# Patient Record
Sex: Female | Born: 1960 | Race: Black or African American | Hispanic: No | Smoking: Never smoker
Health system: Southern US, Community
[De-identification: ages and names within clinical notes are randomized; demographics above are authoritative.]

## PROBLEM LIST (undated history)

## (undated) DIAGNOSIS — K219 Gastro-esophageal reflux disease without esophagitis: Secondary | ICD-10-CM

## (undated) DIAGNOSIS — F329 Major depressive disorder, single episode, unspecified: Secondary | ICD-10-CM

## (undated) DIAGNOSIS — M109 Gout, unspecified: Secondary | ICD-10-CM

## (undated) DIAGNOSIS — C649 Malignant neoplasm of unspecified kidney, except renal pelvis: Secondary | ICD-10-CM

## (undated) DIAGNOSIS — N289 Disorder of kidney and ureter, unspecified: Secondary | ICD-10-CM

## (undated) DIAGNOSIS — E119 Type 2 diabetes mellitus without complications: Secondary | ICD-10-CM

## (undated) DIAGNOSIS — F32A Depression, unspecified: Secondary | ICD-10-CM

## (undated) DIAGNOSIS — M1990 Unspecified osteoarthritis, unspecified site: Secondary | ICD-10-CM

## (undated) DIAGNOSIS — F419 Anxiety disorder, unspecified: Secondary | ICD-10-CM

## (undated) DIAGNOSIS — R011 Cardiac murmur, unspecified: Secondary | ICD-10-CM

## (undated) DIAGNOSIS — I10 Essential (primary) hypertension: Secondary | ICD-10-CM

## (undated) HISTORY — PX: GASTRIC BYPASS: SHX52

## (undated) HISTORY — DX: Disorder of kidney and ureter, unspecified: N28.9

## (undated) HISTORY — DX: Gout, unspecified: M10.9

## (undated) HISTORY — PX: TENDON REPAIR: SHX5111

## (undated) HISTORY — DX: Malignant neoplasm of unspecified kidney, except renal pelvis: C64.9

---

## 2000-05-10 ENCOUNTER — Encounter: Admission: RE | Admit: 2000-05-10 | Discharge: 2000-05-10 | Payer: Self-pay | Admitting: Pulmonary Disease

## 2000-05-13 ENCOUNTER — Encounter: Admission: RE | Admit: 2000-05-13 | Discharge: 2000-05-13 | Payer: Self-pay | Admitting: Pulmonary Disease

## 2000-07-25 ENCOUNTER — Encounter: Payer: Self-pay | Admitting: Otolaryngology

## 2000-07-25 ENCOUNTER — Ambulatory Visit (HOSPITAL_COMMUNITY): Admission: RE | Admit: 2000-07-25 | Discharge: 2000-07-25 | Payer: Self-pay | Admitting: Otolaryngology

## 2001-01-21 ENCOUNTER — Emergency Department (HOSPITAL_COMMUNITY): Admission: EM | Admit: 2001-01-21 | Discharge: 2001-01-21 | Payer: Self-pay | Admitting: *Deleted

## 2001-01-21 ENCOUNTER — Encounter: Payer: Self-pay | Admitting: *Deleted

## 2001-09-10 ENCOUNTER — Ambulatory Visit (HOSPITAL_COMMUNITY): Admission: RE | Admit: 2001-09-10 | Discharge: 2001-09-10 | Payer: Self-pay | Admitting: Pulmonary Disease

## 2001-12-22 ENCOUNTER — Encounter: Admission: RE | Admit: 2001-12-22 | Discharge: 2002-03-22 | Payer: Self-pay | Admitting: Pulmonary Disease

## 2002-02-08 ENCOUNTER — Emergency Department (HOSPITAL_COMMUNITY): Admission: EM | Admit: 2002-02-08 | Discharge: 2002-02-08 | Payer: Self-pay | Admitting: Internal Medicine

## 2002-02-08 ENCOUNTER — Encounter: Payer: Self-pay | Admitting: Internal Medicine

## 2002-05-09 ENCOUNTER — Encounter: Admission: RE | Admit: 2002-05-09 | Discharge: 2002-05-09 | Payer: Self-pay | Admitting: Pulmonary Disease

## 2002-06-20 ENCOUNTER — Emergency Department (HOSPITAL_COMMUNITY): Admission: EM | Admit: 2002-06-20 | Discharge: 2002-06-21 | Payer: Self-pay | Admitting: Emergency Medicine

## 2002-11-05 ENCOUNTER — Ambulatory Visit (HOSPITAL_COMMUNITY): Admission: RE | Admit: 2002-11-05 | Discharge: 2002-11-05 | Payer: Self-pay | Admitting: Pulmonary Disease

## 2003-11-30 ENCOUNTER — Emergency Department (HOSPITAL_COMMUNITY): Admission: EM | Admit: 2003-11-30 | Discharge: 2003-11-30 | Payer: Self-pay | Admitting: Emergency Medicine

## 2004-03-18 ENCOUNTER — Emergency Department (HOSPITAL_COMMUNITY): Admission: EM | Admit: 2004-03-18 | Discharge: 2004-03-18 | Payer: Self-pay | Admitting: Family Medicine

## 2004-03-22 ENCOUNTER — Emergency Department (HOSPITAL_COMMUNITY): Admission: EM | Admit: 2004-03-22 | Discharge: 2004-03-22 | Payer: Self-pay | Admitting: Family Medicine

## 2004-04-13 ENCOUNTER — Emergency Department (HOSPITAL_COMMUNITY): Admission: EM | Admit: 2004-04-13 | Discharge: 2004-04-13 | Payer: Self-pay | Admitting: Family Medicine

## 2004-04-18 ENCOUNTER — Ambulatory Visit (HOSPITAL_COMMUNITY): Admission: RE | Admit: 2004-04-18 | Discharge: 2004-04-18 | Payer: Self-pay | Admitting: Internal Medicine

## 2004-04-19 ENCOUNTER — Ambulatory Visit (HOSPITAL_COMMUNITY): Admission: RE | Admit: 2004-04-19 | Discharge: 2004-04-19 | Payer: Self-pay | Admitting: Internal Medicine

## 2004-05-02 ENCOUNTER — Other Ambulatory Visit: Admission: RE | Admit: 2004-05-02 | Discharge: 2004-05-02 | Payer: Self-pay | Admitting: Obstetrics and Gynecology

## 2004-05-08 ENCOUNTER — Ambulatory Visit (HOSPITAL_COMMUNITY): Admission: RE | Admit: 2004-05-08 | Discharge: 2004-05-08 | Payer: Self-pay | Admitting: Internal Medicine

## 2004-05-10 ENCOUNTER — Ambulatory Visit: Payer: Self-pay | Admitting: Orthopedic Surgery

## 2004-05-11 ENCOUNTER — Ambulatory Visit (HOSPITAL_COMMUNITY): Admission: RE | Admit: 2004-05-11 | Discharge: 2004-05-11 | Payer: Self-pay | Admitting: Obstetrics and Gynecology

## 2004-06-12 ENCOUNTER — Ambulatory Visit: Payer: Self-pay | Admitting: Orthopedic Surgery

## 2004-06-28 ENCOUNTER — Ambulatory Visit: Payer: Self-pay | Admitting: Orthopedic Surgery

## 2004-07-24 ENCOUNTER — Encounter: Admission: RE | Admit: 2004-07-24 | Discharge: 2004-08-17 | Payer: Self-pay | Admitting: Orthopedic Surgery

## 2004-11-03 ENCOUNTER — Ambulatory Visit (HOSPITAL_COMMUNITY): Payer: Self-pay | Admitting: Oncology

## 2004-11-03 ENCOUNTER — Encounter: Admission: RE | Admit: 2004-11-03 | Discharge: 2004-11-03 | Payer: Self-pay | Admitting: Oncology

## 2004-11-03 ENCOUNTER — Encounter (HOSPITAL_COMMUNITY): Admission: RE | Admit: 2004-11-03 | Discharge: 2004-12-03 | Payer: Self-pay | Admitting: Oncology

## 2005-02-14 ENCOUNTER — Emergency Department (HOSPITAL_COMMUNITY): Admission: EM | Admit: 2005-02-14 | Discharge: 2005-02-14 | Payer: Self-pay | Admitting: Family Medicine

## 2005-05-11 ENCOUNTER — Encounter (HOSPITAL_COMMUNITY): Admission: RE | Admit: 2005-05-11 | Discharge: 2005-06-10 | Payer: Self-pay | Admitting: Oncology

## 2005-05-11 ENCOUNTER — Ambulatory Visit (HOSPITAL_COMMUNITY): Payer: Self-pay | Admitting: Oncology

## 2005-05-11 ENCOUNTER — Encounter: Admission: RE | Admit: 2005-05-11 | Discharge: 2005-05-11 | Payer: Self-pay | Admitting: Oncology

## 2005-05-21 ENCOUNTER — Other Ambulatory Visit: Admission: RE | Admit: 2005-05-21 | Discharge: 2005-05-21 | Payer: Self-pay | Admitting: Obstetrics and Gynecology

## 2005-05-22 ENCOUNTER — Ambulatory Visit (HOSPITAL_COMMUNITY): Admission: RE | Admit: 2005-05-22 | Discharge: 2005-05-22 | Payer: Self-pay | Admitting: Family Medicine

## 2005-06-15 ENCOUNTER — Encounter (INDEPENDENT_AMBULATORY_CARE_PROVIDER_SITE_OTHER): Payer: Self-pay | Admitting: *Deleted

## 2005-06-15 ENCOUNTER — Ambulatory Visit (HOSPITAL_COMMUNITY): Admission: RE | Admit: 2005-06-15 | Discharge: 2005-06-15 | Payer: Self-pay | Admitting: Obstetrics and Gynecology

## 2006-06-05 ENCOUNTER — Emergency Department (HOSPITAL_COMMUNITY): Admission: EM | Admit: 2006-06-05 | Discharge: 2006-06-05 | Payer: Self-pay | Admitting: Family Medicine

## 2006-09-19 ENCOUNTER — Emergency Department (HOSPITAL_COMMUNITY): Admission: EM | Admit: 2006-09-19 | Discharge: 2006-09-19 | Payer: Self-pay | Admitting: Family Medicine

## 2006-12-02 ENCOUNTER — Emergency Department (HOSPITAL_COMMUNITY): Admission: EM | Admit: 2006-12-02 | Discharge: 2006-12-02 | Payer: Self-pay | Admitting: Emergency Medicine

## 2007-02-05 ENCOUNTER — Ambulatory Visit: Payer: Self-pay | Admitting: Cardiology

## 2007-02-18 ENCOUNTER — Encounter: Payer: Self-pay | Admitting: Cardiology

## 2007-02-18 ENCOUNTER — Ambulatory Visit: Payer: Self-pay

## 2007-02-20 ENCOUNTER — Ambulatory Visit: Payer: Self-pay

## 2007-02-24 ENCOUNTER — Ambulatory Visit: Payer: Self-pay | Admitting: Cardiology

## 2007-06-21 ENCOUNTER — Emergency Department (HOSPITAL_COMMUNITY): Admission: EM | Admit: 2007-06-21 | Discharge: 2007-06-21 | Payer: Self-pay | Admitting: Family Medicine

## 2008-05-11 ENCOUNTER — Ambulatory Visit (HOSPITAL_COMMUNITY): Admission: RE | Admit: 2008-05-11 | Discharge: 2008-05-11 | Payer: Self-pay | Admitting: Family Medicine

## 2009-01-24 ENCOUNTER — Ambulatory Visit (HOSPITAL_COMMUNITY): Admission: RE | Admit: 2009-01-24 | Discharge: 2009-01-24 | Payer: Self-pay | Admitting: Family Medicine

## 2009-08-09 ENCOUNTER — Ambulatory Visit (HOSPITAL_COMMUNITY): Admission: RE | Admit: 2009-08-09 | Discharge: 2009-08-09 | Payer: Self-pay | Admitting: Family Medicine

## 2009-08-12 ENCOUNTER — Emergency Department (HOSPITAL_COMMUNITY): Admission: EM | Admit: 2009-08-12 | Discharge: 2009-08-12 | Payer: Self-pay | Admitting: Emergency Medicine

## 2009-11-15 ENCOUNTER — Ambulatory Visit (HOSPITAL_COMMUNITY): Admission: RE | Admit: 2009-11-15 | Discharge: 2009-11-15 | Payer: Self-pay | Admitting: Family Medicine

## 2009-11-17 ENCOUNTER — Ambulatory Visit (HOSPITAL_COMMUNITY): Admission: RE | Admit: 2009-11-17 | Discharge: 2009-11-17 | Payer: Self-pay | Admitting: Family Medicine

## 2010-01-22 ENCOUNTER — Emergency Department (HOSPITAL_COMMUNITY): Admission: EM | Admit: 2010-01-22 | Discharge: 2010-01-22 | Payer: Self-pay | Admitting: Family Medicine

## 2010-04-22 ENCOUNTER — Encounter: Payer: Self-pay | Admitting: Internal Medicine

## 2010-04-23 ENCOUNTER — Encounter: Payer: Self-pay | Admitting: Family Medicine

## 2010-08-15 NOTE — Assessment & Plan Note (Signed)
[facility]                            CARDIOLOGY OFFICE NOTE   NAME:April Ward, April Ward                      MRN:          [id number]  DATE:02/24/2007                            DOB:          11/16/1960    PRIMARY CARE PHYSICIAN:  Annia Friendly. Loleta Chance, M.D.   REASON FOR VISIT:  Follow-up cardiac testing.   HISTORY OF PRESENT ILLNESS:  I saw this patient back in early November.  She was referred at that time with an abnormal electrocardiogram and had  other cardiac risk factors including obesity, hypertension, and type 2  diabetes mellitus.  I referred her for baseline ischemic evaluation  including an Adenosine Myoview which demonstrated no active ischemia  with soft tissue attenuation and normal ejection fraction of 61%.  She  also had an echocardiogram in the presence of a cardiac murmur  demonstrating normal left ventricular systolic function with some  dynamic left ventricular outflow tract obstruction with Valsalva likely  contributing to her murmur as well as mild calcification of the aortic  valve, although without any stenosis.  I reviewed these studies with her  today.  She is not reporting any active symptoms of chest pain or  breathlessness and risk factor modification makes the most sense at this  time.   ALLERGIES:  SCOLAXIN.   CURRENT MEDICATIONS:  1. Glyburide Metformin 5/500 mg one tablet p.o. b.i.d.  2. Prilosec 20 mg p.o. daily.  3. Tekturna 300/12.5 mg p.o. daily.  4. Benicar HCT 40/25 mg p.o. daily.  5. Aspirin 81 mg p.o. daily.   REVIEW OF SYSTEMS:  As described in history of present illness.  Otherwise negative.   PHYSICAL EXAMINATION:   PHYSICAL EXAMINATION:  VITAL SIGNS:  Blood pressure 184/96, heart rate  75, weight is 317 pounds.  GENERAL:  This is a morbidly obese woman in no acute distress.  NECK:  No elevated jugular venous pressure.  LUNGS:  Clear.  HEART:  Regular rate and rhythm with 2/6 systolic murmur at right  base.  Preserved S2.  No S3 gallop.  EXTREMITIES:  No pitting edema.   IMPRESSION:  Recent reassuring cardiac testing including no active  ischemia by Myoview and normal left ventricular systolic function.  Her  cardiac murmur is likely explained by some dynamic left ventricular  outflow tract obstruction and also a mild degree of calcification of the  aortic valve, although, without any significant aortic stenosis.  This  can be followed clinically.  Her abnormal electrocardiogram is likely  nonspecific and she does not have frank evidence of anterior scar.  At  this point I would recommend aggressive risk factor modification  including better blood pressure control.  She will follow up with Dr.  Loleta Chance and we can certainly see her back as needed.     Jonelle Sidle, MD  Electronically Signed    SGM/MedQ  DD: 02/24/2007  DT: 02/24/2007  Job #: [id number]   cc:   Annia Friendly. Loleta Chance, MD

## 2010-08-15 NOTE — Assessment & Plan Note (Signed)
[facility]                            CARDIOLOGY OFFICE NOTE   NAME:April Ward, April Ward                      MRN:          [id number]  DATE:02/05/2007                            DOB:          1960/05/06    REQUESTING PHYSICIAN:  Dr. Mirna Mires.   REASON FOR CONSULTATION:  Abnormal electrocardiogram.   HISTORY OF PRESENT ILLNESS:  [name] is a pleasant 50 year old woman  with history of obesity, hypertension, and type-2 diabetes mellitus. She  states that she has been undergoing medication adjustments for her blood  pressure per Dr. Loleta Chance. Back on September 1st, she reportedly went to an  urgent care facility with elevated blood pressure, and at that time, she  had an electrocardiogram obtained that was abnormal. I have a tracing  dated September 1, which shows sinus rhythm with poor anterior R-wave  progression, raising the possibility of a prior anterior wall  infarction. The patient tells me now that she was not having any  symptoms of chest pain at that time, although she was referred to the  emergency department for further evaluation. Available lab work  indicates a normal troponin-I level and a normal CK-NB level from that  visit. She was subsequently seen by Dr. Loleta Chance, and referred to [place] today  to discuss the situation further.   [name] denies having any exertional chest pain or breathlessness.  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  Her electrocardiogram shows sinus rhythm with a prolonged PR interval of  210 milliseconds and poor R-wave progression anteriorly as noted  previously. She otherwise has non-specific T-wave changes. [name]  has not undergone any prior stress testing. She does report some family  history of cardiovascular disease stating that her mother underwent  bypass surgery in her 57s, and that her father died of a heart attack at  age 79.   ALLERGIES:  SKELAXIN.   CURRENT  MEDICATIONS:  1. Glyburide.  2. Metformin 5/500 mg 1 tablet b.i.d.  3. Omeprazole 20 mg daily.  4. Tekturna 300/12.5 mg daily.  5. Benicar hydrochlorothiazide 40/25 mg daily.  6. Aspirin 81 mg daily.   PAST MEDICAL HISTORY:  As outlined above. The patient reports having a  fairly long standing history of hypertension and type-2 diabetes  mellitus. She has never been on any medications for cholesterol. She has  had two prior Caesarean sections in 1984 and in 1993.   FAMILY HISTORY:  As reviewed above.   REVIEW OF SYSTEMS:  As described in the history of present illness. The  patient reports problems with seasonal allergies. She has had a previous  diagnosis of anemia. She does report reflux symptoms. Also, arthritic  pain. No palpitations or syncope. No orthopnea or PND.   SOCIAL HISTORY:  The patient is divorced and has two children. She is an  LPN. She denies any alcohol or tobacco use history. She drinks 1 or 2  caffeinated beverages a day. She does not exercise regularly.   PHYSICAL EXAMINATION:  VITAL SIGNS:  Weight 314 pounds, blood pressure  180/106, recheck  at 170/94. Heart rate 68.  GENERAL:  This is a morbidly obese woman in no acute distress without  any active symptoms of chest pain or breathlessness.  HEENT:  Conjunctivae and lids normal. Oropharynx is clear.  NECK:  Supple. No elevated jugular venous pressure or loud bruits. No  thyromegaly is noted.  LUNGS:  Clear without labored breathing at rest.  CARDIAC:  Regular rate and rhythm. There is a 2/6 systolic murmur noted  at the base. Second heart sound is preserved. No S3 gallop or  pericardial rub.  ABDOMEN:  Obese. I am unable to adequately palpate liver margin. Bowel  sounds are present. No tenderness noted.  EXTREMITIES:  Exhibit no pitting edema. Distal pulses are 1+.  SKIN:  Warm and dry.  MUSCULOSKELETAL:  No kyphosis is noted.  NEUROPSYCHIATRIC:  The patient is alert and oriented x3. Affect is   normal.   IMPRESSION/RECOMMENDATIONS:  1. Abnormal electrocardiogram with poor R-wave progression raising the      possibility of previous anterior wall infarction in a 50 year old      morbidly obese woman with hypertension, type-2 diabetes mellitus,      and some family history of cardiovascular disease. This is in the      absence, however, of any active chest pain or breathlessness. Her      abnormal EKG may be due to body habitus. We discussed these issues      today and I have recommended further risk stratification including      an echocardiogram, particularly in light of her cardiac murmur to      assess valvular function, as well as cardiac structure. We will      proceed with a two day adenosine Myoview as well to assess her      ischemia and/or scar. I will have her follow up in the office over      the next few weeks to discuss the results.  2. Further plan is to follow up.     Jonelle Sidle, MD  Electronically Signed    SGM/MedQ  DD: 02/05/2007  DT: 02/06/2007  Job #: [id number]   cc:   Annia Friendly. Loleta Chance, MD

## 2010-08-18 NOTE — Op Note (Signed)
NAME:  April Ward, April Ward               ACCOUNT NO.:  [id number]   MEDICAL RECORD NO.:  [id number]          PATIENT TYPE:  AMB   LOCATION:  SDC                           FACILITY:  [facility]   PHYSICIAN:  Naima A. Dillard, M.D. DATE OF BIRTH:  October 13, 1960   DATE OF PROCEDURE:  06/15/2005  DATE OF DISCHARGE:                                 OPERATIVE REPORT   PREOPERATIVE DIAGNOSIS:  Menorrhagia and anemia.   POSTOPERATIVE DIAGNOSIS:  Menorrhagia and anemia.   OPERATION/PROCEDURE:  1.  Dilatation and curettage and hysteroscopy.  2.  NovaSure ablation.   SURGEON:  Naima A. Normand Sloop, M.D.   ANESTHESIA:  General laryngeal masked airway.   SPECIMENS:  Endometrial curettings.   ESTIMATED BLOOD LOSS:  Minimal.   COMPLICATIONS:  None.   DISPOSITION:  The patient went to PACU in stable condition.   DESCRIPTION OF PROCEDURE:  The patient was taken to the operating room where  she was given general anesthesia with laryngeal masked airway, placed in the  dorsal lithotomy position and prepped and draped in the normal sterile  fashion.  A bivalve speculum was placed into the vagina.  The anterior lip  of the cervix grasped with a single-tooth tenaculum and 20 mL 1% lidocaine  was used for local anesthesia for cervical block.  The cervical length was  found to be 4.5 cm.  The sounding length was 10 cm given the cavity length  of 5.5 cm.  Cervix was further dilated with Shawnie Pons dilators which was 21.  The hysteroscope was placed into the uterine cavity.  There were no polyps  or submucosal fibroids noted on the cervix.  Lower uterine segments were  anywhere  in the uterus. Both ostial were visualized.  The patient just had  abundant fluffy endometria.  The hysteroscope was removed.  A sharp  curettage was then done and an abundant  amount of endometrial curettings  were obtained and sent to pathology.  The NovaSure was then placed into the  uterus and seated.  The cavity was width was set at 5.5.  It was  seated.  The width was 3.5 cm.  The seal was then checked and noted to be secure.  The NovaSure ablation lasted for one minute 45 seconds.  The NovaSure was  removed.  The hysteroscope was replaced into the uterine cavity and the  ablation  appeared successful.  All instruments were removed from the vagina and  cervix.  There were some bleeding from the patient's right side tenaculum  site which was made hemostatic with pressure and silver nitrate.  Sponge,  lap and needle counts were correct x2.  The patient went to the recovery  room in stable condition.      Naima A. Normand Sloop, M.D.  Electronically Signed     NAD/MEDQ  D:  06/15/2005  T:  06/16/2005  Job:  [id number]

## 2010-08-18 NOTE — H&P (Signed)
NAME:  April Ward, April Ward               ACCOUNT NO.:  [id number]   MEDICAL RECORD NO.:  [id number]          PATIENT TYPE:  AMB   LOCATION:  SDC                           FACILITY:  [facility]   PHYSICIAN:  Naima A. Dillard, M.D. DATE OF BIRTH:  1960-07-12   DATE OF ADMISSION:  DATE OF DISCHARGE:                                HISTORY & PHYSICAL   DIAGNOSIS:  Menorrhagia.   HISTORY OF PRESENT ILLNESS:  The patient is a 50 year old African American  female who presents complaining of heavy periods.  Menarche started at age  38. Her periods come every month and they last for four to five days.  She  soaks a pad every hour on the first couple of days.  She did not have any  chest pain or dizziness. The patient had a hemoglobin of 9.5.  hemoglobin  electrophoresis was normal and the hemoccult was also normal.  The patient  denies being on any contraception.  She denies any history of fibroids or  hormone therapy.  She is not on any new medications.  Denies any vaginal  discharge, menopausal symptoms, abdominal pain, or any increased stress.   PAST MEDICAL HISTORY:  1.  Microcytic anemia.  2.  Diabetes mellitus.  3.  Obesity.  4.  Gastroesophageal reflux disease.  5.  Chronic hypertension.   MEDICATIONS:  1.  Glucovance.  2.  Calan.  3.  Labetalol.   PAST SURGICAL HISTORY:  1.  Cesarean section x1.  2.  Tubal ligation.   GYN HISTORY:  Menarche occurred at age 62, occurring every month, lasting  for four to five days.  The patient denies any history of abnormal Pap  smear.  Denies any history of sexually transmitted diseases.   SOCIAL HISTORY:  Negative for tobacco, alcohol or drug use.  She does  exercise occasionally.  The patient has complained of this menorrhagia since  January 2006.   ALLERGIES:  SKELAXIN, FLEXERIL.   REVIEW OF SYSTEMS:  GENITOURINARY:  As above.  MUSCULOSKELETAL:  Unremarkable.  GI: Significant for gastroesophageal reflux.  CARDIOVASCULAR:  Significant for  hypertension.  PSYCHIATRIC:  Unremarkable.   PHYSICAL EXAMINATION:  VITAL SIGNS:  Blood pressure 150/82.  She weighs 302  pounds.  HEENT:  Pupils are equal.  Hearing is normal.  Throat is clear.  NECK:  Thyroid is not enlarged.  HEART:  Regular rate and rhythm.  CHEST:  Clear to auscultation bilaterally.  BREASTS:  No masses, discharge, skin changes, or nipple retraction  bilaterally.  ABDOMEN:  Obese, soft, nontender.  EXTREMITIES:  No cyanosis, clubbing or edema.  NEUROLOGIC:  Within normal limits.  Vaginal exam:  Within normal limits.  Cervix is nontender without any  lesions.  Uterus is difficult to tell secondary body habitus.  Adnexa have  no masses and nontender.  Rectovaginal exam within normal limits.   LABORATORY DATA:  Hemoglobin 9.5 in the office.   ASSESSMENT:  Menorrhagia.  All treatments were reviewed with the patient in  detail.  For menorrhagia, the patient has decided to go with [facility] and  hysteroscopy and NovaSure ablation.  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  The  patient understands the risks, and assessment is menorrhagia.   PLAN:  D&C, hysteroscopy and NovaSure ablation.  The patient understands the  risks to be not limited to bleeding, infection, damage to internal organs  such as bowel, bladder and major blood vessels.      Naima A. Normand Sloop, M.D.  Electronically Signed     NAD/MEDQ  D:  06/14/2005  T:  06/15/2005  Job:  [id number]

## 2011-01-12 LAB — POCT CARDIAC MARKERS
CKMB, poc: 2.8
Myoglobin, poc: 156
Operator id: 270111
Troponin i, poc: <0.05

## 2011-02-08 ENCOUNTER — Other Ambulatory Visit (HOSPITAL_COMMUNITY): Payer: Self-pay | Admitting: Family Medicine

## 2011-02-08 DIAGNOSIS — Z1231 Encounter for screening mammogram for malignant neoplasm of breast: Secondary | ICD-10-CM

## 2011-03-12 ENCOUNTER — Ambulatory Visit (HOSPITAL_COMMUNITY): Payer: BC Managed Care – PPO | Attending: Family Medicine

## 2011-12-17 ENCOUNTER — Other Ambulatory Visit (HOSPITAL_COMMUNITY): Payer: Self-pay | Admitting: Family Medicine

## 2011-12-17 DIAGNOSIS — Z1231 Encounter for screening mammogram for malignant neoplasm of breast: Secondary | ICD-10-CM

## 2011-12-17 DIAGNOSIS — Z139 Encounter for screening, unspecified: Secondary | ICD-10-CM

## 2011-12-18 ENCOUNTER — Ambulatory Visit (HOSPITAL_COMMUNITY)
Admission: RE | Admit: 2011-12-18 | Discharge: 2011-12-18 | Disposition: A | Discharge status: Home / Self Care | Payer: BC Managed Care – PPO | Source: Ambulatory Visit | Attending: Family Medicine | Admitting: Family Medicine

## 2011-12-18 DIAGNOSIS — Z139 Encounter for screening, unspecified: Secondary | ICD-10-CM

## 2011-12-18 DIAGNOSIS — Z1231 Encounter for screening mammogram for malignant neoplasm of breast: Secondary | ICD-10-CM | POA: Insufficient documentation

## 2012-01-01 ENCOUNTER — Ambulatory Visit (HOSPITAL_COMMUNITY): Payer: Self-pay

## 2012-05-06 ENCOUNTER — Ambulatory Visit (HOSPITAL_COMMUNITY)
Admission: RE | Admit: 2012-05-06 | Discharge: 2012-05-06 | Disposition: A | Discharge status: Home / Self Care | Payer: BC Managed Care – PPO | Source: Ambulatory Visit | Attending: Family Medicine | Admitting: Family Medicine

## 2012-05-06 ENCOUNTER — Other Ambulatory Visit (HOSPITAL_COMMUNITY): Payer: Self-pay | Admitting: Family Medicine

## 2012-05-06 DIAGNOSIS — M25561 Pain in right knee: Secondary | ICD-10-CM

## 2012-05-06 DIAGNOSIS — W19XXXA Unspecified fall, initial encounter: Secondary | ICD-10-CM

## 2012-05-06 DIAGNOSIS — S8990XA Unspecified injury of unspecified lower leg, initial encounter: Secondary | ICD-10-CM | POA: Insufficient documentation

## 2012-05-06 DIAGNOSIS — M898X9 Other specified disorders of bone, unspecified site: Secondary | ICD-10-CM | POA: Insufficient documentation

## 2012-05-06 DIAGNOSIS — S99929A Unspecified injury of unspecified foot, initial encounter: Secondary | ICD-10-CM | POA: Insufficient documentation

## 2012-05-06 DIAGNOSIS — X58XXXA Exposure to other specified factors, initial encounter: Secondary | ICD-10-CM | POA: Insufficient documentation

## 2012-05-06 DIAGNOSIS — M25569 Pain in unspecified knee: Secondary | ICD-10-CM | POA: Insufficient documentation

## 2012-07-13 ENCOUNTER — Emergency Department (HOSPITAL_COMMUNITY)
Admission: EM | Admit: 2012-07-13 | Discharge: 2012-07-13 | Disposition: A | Discharge status: Home / Self Care | Payer: BC Managed Care – PPO | Attending: Emergency Medicine | Admitting: Emergency Medicine

## 2012-07-13 ENCOUNTER — Encounter (HOSPITAL_COMMUNITY): Payer: Self-pay | Admitting: Emergency Medicine

## 2012-07-13 DIAGNOSIS — Z79899 Other long term (current) drug therapy: Secondary | ICD-10-CM | POA: Insufficient documentation

## 2012-07-13 DIAGNOSIS — E119 Type 2 diabetes mellitus without complications: Secondary | ICD-10-CM | POA: Insufficient documentation

## 2012-07-13 DIAGNOSIS — I10 Essential (primary) hypertension: Secondary | ICD-10-CM | POA: Insufficient documentation

## 2012-07-13 DIAGNOSIS — M25569 Pain in unspecified knee: Secondary | ICD-10-CM | POA: Insufficient documentation

## 2012-07-13 DIAGNOSIS — Z87828 Personal history of other (healed) physical injury and trauma: Secondary | ICD-10-CM | POA: Insufficient documentation

## 2012-07-13 DIAGNOSIS — M25561 Pain in right knee: Secondary | ICD-10-CM

## 2012-07-13 HISTORY — DX: Type 2 diabetes mellitus without complications: E11.9

## 2012-07-13 HISTORY — DX: Essential (primary) hypertension: I10

## 2012-07-13 MED ORDER — OXYCODONE-ACETAMINOPHEN 5-325 MG PO TABS
2.0000 | ORAL_TABLET | Freq: Once | ORAL | Status: AC
  Administered 2012-07-13: 2 via ORAL
  Filled 2012-07-13: qty 2

## 2012-07-13 MED ORDER — OXYCODONE-ACETAMINOPHEN 5-325 MG PO TABS: 2.0000 | ORAL_TABLET | ORAL | Status: DC | PRN

## 2012-07-13 NOTE — ED Provider Notes (Signed)
History     CSN: [id number]  Arrival date & time 07/13/12  4098   First MD Initiated Contact with Patient 07/13/12 [phone]      Chief Complaint  Patient presents with  . Knee Pain    (Consider location/radiation/quality/duration/timing/severity/associated sxs/prior treatment) HPI Comments: Patient injured right knee two months ago doing Zumba.  Had xrays which showed osteophyte formation.  Didn't get better, had mri last week and hasn't received the results.  Pain worsening.  Patient is a 52 y.o. female presenting with knee pain. The history is provided by the patient.  Knee Pain Location:  Knee Time since incident:  2 months Pain details:    Quality:  Aching   Radiates to:  Does not radiate   Severity:  Severe   Onset quality:  Sudden   Duration:  2 months   Timing:  Constant   Progression:  Worsening Chronicity:  New   Past Medical History  Diagnosis Date  . Hypertension   . Diabetes mellitus without complication     Past Surgical History  Procedure Laterality Date  . Cesarean section  1993    History reviewed. No pertinent family history.  History  Substance Use Topics  . Smoking status: Never Smoker   . Smokeless tobacco: Not on file  . Alcohol Use: No    OB History   Grav Para Term Preterm Abortions TAB SAB Ect Mult Living                  Review of Systems  All other systems reviewed and are negative.    Allergies  Skelaxin  Home Medications   Current Outpatient Rx  Name  Route  Sig  Dispense  Refill  . aliskiren (TEKTURNA) 300 MG tablet   Oral   Take 300 mg by mouth daily.         . metFORMIN (GLUCOPHAGE) 500 MG tablet   Oral   Take 500 mg by mouth once.           BP 164/87  Pulse 58  Temp(Src) 97.8 F (36.6 C) (Oral)  Resp 18  SpO2 100%  Physical Exam  Nursing note and vitals reviewed. Constitutional: She is oriented to person, place, and time. She appears well-developed and well-nourished. No distress.  HENT:  Head:  Normocephalic and atraumatic.  Mouth/Throat: Oropharynx is clear and moist.  Neck: Normal range of motion. Neck supple.  Musculoskeletal:  The right knee appears grossly normal.  There is no effusion or deformity.  There is pain with range of motion.  The knee is stable ap and laterally.  Neurological: She is alert and oriented to person, place, and time.  Skin: Skin is warm and dry. She is not diaphoretic.    ED Course  Procedures (including critical care time)  Labs Reviewed - No data to display No results found.   No diagnosis found.    MDM  Likely djd flareup.  Will prescribe percocet.  She has already had her mri and can call Monday for results.          Sudie Grumbling, MD 07/13/12 [phone]

## 2012-07-13 NOTE — ED Notes (Signed)
Pt complains of right knee pain, states she injured it while exercising back in feb of this year, has had xrays here and had an MRI Friday but still does not know what is causing the pain, states she can barely put weight on it. No obvious swelling or deformity noted.

## 2012-07-22 ENCOUNTER — Telehealth: Payer: Self-pay | Admitting: Orthopedic Surgery

## 2012-07-22 NOTE — Telephone Encounter (Signed)
Disregard message - testing MyChart appt reminder.

## 2012-07-23 ENCOUNTER — Encounter: Payer: Self-pay | Admitting: Orthopedic Surgery

## 2012-07-23 ENCOUNTER — Ambulatory Visit (INDEPENDENT_AMBULATORY_CARE_PROVIDER_SITE_OTHER): Payer: BC Managed Care – PPO | Admitting: Orthopedic Surgery

## 2012-07-23 VITALS — BP 206/102 | Ht 71.5 in | Wt 286.0 lb

## 2012-07-23 DIAGNOSIS — M1710 Unilateral primary osteoarthritis, unspecified knee: Secondary | ICD-10-CM

## 2012-07-23 DIAGNOSIS — M179 Osteoarthritis of knee, unspecified: Secondary | ICD-10-CM | POA: Insufficient documentation

## 2012-07-23 DIAGNOSIS — M23329 Other meniscus derangements, posterior horn of medial meniscus, unspecified knee: Secondary | ICD-10-CM

## 2012-07-23 DIAGNOSIS — M23321 Other meniscus derangements, posterior horn of medial meniscus, right knee: Secondary | ICD-10-CM

## 2012-07-23 DIAGNOSIS — IMO0002 Reserved for concepts with insufficient information to code with codable children: Secondary | ICD-10-CM

## 2012-07-23 MED ORDER — HYDROCODONE-ACETAMINOPHEN 7.5-325 MG PO TABS: 1.0000 | ORAL_TABLET | ORAL | Status: DC | PRN

## 2012-07-23 NOTE — Patient Instructions (Addendum)
Meniscus Injury of the Knee, Arthroscopy You may have an internal derangement of the knee. This means something is wrong inside the knee. Your caregiver can make a more accurate diagnosis (learning what is wrong) by performing an arthroscopic procedure. Your knee has two layers of cartilage. Articular cartilage covers the bone ends. It lets your knee bend and move smoothly. Two menisci (thick pads of cartilage that form a rim inside the joint) help absorb shock. They stabilize your knee. Ligaments bind the bones together. They support your knee joint. Muscles move the joint, help support your knee, and take stress off the joint itself.  ABOUT THE PROCEDURE Arthroscopy is a surgical technique. It allows your orthopedic surgeon to diagnose and treat your knee injury with accuracy. The surgeon looks into your knee through a small scope. The scope is like a small (pencil-sized) telescope. Arthroscopy is less invasive than open knee surgery. You can expect a more rapid recovery. Following your caregiver's instructions will help you recover rapidly and completely. Use crutches, rest, elevate, ice, and do knee exercises as instructed. The length of recovery depends on various factors. These factors include type of injury, age, physical condition, medical conditions, and your determination. How long you will be away from your normal activities will depend on what kind of knee problem you have. It will also depend on how much tissue is damaged. Rebuilding your muscles after arthroscopy helps ensure a full recovery. RECOVERY Recovery after a meniscus injury depends on how much meniscus is damaged. It also depends on whether or not you have damaged other knee tissue. With small tears, your recovery may take a couple weeks. Larger tears will take longer. Meniscus injuries can usually be treated during arthroscopy. If your injury is on the inner edge of the meniscus, your surgeon may trim the meniscus back to a smooth rim.  In other cases, your surgeon will try to repair a damaged meniscus with sutures (stitches). This may lengthen your rehabilitation. It may provide better long-term health by helping your knee retain its shock absorption abilities. Use crutches, limit weight bearing, rest, elevate, apply ice, and exercise your knee as instructed. If a brace is applied, use as directed. The length of recovery depends on various factors including type of injury, age, physical condition, other medical conditions, and your determination. Your caregiver will help with instructions for rehabilitation of your knee. HOME CARE INSTRUCTIONS  Use crutches and knee exercises as instructed.  Applying an ice pack to your operative site may help with discomfort. It may also keep the swelling down.  Only take over-the-counter or prescription medicines for pain, discomfort, inflammation (soreness)or fever as directed by your caregiver. You may use these only if your caregiver has not given medications that would interfere.  You may resume normal diet and activities as directed. SEEK MEDICAL ATTENTION IF:  There is increased bleeding (more than a small spot) from the wound.  You notice redness, swelling, or increasing pain in the wound.  Pus is coming from wound.  An unexplained oral temperature above 102 F (38.9 C) develops, or as your caregiver suggests.  You notice a foul smell coming from the wound or dressing. SEEK IMMEDIATE MEDICAL CARE IF:  You develop a rash.  You have difficulty breathing.  You have any allergic problems. Document Released: 03/16/2000 Document Revised: 06/11/2011 Document Reviewed: 06/02/2007 [facility] Patient Information 2013 [place], [place].  OOW  plan 1 week  sark med menisectomy pending BP normalizing   Arthroscopic Procedure, Knee An arthroscopic  procedure can find what is wrong with your knee. PROCEDURE Arthroscopy is a surgical technique that allows your orthopedic surgeon to  diagnose and treat your knee injury with accuracy. They will look into your knee through a small instrument. This is almost like a small (pencil sized) telescope. Because arthroscopy affects your knee less than open knee surgery, you can anticipate a more rapid recovery. Taking an active role by following your caregiver's instructions will help with rapid and complete recovery. Use crutches, rest, elevation, ice, and knee exercises as instructed. The length of recovery depends on various factors including type of injury, age, physical condition, medical conditions, and your rehabilitation. Your knee is the joint between the large bones (femur and tibia) in your leg. Cartilage covers these bone ends which are smooth and slippery and allow your knee to bend and move smoothly. Two menisci, thick, semi-lunar shaped pads of cartilage which form a rim inside the joint, help absorb shock and stabilize your knee. Ligaments bind the bones together and support your knee joint. Muscles move the joint, help support your knee, and take stress off the joint itself. Because of this all programs and physical therapy to rehabilitate an injured or repaired knee require rebuilding and strengthening your muscles. AFTER THE PROCEDURE  After the procedure, you will be moved to a recovery area until most of the effects of the medication have worn off. Your caregiver will discuss the test results with you.   Only take over-the-counter or prescription medicines for pain, discomfort, or fever as directed by your caregiver.    You have been scheduled for arthroscocpic knee surgery.  All surgeries carry some risk.  Remember you always have the option of continued nonsurgical treatment. However in this situation the risks vs. the benefits favor surgery as the best treatment option. The risks of the surgery includes the following but is not limited to bleeding, infection, pulmonary embolus, death from anesthesia, nerve injury vascular  injury or need for further surgery, continued pain.  Specific to this procedure the following risks and complications are rare but possible Stiffness, pain, weakness, giving out  I expect  recovery will be in 3-4 weeks some patients take 6 weeks.  You  will need physical therapy after the procedure  Stop any blood thinning medication: such as warfarin, coumadin, naprosyn, ibuprofen, advil, diclofenac, aspirin

## 2012-07-23 NOTE — Progress Notes (Signed)
Patient ID: April Ward, female   DOB: [birth year], 52 y.o.   MRN: [id number] Chief Complaint  Patient presents with  . Knee Pain    Severe right knee pain d/ t injury    BP 206/102  Ht 5' 11.5" (1.816 m)  Wt 286 lb (129.729 kg)  BMI 39.34 kg/m2  Patient history referral by Dr. Mirna Mires  52 year old female started having pain in her right knee in February 2014. She participates in some classes but doesn't recall a specific injury. She had some aches and pains in the knee prior to some classes and sometimes her knee would feel bad afterwards  She did take some pain medication from the emergency room after a visit related to her knee. Her x-ray show degenerative changes he eventually went to have MRI. She complains of sharp throbbing 10 out of 10 constant pain worse with twisting turning driving walking slightly improved with ice she notes swelling and giving way with crepitance  Review of systems negative except for the following morning of the eyes, heart murmur, snoring, unsteady gait, anxiety, seasonal allergy. 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  Ambulation altered by the pain in the right knee  Right knee  Inspection reveals that there is tenderness over the medial compartment crepitus in the patellofemoral joint with pain with patellar compression. Joint effusion mild. Range of motion remains normal. Ligaments are stable motor exam is normal skin is intact  Right knee McMurray sign positive Left knee Inspection reveals that there is no evidence of tenderness or swelling  no mass no effusion, range of motion remains full without contracture. No crepitation is felt. The collateral ligaments and cruciate ligaments are stable. Muscle strength and muscle tone are normal  Left knee McMurray sign negative Skin is normal in both knees without caf au lait spots scars or rashes  Deep tendon reflexes are 2+ without pathologic reflexes  Sensation remains normal bilaterally  Upper extremity exam  The right and left upper extremity:   Inspection and palpation revealed no abnormalities in the upper extremities.   Range of motion is full without contracture.  Motor exam is normal with grade 5 strength.  The joints are fully reduced without subluxation.  There is no atrophy or tremor and muscle tone is normal.  All joints are stable.   We took her blood pressure twice was elevated both time she says she did take her medication at night as normal  We called Dr. Adaline Sill office to get this evaluated prior to surgery  We have discussed and she is agreed to arthroscopic surgery of the right knee for partial medial meniscectomy  We discussed her being out of work for 4 weeks she says she can do work from home and can go to work and use a wheelchair for needed but she will agree to one week out of work  OA (osteoarthritis) of knee  Medial meniscus, posterior horn derangement, right  Surgical arthroscopy right knee partial medial meniscectomy after blood pressure has been stabilized

## 2012-08-05 ENCOUNTER — Telehealth: Payer: Self-pay | Admitting: Orthopedic Surgery

## 2012-08-05 NOTE — Telephone Encounter (Signed)
The swimming, water aerobics

## 2012-08-05 NOTE — Telephone Encounter (Signed)
April Ward is still seeing Dr. Loleta Chance for elevated blood pressure.  At her last visit you told her she needs knee surgery, but blood pressure was too high. She was doing Zumba but can't do that now and she is asking what type of exercises can she safely do that will not further injure her  Knee. She is wanting to do water aerobics if you think that will be ok.  Please advise.  Her # [phone]

## 2012-08-05 NOTE — Telephone Encounter (Signed)
Left a message to call back.

## 2012-08-05 NOTE — Telephone Encounter (Signed)
Advised of doctor's reply °

## 2012-08-06 ENCOUNTER — Other Ambulatory Visit: Payer: Self-pay | Admitting: Orthopedic Surgery

## 2012-08-13 ENCOUNTER — Telehealth: Payer: Self-pay | Admitting: Orthopedic Surgery

## 2012-08-13 NOTE — Telephone Encounter (Signed)
April Ward called this morning, said she has seen Dr. Loleta Chance and he has cleared her for surgery, and she is ready to schedule. I haven't seen anything from his office about this.  Have you? April Ward's # [phone]

## 2012-08-14 NOTE — Telephone Encounter (Signed)
Do you have Dr. Adaline Sill note clearing patient for surgery? Also, patient called and said her Insurance is changing June 1st, so she will need her surgery before then.

## 2012-08-14 NOTE — Telephone Encounter (Signed)
1. Call dr hills office need a note clearing for surgery  2 no guarantee if will be before June 1  3. precert for menisectomy (its already may 15) 4. Earliest we can do it is 23 rd if precert received

## 2012-08-15 ENCOUNTER — Other Ambulatory Visit: Payer: Self-pay | Admitting: *Deleted

## 2012-08-18 ENCOUNTER — Telehealth: Payer: Self-pay | Admitting: Orthopedic Surgery

## 2012-08-18 NOTE — Telephone Encounter (Signed)
Per nurse, surgery and pre-op appointment scheduled.  Refer to call to insurer 08/18/12 (no pre-authorization required). Patient aware of status.

## 2012-08-18 NOTE — Telephone Encounter (Signed)
Contacted insurer, [employer], ph# [phone], regarding out-patient surgery scheduled at [facility] 08/29/12, CPT 29881, 29880, ICD9 codes 717.2, 715.96. Per automated voice response syste, no pre-authorization required, confirmation reference # [id number]. Also verified coverage per automated system - effective 04/02/12 and active.  Also reached insurance representative, Bufford Lope, who confirmed no pre-authorization needed.

## 2012-08-19 ENCOUNTER — Other Ambulatory Visit: Payer: Self-pay

## 2012-08-19 ENCOUNTER — Ambulatory Visit (HOSPITAL_COMMUNITY)
Admission: RE | Admit: 2012-08-19 | Discharge: 2012-08-19 | Disposition: A | Discharge status: Home / Self Care | Payer: BC Managed Care – PPO | Source: Ambulatory Visit | Attending: Family Medicine | Admitting: Family Medicine

## 2012-08-19 ENCOUNTER — Encounter (HOSPITAL_COMMUNITY): Payer: Self-pay

## 2012-08-19 ENCOUNTER — Encounter (HOSPITAL_COMMUNITY): Payer: BC Managed Care – PPO

## 2012-08-19 ENCOUNTER — Encounter (HOSPITAL_COMMUNITY)
Admission: RE | Admit: 2012-08-19 | Discharge: 2012-08-19 | Disposition: A | Discharge status: Home / Self Care | Payer: BC Managed Care – PPO | Source: Ambulatory Visit | Attending: Orthopedic Surgery | Admitting: Orthopedic Surgery

## 2012-08-19 ENCOUNTER — Encounter (HOSPITAL_COMMUNITY): Payer: Self-pay | Admitting: Pharmacy Technician

## 2012-08-19 DIAGNOSIS — IMO0001 Reserved for inherently not codable concepts without codable children: Secondary | ICD-10-CM | POA: Insufficient documentation

## 2012-08-19 DIAGNOSIS — M25569 Pain in unspecified knee: Secondary | ICD-10-CM | POA: Insufficient documentation

## 2012-08-19 DIAGNOSIS — R269 Unspecified abnormalities of gait and mobility: Secondary | ICD-10-CM | POA: Insufficient documentation

## 2012-08-19 HISTORY — DX: Unspecified osteoarthritis, unspecified site: M19.90

## 2012-08-19 HISTORY — DX: Gastro-esophageal reflux disease without esophagitis: K21.9

## 2012-08-19 HISTORY — DX: Depression, unspecified: F32.A

## 2012-08-19 HISTORY — DX: Major depressive disorder, single episode, unspecified: F32.9

## 2012-08-19 HISTORY — DX: Cardiac murmur, unspecified: R01.1

## 2012-08-19 HISTORY — DX: Anxiety disorder, unspecified: F41.9

## 2012-08-19 LAB — BASIC METABOLIC PANEL
BUN: 29 mg/dL — ABNORMAL HIGH (ref 6–23)
CO2: 27 mEq/L (ref 19–32)
Calcium: 9.2 mg/dL (ref 8.4–10.5)
Chloride: 100 mEq/L (ref 96–112)
Creatinine, Ser: 2.34 mg/dL — ABNORMAL HIGH (ref 0.50–1.10)
GFR calc Af Amer: 27 mL/min — ABNORMAL LOW (ref >90)
GFR calc non Af Amer: 23 mL/min — ABNORMAL LOW (ref >90)
Glucose, Bld: 195 mg/dL — ABNORMAL HIGH (ref 70–99)
Potassium: 3.5 mEq/L (ref 3.5–5.1)
Sodium: 138 mEq/L (ref 135–145)

## 2012-08-19 LAB — SURGICAL PCR SCREEN
MRSA, PCR: NEGATIVE
Staphylococcus aureus: NEGATIVE

## 2012-08-19 LAB — HEMOGLOBIN AND HEMATOCRIT, BLOOD
HCT: 39 % (ref 36.0–46.0)
Hemoglobin: 12.5 g/dL (ref 12.0–15.0)

## 2012-08-19 NOTE — Progress Notes (Signed)
Physical Therapy Treatment Patient Details  Name: April Ward MRN: [id number] Date of Birth: September 16, 1960  Today's Date: 08/19/2012 Time: 1355-1420 PT Time Calculation (min): 25 min Visit#: 1 of 1  Gait 20 minutes  Subjective: Symptoms/Limitations Symptoms: Pt. states she is having a Rt knee scope done this friday. Pain Assessment Currently in Pain?: No/denies   Exercise/Treatments Mobility/Balance  Transfers Transfers: Sit to Stand;Stand to Sit Sit to Stand: 6: Modified independent (Device/Increase time) Stand to Sit: 6: Modified independent (Device/Increase time) Ambulation/Gait Ambulation/Gait: Yes Ambulation/Gait Assistance: 6: Modified independent (Device/Increase time) Ambulation Distance (Feet): 50 Feet Assistive device: Crutches Gait Pattern: Step-through pattern Gait velocity: slow, cautious Stairs: No (Pt instructed with stairs but did not perform)         Physical Therapy Assessment and Plan PT Assessment and Plan Clinical Impression Statement: crutches adjusted for height.  Pt .able to demonstrate appropriate gait technique and negotiation of crutches as instructed.   Pt declined trying steps as she does not have any, however was instructed in proper gait technique/sequence for 2 steps with 1 HR and 1 crutch.  Pt. able to verbalize understanding. PT Plan: Discharge; One treatment only for crutch training.     Problem List Patient Active Problem List   Diagnosis Date Noted  . OA (osteoarthritis) of knee 07/23/2012  . Medial meniscus, posterior horn derangement 07/23/2012    PT - End of Session Activity Tolerance: Patient tolerated treatment well General Behavior During Therapy: [facility] for tasks assessed/performed Cognition: WFL for tasks performed   Lurena Nida, PTA/CLT 08/19/2012, 2:38 PM

## 2012-08-19 NOTE — Progress Notes (Addendum)
08/19/12 1236  OBSTRUCTIVE SLEEP APNEA  Have you ever been diagnosed with sleep apnea through a sleep study? No  Do you snore loudly (loud enough to be heard through closed doors)?  1  Do you often feel tired, fatigued, or sleepy during the daytime? 0  Has anyone observed you stop breathing during your sleep? 0  Do you have, or are you being treated for high blood pressure? 1  BMI more than 35 kg/m2? 1  Age over 52 years old? 1  Neck circumference greater than 40 cm/18 inches? 0  Gender: 0  Obstructive Sleep Apnea Score 4  Copy given to patient to give to her primary care doctor.

## 2012-08-19 NOTE — Telephone Encounter (Signed)
As of 08/18/12, as noted, patient's surgery date is now scheduled for 08/22/12, and no pre-authorization is required by patient's insurer, [employer] per previous note and call to insurer.

## 2012-08-19 NOTE — Progress Notes (Signed)
Crutches given to patient. Patient to come back to PT for training at 1300.

## 2012-08-19 NOTE — Patient Instructions (Addendum)
ZANYLAH HARDIE  08/19/2012   Your procedure is scheduled on:  08/22/2012  Report to [facility] at 1045 AM.  Call this number if you have problems the morning of surgery: [phone]   Remember:   Do not eat food or drink liquids after midnight.   Take these medicines the morning of surgery with A SIP OF WATER: Norco,Azor, Prilosec,Hydralazine,Xanax,Chlorthalidone,Viibyrd   Do not wear jewelry, make-up or nail polish.  Do not wear lotions, powders, or perfumes. You may wear deodorant.  Do not shave 48 hours prior to surgery. Men may shave face and neck.  Do not bring valuables to the hospital.  Contacts, dentures or bridgework may not be worn into surgery.  Leave suitcase in the car. After surgery it may be brought to your room.  For patients admitted to the hospital, checkout time is 11:00 AM the day of  discharge.   Patients discharged the day of surgery will not be allowed to drive  home.  Name and phone number of your driver: Son  Special Instructions: Shower using CHG 2 nights before surgery and the night before surgery.  If you shower the day of surgery use CHG.  Use special wash - you have one bottle of CHG for all showers.  You should use approximately 1/3 of the bottle for each shower.   Please read over the following fact sheets that you were given: Pain Booklet, Coughing and Deep Breathing, MRSA Information, Surgical Site Infection Prevention, Anesthesia Post-op Instructions and Care and Recovery After Surgery  Arthroscopic Procedure, Knee An arthroscopic procedure can find what is wrong with your knee. PROCEDURE Arthroscopy is a surgical technique that allows your orthopedic surgeon to diagnose and treat your knee injury with accuracy. They will look into your knee through a small instrument. This is almost like a small (pencil sized) telescope. Because arthroscopy affects your knee less than open knee surgery, you can anticipate a more rapid recovery. Taking an active role by  following your caregiver's instructions will help with rapid and complete recovery. Use crutches, rest, elevation, ice, and knee exercises as instructed. The length of recovery depends on various factors including type of injury, age, physical condition, medical conditions, and your rehabilitation. Your knee is the joint between the large bones (femur and tibia) in your leg. Cartilage covers these bone ends which are smooth and slippery and allow your knee to bend and move smoothly. Two menisci, thick, semi-lunar shaped pads of cartilage which form a rim inside the joint, help absorb shock and stabilize your knee. Ligaments bind the bones together and support your knee joint. Muscles move the joint, help support your knee, and take stress off the joint itself. Because of this all programs and physical therapy to rehabilitate an injured or repaired knee require rebuilding and strengthening your muscles. AFTER THE PROCEDURE  After the procedure, you will be moved to a recovery area until most of the effects of the medication have worn off. Your caregiver will discuss the test results with you.  Only take over-the-counter or prescription medicines for pain, discomfort, or fever as directed by your caregiver. SEEK MEDICAL CARE IF:   You have increased bleeding from your wounds.  You see redness, swelling, or have increasing pain in your wounds.  You have pus coming from your wound.  You have an oral temperature above 102 F (38.9 C).  You notice a bad smell coming from the wound or dressing.  You have severe pain with any motion of  your knee. SEEK IMMEDIATE MEDICAL CARE IF:   You develop a rash.  You have difficulty breathing.  You have any allergic problems. Document Released: 03/16/2000 Document Revised: 06/11/2011 Document Reviewed: 10/08/2007 [facility] Patient Information 2013 [place], [place]. Meniscus Injury of the Knee, Arthroscopy You may have an internal derangement of the knee.  This means something is wrong inside the knee. Your caregiver can make a more accurate diagnosis (learning what is wrong) by performing an arthroscopic procedure. Your knee has two layers of cartilage. Articular cartilage covers the bone ends. It lets your knee bend and move smoothly. Two menisci (thick pads of cartilage that form a rim inside the joint) help absorb shock. They stabilize your knee. Ligaments bind the bones together. They support your knee joint. Muscles move the joint, help support your knee, and take stress off the joint itself.  ABOUT THE PROCEDURE Arthroscopy is a surgical technique. It allows your orthopedic surgeon to diagnose and treat your knee injury with accuracy. The surgeon looks into your knee through a small scope. The scope is like a small (pencil-sized) telescope. Arthroscopy is less invasive than open knee surgery. You can expect a more rapid recovery. Following your caregiver's instructions will help you recover rapidly and completely. Use crutches, rest, elevate, ice, and do knee exercises as instructed. The length of recovery depends on various factors. These factors include type of injury, age, physical condition, medical conditions, and your determination. How long you will be away from your normal activities will depend on what kind of knee problem you have. It will also depend on how much tissue is damaged. Rebuilding your muscles after arthroscopy helps ensure a full recovery. RECOVERY Recovery after a meniscus injury depends on how much meniscus is damaged. It also depends on whether or not you have damaged other knee tissue. With small tears, your recovery may take a couple weeks. Larger tears will take longer. Meniscus injuries can usually be treated during arthroscopy. If your injury is on the inner edge of the meniscus, your surgeon may trim the meniscus back to a smooth rim. In other cases, your surgeon will try to repair a damaged meniscus with sutures (stitches).  This may lengthen your rehabilitation. It may provide better long-term health by helping your knee retain its shock absorption abilities. Use crutches, limit weight bearing, rest, elevate, apply ice, and exercise your knee as instructed. If a brace is applied, use as directed. The length of recovery depends on various factors including type of injury, age, physical condition, other medical conditions, and your determination. Your caregiver will help with instructions for rehabilitation of your knee. HOME CARE INSTRUCTIONS  Use crutches and knee exercises as instructed.  Applying an ice pack to your operative site may help with discomfort. It may also keep the swelling down.  Only take over-the-counter or prescription medicines for pain, discomfort, inflammation (soreness)or fever as directed by your caregiver. You may use these only if your caregiver has not given medications that would interfere.  You may resume normal diet and activities as directed. SEEK MEDICAL ATTENTION IF:  There is increased bleeding (more than a small spot) from the wound.  You notice redness, swelling, or increasing pain in the wound.  Pus is coming from wound.  An unexplained oral temperature above 102 F (38.9 C) develops, or as your caregiver suggests.  You notice a foul smell coming from the wound or dressing. SEEK IMMEDIATE MEDICAL CARE IF:  You develop a rash.  You have difficulty breathing.  You have any allergic problems. Document Released: 03/16/2000 Document Revised: 06/11/2011 Document Reviewed: 06/02/2007 [facility] Patient Information 2013 [place], [place]. PATIENT INSTRUCTIONS POST-ANESTHESIA  IMMEDIATELY FOLLOWING SURGERY:  Do not drive or operate machinery for the first twenty four hours after surgery.  Do not make any important decisions for twenty four hours after surgery or while taking narcotic pain medications or sedatives.  If you develop intractable nausea and vomiting or a severe  headache please notify your doctor immediately.  FOLLOW-UP:  Please make an appointment with your surgeon as instructed. You do not need to follow up with anesthesia unless specifically instructed to do so.  WOUND CARE INSTRUCTIONS (if applicable):  Keep a dry clean dressing on the anesthesia/puncture wound site if there is drainage.  Once the wound has quit draining you may leave it open to air.  Generally you should leave the bandage intact for twenty four hours unless there is drainage.  If the epidural site drains for more than 36-48 hours please call the anesthesia department.  QUESTIONS?:  Please feel free to call your physician or the hospital operator if you have any questions, and they will be happy to assist you.

## 2012-08-19 NOTE — Telephone Encounter (Signed)
Surgery scheduled for 08/22/12

## 2012-08-20 ENCOUNTER — Other Ambulatory Visit (HOSPITAL_COMMUNITY): Payer: Self-pay | Admitting: Family Medicine

## 2012-08-20 DIAGNOSIS — Z139 Encounter for screening, unspecified: Secondary | ICD-10-CM

## 2012-08-21 NOTE — H&P (Signed)
  Patient ID: April Ward, female DOB: 1960-11-14, 52 y.o. MRN: [id number]  Chief Complaint   Patient presents with   .  Knee Pain     Severe right knee pain d/ t injury   BP 206/102  Ht 5' 11.5" (1.816 m)  Wt 286 lb (129.729 kg)  BMI 39.34 kg/m2  Patient history referral by Dr. Mirna Mires  52 year old female started having pain in her right knee in February 2014. She participates in some classes but doesn't recall a specific injury. She had some aches and pains in the knee prior to some classes and sometimes her knee would feel bad afterwards  She did take some pain medication from the emergency room after a visit related to her knee. Her x-ray show degenerative changes he eventually went to have MRI. She complains of sharp throbbing 10 out of 10 constant pain worse with twisting turning driving walking slightly improved with ice she notes swelling and giving way with crepitance  Review of systems negative except for the following morning of the eyes, heart murmur, snoring, unsteady gait, anxiety, seasonal allergy. 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  Ambulation altered by the pain in the right knee  Right knee  Inspection reveals that there is tenderness over the medial compartment crepitus in the patellofemoral joint with pain with patellar compression. Joint effusion mild. Range of motion remains normal. Ligaments are stable motor exam is normal skin is intact  Right knee McMurray sign positive  Left knee  Inspection reveals that there is no evidence of tenderness or swelling no mass no  effusion, range of motion remains full without contracture. No crepitation is felt. The collateral ligaments and cruciate ligaments are stable. Muscle strength and muscle tone are normal  Left knee McMurray sign negative  Skin is normal in both knees without caf au lait spots scars or rashes  Deep tendon reflexes are 2+ without pathologic reflexes  Sensation remains normal bilaterally  Upper extremity exam  The right and left upper extremity:  Inspection and palpation revealed no abnormalities in the upper extremities.  Range of motion is full without contracture.  Motor exam is normal with grade 5 strength.  The joints are fully reduced without subluxation.  There is no atrophy or tremor and muscle tone is normal. All joints are stable. We took her blood pressure twice was elevated both time she says she did take her medication at night as normal  We called Dr. Adaline Sill office to get this evaluated prior to surgery  We have discussed and she is agreed to arthroscopic surgery of the right knee for partial medial meniscectomy  We discussed her being out of work for 4 weeks she says she can do work from home and can go to work and use a wheelchair for needed but she will agree to one week out of work  OA (osteoarthritis) of knee  Medial meniscus, posterior horn derangement, right  Surgical arthroscopy right knee partial medial meniscectomy

## 2012-08-22 ENCOUNTER — Ambulatory Visit (HOSPITAL_COMMUNITY)
Admission: RE | Admit: 2012-08-22 | Discharge: 2012-08-22 | Disposition: A | Discharge status: Home / Self Care | Payer: BC Managed Care – PPO | Source: Ambulatory Visit | Attending: Orthopedic Surgery | Admitting: Orthopedic Surgery

## 2012-08-22 ENCOUNTER — Encounter (HOSPITAL_COMMUNITY): Payer: Self-pay | Admitting: Anesthesiology

## 2012-08-22 ENCOUNTER — Encounter (HOSPITAL_COMMUNITY)
Admission: RE | Disposition: A | Discharge status: Home / Self Care | Payer: Self-pay | Source: Ambulatory Visit | Attending: Orthopedic Surgery

## 2012-08-22 ENCOUNTER — Other Ambulatory Visit (HOSPITAL_COMMUNITY): Payer: BC Managed Care – PPO

## 2012-08-22 ENCOUNTER — Encounter (HOSPITAL_COMMUNITY): Payer: Self-pay | Admitting: *Deleted

## 2012-08-22 ENCOUNTER — Ambulatory Visit (HOSPITAL_COMMUNITY): Payer: BC Managed Care – PPO | Admitting: Anesthesiology

## 2012-08-22 DIAGNOSIS — M179 Osteoarthritis of knee, unspecified: Secondary | ICD-10-CM

## 2012-08-22 DIAGNOSIS — IMO0002 Reserved for concepts with insufficient information to code with codable children: Secondary | ICD-10-CM | POA: Insufficient documentation

## 2012-08-22 DIAGNOSIS — R0989 Other specified symptoms and signs involving the circulatory and respiratory systems: Secondary | ICD-10-CM | POA: Insufficient documentation

## 2012-08-22 DIAGNOSIS — M1710 Unilateral primary osteoarthritis, unspecified knee: Secondary | ICD-10-CM | POA: Insufficient documentation

## 2012-08-22 DIAGNOSIS — I10 Essential (primary) hypertension: Secondary | ICD-10-CM | POA: Insufficient documentation

## 2012-08-22 DIAGNOSIS — F411 Generalized anxiety disorder: Secondary | ICD-10-CM | POA: Insufficient documentation

## 2012-08-22 DIAGNOSIS — R0609 Other forms of dyspnea: Secondary | ICD-10-CM | POA: Insufficient documentation

## 2012-08-22 DIAGNOSIS — X58XXXA Exposure to other specified factors, initial encounter: Secondary | ICD-10-CM | POA: Insufficient documentation

## 2012-08-22 DIAGNOSIS — M23321 Other meniscus derangements, posterior horn of medial meniscus, right knee: Secondary | ICD-10-CM

## 2012-08-22 DIAGNOSIS — E119 Type 2 diabetes mellitus without complications: Secondary | ICD-10-CM | POA: Insufficient documentation

## 2012-08-22 DIAGNOSIS — M23329 Other meniscus derangements, posterior horn of medial meniscus, unspecified knee: Secondary | ICD-10-CM

## 2012-08-22 DIAGNOSIS — R011 Cardiac murmur, unspecified: Secondary | ICD-10-CM | POA: Insufficient documentation

## 2012-08-22 HISTORY — PX: KNEE ARTHROSCOPY WITH MEDIAL MENISECTOMY: SHX5651

## 2012-08-22 HISTORY — PX: CHONDROPLASTY: SHX5177

## 2012-08-22 LAB — GLUCOSE, CAPILLARY
Glucose-Capillary: 139 mg/dL — ABNORMAL HIGH (ref 70–99)
Glucose-Capillary: 120 mg/dL — ABNORMAL HIGH (ref 70–99)

## 2012-08-22 SURGERY — ARTHROSCOPY, KNEE, WITH MEDIAL MENISCECTOMY
Anesthesia: General | Site: Knee | Laterality: Right | Wound class: Clean

## 2012-08-22 MED ORDER — DEXTROSE 5 % IV SOLN
3.0000 g | INTRAVENOUS | Status: DC | PRN
  Administered 2012-08-22: 3 g via INTRAVENOUS

## 2012-08-22 MED ORDER — BUPIVACAINE-EPINEPHRINE PF 0.5-1:200000 % IJ SOLN
INTRAMUSCULAR | Status: AC
  Filled 2012-08-22: qty 20

## 2012-08-22 MED ORDER — ONDANSETRON HCL 4 MG/2ML IJ SOLN
INTRAMUSCULAR | Status: AC
  Filled 2012-08-22: qty 2

## 2012-08-22 MED ORDER — ONDANSETRON HCL 4 MG/2ML IJ SOLN: 4.0000 mg | Freq: Once | INTRAMUSCULAR | Status: DC

## 2012-08-22 MED ORDER — BUPIVACAINE-EPINEPHRINE PF 0.5-1:200000 % IJ SOLN
INTRAMUSCULAR | Status: DC | PRN
  Administered 2012-08-22: 12 mL
  Administered 2012-08-22: 48 mL

## 2012-08-22 MED ORDER — SUCCINYLCHOLINE CHLORIDE 20 MG/ML IJ SOLN
INTRAMUSCULAR | Status: DC | PRN
  Administered 2012-08-22: 140 mg via INTRAVENOUS

## 2012-08-22 MED ORDER — HYDROCODONE-ACETAMINOPHEN 7.5-325 MG PO TABS
ORAL_TABLET | ORAL | Status: AC
  Filled 2012-08-22: qty 1

## 2012-08-22 MED ORDER — DEXTROSE 5 % IV SOLN
3.0000 g | INTRAVENOUS | Status: DC
  Filled 2012-08-22: qty 3000

## 2012-08-22 MED ORDER — ONDANSETRON HCL 4 MG/2ML IJ SOLN
4.0000 mg | Freq: Once | INTRAMUSCULAR | Status: AC
  Administered 2012-08-22: 4 mg via INTRAVENOUS

## 2012-08-22 MED ORDER — MIDAZOLAM HCL 2 MG/2ML IJ SOLN
1.0000 mg | INTRAMUSCULAR | Status: DC | PRN
  Administered 2012-08-22: 2 mg via INTRAVENOUS

## 2012-08-22 MED ORDER — EPINEPHRINE HCL 1 MG/ML IJ SOLN
INTRAMUSCULAR | Status: AC
  Filled 2012-08-22: qty 3

## 2012-08-22 MED ORDER — FENTANYL CITRATE 0.05 MG/ML IJ SOLN
INTRAMUSCULAR | Status: AC
  Filled 2012-08-22: qty 5

## 2012-08-22 MED ORDER — SUCCINYLCHOLINE CHLORIDE 20 MG/ML IJ SOLN
INTRAMUSCULAR | Status: AC
  Filled 2012-08-22: qty 1

## 2012-08-22 MED ORDER — PROMETHAZINE HCL 12.5 MG PO TABS: 12.5000 mg | ORAL_TABLET | Freq: Four times a day (QID) | ORAL | Status: DC | PRN

## 2012-08-22 MED ORDER — FENTANYL CITRATE 0.05 MG/ML IJ SOLN: 25.0000 ug | INTRAMUSCULAR | Status: DC | PRN

## 2012-08-22 MED ORDER — GLYCOPYRROLATE 0.2 MG/ML IJ SOLN
INTRAMUSCULAR | Status: DC | PRN
  Administered 2012-08-22: 0.4 mg via INTRAVENOUS

## 2012-08-22 MED ORDER — ROCURONIUM BROMIDE 100 MG/10ML IV SOLN
INTRAVENOUS | Status: DC | PRN
Start: 2012-08-22 — End: 2012-08-22
  Administered 2012-08-22: 8 mg via INTRAVENOUS
  Administered 2012-08-22: 20 mg via INTRAVENOUS

## 2012-08-22 MED ORDER — LIDOCAINE HCL (CARDIAC) 20 MG/ML IV SOLN
INTRAVENOUS | Status: DC | PRN
  Administered 2012-08-22: 30 mg via INTRAVENOUS

## 2012-08-22 MED ORDER — HYDROCODONE-ACETAMINOPHEN 7.5-325 MG PO TABS
1.0000 | ORAL_TABLET | Freq: Once | ORAL | Status: AC
  Administered 2012-08-22: 1 via ORAL

## 2012-08-22 MED ORDER — MIDAZOLAM HCL 2 MG/2ML IJ SOLN
INTRAMUSCULAR | Status: AC
  Filled 2012-08-22: qty 2

## 2012-08-22 MED ORDER — CEFAZOLIN SODIUM 1-5 GM-% IV SOLN
INTRAVENOUS | Status: AC
  Filled 2012-08-22: qty 50

## 2012-08-22 MED ORDER — ONDANSETRON HCL 4 MG/2ML IJ SOLN
4.0000 mg | Freq: Once | INTRAMUSCULAR | Status: AC | PRN
  Administered 2012-08-22: 4 mg via INTRAVENOUS

## 2012-08-22 MED ORDER — CEFAZOLIN SODIUM-DEXTROSE 2-3 GM-% IV SOLR
INTRAVENOUS | Status: AC
  Filled 2012-08-22: qty 50

## 2012-08-22 MED ORDER — KETOROLAC TROMETHAMINE 30 MG/ML IJ SOLN
INTRAMUSCULAR | Status: AC
  Filled 2012-08-22: qty 1

## 2012-08-22 MED ORDER — CEFAZOLIN SODIUM 1-5 GM-% IV SOLN: 1.0000 g | Freq: Once | INTRAVENOUS | Status: DC

## 2012-08-22 MED ORDER — PROPOFOL 10 MG/ML IV BOLUS
INTRAVENOUS | Status: DC | PRN
  Administered 2012-08-22: 200 mg via INTRAVENOUS

## 2012-08-22 MED ORDER — HYDROCODONE-ACETAMINOPHEN 10-325 MG PO TABS: 1.0000 | ORAL_TABLET | ORAL | Status: DC | PRN

## 2012-08-22 MED ORDER — NEOSTIGMINE METHYLSULFATE 1 MG/ML IJ SOLN
INTRAMUSCULAR | Status: DC | PRN
  Administered 2012-08-22: 2 mg via INTRAVENOUS

## 2012-08-22 MED ORDER — LACTATED RINGERS IV SOLN
INTRAVENOUS | Status: DC
  Administered 2012-08-22: 1000 mL via INTRAVENOUS

## 2012-08-22 MED ORDER — CEFAZOLIN SODIUM-DEXTROSE 2-3 GM-% IV SOLR: 2.0000 g | Freq: Once | INTRAVENOUS | Status: DC

## 2012-08-22 MED ORDER — CHLORHEXIDINE GLUCONATE 4 % EX LIQD: 60.0000 mL | Freq: Once | CUTANEOUS | Status: DC

## 2012-08-22 MED ORDER — KETOROLAC TROMETHAMINE 30 MG/ML IJ SOLN
30.0000 mg | Freq: Once | INTRAMUSCULAR | Status: AC
  Administered 2012-08-22: 30 mg via INTRAVENOUS

## 2012-08-22 MED ORDER — FENTANYL CITRATE 0.05 MG/ML IJ SOLN
INTRAMUSCULAR | Status: DC | PRN
  Administered 2012-08-22 (×2): 50 ug via INTRAVENOUS

## 2012-08-22 MED ORDER — ROCURONIUM BROMIDE 50 MG/5ML IV SOLN
INTRAVENOUS | Status: AC
  Filled 2012-08-22: qty 1

## 2012-08-22 MED ORDER — 0.9 % SODIUM CHLORIDE (POUR BTL) OPTIME
TOPICAL | Status: DC | PRN
  Administered 2012-08-22: 1000 mL

## 2012-08-22 MED ORDER — SODIUM CHLORIDE 0.9 % IR SOLN
Status: DC | PRN
  Administered 2012-08-22 (×3)

## 2012-08-22 MED ORDER — PROPOFOL 10 MG/ML IV EMUL
INTRAVENOUS | Status: AC
  Filled 2012-08-22: qty 20

## 2012-08-22 SURGICAL SUPPLY — 52 items
ARTHROWAND PARAGON T2 (SURGICAL WAND)
BAG HAMPER (MISCELLANEOUS) ×2 IMPLANT
BANDAGE ELASTIC 6 VELCRO NS (GAUZE/BANDAGES/DRESSINGS) ×2 IMPLANT
BLADE AGGRESSIVE PLUS 4.0 (BLADE) ×2 IMPLANT
BLADE SURG SZ11 CARB STEEL (BLADE) ×2 IMPLANT
CHLORAPREP W/TINT 26ML (MISCELLANEOUS) ×2 IMPLANT
CLOTH BEACON ORANGE TIMEOUT ST (SAFETY) ×2 IMPLANT
COOLER CRYO IC GRAV AND TUBE (ORTHOPEDIC SUPPLIES) ×2 IMPLANT
CUFF CRYO KNEE18X23 MED (MISCELLANEOUS) ×2 IMPLANT
CUFF TOURNIQUET SINGLE 34IN LL (TOURNIQUET CUFF) IMPLANT
CUFF TOURNIQUET SINGLE 44IN (TOURNIQUET CUFF) ×2 IMPLANT
CUTTER ANGLED DBL BITE 4.5 (BURR) IMPLANT
DECANTER SPIKE VIAL GLASS SM (MISCELLANEOUS) ×4 IMPLANT
GAUZE SPONGE 4X4 16PLY XRAY LF (GAUZE/BANDAGES/DRESSINGS) ×2 IMPLANT
GAUZE XEROFORM 5X9 LF (GAUZE/BANDAGES/DRESSINGS) ×2 IMPLANT
GLOVE BIOGEL PI IND STRL 7.0 (GLOVE) ×4 IMPLANT
GLOVE BIOGEL PI INDICATOR 7.0 (GLOVE) ×4
GLOVE ECLIPSE 7.0 STRL STRAW (GLOVE) ×4 IMPLANT
GLOVE EXAM NITRILE MD LF STRL (GLOVE) ×2 IMPLANT
GLOVE OPTIFIT SS 6.5 STRL BRWN (GLOVE) ×2 IMPLANT
GLOVE SKINSENSE NS SZ8.0 LF (GLOVE) ×1
GLOVE SKINSENSE STRL SZ8.0 LF (GLOVE) ×1 IMPLANT
GLOVE SS BIOGEL STRL SZ 6.5 (GLOVE) ×1 IMPLANT
GLOVE SS N UNI LF 8.5 STRL (GLOVE) ×2 IMPLANT
GLOVE SUPERSENSE BIOGEL SZ 6.5 (GLOVE) ×1
GOWN STRL REIN XL XLG (GOWN DISPOSABLE) IMPLANT
HLDR LEG FOAM (MISCELLANEOUS) ×1 IMPLANT
IV NS IRRIG 3000ML ARTHROMATIC (IV SOLUTION) ×6 IMPLANT
KIT BLADEGUARD II DBL (SET/KITS/TRAYS/PACK) ×2 IMPLANT
KIT ROOM TURNOVER AP CYSTO (KITS) ×2 IMPLANT
LEG HOLDER FOAM (MISCELLANEOUS) ×1
MANIFOLD NEPTUNE II (INSTRUMENTS) ×2 IMPLANT
MARKER SKIN DUAL TIP RULER LAB (MISCELLANEOUS) ×2 IMPLANT
NEEDLE HYPO 18GX1.5 BLUNT FILL (NEEDLE) ×2 IMPLANT
NEEDLE HYPO 21X1.5 SAFETY (NEEDLE) ×2 IMPLANT
NEEDLE SPNL 18GX3.5 QUINCKE PK (NEEDLE) ×2 IMPLANT
NS IRRIG 1000ML POUR BTL (IV SOLUTION) ×2 IMPLANT
PACK ARTHRO LIMB DRAPE STRL (MISCELLANEOUS) ×2 IMPLANT
PAD ABD 5X9 TENDERSORB (GAUZE/BANDAGES/DRESSINGS) ×2 IMPLANT
PAD ARMBOARD 7.5X6 YLW CONV (MISCELLANEOUS) ×2 IMPLANT
PADDING CAST COTTON 6X4 STRL (CAST SUPPLIES) ×2 IMPLANT
SET ARTHROSCOPY INST (INSTRUMENTS) ×2 IMPLANT
SET ARTHROSCOPY PUMP TUBE (IRRIGATION / IRRIGATOR) ×2 IMPLANT
SET BASIN LINEN APH (SET/KITS/TRAYS/PACK) ×2 IMPLANT
SPONGE GAUZE 4X4 12PLY (GAUZE/BANDAGES/DRESSINGS) ×2 IMPLANT
SUT ETHILON 3 0 FSL (SUTURE) ×2 IMPLANT
SYR 30ML LL (SYRINGE) ×2 IMPLANT
SYRINGE 10CC LL (SYRINGE) ×2 IMPLANT
WAND 50 DEG COVAC W/CORD (SURGICAL WAND) ×2 IMPLANT
WAND 90 DEG TURBOVAC W/CORD (SURGICAL WAND) IMPLANT
WAND ARTHRO PARAGON T2 (SURGICAL WAND) IMPLANT
YANKAUER SUCT BULB TIP 10FT TU (MISCELLANEOUS) ×6 IMPLANT

## 2012-08-22 NOTE — Anesthesia Postprocedure Evaluation (Signed)
  Anesthesia Post-op Note  Patient: April Ward  Procedure(s) Performed: Procedure(s): KNEE ARTHROSCOPY WITH PARTIAL MEDIAL MENISECTOMY (Right) CHONDROPLASTY (Right)  Patient Location: PACU  Anesthesia Type:General  Level of Consciousness: awake, alert  and oriented  Airway and Oxygen Therapy: Patient Spontanous Breathing and Patient connected to face mask oxygen  Post-op Pain: none  Post-op Assessment: Post-op Vital signs reviewed, Patient's Cardiovascular Status Stable, Respiratory Function Stable, Patent Airway and No signs of Nausea or vomiting  Post-op Vital Signs: Reviewed and stable  Complications: No apparent anesthesia complications

## 2012-08-22 NOTE — Anesthesia Procedure Notes (Signed)
Procedure Name: Intubation Performed by: Moshe Salisbury Pre-anesthesia Checklist: Patient identified, Patient being monitored, Timeout performed, Emergency Drugs available and Suction available Patient Re-evaluated:Patient Re-evaluated prior to inductionOxygen Delivery Method: Circle System Utilized Preoxygenation: Pre-oxygenation with 100% oxygen Intubation Type: IV induction Ventilation: Mask ventilation without difficulty Laryngoscope Size: Mac and 3 Grade View: Grade I Tube type: Oral Tube size: 7.0 mm Number of attempts: 1 Airway Equipment and Method: stylet Placement Confirmation: ETT inserted through vocal cords under direct vision,  positive ETCO2 and breath sounds checked- equal and bilateral Secured at: 21 cm Tube secured with: Tape Dental Injury: Teeth and Oropharynx as per pre-operative assessment

## 2012-08-22 NOTE — Op Note (Signed)
08/22/2012  12:22 PM  PATIENT:  April Ward  52 y.o. female  PRE-OPERATIVE DIAGNOSIS:  Medial meniscal tear right knee  POST-OPERATIVE DIAGNOSIS:  Medial meniscal tear right knee, arthritis right knee  PROCEDURE:  Procedure(s): KNEE ARTHROSCOPY WITH PARTIAL MEDIAL MENISECTOMY (Right) CHONDROPLASTY (Right)  Operative findings torn posterior horn medial meniscus grade 2 chondral changes medial femoral condyle and trochlea anterior cruciate ligament PCL normal lateral meniscus normal lateral compartment normal medial lateral facet patella normal  Dictation: The patient was identified in the preop holding area the right knee was confirmed as a surgical site marked with surgeon's initials chart update was completed  The patient was taken to the operating room and given a weight-based dose of antibiotics. She had general anesthesia without complication. In the supine position the right leg was placed in an arthroscopic leg holder and the left leg was on a well padded holding device.  The right leg was then prepped and draped sterilely. No tourniquet was used.  The patients anatomy was somewhat aberrant and therefore a medial portal was established and the scope was placed in the lateral compartment to start the arthroscopy. A circumferential diagnostic arthroscopy was completed and with the assistance of a spinal needle a lateral portal was established. After defining the meniscal tear an arthroscopic shaver was used to remove synovial tissue which was in our site of view. A duckbill forceps was used to trim and remove the meniscal tear and the meniscal fragments were removed with a motorized shaver. The remaining joint surfaces were palpated with the probe and found to be intact except for the degenerative changes described.  The knee was washed with the arthroscopic washing mode of the pump. 50 cc of Marcaine with epinephrine was injected in the knee and the portals were closed with 3-0 nylon  suture. A sterile bandage was applied and a Cryo/Cuff was placed and activated.  SURGEON:  Surgeon(s) and Role:    * Stanley E Harrison, MD - Primary  PHYSICIAN ASSISTANT:   ASSISTANTS: catherine page    ANESTHESIA:   general  EBL:  Total I/O In: 300 [I.V.:300] Out: 0   BLOOD ADMINISTERED:none  DRAINS: none   LOCAL MEDICATIONS USED:  MARCAINE with epi , Amount: 60cc  SPECIMEN:  No Specimen  DISPOSITION OF SPECIMEN:  N/A  COUNTS:  YES  TOURNIQUET:    DICTATION: .Dragon Dictation  PLAN OF CARE: Discharge to home after PACU  PATIENT DISPOSITION:  PACU - hemodynamically stable.   Delay start of Pharmacological VTE agent (>24hrs) due to surgical blood loss or risk of bleeding: not applicable  

## 2012-08-22 NOTE — Transfer of Care (Signed)
Immediate Anesthesia Transfer of Care Note  Patient: April Ward  Procedure(s) Performed: Procedure(s): KNEE ARTHROSCOPY WITH PARTIAL MEDIAL MENISECTOMY (Right) CHONDROPLASTY (Right)  Patient Location: PACU  Anesthesia Type:General  Level of Consciousness: awake, alert  and oriented  Airway & Oxygen Therapy: Patient Spontanous Breathing and Patient connected to face mask oxygen  Post-op Assessment: Report given to PACU RN  Post vital signs: Reviewed and stable  Complications: No apparent anesthesia complications

## 2012-08-22 NOTE — Anesthesia Preprocedure Evaluation (Addendum)
Anesthesia Evaluation  Patient identified by MRN, date of birth, ID band Patient awake    Reviewed: Allergy & Precautions, H&P , NPO status , Patient's Chart, lab work & pertinent test results  Airway Mallampati: II TM Distance: >3 FB Neck ROM: Full    Dental  (+) Teeth Intact   Pulmonary  breath sounds clear to auscultation        Cardiovascular Rhythm:Regular Rate:Normal     Neuro/Psych PSYCHIATRIC DISORDERS Anxiety Depression    GI/Hepatic GERD-  Medicated and Controlled,  Endo/Other  diabetes, Well Controlled, Type 2, Oral Hypoglycemic Agents  Renal/GU      Musculoskeletal   Abdominal   Peds  Hematology   Anesthesia Other Findings   Reproductive/Obstetrics                          Anesthesia Physical Anesthesia Plan  ASA: III  Anesthesia Plan: General   Post-op Pain Management:    Induction: Intravenous, Rapid sequence and Cricoid pressure planned  Airway Management Planned: Oral ETT  Additional Equipment:   Intra-op Plan:   Post-operative Plan: Extubation in OR  Informed Consent: I have reviewed the patients History and Physical, chart, labs and discussed the procedure including the risks, benefits and alternatives for the proposed anesthesia with the patient or authorized representative who has indicated his/her understanding and acceptance.     Plan Discussed with:   Anesthesia Plan Comments:         Anesthesia Quick Evaluation

## 2012-08-22 NOTE — Brief Op Note (Addendum)
08/22/2012  12:22 PM  PATIENT:  April Ward  52 y.o. female  PRE-OPERATIVE DIAGNOSIS:  Medial meniscal tear right knee  POST-OPERATIVE DIAGNOSIS:  Medial meniscal tear right knee, arthritis right knee  PROCEDURE:  Procedure(s): KNEE ARTHROSCOPY WITH PARTIAL MEDIAL MENISECTOMY (Right) CHONDROPLASTY (Right)  Operative findings torn posterior horn medial meniscus grade 2 chondral changes medial femoral condyle and trochlea anterior cruciate ligament PCL normal lateral meniscus normal lateral compartment normal medial lateral facet patella normal  Dictation: The patient was identified in the preop holding area the right knee was confirmed as a surgical site marked with surgeon's initials chart update was completed  The patient was taken to the operating room and given a weight-based dose of antibiotics. She had general anesthesia without complication. In the supine position the right leg was placed in an arthroscopic leg holder and the left leg was on a well padded holding device.  The right leg was then prepped and draped sterilely. No tourniquet was used.  The patients anatomy was somewhat aberrant and therefore a medial portal was established and the scope was placed in the lateral compartment to start the arthroscopy. A circumferential diagnostic arthroscopy was completed and with the assistance of a spinal needle a lateral portal was established. After defining the meniscal tear an arthroscopic shaver was used to remove synovial tissue which was in our site of view. A duckbill forceps was used to trim and remove the meniscal tear and the meniscal fragments were removed with a motorized shaver. The remaining joint surfaces were palpated with the probe and found to be intact except for the degenerative changes described.  The knee was washed with the arthroscopic washing mode of the pump. 50 cc of Marcaine with epinephrine was injected in the knee and the portals were closed with 3-0 nylon  suture. A sterile bandage was applied and a Cryo/Cuff was placed and activated.  SURGEON:  Surgeon(s) and Role:    * Vickki Hearing, MD - Primary  PHYSICIAN ASSISTANT:   ASSISTANTS: catherine page    ANESTHESIA:   general  EBL:  Total I/O In: 300 [I.V.:300] Out: 0   BLOOD ADMINISTERED:none  DRAINS: none   LOCAL MEDICATIONS USED:  MARCAINE with epi , Amount: 60cc  SPECIMEN:  No Specimen  DISPOSITION OF SPECIMEN:  N/A  COUNTS:  YES  TOURNIQUET:    DICTATION: .Dragon Dictation  PLAN OF CARE: Discharge to home after PACU  PATIENT DISPOSITION:  PACU - hemodynamically stable.   Delay start of Pharmacological VTE agent (>24hrs) due to surgical blood loss or risk of bleeding: not applicable

## 2012-08-22 NOTE — Interval H&P Note (Signed)
History and Physical Interval Note:  08/22/2012 11:06 AM  April Ward  has presented today for surgery, with the diagnosis of Medial meniscal tear right knee  The various methods of treatment have been discussed with the patient and family. After consideration of risks, benefits and other options for treatment, the patient has consented to  Procedure(s) with comments: KNEE ARTHROSCOPY WITH PARTIAL MEDIAL MENISECTOMY (Right) - Partial Menisectomy as a surgical intervention .  The patient's history has been reviewed, patient examined, no change in status, stable for surgery.  I have reviewed the patient's chart and labs.  Questions were answered to the patient's satisfaction.     Fuller Canada

## 2012-08-26 ENCOUNTER — Encounter (HOSPITAL_COMMUNITY): Payer: Self-pay | Admitting: Orthopedic Surgery

## 2012-08-26 ENCOUNTER — Ambulatory Visit (INDEPENDENT_AMBULATORY_CARE_PROVIDER_SITE_OTHER): Payer: BC Managed Care – PPO | Admitting: Orthopedic Surgery

## 2012-08-26 DIAGNOSIS — M1710 Unilateral primary osteoarthritis, unspecified knee: Secondary | ICD-10-CM

## 2012-08-26 DIAGNOSIS — M23329 Other meniscus derangements, posterior horn of medial meniscus, unspecified knee: Secondary | ICD-10-CM

## 2012-08-26 DIAGNOSIS — M23321 Other meniscus derangements, posterior horn of medial meniscus, right knee: Secondary | ICD-10-CM

## 2012-08-26 DIAGNOSIS — IMO0002 Reserved for concepts with insufficient information to code with codable children: Secondary | ICD-10-CM

## 2012-08-26 DIAGNOSIS — M179 Osteoarthritis of knee, unspecified: Secondary | ICD-10-CM

## 2012-08-26 NOTE — Progress Notes (Signed)
Patient ID: NIYAH MAMARIL, female   DOB: Apr 13, 1960, 52 y.o.   MRN: [id number] No chief complaint on file.  postop visit #1 surgery date 08/22/2012  No complaints  Operative note  PRE-OPERATIVE DIAGNOSIS: Medial meniscal tear right knee  POST-OPERATIVE DIAGNOSIS: Medial meniscal tear right knee, arthritis right knee  PROCEDURE: Procedure(s):  KNEE ARTHROSCOPY WITH PARTIAL MEDIAL MENISECTOMY (Right)  CHONDROPLASTY (Right)  Operative findings torn posterior horn medial meniscus grade 2 chondral changes medial femoral condyle and trochlea anterior cruciate ligament PCL normal lateral meniscus normal lateral compartment normal medial lateral facet patella normal   Portals looked clean sutures are removed  Start physical therapy return in 3 weeks

## 2012-08-27 ENCOUNTER — Encounter: Payer: Self-pay | Admitting: Family Medicine

## 2012-09-03 ENCOUNTER — Ambulatory Visit (HOSPITAL_COMMUNITY)
Admission: RE | Admit: 2012-09-03 | Discharge: 2012-09-03 | Disposition: A | Discharge status: Home / Self Care | Payer: Self-pay | Source: Ambulatory Visit | Attending: Orthopedic Surgery | Admitting: Orthopedic Surgery

## 2012-09-03 DIAGNOSIS — I10 Essential (primary) hypertension: Secondary | ICD-10-CM | POA: Insufficient documentation

## 2012-09-03 DIAGNOSIS — R262 Difficulty in walking, not elsewhere classified: Secondary | ICD-10-CM | POA: Insufficient documentation

## 2012-09-03 DIAGNOSIS — IMO0001 Reserved for inherently not codable concepts without codable children: Secondary | ICD-10-CM | POA: Insufficient documentation

## 2012-09-03 DIAGNOSIS — M23329 Other meniscus derangements, posterior horn of medial meniscus, unspecified knee: Secondary | ICD-10-CM | POA: Diagnosis present

## 2012-09-03 DIAGNOSIS — M1710 Unilateral primary osteoarthritis, unspecified knee: Secondary | ICD-10-CM | POA: Diagnosis present

## 2012-09-03 DIAGNOSIS — E119 Type 2 diabetes mellitus without complications: Secondary | ICD-10-CM | POA: Insufficient documentation

## 2012-09-03 DIAGNOSIS — M179 Osteoarthritis of knee, unspecified: Secondary | ICD-10-CM | POA: Diagnosis present

## 2012-09-03 DIAGNOSIS — M25669 Stiffness of unspecified knee, not elsewhere classified: Secondary | ICD-10-CM | POA: Insufficient documentation

## 2012-09-03 DIAGNOSIS — M25661 Stiffness of right knee, not elsewhere classified: Secondary | ICD-10-CM | POA: Insufficient documentation

## 2012-09-03 NOTE — Evaluation (Addendum)
Physical Therapy Evaluation  Patient Details  Name: April Ward MRN: [id number] Date of Birth: 1960/11/19  Today's Date: 09/03/2012 Time: 1355-1420 PT Time Calculation (min): 25 min Charges: Evaluation: 1  TE: 1410-1420              Visit#: 1 of 9  Re-eval: 09/24/12 Assessment Diagnosis: Knee scope Surgical Date: 08/22/12 Next MD Visit: Dr. Romeo Apple - June 17th   Past Medical History:  Past Medical History  Diagnosis Date  . Hypertension   . Diabetes mellitus without complication   . Heart murmur   . Depression   . Anxiety   . GERD (gastroesophageal reflux disease)   . Arthritis    Past Surgical History:  Past Surgical History  Procedure Laterality Date  . Cesarean section  [id number]  . Knee arthroscopy with medial menisectomy Right 08/22/2012    Procedure: KNEE ARTHROSCOPY WITH PARTIAL MEDIAL MENISECTOMY;  Surgeon: Vickki Hearing, MD;  Location: [facility];  Service: Orthopedics;  Laterality: Right;  . Chondroplasty Right 08/22/2012    Procedure: CHONDROPLASTY;  Surgeon: Vickki Hearing, MD;  Location: [facility];  Service: Orthopedics;  Laterality: Right;    Subjective Symptoms/Limitations Pertinent History: Pt is referred to PT s/p knee scope for torn meniscus that happened during a zumba class back in February.  Her c/co is difficulty ambulating independently, and difficulty with doing most household activities or work activitieis independently.   How long can you stand comfortably?: unable to complete independently How long can you walk comfortably?: unable to complete independently Patient Stated Goals: Back to independent with work and the gym.  Pain Assessment Currently in Pain?: Yes Pain Score:   2 Pain Location: Knee Pain Orientation: Right Pain Type: Acute pain Pain Relieving Factors: ice Effect of Pain on Daily Activities: difficulty returning to work, walking independently  Balance Screening Balance Screen Has the patient fallen in the past 6 months:  No Has the patient had a decrease in activity level because of a fear of falling? : Yes Is the patient reluctant to leave their home because of a fear of falling? : No  Prior Functioning  Home Living Lives With: Spouse Prior Function Vocation: Full time employment Vocation Requirements: Engineer, maintenance for [employer] Comments: Enjoys Zumba ,traveling,   Sensation/Coordination/Flexibility/Functional Tests Functional Tests Functional Tests: Lower Extremity Functional Scale: 5/80  Assessment RLE AROM (degrees) Right Knee Extension: 0 Right Knee Flexion: 95 RLE Strength Right Hip Flexion: 4/5 Right Hip Extension: 4/5 Right Hip ABduction: 3+/5 Right Knee Flexion: 3+/5 Right Knee Extension: 4/5 Palpation Palpation: significant nodule to medial scar (likely stitch), decreased patellar mobility.   Mobility/Balance  Ambulation/Gait Ambulation/Gait: Yes Assistive device: Crutches Gait Pattern: Step-through pattern;Decreased stance time - right;Decreased hip/knee flexion - right   Exercise/Treatments Standing Heel Raises: 10 reps;Limitations Heel Raises Limitations: Toe Raises: 10 reps Functional Squat: 10 reps Supine Bridges: 5 reps Straight Leg Raises: Left;5 reps Prone  Hip Extension: Right;5 reps  Physical Therapy Assessment and Plan PT Assessment and Plan Clinical Impression Statement: Pt is a 52 year old female referred to PT s/p knee scope on 5/23 with following impairments listed below.  At this time she continue to ambulate with bil axillary crutches and encouraged to continue with 1 crutch outdoors and independent gait indoors considering pain is low.  Pt will benefit from skilled therapeutic intervention in order to improve on the following deficits: Abnormal gait;Decreased strength;Difficulty walking;Decreased range of motion;Impaired perceived functional ability;Decreased mobility Rehab Potential: Good PT Frequency: Min 3X/week PT  Duration:  (3 weeks) PT  Treatment Plan: Therapuetic exercise, therapeutic activities, balance training, stair training, neuromuscular re-education, manual techniques, modalities PT Plan: Continue with LE strengthing: squats, heel and toe raises, SLR 4 ways Goals Home Exercise Program Pt will Perform Home Exercise Program: Independently PT Goal: Perform Home Exercise Program - Progress: Goal set today PT Short Term Goals Time to Complete Short Term Goals: 3 weeks PT Short Term Goal 1: Pt will report pain less than 1/10 when ambulating indepenently. PT Short Term Goal 2: Pt will improve her dynamic balance and will ambulate on inddor and outdoor surfaces independently for safe return to work.  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 PT Short Term Goal 4: Pt will improve her knee AROM to WNL in order to have greater ease with sit to stand activities.  PT Short Term Goal 5: Pt will be educated on proper exercises she can continue at her gym for safe retun to her gym.   Problem List Patient Active Problem List   Diagnosis Date Noted  . Stiffness of right knee 09/03/2012  . Difficulty in walking(719.7) 09/03/2012  . OA (osteoarthritis) of knee 07/23/2012  . Medial meniscus, posterior horn derangement 07/23/2012    PT - End of Session Activity Tolerance: Patient tolerated treatment well General Behavior During Therapy: WFL for tasks assessed/performed Cognition: WFL for tasks performed PT Plan of Care PT Home Exercise Plan: see scanned report PT Patient Instructions: importance of HEP, gait training, answered questions about diagnosis.  Consulted and Agree with Plan of Care: Patient  GP    Annett Fabian, MPT, ATC 09/03/2012, 3:42 PM  Physician Documentation Your signature is required to indicate approval of the treatment plan as stated above.  Please sign and either send electronically or make a copy of this report for your files and  return this physician signed original.   Please mark one 1.__approve of plan  2. ___approve of plan with the following conditions.   ______________________________                                                          _____________________ Physician Signature                                                                                                             Date

## 2012-09-04 ENCOUNTER — Other Ambulatory Visit: Payer: Self-pay | Admitting: *Deleted

## 2012-09-04 DIAGNOSIS — Z9889 Other specified postprocedural states: Secondary | ICD-10-CM

## 2012-09-04 MED ORDER — HYDROCODONE-ACETAMINOPHEN 7.5-325 MG PO TABS: 1.0000 | ORAL_TABLET | Freq: Four times a day (QID) | ORAL | Status: DC | PRN

## 2012-09-08 ENCOUNTER — Ambulatory Visit (HOSPITAL_COMMUNITY)
Admission: RE | Admit: 2012-09-08 | Discharge: 2012-09-08 | Disposition: A | Discharge status: Home / Self Care | Payer: Self-pay | Source: Ambulatory Visit | Attending: Family Medicine | Admitting: Family Medicine

## 2012-09-08 DIAGNOSIS — M25661 Stiffness of right knee, not elsewhere classified: Secondary | ICD-10-CM

## 2012-09-08 DIAGNOSIS — R262 Difficulty in walking, not elsewhere classified: Secondary | ICD-10-CM

## 2012-09-08 NOTE — Progress Notes (Signed)
Physical Therapy Treatment Patient Details  Name: April Ward MRN: [id number] Date of Birth: [birth year]  Today's Date: 09/08/2012 Time: 1015-1105 PT Time Calculation (min): 50 min Charge: there ex x 40 1015=1055; ;  IP x 10 1055-1105 Visit#: 2 of 9   Seated Long Arc Quad: 10 reps;Limitations Long Arc Quad Limitations: 3# Supine Heel Slides: 10 reps Terminal Knee Extension: 10 reps Bridges: 10 reps Straight Leg Raises: Right;5 reps   Prone  Hip Extension: Right;10 reps   Modalities Modalities: Cryotherapy Cryotherapy Number Minutes Cryotherapy: 10 Minutes Cryotherapy Location: Knee Type of Cryotherapy: Ice pack  Physical Therapy Assessment and Plan PT Assessment and Plan Clinical Impression Statement: Pt becoming dizzy during standing activities. Pt BP is 130/180 pt given ginger ale. Added new exercises with verbal cuing to keep weight equally on rt and lt LE.   Rehab Potential: Good PT Frequency: Min 3X/week PT Plan: begin gt training without AD next treatment    Goals  progressing  Problem List Patient Active Problem List   Diagnosis Date Noted  . Stiffness of right knee 09/03/2012  . Difficulty in walking(719.7) 09/03/2012  . OA (osteoarthritis) of knee 07/23/2012  . Medial meniscus, posterior horn derangement 07/23/2012    PT - End of Session Activity Tolerance: Patient tolerated treatment well General Behavior During Therapy: [facility] for tasks assessed/performed Cognition: WFL for tasks performed  GP    RUSSELL,CINDY 09/08/2012, 10:58 AM

## 2012-09-10 ENCOUNTER — Ambulatory Visit (HOSPITAL_COMMUNITY)
Admission: RE | Admit: 2012-09-10 | Discharge: 2012-09-10 | Disposition: A | Discharge status: Home / Self Care | Payer: Self-pay | Source: Ambulatory Visit | Attending: Orthopedic Surgery | Admitting: Orthopedic Surgery

## 2012-09-10 NOTE — Progress Notes (Signed)
Physical Therapy Treatment Patient Details  Name: CORDA SHUTT MRN: [id number] Date of Birth: 1960/04/21  Today's Date: 09/10/2012 Time: 9629-5284 PT Time Calculation (min): 46 min Visit#: 3 of 9  Charges:  therex 32' 1152-1224, ice 10' 1225-1235    Subjective: Symptoms/Limitations Symptoms: Pt. states she is doing well today and currently without pain. Pain Assessment Currently in Pain?: No/denies   Exercise/Treatments Aerobic Stationary Bike: NuSTep 10', level 4 hills#3, seat 12 no UE Standing Heel Raises: 15 reps Heel Raises Limitations: Toe Raises: 15 reps Lateral Step Up: 10 reps;Right;Step Height: 4";Hand Hold: 1 Forward Step Up: 10 reps;Right;Step Height: 4";Hand Hold: 1 Step Down: 10 reps;Right;Step Height: 4";Hand Hold: 1 Seated Long Arc Quad: 10 reps;Limitations Long Arc Quad Limitations: 4# Supine Bridges: 15 reps Straight Leg Raises: Right;10 reps;Limitations Straight Leg Raises Limitations: 4# Sidelying Hip ABduction: Right;10 reps;Limitations Hip ABduction Limitations: 4# Prone  Hip Extension: Right;10 reps;Limitations Hip Extension Limitations: 4#   Modalities Modalities: Cryotherapy Cryotherapy Number Minutes Cryotherapy: 10 Minutes Cryotherapy Location: Knee  Physical Therapy Assessment and Plan PT Assessment and Plan Clinical Impression Statement: No dizziness reported today during therapy today.  Able to ambulate without AD today without difficulty or gait deviation.  Able to add 4# weight to SLR exercises today in all planes of motion.  Pt. requires vcs to perform exercises correctly without substituion. Rehab Potential: Good PT Frequency: Min 3X/week PT Plan: Continue to progress toward goals.  Add balance activities, SLS.     Problem List Patient Active Problem List   Diagnosis Date Noted  . Stiffness of right knee 09/03/2012  . Difficulty in walking(719.7) 09/03/2012  . OA (osteoarthritis) of knee 07/23/2012  . Medial meniscus,  posterior horn derangement 07/23/2012    PT - End of Session Activity Tolerance: Patient tolerated treatment well General Behavior During Therapy: [facility] for tasks assessed/performed Cognition: WFL for tasks performed   Lurena Nida, PTA/CLT 09/10/2012, 12:31 PM

## 2012-09-15 ENCOUNTER — Ambulatory Visit (HOSPITAL_COMMUNITY)
Admission: RE | Admit: 2012-09-15 | Discharge: 2012-09-15 | Disposition: A | Discharge status: Home / Self Care | Payer: Self-pay | Source: Ambulatory Visit | Attending: Orthopedic Surgery | Admitting: Orthopedic Surgery

## 2012-09-15 NOTE — Progress Notes (Signed)
Physical Therapy Treatment Patient Details  Name: April Ward MRN: [id number] Date of Birth: 05-Sep-1960  Today's Date: 09/15/2012 Time: 1105-1150 PT Time Calculation (min): 45 min  Visit#: 4 of 9  Charges: Therex 28' MMT x 1 Ice x 1   Subjective: Symptoms/Limitations Symptoms: Pt is pain free and reports HEP compliance. Pain Assessment Currently in Pain?: No/denies   Objective: RLE AROM (degrees) Right Knee Extension: 0 (was 0) Right Knee Flexion: 122 (was 95) RLE Strength Right Hip Flexion:  (4+/5 was 4/5) Right Hip Extension: 5/5 Right Hip ABduction:  (4+/5 was 3+/5) Right Knee Flexion:  (4+/5 was 3+/5) Right Knee Extension:  (4+/5 was 4/5)  Exercise/Treatments Standing Heel Raises: 15 reps Heel Raises Limitations: Toe Raises: 15 reps Lateral Step Up: 10 reps;Right;Step Height: 4";Hand Hold: 1 Forward Step Up: 10 reps;Right;Hand Hold: 1;Step Height: 6" Step Down: 10 reps;Right;Step Height: 4";Hand Hold: 1 Rocker Board: 2 minutes   Modalities Modalities: Cryotherapy Cryotherapy Number Minutes Cryotherapy: 10 Minutes Cryotherapy Location: Knee Type of Cryotherapy: Ice pack  Physical Therapy Assessment and Plan PT Assessment and Plan Clinical Impression Statement: Pt had two episodes of dizziness this session. B/P was measured both times (125/78, 120/75). Dizziness subsided with rest. Pt plans to speak with MD about this. Progress note completed prior to MD appt. Ice applied at end of session to limit pain and inflammation. Rehab Potential: Good PT Frequency: Min 3X/week PT Plan: Continue to progress toward goals.  Add balance activities, SLS.    Goals Home Exercise Program Pt will Perform Home Exercise Program: Independently PT Short Term Goals Time to Complete Short Term Goals: 3 weeks PT Short Term Goal 1: Pt will report pain less than 1/10 when ambulating indepenently. (Goes up to 2-3/10 every now and then) PT Short Term Goal 1 - Progress: Progressing  toward goal PT Short Term Goal 2: Pt will improve her dynamic balance and will ambulate on inddor and outdoor surfaces independently for safe return to work.  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 PT Short Term Goal 3 - Progress: Progressing toward goal PT Short Term Goal 4: Pt will improve her knee AROM to WNL in order to have greater ease with sit to stand activities.  PT Short Term Goal 4 - Progress: Met PT Short Term Goal 5: Pt will be educated on proper exercises she can continue at her gym for safe retun to her gym.  PT Short Term Goal 5 - Progress: Progressing toward goal  Problem List Patient Active Problem List   Diagnosis Date Noted  . Stiffness of right knee 09/03/2012  . Difficulty in walking(719.7) 09/03/2012  . OA (osteoarthritis) of knee 07/23/2012  . Medial meniscus, posterior horn derangement 07/23/2012    PT - End of Session Activity Tolerance: Patient tolerated treatment well General Behavior During Therapy: [facility] for tasks assessed/performed Cognition: [facility] for tasks performed  Seth Bake, PTA  09/15/2012, 12:33 PM

## 2012-09-16 ENCOUNTER — Encounter: Payer: Self-pay | Admitting: Orthopedic Surgery

## 2012-09-16 ENCOUNTER — Ambulatory Visit (INDEPENDENT_AMBULATORY_CARE_PROVIDER_SITE_OTHER): Payer: Self-pay | Admitting: Orthopedic Surgery

## 2012-09-16 VITALS — BP 160/90 | Ht 71.5 in | Wt 286.0 lb

## 2012-09-16 DIAGNOSIS — M23329 Other meniscus derangements, posterior horn of medial meniscus, unspecified knee: Secondary | ICD-10-CM

## 2012-09-16 DIAGNOSIS — M23321 Other meniscus derangements, posterior horn of medial meniscus, right knee: Secondary | ICD-10-CM

## 2012-09-16 NOTE — Progress Notes (Signed)
Patient ID: April Ward, female   DOB: [birth year], 52 y.o.   MRN: [id number] Chief Complaint  Patient presents with  . Follow-up    Post op 2 SARK DOS 08/22/12    She is post right knee arthroscopy partial medial meniscectomy with mild arthritis on the medial femoral condyle grade 2 chondral changes  She's currently in therapy doing well she's regained her full range of motion has no swelling just some soreness. She will finish her physical therapy and return to work on June 30 she can start some after July 4  Followup as needed

## 2012-09-16 NOTE — Patient Instructions (Signed)
RTW June 30   [facility] THERAPY

## 2012-09-17 ENCOUNTER — Ambulatory Visit (HOSPITAL_COMMUNITY): Payer: Self-pay | Admitting: Physical Therapy

## 2012-09-19 ENCOUNTER — Ambulatory Visit (HOSPITAL_COMMUNITY): Payer: Self-pay | Admitting: Physical Therapy

## 2012-09-22 ENCOUNTER — Ambulatory Visit (HOSPITAL_COMMUNITY): Payer: Self-pay | Admitting: *Deleted

## 2012-09-24 ENCOUNTER — Inpatient Hospital Stay (HOSPITAL_COMMUNITY): Admission: RE | Admit: 2012-09-24 | Payer: Self-pay | Source: Ambulatory Visit | Admitting: *Deleted

## 2012-09-26 ENCOUNTER — Ambulatory Visit (HOSPITAL_COMMUNITY): Payer: Self-pay | Admitting: Physical Therapy

## 2012-10-27 ENCOUNTER — Other Ambulatory Visit (HOSPITAL_COMMUNITY): Payer: Self-pay | Admitting: Family Medicine

## 2012-10-27 DIAGNOSIS — N289 Disorder of kidney and ureter, unspecified: Secondary | ICD-10-CM

## 2012-10-30 ENCOUNTER — Ambulatory Visit (HOSPITAL_COMMUNITY): Payer: Self-pay

## 2012-10-31 ENCOUNTER — Ambulatory Visit (HOSPITAL_COMMUNITY)
Admission: RE | Admit: 2012-10-31 | Discharge: 2012-10-31 | Disposition: A | Discharge status: Home / Self Care | Payer: BC Managed Care – PPO | Source: Ambulatory Visit | Attending: Family Medicine | Admitting: Family Medicine

## 2012-10-31 DIAGNOSIS — N289 Disorder of kidney and ureter, unspecified: Secondary | ICD-10-CM | POA: Insufficient documentation

## 2012-10-31 DIAGNOSIS — E119 Type 2 diabetes mellitus without complications: Secondary | ICD-10-CM | POA: Insufficient documentation

## 2012-10-31 DIAGNOSIS — Q619 Cystic kidney disease, unspecified: Secondary | ICD-10-CM | POA: Insufficient documentation

## 2012-10-31 DIAGNOSIS — I10 Essential (primary) hypertension: Secondary | ICD-10-CM | POA: Insufficient documentation

## 2012-10-31 HISTORY — DX: Disorder of kidney and ureter, unspecified: N28.9

## 2012-12-22 ENCOUNTER — Ambulatory Visit (HOSPITAL_COMMUNITY): Payer: BC Managed Care – PPO

## 2013-02-05 ENCOUNTER — Other Ambulatory Visit: Payer: Self-pay

## 2013-02-13 DIAGNOSIS — R809 Proteinuria, unspecified: Secondary | ICD-10-CM | POA: Insufficient documentation

## 2013-03-02 HISTORY — PX: PARTIAL NEPHRECTOMY: SHX414

## 2013-04-13 ENCOUNTER — Ambulatory Visit (INDEPENDENT_AMBULATORY_CARE_PROVIDER_SITE_OTHER): Payer: BC Managed Care – PPO | Admitting: Psychiatry

## 2013-04-13 ENCOUNTER — Encounter (HOSPITAL_COMMUNITY): Payer: Self-pay | Admitting: Psychiatry

## 2013-04-13 DIAGNOSIS — F419 Anxiety disorder, unspecified: Secondary | ICD-10-CM

## 2013-04-13 DIAGNOSIS — F32A Depression, unspecified: Secondary | ICD-10-CM

## 2013-04-13 DIAGNOSIS — F411 Generalized anxiety disorder: Secondary | ICD-10-CM

## 2013-04-13 DIAGNOSIS — F329 Major depressive disorder, single episode, unspecified: Secondary | ICD-10-CM

## 2013-04-13 DIAGNOSIS — F3289 Other specified depressive episodes: Secondary | ICD-10-CM

## 2013-04-13 NOTE — Patient Instructions (Signed)
Discussed orally 

## 2013-04-13 NOTE — Progress Notes (Addendum)
Patient:   April Ward   DOB:   July 26, 1960  MR Number:  [id number]  Location:  [address], [place], [place] [id number]  Date of Service:   Monday 04/13/2013  Start Time:   9:25 AM End Time:   10:20 AM  Provider/Observer:  Florencia ReasonsPeggy Kelly Eisler, MSW, LCSW   Billing Code/Service:  [phone]  Chief Complaint:     Chief Complaint  Patient presents with  . Depression  . Anxiety    Reason for Service:  Patient is seeking services due to to experiencing symptoms of depression and anxiety. 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 She says initially, the surgery was scheduled to remove a cyst from her kidney. She states she did not find out until 04/09/2013 how much of the kidney was removed and that it was cancerous. She says oncologist has told her all of the cancer was removed and that she does not need to follow up with him until July, 2015. However, patient is mistrustful of some of the medical providers due to instances involving miscommunication and lack of coordination of care. Patient fears something else could be wrong with her other kidney and possibly needing dialysis. She is scheduled to see her nephrologist on 04/29/2013. She reports additional stress related to to her 2 sisters. Patient and her sisters inherited  the family home after their mother died in January 2013. Her sisters continue to reside in the family home but fail to pay expenses for the home on time which is affecting patient's credit as her name is on some of the utility bills. She also expresses hurt and disappointment that sisters have not been there for her since she has been sick and says they only call if they need help. She reports additional stress related to being the only one in  her home with income. Her husband was injured 2 years ago and has been in the process of applying for disability income. 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 She contracts for services and currently is assigned to a nursing facility in [place], [place]. She resides alone in an apartment in [place] during the week and does not feel she is ready to return to work.   Current Status:  Patient reports depressed mood, anxiety, racing thoughts, ruminating thoughts, sleep difficulty (2-3 hours of sleep per night) excessive worrying, crying spells, loss of interest in activities, and loss of appetite  Reliability of Information: Information gathered from patient - reliable  Behavioral Observation: April Ward  presents as a 53 y.o.-year-old Right-handed African American Female who appeared her stated age. Her dress was appropriate and she was Casual in attire.  Her  manners were appropriate to the situation.  There were not any physical disabilities noted.  She displayed an appropriate level of cooperation and motivation.    Interactions:    Active   Attention:   normal  Memory:   normal  Visuo-spatial:   normal  Speech (Volume):  normal  Speech:   normal pitch and normal volume  Thought Process:  Coherent and Relevant  Though Content:  Rumination  Orientation:   person, place, time/date, situation, day of week, month of year and year  Judgment:   Good  Planning:   Good  Affect:    Anxious, Depressed and Tearful  Mood:    Anxious and Depressed  Insight:  Good  Intelligence:   normal  Marital Status/Living: The patient was born and reared in [place], [place]. She is the oldest of 3 siblings. She describes her childhood as good. The patient and her husband have been married for 20 years. She has a 52 year old daughter who resides in [place]. She also has a 55 year old son who resides with patient and her husband in [place], [place].  Her 52 year-old nephew is residing with her as his mother will not provide his care. Patient has 4 grandchildren. She reports no interest in activities now and  just wanting to stay home but normally enjoys being with her grandchildren and traveling. She also reports she used to work with a youth group at [employer].  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  Substance Use:  No concerns of substance abuse are reported.    Education:   Patient reports obtaining degrees in nursing and occupational therapy from [employer] History:   Past Medical History  Diagnosis Date  . Hypertension   . Diabetes mellitus without complication   . Heart murmur   . Depression   . Anxiety   . GERD (gastroesophageal reflux disease)   . Arthritis   . Renal disease 10/2012    Sexual History:   History  Sexual Activity  . Sexual Activity: Not on file    Abuse/Trauma History: Denies  Psychiatric History:  Patient reports no psychiatric hospitalizations. She reports participating in outpatient psychotherapy in a practice  in [place] for 2 sessions in April or May of 2014 due to experiencing stress related to issues with her sisters. She is taking Xanax as prescribed by her primary care physician. She was prescribed an antidepressant but did not use it as her insurance would not cover the medication.  Family Med/Psych History:  Family History  Problem Relation Age of Onset  . Heart attack Mother   . Heart attack Father    patient suspects her maternal uncles may have had depression and problems with alcohol  Risk of Suicide/Violence: Patient denies any suicidal attempts. She reports having passive suicidal ideations last Thursday saying she wished she was dead with no plan and no intent. She denies current suicidal ideations. She denies past and current homicidal ideations. She reports no history of  self-injurious behaviors, aggression, or violence.  Impression/DX:  The patient presents with symptoms of depression and anxiety. She states she initially became depressed in may of 2014 when she and her sisters began having issues regarding expenses for the family home. Her symptoms began to worsen in December 2015 when she had surgery removing 60% of her kidney as it is cancerous. Patient reports lifelong perfectionistic tendencies and says anxiety has worsened stating that she tends to think too much. Patient's current symptoms include depressed mood, anxiety, racing thoughts, ruminating thoughts, sleep difficulty (2-3 hours of sleep per night) excessive worrying, crying spells, loss of interest in activities, and loss of appetite. Diagnosis: Depressive disorder, rule out MDD, anxiety disorder, rule out GAD  Disposition/Plan:  The patient attends the assessment appointment today. Confidentiality and limits are discussed. The patient agrees to return for an appointment in one to 2 weeks for continuing assessment and treatment planning. The patient also agrees to see psychiatrist Dr. Harrington Challenger for medication evaluation.  The patient agrees to call this practice, call 911, or have someone take her to the ER should symptoms worsen.  Diagnosis:    Axis I:  Depressive disorder  Anxiety disorder      Axis II: Deferred       Axis III:  See medical history      Axis IV:  problems with primary support group,  economic concerns          Axis V:  41-50 serious symptoms

## 2013-04-14 ENCOUNTER — Ambulatory Visit (HOSPITAL_COMMUNITY): Payer: Self-pay | Admitting: Psychiatry

## 2013-04-17 ENCOUNTER — Ambulatory Visit (INDEPENDENT_AMBULATORY_CARE_PROVIDER_SITE_OTHER): Payer: BC Managed Care – PPO | Admitting: Psychiatry

## 2013-04-17 ENCOUNTER — Encounter (HOSPITAL_COMMUNITY): Payer: Self-pay | Admitting: Psychiatry

## 2013-04-17 VITALS — BP 140/84 | Ht 71.0 in | Wt 296.0 lb

## 2013-04-17 DIAGNOSIS — F332 Major depressive disorder, recurrent severe without psychotic features: Secondary | ICD-10-CM

## 2013-04-17 DIAGNOSIS — F411 Generalized anxiety disorder: Secondary | ICD-10-CM

## 2013-04-17 DIAGNOSIS — F329 Major depressive disorder, single episode, unspecified: Secondary | ICD-10-CM | POA: Insufficient documentation

## 2013-04-17 MED ORDER — ALPRAZOLAM 0.5 MG PO TABS: ORAL_TABLET | ORAL | Status: DC

## 2013-04-17 MED ORDER — FLUOXETINE HCL 20 MG PO CAPS: 20.0000 mg | ORAL_CAPSULE | Freq: Every day | ORAL | Status: DC

## 2013-04-17 NOTE — Progress Notes (Signed)
Psychiatric Assessment Adult  Patient Identification:  April Ward Date of Evaluation:  04/17/2013 Chief Complaint: I've been very depressed and anxious since I had renal surgery." History of Chief Complaint:   Chief Complaint  Patient presents with  . Anxiety  . Depression  . Establish Care    Anxiety Symptoms include decreased concentration, nervous/anxious behavior and suicidal ideas.     this patient is a 53 year old married black female who lives with her husband, and 67 year old son and 40 year-old nephew  In [place]. 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  The patient was referred by Maurice Small therapist in our office. The patient states that she had a cyst removed in her left kidney in December. After she had the surgery initially she was told everything was okay. She later found that 60% of her kidney was removed and that she had renal cancer. She's very upset because of the conflicting information. She also developed severe anemia and probably will have to on iron infusion. She now doesn't trust the doctors because she doesn't know what to believe. She is scheduled to see an oncologist to make sure she doesn't need any more treatment for cancer. Her nephrologist so has released her and states she needs to come back in 6 months.  The patient travels 3 hours to her job and stays there in an apartment. She's been doing this since June. Now however she doesn't feel able to function. She's been very depressed since the surgery and the news about the cancer. She's been crying all the time. Her thoughts are racing and she is unable to sleep. She's very tired and doesn't have any appetite. She's had passive suicidal ideation but no specific plan. She doesn't want to be around people because she gets so upset.  The patient had a depressive episode in May. At that time her sisters were upsetting her. Her mother died in  07-31-2011 and left her property to the patient and her 2 sisters. 2 sisters are not good about paying bills and the light bill is in her name. Her primary doctor had put her on  Vibryd which helped but her insurance did not cover it so she is no longer taking it. She's never been on any other antidepressants or had previous psychiatric treatment Review of Systems  Constitutional: Positive for appetite change and fatigue.  Psychiatric/Behavioral: Positive for suicidal ideas, sleep disturbance, dysphoric mood and decreased concentration. The patient is nervous/anxious.    Physical Exam not done Depressive Symptoms: depressed mood, anhedonia, insomnia, psychomotor retardation, fatigue, feelings of worthlessness/guilt, hopelessness, suicidal thoughts without plan, anxiety,  (Hypo) Manic Symptoms:   Elevated Mood:  No Irritable Mood:  No Grandiosity:  No Distractibility:  No Labiality of Mood:  No Delusions:  No Hallucinations:  No Impulsivity:  No Sexually Inappropriate Behavior:  No Financial Extravagance:  No Flight of Ideas:  No  Anxiety Symptoms: Excessive Worry:  Yes Panic Symptoms:  Yes Agoraphobia:  Yes Obsessive Compulsive: No  Symptoms: None, Specific Phobias:  No Social Anxiety:  Yes  Psychotic Symptoms:  Hallucinations: No None Delusions:  No Paranoia:  No   Ideas of Reference:  No  PTSD Symptoms: Ever had a traumatic exposure:  No Had a traumatic exposure in the last month:  No Re-experiencing: No None Hypervigilance:  No Hyperarousal: No None Avoidance: No None  Traumatic Brain Injury: No   Past Psychiatric History: Diagnosis: Maj. depression  Hospitalizations: None   Outpatient Care: None   Substance Abuse Care: None   Self-Mutilation: None   Suicidal Attempts: None   Violent Behaviors: None    Past Medical History:   Past Medical History  Diagnosis Date  . Hypertension   . Diabetes mellitus without complication   . Heart murmur   .  Depression   . Anxiety   . GERD (gastroesophageal reflux disease)   . Arthritis   . Renal disease 10/2012  . Renal cancer    History of Loss of Consciousness:  No Seizure History:  No Cardiac History:  No Allergies:   Allergies  Allergen Reactions  . Skelaxin [Metaxalone] Hives   Current Medications:  Current Outpatient Prescriptions  Medication Sig Dispense Refill  . ALLOPURINOL PO Take by mouth.      . ALPRAZolam (XANAX) 0.5 MG tablet Take one twice a day and two at bedtime  120 tablet  2  . carvedilol (COREG) 6.25 MG tablet Take 6.25 mg by mouth 2 (two) times daily with a meal.      . hydrALAZINE (APRESOLINE) 10 MG tablet Take 20 mg by mouth 3 (three) times daily.      . Iron-FA-B Cmp-C-Biot-Probiotic (FUSION PLUS) CAPS Take by mouth.      . linagliptin (TRADJENTA) 5 MG TABS tablet Take 5 mg by mouth daily.      Marland Kitchen lisinopril (PRINIVIL,ZESTRIL) 40 MG tablet Take 40 mg by mouth daily.      Marland Kitchen omeprazole (PRILOSEC) 20 MG capsule Take 20 mg by mouth daily.      Marland Kitchen amLODipine-olmesartan (AZOR) 10-40 MG per tablet Take 1 tablet by mouth daily.      Marland Kitchen FLUoxetine (PROZAC) 20 MG capsule Take 1 capsule (20 mg total) by mouth daily.  30 capsule  2  . furosemide (LASIX) 20 MG tablet Take 20 mg by mouth daily.       Marland Kitchen HYDROcodone-acetaminophen (NORCO) 7.5-325 MG per tablet Take 1 tablet by mouth every 6 (six) hours as needed for pain.  42 tablet  2   No current facility-administered medications for this visit.    Previous Psychotropic Medications:  Medication Dose   Xanax   0.5 mg 3 times a day   Vibryd  40 mg every morning                   Substance Abuse History in the last 12 months: Substance Age of 1st Use Last Use Amount Specific Type  Nicotine      Alcohol      Cannabis      Opiates      Cocaine      Methamphetamines      LSD      Ecstasy      Benzodiazepines      Caffeine      Inhalants      Others:                          Medical Consequences of Substance  Abuse:n/a  Legal Consequences of Substance Abuse: n/a  Family Consequences of Substance Abuse: n/a  Blackouts:  No DT's:  No Withdrawal Symptoms:  No None  Social History: Current Place of Residence: [place] of Birth: [place] Family Members: Husband 2 children, one nephew 4 grandchildren Marital Status:  Married Children:   Sons: 1  Daughters: 1 Relationships:  Education:  Dentist Problems/Performance:  Religious Beliefs/Practices: Christian History of  Abuse: none Occupational Experiences; has degrees in occupational therapy and nursing Military History:  None. Legal History: None Hobbies/Interests: Traveling, visiting with grandchildren  Family History:   Family History  Problem Relation Age of Onset  . Heart attack Mother   . Heart attack Father   . Depression Paternal Aunt   . Alcohol abuse Maternal Uncle     Mental Status Examination/Evaluation: Objective:  Appearance: Casual and Fairly Groomed  Patent attorneyye Contact::  Fair  Speech:  Clear and Coherent  Volume:  Normal  Mood:  Very depressed anxious and tearful   Affect:  Depressed  Thought Process:  Intact  Orientation:  Full (Time, Place, and Person)  Thought Content:  WDL  Suicidal Thoughts:  Yes.  without intent/plan  Homicidal Thoughts:  No  Judgement:  Intact  Insight:  Good  Psychomotor Activity:  Normal  Akathisia:  No  Handed:  Right  AIMS (if indicated):    Assets:  Communication Skills Desire for Improvement    Laboratory/X-Ray Psychological Evaluation(s)       Assessment:  Axis I: Generalized Anxiety Disorder and Major Depression, Recurrent severe  AXIS I Generalized Anxiety Disorder and Major Depression, Recurrent severe  AXIS II Deferred  AXIS III Past Medical History  Diagnosis Date  . Hypertension   . Diabetes mellitus without complication   . Heart murmur   . Depression   . Anxiety   . GERD (gastroesophageal reflux disease)   . Arthritis    . Renal disease 10/2012  . Renal cancer      AXIS IV other psychosocial or environmental problems  AXIS V 51-60 moderate symptoms   Treatment Plan/Recommendations:  Plan of Care: Medication management   Laboratory:   Psychotherapy: The patient is seeing Florencia Reasonseggy Bynum here   Medications: Because of cost she will start Prozac 20 mg every morning for depression, she'll increase Xanax to 0.5 mg twice a day for anxiety and 1 mg at bedtime to help with sleep   Routine PRN Medications:  No  Consultations:   Safety Concerns:  She contracts for safety   Other:  She is not mentally stable enough to return to work. Her ability to work will be reevaluated in 4 weeks when she returns     Diannia RuderOSS, Promise Bushong, MD 1/16/201510:00 AM

## 2013-04-20 ENCOUNTER — Telehealth (HOSPITAL_COMMUNITY): Payer: Self-pay | Admitting: Psychiatry

## 2013-04-20 NOTE — Telephone Encounter (Signed)
Forms done last week

## 2013-04-22 ENCOUNTER — Telehealth (HOSPITAL_COMMUNITY): Payer: Self-pay

## 2013-04-22 NOTE — Telephone Encounter (Signed)
ok 

## 2013-04-28 ENCOUNTER — Ambulatory Visit (INDEPENDENT_AMBULATORY_CARE_PROVIDER_SITE_OTHER): Payer: BC Managed Care – PPO | Admitting: Psychiatry

## 2013-04-28 DIAGNOSIS — F411 Generalized anxiety disorder: Secondary | ICD-10-CM

## 2013-04-28 DIAGNOSIS — F329 Major depressive disorder, single episode, unspecified: Secondary | ICD-10-CM

## 2013-04-28 NOTE — Progress Notes (Signed)
Patient:  April Ward   DOB: 07-08-1960  MR Number: [id number]  Location: [place]:  [address] [place],  [place], [address]  Start: Tuesday 04/28/2013 9:05 AM End: Tuesday 04/28/2013 10:00 AM  Provider/Observer:     Maurice Small, MSW, LCSW   Chief Complaint:      Chief Complaint  Patient presents with  . Anxiety  . Depression    Reason For Service:     Patient is seeking services due to to experiencing symptoms of depression and anxiety. 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 She says initially, the surgery was scheduled to remove a cyst from her kidney. She states she did not find out until 04/09/2013 how much of the kidney was removed and that it was cancerous. She says oncologist has told her all of the cancer was removed and that she does not need to follow up with him until July, 2015. However, patient is mistrustful of some of the medical providers due to instances involving miscommunication and lack of coordination of care. Patient fears something else could be wrong with her other kidney and possibly needing dialysis. She is scheduled to see her nephrologist on 04/29/2013. She reports additional stress related to to her 2 sisters. Patient and her sisters inherited the family home after their mother died in 04-08-2011. Her sisters continue to reside in the family home but fail to pay expenses for the home on time which is affecting patient's credit as her name is on some of the utility bills. She also expresses hurt and disappointment that sisters have not been there for her since she has been sick and says they only call if they need help. She reports additional stress related to being the only one in her home with income. Her husband was injured 2 years ago and has been in the process of applying for disability income. 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 She contracts for services and currently is assigned to a nursing facility in [place], [place]. She resides alone in an apartment in [place] during the week and does not feel she is ready to return to work. Patient is seen today for follow up appointment.   Interventions Strategy:  Supportive therapy  Participation Level:   Active  Participation Quality:  Appropriate      Behavioral Observation:  Casual, Alert, and Tearful.   Current Psychosocial Factors: Concerns regarding health and oncologist cancelling appointment and not rescheduling, conflict with sister  Content of Session:   Establishing rapport, reviewing symptoms, processing feelings, discussing boundary issues, identifying ways to improve self-care, exploring relaxation techniques, practicing relaxation breathing  Current Status:   Patient reports continued anxiety, excessive worry, ruminating thoughts, and sleep difficulty  Patient Progress:   Patient reports continued stress regarding health issues. She expresses frustration her oncologist recently cancelled her appointment and indicated a followup appointment was not needed. She continues to have trust issues and expresses frustration that she has questions that she wanted to address during her appointment. However, patient has an appointment with her nephrologist tomorrow and hopes to have some of her questions addressed at that time. Patient continues to fear that she may have other medical problems that may have been overlooked. She reports additional stress related to her middle sister allowing her boyfriend who is addicted to crack to stay at the family  home last night. Patient reports her middle sister tried to allow this boyfriend to live at the family home last year. However patient told him he couldn't stay. She also expresses frustration with sister as she  is not taking care of her 43 year old son who resides with patient. Patient also worries about  his emotional health as she is concerned that he will be damaged emotionally by sister's choices. Patient reports feeling overwhelmed with issues related to maintain in the family home, her deceased grandparents' farm, and keeping the family together. Therapist works with patient to process her feelings and to begin to explore boundary issues. Therapist also works with patient to identify ways to improve self-care. Patient agrees to use plan your day handouts and bring to next session.  Target Goals:   Establishing therapeutic alliance, decreasing anxiety  Last Reviewed:     Goals Addressed Today:    Establishing therapeutic alliance, decreasing anxiety  Impression/Diagnosis:   Impression/DX: The patient presents with symptoms of depression and anxiety. She states she initially became depressed in may of 2014 when she and her sisters began having issues regarding expenses for the family home. Her symptoms began to worsen in December 53 when she had surgery removing 60% of her kidney as it is cancerous. Patient reports lifelong perfectionistic tendencies and says anxiety has worsened stating that she tends to think too much. Patient's current symptoms include depressed mood, anxiety, racing thoughts, ruminating thoughts, sleep difficulty (2-3 hours of sleep per night) excessive worrying, crying spells, loss of interest in activities, and loss of appetite. Diagnosis: Depressive disorder, rule out MDD, anxiety disorder, rule out GAD      Diagnosis:  Axis I: Major depressive disorder  GAD (generalized anxiety disorder)          Axis II: Deferred

## 2013-04-28 NOTE — Patient Instructions (Signed)
Discussed orally 

## 2013-04-29 DIAGNOSIS — C649 Malignant neoplasm of unspecified kidney, except renal pelvis: Secondary | ICD-10-CM | POA: Insufficient documentation

## 2013-04-29 DIAGNOSIS — N051 Unspecified nephritic syndrome with focal and segmental glomerular lesions: Secondary | ICD-10-CM | POA: Insufficient documentation

## 2013-05-14 ENCOUNTER — Ambulatory Visit (HOSPITAL_COMMUNITY): Payer: Self-pay | Admitting: Psychiatry

## 2013-05-15 ENCOUNTER — Encounter (HOSPITAL_COMMUNITY): Payer: Self-pay | Admitting: Psychiatry

## 2013-05-19 ENCOUNTER — Ambulatory Visit (HOSPITAL_COMMUNITY): Payer: Self-pay | Admitting: Psychiatry

## 2013-05-21 ENCOUNTER — Encounter (HOSPITAL_COMMUNITY): Payer: Self-pay | Admitting: Psychiatry

## 2013-05-21 ENCOUNTER — Ambulatory Visit (INDEPENDENT_AMBULATORY_CARE_PROVIDER_SITE_OTHER): Payer: BC Managed Care – PPO | Admitting: Psychiatry

## 2013-05-21 VITALS — BP 140/80 | Ht 71.0 in | Wt 290.0 lb

## 2013-05-21 DIAGNOSIS — F329 Major depressive disorder, single episode, unspecified: Secondary | ICD-10-CM

## 2013-05-21 MED ORDER — FLUOXETINE HCL 20 MG PO CAPS: ORAL_CAPSULE | ORAL | Status: DC

## 2013-05-21 NOTE — Progress Notes (Signed)
Patient ID: April Ward, female   DOB: 1961-03-02, 53 y.o.   MRN: [id number]  Psychiatric Assessment Adult  Patient Identification:  April Ward Date of Evaluation:  05/21/2013 Chief Complaint: I've been very depressed and anxious since I had renal surgery." History of Chief Complaint:   Chief Complaint  Patient presents with  . Anxiety  . Depression  . Follow-up    Anxiety Symptoms include decreased concentration, nervous/anxious behavior and suicidal ideas.     this patient is a 53 year old married black female who lives with her husband, and 53 year old son and 53 year-old nephew  In [place]. 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  The patient was referred by Florencia ReasonsPeggy Bynum therapist in our office. The patient states that she had a cyst removed in her left kidney in December. After she had the surgery initially she was told everything was okay. She later found that 60% of her kidney was removed and that she had renal cancer. She's very upset because of the conflicting information. She also developed severe anemia and probably will have to on iron infusion. She now doesn't trust the doctors because she doesn't know what to believe. She is scheduled to see an oncologist to make sure she doesn't need any more treatment for cancer. Her nephrologist so has released her and states she needs to come back in 6 months.  The patient travels 3 hours to her job and stays there in an apartment. She's been doing this since June. Now however she doesn't feel able to function. She's been very depressed since the surgery and the news about the cancer. She's been crying all the time. Her thoughts are racing and she is unable to sleep. She's very tired and doesn't have any appetite. She's had passive suicidal ideation but no specific plan. She doesn't want to be around people because she gets so upset.  The patient had a depressive episode  in May. At that time her sisters were upsetting her. Her mother died in 2013 and left her property to the patient and her 2 sisters. 2 sisters are not good about paying bills and the light bill is in her name. Her primary doctor had put her on  Vibryd which helped but her insurance did not cover it so she is no longer taking it. She's never been on any other antidepressants or had previous psychiatric treatment  The patient returns after 4 weeks. She still depressed and not sleeping well. She is taking the Prozac at night it seems to be keeping her awake. She still worried about her renal situation because her biopsy showed renal cancer. It was removed but she has had an MRI in May and she worries that her other kidney may not be very functional either. She has low energy it and stays in bed most of the day. She is quite worried and anxious. She denies being suicidal Review of Systems  Constitutional: Positive for appetite change and fatigue.  Psychiatric/Behavioral: Positive for suicidal ideas, sleep disturbance, dysphoric mood and decreased concentration. The patient is nervous/anxious.    Physical Exam not done Depressive Symptoms: depressed mood, anhedonia, insomnia, psychomotor retardation, fatigue, feelings of worthlessness/guilt, hopelessness, suicidal thoughts without plan, anxiety,  (Hypo) Manic Symptoms:   Elevated Mood:  No Irritable Mood:  No Grandiosity:  No Distractibility:  No Labiality of Mood:  No Delusions:  No Hallucinations:  No Impulsivity:  No Sexually Inappropriate Behavior:  No Financial Extravagance:  No Flight of Ideas:  No  Anxiety Symptoms: Excessive Worry:  Yes Panic Symptoms:  Yes Agoraphobia:  Yes Obsessive Compulsive: No  Symptoms: None, Specific Phobias:  No Social Anxiety:  Yes  Psychotic Symptoms:  Hallucinations: No None Delusions:  No Paranoia:  No   Ideas of Reference:  No  PTSD Symptoms: Ever had a traumatic exposure:  No Had a  traumatic exposure in the last month:  No Re-experiencing: No None Hypervigilance:  No Hyperarousal: No None Avoidance: No None  Traumatic Brain Injury: No   Past Psychiatric History: Diagnosis: Maj. depression   Hospitalizations: None   Outpatient Care: None   Substance Abuse Care: None   Self-Mutilation: None   Suicidal Attempts: None   Violent Behaviors: None    Past Medical History:   Past Medical History  Diagnosis Date  . Hypertension   . Diabetes mellitus without complication   . Heart murmur   . Depression   . Anxiety   . GERD (gastroesophageal reflux disease)   . Arthritis   . Renal disease 10/2012  . Renal cancer    History of Loss of Consciousness:  No Seizure History:  No Cardiac History:  No Allergies:   Allergies  Allergen Reactions  . Skelaxin [Metaxalone] Hives   Current Medications:  Current Outpatient Prescriptions  Medication Sig Dispense Refill  . ALLOPURINOL PO Take by mouth.      . ALPRAZolam (XANAX) 0.5 MG tablet Take one twice a day and two at bedtime  120 tablet  2  . amLODipine-olmesartan (AZOR) 10-40 MG per tablet Take 1 tablet by mouth daily.      . carvedilol (COREG) 6.25 MG tablet Take 6.25 mg by mouth 2 (two) times daily with a meal.      . FLUoxetine (PROZAC) 20 MG capsule Take two in the am  60 capsule  2  . furosemide (LASIX) 20 MG tablet Take 20 mg by mouth daily.       . hydrALAZINE (APRESOLINE) 10 MG tablet Take 20 mg by mouth 3 (three) times daily.      Marland Kitchen HYDROcodone-acetaminophen (NORCO) 7.5-325 MG per tablet Take 1 tablet by mouth every 6 (six) hours as needed for pain.  42 tablet  2  . Iron-FA-B Cmp-C-Biot-Probiotic (FUSION PLUS) CAPS Take by mouth.      . linagliptin (TRADJENTA) 5 MG TABS tablet Take 5 mg by mouth daily.      Marland Kitchen lisinopril (PRINIVIL,ZESTRIL) 40 MG tablet Take 40 mg by mouth daily.      Marland Kitchen omeprazole (PRILOSEC) 20 MG capsule Take 20 mg by mouth daily.       No current facility-administered medications for  this visit.    Previous Psychotropic Medications:  Medication Dose   Xanax   0.5 mg 3 times a day   Vibryd  40 mg every morning                   Substance Abuse History in the last 12 months: Substance Age of 1st Use Last Use Amount Specific Type  Nicotine      Alcohol      Cannabis      Opiates      Cocaine      Methamphetamines      LSD      Ecstasy      Benzodiazepines      Caffeine      Inhalants      Others:  Medical Consequences of Substance Abuse:n/a  Legal Consequences of Substance Abuse: n/a  Family Consequences of Substance Abuse: n/a  Blackouts:  No DT's:  No Withdrawal Symptoms:  No None  Social History: Current Place of Residence: [place] of Birth: [place] Family Members: Husband 2 children, one nephew 4 grandchildren Marital Status:  Married Children:   Sons: 1  Daughters: 1 Relationships:  Education:  Dentist Problems/Performance:  Religious Beliefs/Practices: Christian History of Abuse: none Occupational Experiences; 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 Legal History: None Hobbies/Interests: Traveling, visiting with grandchildren  Family History:   Family History  Problem Relation Age of Onset  . Heart attack Mother   . Heart attack Father   . Depression Paternal Aunt   . Alcohol abuse Maternal Uncle     Mental Status Examination/Evaluation: Objective:  Appearance: Casual and Fairly Groomed  Engineer, water::  Fair  Speech:  Clear and Coherent  Volume:  Normal  Mood:  depressed anxious and tearful   Affect:  Depressed  Thought Process:  Intact  Orientation:  Full (Time, Place, and Person)  Thought Content:  WDL  Suicidal Thoughts:  Yes.  without intent/plan  Homicidal Thoughts:  No  Judgement:  Intact  Insight:  Good  Psychomotor Activity:  Normal  Akathisia:  No  Handed:  Right  AIMS (if indicated):    Assets:   Communication Skills Desire for Improvement    Laboratory/X-Ray Psychological Evaluation(s)       Assessment:  Axis I: Generalized Anxiety Disorder and Major Depression, Recurrent severe  AXIS I Generalized Anxiety Disorder and Major Depression, Recurrent severe  AXIS II Deferred  AXIS III Past Medical History  Diagnosis Date  . Hypertension   . Diabetes mellitus without complication   . Heart murmur   . Depression   . Anxiety   . GERD (gastroesophageal reflux disease)   . Arthritis   . Renal disease 10/2012  . Renal cancer      AXIS IV other psychosocial or environmental problems  AXIS V 51-60 moderate symptoms   Treatment Plan/Recommendations:  Plan of Care: Medication management   Laboratory:   Psychotherapy: The patient is seeing Maurice Small here   Medications:  she will increase Prozac to 40 mg and make sure she takes it in the morning for depression, she'll continue Xanax to 0.5 mg twice a day for anxiety and 1 mg at bedtime to help with sleep   Routine PRN Medications:  No  Consultations:   Safety Concerns:  She contracts for safety   Other:  She is not mentally stable enough to return to work. Her ability to work will be reevaluated in 4 weeks when she returns     [name], MD 2/19/201510:51 AM

## 2013-06-18 ENCOUNTER — Ambulatory Visit (INDEPENDENT_AMBULATORY_CARE_PROVIDER_SITE_OTHER): Payer: BC Managed Care – PPO | Admitting: Psychiatry

## 2013-06-18 ENCOUNTER — Encounter (HOSPITAL_COMMUNITY): Payer: Self-pay | Admitting: Psychiatry

## 2013-06-18 VITALS — BP 140/80 | Ht 71.0 in | Wt 283.0 lb

## 2013-06-18 DIAGNOSIS — F332 Major depressive disorder, recurrent severe without psychotic features: Secondary | ICD-10-CM

## 2013-06-18 DIAGNOSIS — F411 Generalized anxiety disorder: Secondary | ICD-10-CM

## 2013-06-18 DIAGNOSIS — F329 Major depressive disorder, single episode, unspecified: Secondary | ICD-10-CM

## 2013-06-18 MED ORDER — BUPROPION HCL ER (XL) 150 MG PO TB24: 150.0000 mg | ORAL_TABLET | ORAL | Status: DC

## 2013-06-18 MED ORDER — PRAZOSIN HCL 2 MG PO CAPS: 2.0000 mg | ORAL_CAPSULE | Freq: Every day | ORAL | Status: DC

## 2013-06-18 MED ORDER — FLUOXETINE HCL 20 MG PO CAPS: 20.0000 mg | ORAL_CAPSULE | Freq: Every day | ORAL | Status: DC

## 2013-06-18 MED ORDER — ALPRAZOLAM 0.5 MG PO TABS: ORAL_TABLET | ORAL | Status: DC

## 2013-06-18 NOTE — Progress Notes (Signed)
Patient ID: April Ward, female   DOB: 1960/11/28, 53 y.o.   MRN: [id number] Patient ID: April Ward, female   DOB: 03/26/1961, 53 y.o.   MRN: [id number]  Psychiatric Assessment Adult  Patient Identification:  April Ward Date of Evaluation:  06/18/2013 Chief Complaint: I've been very depressed and anxious since I had renal surgery." History of Chief Complaint:   Chief Complaint  Patient presents with  . Anxiety  . Depression  . Follow-up    Anxiety Symptoms include decreased concentration, nervous/anxious behavior and suicidal ideas.     this patient is a 53 year old married black female who lives with her husband, and 58 year old son and 46 year-old nephew  In [place]. 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  The patient was referred by Maurice Small therapist in our office. The patient states that she had a cyst removed in her left kidney in December. After she had the surgery initially she was told everything was okay. She later found that 60% of her kidney was removed and that she had renal cancer. She's very upset because of the conflicting information. She also developed severe anemia and probably will have to on iron infusion. She now doesn't trust the doctors because she doesn't know what to believe. She is scheduled to see an oncologist to make sure she doesn't need any more treatment for cancer. Her nephrologist so has released her and states she needs to come back in 6 months.  The patient travels 3 hours to her job and stays there in an apartment. She's been doing this since June. Now however she doesn't feel able to function. She's been very depressed since the surgery and the news about the cancer. She's been crying all the time. Her thoughts are racing and she is unable to sleep. She's very tired and doesn't have any appetite. She's had passive suicidal ideation but no specific plan. She doesn't want to be  around people because she gets so upset.  The patient had a depressive episode in May. At that time her sisters were upsetting her. Her mother died in 08/01/11 and left her property to the patient and her 2 sisters. 2 sisters are not good about paying bills and the light bill is in her name. Her primary doctor had put her on  Vibryd which helped but her insurance did not cover it so she is no longer taking it. She's never been on any other antidepressants or had previous psychiatric treatment  The patient returns after 4 weeks. She still depressed sad. She's very worried about her renal function. Right now her nephrologist is watching it and she's not getting any specific treatment. She she worries that she may need dialysis in the future. She has absolutely no energy and spends most of her time in bed. The increased Prozac hasn't helped that much. The Xanax does help to some degree with anxiety but she still having a lot of nightmares. She's not suicidal Review of Systems  Constitutional: Positive for appetite change and fatigue.  Psychiatric/Behavioral: Positive for suicidal ideas, sleep disturbance, dysphoric mood and decreased concentration. The patient is nervous/anxious.    Physical Exam not done Depressive Symptoms: depressed mood, anhedonia, insomnia, psychomotor retardation, fatigue, feelings of worthlessness/guilt, hopelessness, suicidal thoughts without plan, anxiety,  (Hypo) Manic Symptoms:   Elevated Mood:  No Irritable Mood:  No Grandiosity:  No Distractibility:  No Labiality of Mood:  No Delusions:  No Hallucinations:  No Impulsivity:  No Sexually Inappropriate Behavior:  No Financial Extravagance:  No Flight of Ideas:  No  Anxiety Symptoms: Excessive Worry:  Yes Panic Symptoms:  Yes Agoraphobia:  Yes Obsessive Compulsive: No  Symptoms: None, Specific Phobias:  No Social Anxiety:  Yes  Psychotic Symptoms:  Hallucinations: No None Delusions:  No Paranoia:  No    Ideas of Reference:  No  PTSD Symptoms: Ever had a traumatic exposure:  No Had a traumatic exposure in the last month:  No Re-experiencing: No None Hypervigilance:  No Hyperarousal: No None Avoidance: No None  Traumatic Brain Injury: No   Past Psychiatric History: Diagnosis: Maj. depression   Hospitalizations: None   Outpatient Care: None   Substance Abuse Care: None   Self-Mutilation: None   Suicidal Attempts: None   Violent Behaviors: None    Past Medical History:   Past Medical History  Diagnosis Date  . Hypertension   . Diabetes mellitus without complication   . Heart murmur   . Depression   . Anxiety   . GERD (gastroesophageal reflux disease)   . Arthritis   . Renal disease 10/2012  . Renal cancer    History of Loss of Consciousness:  No Seizure History:  No Cardiac History:  No Allergies:   Allergies  Allergen Reactions  . Skelaxin [Metaxalone] Hives   Current Medications:  Current Outpatient Prescriptions  Medication Sig Dispense Refill  . ALLOPURINOL PO Take by mouth.      . ALPRAZolam (XANAX) 0.5 MG tablet Take one twice a day and two at bedtime  120 tablet  2  . amLODipine-olmesartan (AZOR) 10-40 MG per tablet Take 1 tablet by mouth daily.      Marland Kitchen buPROPion (WELLBUTRIN XL) 150 MG 24 hr tablet Take 1 tablet (150 mg total) by mouth every morning.  30 tablet  2  . carvedilol (COREG) 6.25 MG tablet Take 6.25 mg by mouth 2 (two) times daily with a meal.      . FLUoxetine (PROZAC) 20 MG capsule Take 1 capsule (20 mg total) by mouth daily.  30 capsule  2  . furosemide (LASIX) 20 MG tablet Take 20 mg by mouth daily.       . hydrALAZINE (APRESOLINE) 10 MG tablet Take 20 mg by mouth 3 (three) times daily.      Marland Kitchen HYDROcodone-acetaminophen (NORCO) 7.5-325 MG per tablet Take 1 tablet by mouth every 6 (six) hours as needed for pain.  42 tablet  2  . Iron-FA-B Cmp-C-Biot-Probiotic (FUSION PLUS) CAPS Take by mouth.      . linagliptin (TRADJENTA) 5 MG TABS tablet Take  5 mg by mouth daily.      Marland Kitchen lisinopril (PRINIVIL,ZESTRIL) 40 MG tablet Take 40 mg by mouth daily.      Marland Kitchen omeprazole (PRILOSEC) 20 MG capsule Take 20 mg by mouth daily.      . prazosin (MINIPRESS) 2 MG capsule Take 1 capsule (2 mg total) by mouth at bedtime.  30 capsule  2   No current facility-administered medications for this visit.    Previous Psychotropic Medications:  Medication Dose   Xanax   0.5 mg 3 times a day   Vibryd  40 mg every morning                   Substance Abuse History in the last 12 months: Substance Age of 1st Use Last Use Amount Specific Type  Nicotine      Alcohol  Cannabis      Opiates      Cocaine      Methamphetamines      LSD      Ecstasy      Benzodiazepines      Caffeine      Inhalants      Others:                          Medical Consequences of Substance Abuse:n/a  Legal Consequences of Substance Abuse: n/a  Family Consequences of Substance Abuse: n/a  Blackouts:  No DT's:  No Withdrawal Symptoms:  No None  Social History: Current Place of Residence: [place] of Birth: [place] Family Members: Husband 2 children, one nephew 4 grandchildren Marital Status:  Married Children:   Sons: 1  Daughters: 1 Relationships:  Education:  Dentist Problems/Performance:  Religious Beliefs/Practices: Christian History of Abuse: none Occupational Experiences; 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 Legal History: None Hobbies/Interests: Traveling, visiting with grandchildren  Family History:   Family History  Problem Relation Age of Onset  . Heart attack Mother   . Heart attack Father   . Depression Paternal Aunt   . Alcohol abuse Maternal Uncle     Mental Status Examination/Evaluation: Objective:  Appearance: Casual and Fairly Groomed  Engineer, water::  Fair  Speech:  Clear and Coherent  Volume:  Normal  Mood:  depressed anxious  Affect:  Depressed   Thought Process:  Intact  Orientation:  Full (Time, Place, and Person)  Thought Content:  WDL  Suicidal Thoughts:  Yes.  without intent/plan  Homicidal Thoughts:  No  Judgement:  Intact  Insight:  Good  Psychomotor Activity:  Normal  Akathisia:  No  Handed:  Right  AIMS (if indicated):    Assets:  Communication Skills Desire for Improvement    Laboratory/X-Ray Psychological Evaluation(s)       Assessment:  Axis I: Generalized Anxiety Disorder and Major Depression, Recurrent severe  AXIS I Generalized Anxiety Disorder and Major Depression, Recurrent severe  AXIS II Deferred  AXIS III Past Medical History  Diagnosis Date  . Hypertension   . Diabetes mellitus without complication   . Heart murmur   . Depression   . Anxiety   . GERD (gastroesophageal reflux disease)   . Arthritis   . Renal disease 10/2012  . Renal cancer      AXIS IV other psychosocial or environmental problems  AXIS V 51-60 moderate symptoms   Treatment Plan/Recommendations:  Plan of Care: Medication management   Laboratory:   Psychotherapy: The patient is seeing Maurice Small here   Medications:  Since the increase in Prozac hasn't helped we'll go back down to 20 mg every morning. We'll add Wellbutrin XL 150 mg every morning for energy and depression. We'll also add prazosin 2 mg each bedtime to help with nightmares she'll continue Xanax to 0.5 mg twice a day for anxiety and 1 mg at bedtime to help with sleep   Routine PRN Medications:  No  Consultations:   Safety Concerns:  She contracts for safety   Other:  She is not mentally stable enough to return to work. Her ability to work will be reevaluated in 4 weeks when she returns     [name], MD 3/19/201510:33 AM

## 2013-07-07 ENCOUNTER — Telehealth: Payer: Self-pay | Admitting: Gastroenterology

## 2013-07-07 ENCOUNTER — Ambulatory Visit (INDEPENDENT_AMBULATORY_CARE_PROVIDER_SITE_OTHER): Payer: BC Managed Care – PPO | Admitting: Psychiatry

## 2013-07-07 DIAGNOSIS — F329 Major depressive disorder, single episode, unspecified: Secondary | ICD-10-CM

## 2013-07-07 DIAGNOSIS — F411 Generalized anxiety disorder: Secondary | ICD-10-CM

## 2013-07-07 NOTE — Progress Notes (Signed)
   THERAPIST PROGRESS NOTE  Session Time: Tuesday 07/07/2013 11:20 AM - 12:15 PM  Participation Level: Active  Behavioral Response: Fairly GroomedAlertAnxious and Depressed  Type of Therapy: Individual Therapy  Treatment Goals addressed: Improve self-care, improve ability to manage stress and anxiety  Interventions: CBT and Supportive  Summary: April Ward is a 53 y.o. female who presents with symptoms of depression and anxiety. She states she initially became depressed in May of 2014 when she and her sisters began having issues regarding expenses for the family home. 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 Patient reports lifelong perfectionistic tendencies and says anxiety has worsened stating that she tends to think too much. Patient's symptoms included depressed mood, anxiety, racing thoughts, ruminating thoughts, sleep difficulty (2-3 hours of sleep per night) excessive worrying, crying spells, loss of interest in activities, and loss of appetite.    Patient reports little to no change in symptoms since her last session 3 months ago. She continues to experience depressed mood or reports no interest in activities. She reports staying in bed most of the day 5/7 days. She is experiencing hypersomnia and loss of appetite. She also reports excessive worry about her health and her job. She is being pressured to return to her job in 2 weeks per patient's report. However, she reports working in a stressful an oppressive work environment and fears she will have difficulty controlling her blood pressure. Per patient's report, her creatinine level already is elevated and she fears elevated blood pressure would have an adverse effect on her kidneys. She fears she will have renal failure and need dialysis.    Suicidal/Homicidal: Patient reports having fleeting suicidal ideations of overdosing on pills about a month ago. She reports  her 63 year old-son now manages her medication. She denies having any suicidal ideations since that time. Patient agrees to call this practice, call 911, or have someone take her to the ER should symptoms worsen.  Therapist Response: Therapist works with patient to process feelings, examine her thought patterns and effects on mood and behavior, and identify ways to improve self-care and increase involvement in activity.  Plan: Return again in 2 weeks. Patient agrees to complete plan your day handouts angry to next session.  Diagnosis: Axis I: Maj. depressive disorder, generalized anxiety disorder    Axis II: Deferred    Gergory Biello, LCSW 07/07/2013

## 2013-07-07 NOTE — Telephone Encounter (Signed)
Patient called to set up tcs. She has a hx of kidney cancer. Please call her at [phone]

## 2013-07-07 NOTE — Patient Instructions (Signed)
Discussed orally 

## 2013-07-07 NOTE — Telephone Encounter (Signed)
I called pt and she said she had kidney cancer in her left kidney and they removed 60% of that kidney.  She is scheduled an OV with Neil Crouch, PA on 08/04/2013 at 8:30 AM due to her meds.   She request Dr. Oneida Alar to do her colonoscopy, since she did her husband's.

## 2013-07-08 NOTE — Telephone Encounter (Signed)
I assigned SF as her GI doctor

## 2013-07-08 NOTE — Telephone Encounter (Signed)
FYI to [name], pt request Dr. Oneida Alar.

## 2013-07-09 ENCOUNTER — Ambulatory Visit (HOSPITAL_COMMUNITY)
Admission: RE | Admit: 2013-07-09 | Discharge: 2013-07-09 | Disposition: A | Discharge status: Home / Self Care | Payer: BC Managed Care – PPO | Source: Ambulatory Visit | Attending: Orthopedic Surgery | Admitting: Orthopedic Surgery

## 2013-07-09 ENCOUNTER — Ambulatory Visit (HOSPITAL_COMMUNITY)
Admission: RE | Admit: 2013-07-09 | Discharge: 2013-07-09 | Disposition: A | Discharge status: Home / Self Care | Payer: BC Managed Care – PPO | Source: Ambulatory Visit | Attending: Family Medicine | Admitting: Family Medicine

## 2013-07-09 ENCOUNTER — Other Ambulatory Visit: Payer: Self-pay | Admitting: Orthopedic Surgery

## 2013-07-09 DIAGNOSIS — M79602 Pain in left arm: Secondary | ICD-10-CM

## 2013-07-09 DIAGNOSIS — M25512 Pain in left shoulder: Secondary | ICD-10-CM

## 2013-07-09 DIAGNOSIS — M5030 Other cervical disc degeneration, unspecified cervical region: Secondary | ICD-10-CM | POA: Insufficient documentation

## 2013-07-09 DIAGNOSIS — M898X9 Other specified disorders of bone, unspecified site: Secondary | ICD-10-CM | POA: Insufficient documentation

## 2013-07-09 DIAGNOSIS — Z1231 Encounter for screening mammogram for malignant neoplasm of breast: Secondary | ICD-10-CM | POA: Insufficient documentation

## 2013-07-09 DIAGNOSIS — M47812 Spondylosis without myelopathy or radiculopathy, cervical region: Secondary | ICD-10-CM | POA: Insufficient documentation

## 2013-07-09 DIAGNOSIS — Z139 Encounter for screening, unspecified: Secondary | ICD-10-CM

## 2013-07-09 DIAGNOSIS — M25519 Pain in unspecified shoulder: Secondary | ICD-10-CM | POA: Insufficient documentation

## 2013-07-15 ENCOUNTER — Encounter: Payer: Self-pay | Admitting: *Deleted

## 2013-07-16 ENCOUNTER — Encounter (HOSPITAL_COMMUNITY): Payer: Self-pay | Admitting: Psychiatry

## 2013-07-16 ENCOUNTER — Ambulatory Visit (INDEPENDENT_AMBULATORY_CARE_PROVIDER_SITE_OTHER): Payer: BC Managed Care – PPO | Admitting: Psychiatry

## 2013-07-16 VITALS — Ht 71.0 in | Wt 283.0 lb

## 2013-07-16 DIAGNOSIS — F3160 Bipolar disorder, current episode mixed, unspecified: Secondary | ICD-10-CM

## 2013-07-16 DIAGNOSIS — F332 Major depressive disorder, recurrent severe without psychotic features: Secondary | ICD-10-CM

## 2013-07-16 DIAGNOSIS — F411 Generalized anxiety disorder: Secondary | ICD-10-CM

## 2013-07-16 MED ORDER — CARBAMAZEPINE 200 MG PO TABS: ORAL_TABLET | ORAL | Status: DC

## 2013-07-16 MED ORDER — QUETIAPINE FUMARATE 50 MG PO TABS: 50.0000 mg | ORAL_TABLET | Freq: Every day | ORAL | Status: DC

## 2013-07-16 NOTE — Progress Notes (Signed)
Patient ID: April Ward, female   DOB: 11/08/1960, 53 y.o.   MRN: [id number] Patient ID: April Ward, female   DOB: 02-09-1961, 53 y.o.   MRN: [id number] Patient ID: April Ward, female   DOB: 1960/06/02, 53 y.o.   MRN: [id number]  Psychiatric Assessment Adult  Patient Identification:  April Ward Date of Evaluation:  07/16/2013 Chief Complaint: I've been  agitated lately History of Chief Complaint:   Chief Complaint  Patient presents with  . Anxiety  . Depression  . Manic Behavior  . Follow-up    Anxiety Symptoms include decreased concentration, nervous/anxious behavior and suicidal ideas.     this patient is a 53 year old married black female who lives with her husband, and 38 year old son and 21 year-old nephew  In [place]. 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  The patient was referred by Maurice Small therapist in our office. The patient states that she had a cyst removed in her left kidney in December. After she had the surgery initially she was told everything was okay. She later found that 60% of her kidney was removed and that she had renal cancer. She's very upset because of the conflicting information. She also developed severe anemia and probably will have to on iron infusion. She now doesn't trust the doctors because she doesn't know what to believe. She is scheduled to see an oncologist to make sure she doesn't need any more treatment for cancer. Her nephrologist so has released her and states she needs to come back in 6 months.  The patient travels 3 hours to her job and stays there in an apartment. She's been doing this since June. Now however she doesn't feel able to function. She's been very depressed since the surgery and the news about the cancer. She's been crying all the time. Her thoughts are racing and she is unable to sleep. She's very tired and doesn't have any appetite. She's had passive suicidal  ideation but no specific plan. She doesn't want to be around people because she gets so upset.  The patient had a depressive episode in May. At that time her sisters were upsetting her. Her mother died in 2011-08-02 and left her property to the patient and her 2 sisters. 2 sisters are not good about paying bills and the light bill is in her name. Her primary doctor had put her on  Vibryd which helped but her insurance did not cover it so she is no longer taking it. She's never been on any other antidepressants or had previous psychiatric treatment  The patient returns after 4 weeks. Last time we added Wellbutrin and cut back her Prozac. Now she is acting more erratically. She went out on a spending spree and bought a lot of things she didn't need. For a while she was afraid to leave her house but now she's been getting out and socializing. Her thoughts are racing but today at night when she can't sleep she gets up and walks around. She's angry and agitated particularly if anyone from her job calls. She's been flying into rages. Her son thinks she is bipolar and it certainly sounds as a she's developed some manic symptoms. She states she has suicidal thoughts but claims she wouldn't act on them. Her son is now in charge of all of her medications. She states she can keep herself safe and we did discuss possible hospitalization if she can't.  For now however we can add a mood stabilizer and possibly a low-dose antipsychotic to help her sleep at night and cut down on the mania Review of Systems  Constitutional: Positive for appetite change and fatigue.  Psychiatric/Behavioral: Positive for suicidal ideas, sleep disturbance, dysphoric mood and decreased concentration. The patient is nervous/anxious.    Physical Exam not done Depressive Symptoms: depressed mood, anhedonia, insomnia, psychomotor retardation, fatigue, feelings of worthlessness/guilt, hopelessness, suicidal thoughts without  plan, anxiety,  (Hypo) Manic Symptoms:   Elevated Mood:  No Irritable Mood:  No Grandiosity:  No Distractibility:  No Labiality of Mood:  No Delusions:  No Hallucinations:  No Impulsivity:  No Sexually Inappropriate Behavior:  No Financial Extravagance:  No Flight of Ideas:  No  Anxiety Symptoms: Excessive Worry:  Yes Panic Symptoms:  Yes Agoraphobia:  Yes Obsessive Compulsive: No  Symptoms: None, Specific Phobias:  No Social Anxiety:  Yes  Psychotic Symptoms:  Hallucinations: No None Delusions:  No Paranoia:  No   Ideas of Reference:  No  PTSD Symptoms: Ever had a traumatic exposure:  No Had a traumatic exposure in the last month:  No Re-experiencing: No None Hypervigilance:  No Hyperarousal: No None Avoidance: No None  Traumatic Brain Injury: No   Past Psychiatric History: Diagnosis: Maj. depression   Hospitalizations: None   Outpatient Care: None   Substance Abuse Care: None   Self-Mutilation: None   Suicidal Attempts: None   Violent Behaviors: None    Past Medical History:   Past Medical History  Diagnosis Date  . Hypertension   . Diabetes mellitus without complication   . Heart murmur   . Depression   . Anxiety   . GERD (gastroesophageal reflux disease)   . Arthritis   . Renal disease 10/2012  . Renal cancer    History of Loss of Consciousness:  No Seizure History:  No Cardiac History:  No Allergies:   Allergies  Allergen Reactions  . Skelaxin [Metaxalone] Hives   Current Medications:  Current Outpatient Prescriptions  Medication Sig Dispense Refill  . ALLOPURINOL PO Take by mouth.      . ALPRAZolam (XANAX) 0.5 MG tablet Take one twice a day and two at bedtime  120 tablet  2  . amLODipine-olmesartan (AZOR) 10-40 MG per tablet Take 1 tablet by mouth daily.      Marland Kitchen buPROPion (WELLBUTRIN XL) 150 MG 24 hr tablet Take 1 tablet (150 mg total) by mouth every morning.  30 tablet  2  . carbamazepine (TEGRETOL) 200 MG tablet Take one tablet at  bedtime for one week, then take two at bedtime  60 tablet  2  . carvedilol (COREG) 6.25 MG tablet Take 6.25 mg by mouth 2 (two) times daily with a meal.      . furosemide (LASIX) 20 MG tablet Take 20 mg by mouth daily.       . hydrALAZINE (APRESOLINE) 10 MG tablet Take 20 mg by mouth 3 (three) times daily.      Marland Kitchen HYDROcodone-acetaminophen (NORCO) 7.5-325 MG per tablet Take 1 tablet by mouth every 6 (six) hours as needed for pain.  42 tablet  2  . Iron-FA-B Cmp-C-Biot-Probiotic (FUSION PLUS) CAPS Take by mouth.      . linagliptin (TRADJENTA) 5 MG TABS tablet Take 5 mg by mouth daily.      Marland Kitchen lisinopril (PRINIVIL,ZESTRIL) 40 MG tablet Take 40 mg by mouth daily.      Marland Kitchen omeprazole (PRILOSEC) 20 MG capsule Take 20 mg by mouth  daily.      . prazosin (MINIPRESS) 2 MG capsule Take 1 capsule (2 mg total) by mouth at bedtime.  30 capsule  2  . QUEtiapine (SEROQUEL) 50 MG tablet Take 1 tablet (50 mg total) by mouth at bedtime.  30 tablet  2   No current facility-administered medications for this visit.    Previous Psychotropic Medications:  Medication Dose   Xanax   0.5 mg 3 times a day   Vibryd  40 mg every morning                   Substance Abuse History in the last 12 months: Substance Age of 1st Use Last Use Amount Specific Type  Nicotine      Alcohol      Cannabis      Opiates      Cocaine      Methamphetamines      LSD      Ecstasy      Benzodiazepines      Caffeine      Inhalants      Others:                          Medical Consequences of Substance Abuse:n/a  Legal Consequences of Substance Abuse: n/a  Family Consequences of Substance Abuse: n/a  Blackouts:  No DT's:  No Withdrawal Symptoms:  No None  Social History: Current Place of Residence: [place] of Birth: [place] Family Members: Husband 2 children, one nephew 4 grandchildren Marital Status:  Married Children:   Sons: 1  Daughters: 1 Relationships:  Education:   Dentist Problems/Performance:  Religious Beliefs/Practices: Christian History of Abuse: none Occupational Experiences; 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 Legal History: None Hobbies/Interests: Traveling, visiting with grandchildren  Family History:   Family History  Problem Relation Age of Onset  . Heart attack Mother   . Heart attack Father   . Depression Paternal Aunt   . Alcohol abuse Maternal Uncle     Mental Status Examination/Evaluation: Objective:  Appearance: Casual and Fairly Groomed  Engineer, water::  Fair  Speech:  Clear and Coherent  Volume:  Normal  Mood:  depressed anxious  Affect:  Depressed, irritable and anxious   Thought Process:  Intact  Orientation:  Full (Time, Place, and Person)  Thought Content: Rumination   Suicidal Thoughts:  Yes.  without intent/plan  Homicidal Thoughts:  No  Judgement:  Intact  Insight:  Good  Psychomotor Activity:  Normal  Akathisia:  No  Handed:  Right  AIMS (if indicated):    Assets:  Communication Skills Desire for Improvement    Laboratory/X-Ray Psychological Evaluation(s)       Assessment:  Axis I: Generalized Anxiety Disorder and Major Depression, Recurrent severe  AXIS I Generalized Anxiety Disorder and Major Depression, Recurrent severe  AXIS II Deferred  AXIS III Past Medical History  Diagnosis Date  . Hypertension   . Diabetes mellitus without complication   . Heart murmur   . Depression   . Anxiety   . GERD (gastroesophageal reflux disease)   . Arthritis   . Renal disease 10/2012  . Renal cancer      AXIS IV other psychosocial or environmental problems  AXIS V 51-60 moderate symptoms   Treatment Plan/Recommendations:  Plan of Care: Medication management   Laboratory:   Psychotherapy: The patient is seeing Maurice Small here   Medications:  Since the patient is now hypomanic we will discontinue the Prozac. She can stand the Wellbutrin as it is less  likely to precipitate mania. She will continue Xanax. She will start Tegretol 200 mg per day for one week and then increase to 400 mg per day. She will start Seroquel 50 mg each bedtime. I've warned her to watch her blood sugar.   Routine PRN Medications:  No  Consultations:   Safety Concerns:  She contracts for safety   Other:  She is not mentally stable enough to return to work. Her ability to work will be reevaluated in 2 weeks when she returns .if she becomes more unstable she can call me and we'll arrange hospitalization     April Spiller, MD 4/16/201510:27 AM

## 2013-07-17 ENCOUNTER — Telehealth (HOSPITAL_COMMUNITY): Payer: Self-pay | Admitting: Psychiatry

## 2013-07-17 NOTE — Telephone Encounter (Signed)
Asked to call Monday when [name] is here

## 2013-07-23 ENCOUNTER — Encounter: Payer: Self-pay | Admitting: Orthopedic Surgery

## 2013-07-23 ENCOUNTER — Ambulatory Visit (INDEPENDENT_AMBULATORY_CARE_PROVIDER_SITE_OTHER): Payer: BC Managed Care – PPO | Admitting: Orthopedic Surgery

## 2013-07-23 VITALS — BP 176/93 | Ht 71.5 in | Wt 282.0 lb

## 2013-07-23 DIAGNOSIS — M719 Bursopathy, unspecified: Secondary | ICD-10-CM

## 2013-07-23 DIAGNOSIS — M75100 Unspecified rotator cuff tear or rupture of unspecified shoulder, not specified as traumatic: Secondary | ICD-10-CM

## 2013-07-23 DIAGNOSIS — M67919 Unspecified disorder of synovium and tendon, unspecified shoulder: Secondary | ICD-10-CM

## 2013-07-23 MED ORDER — NABUMETONE 500 MG PO TABS
500.0000 mg | ORAL_TABLET | Freq: Two times a day (BID) | ORAL | Status: DC
Start: 2013-07-23 — End: 2013-09-23

## 2013-07-23 MED ORDER — HYDROCODONE-ACETAMINOPHEN 5-325 MG PO TABS: 1.0000 | ORAL_TABLET | Freq: Four times a day (QID) | ORAL | Status: DC | PRN

## 2013-07-23 NOTE — Progress Notes (Addendum)
  Dr. Berdine Addison referring physician [facility] Subjective:    Patient ID: April Ward, female    DOB: [birth year], 53 y.o.   MRN: [id number] Chief Complaint  Patient presents with  . Shoulder Pain    Left shoulder pain, due to injury 07/02/13    Shoulder Pain    today we have a 53 year old female who was doing lateral raises with 20 pounds of weights and felt pain in her left shoulder. She experiences giving out symptoms of left shoulder throbbing anterior shoulder pain which is worse at night and after activity. Pain level is 9. Previous x-rays include C-spine and shoulder which show spondylosis in the cervical spine and acromial spur on the shoulder x-ray. Previous treatment none no medications. She did try ice. She notices painful for elevation and painful activity related symptoms    Review of Systems  Constitutional: Negative.   HENT: Positive for sinus pressure.   Eyes: Positive for visual disturbance.  Gastrointestinal:       Heartburn  Musculoskeletal: Positive for arthralgias, joint swelling, myalgias, neck pain and neck stiffness.  All other systems reviewed and are negative.      Objective:   Physical Exam BP 176/93  Ht 5' 11.5" (1.816 m)  Wt 282 lb (127.914 kg)  BMI 38.79 kg/m2 General appearance is normal, the patient is alert and oriented x3 with normal mood and affect.  Right shoulder inspection no tenderness or swelling range of motion normal stability and strength normal skin normal   She has tenderness over the anterior shoulder joint line with decreased abduction but normal external rotation and normal internal rotation we do note is crepitance on range of motion and limited active forward elevation to 100. Passive range of motion 150. Shoulder stability is normal. Rotator cuff strength shows supraspinatus weakness otherwise normal scans intact pulses are good sensation is normal lymph nodes are negative cervical spine nontender  Lower extremity  exam  Ambulation is normal.  The right and left lower extremity:  Inspection and palpation revealed no tenderness or abnormality in alignment in the lower extremities. Range of motion is full.  Strength is grade 5.  All joints are stable.      Assessment & Plan:   Encounter Diagnosis  Name Primary?  . Rotator cuff syndrome Yes    Procedure inject subacromial space left shoulder Diagnosis rotator cuff syndrome left shoulder Medication Depo-Medrol 40 mg, 1 cc and lidocaine 1% 3 cc Verbal consent Timeout completed  The injection site was cleaned with alcohol and sprayed with ethyl chloride. From a posterior approach a 20-gauge needle was injected in the subacromial space. The medication went in easily. There were no complications. The wound was covered with a sterile bandage. Appropriate precautions were given.  Start physical therapy Return in 6 weeks Meds ordered this encounter  Medications  . nabumetone (RELAFEN) 500 MG tablet    Sig: Take 1 tablet (500 mg total) by mouth 2 (two) times daily.    Dispense:  60 tablet    Refill:  0  . HYDROcodone-acetaminophen (NORCO/VICODIN) 5-325 MG per tablet    Sig: Take 1 tablet by mouth every 6 (six) hours as needed for moderate pain.    Dispense:  56 tablet    Refill:  0

## 2013-07-23 NOTE — Progress Notes (Deleted)
  Subjective:    April Ward is a 53 y.o. female who presents with {right/left/bi:30031} shoulder pain. The symptoms began {onset:14048}. Aggravating factors: {shoulder pain inciting event:14072}. Pain is located {shoulder location:14418}. Discomfort is described as {pain quality:60202}. Symptoms are exacerbated by {exacerbation:14417}. Evaluation to date: {eval to date:14090}. Therapy to date includes: {shoulder tx to LPFX:90240}.  {Common ambulatory SmartLinks:19316}  Review of Systems {ros; complete:30496}   Objective:    Ht 5' 11.5" (1.816 m)  Wt 282 lb (127.914 kg)  BMI 38.79 kg/m2 Right shoulder: {shoulder exam:14421::"normal active ROM, no tenderness, no impingement sign"}  Left shoulder: {shoulder exam:14421::"normal active ROM, no tenderness, no impingement sign"}     Assessment:    {right/left/bi:19544} {shoulder dx:14051}    Plan:    {plan; shoulder pain:14422}

## 2013-07-23 NOTE — Patient Instructions (Addendum)
Call to arrange therapy   Joint Injection  Care After  Refer to this sheet in the next few days. These instructions provide you with information on caring for yourself after you have had a joint injection. Your caregiver also may give you more specific instructions. Your treatment has been planned according to current medical practices, but problems sometimes occur. Call your caregiver if you have any problems or questions after your procedure.  After any type of joint injection, it is not uncommon to experience:  Soreness, swelling, or bruising around the injection site.  Mild numbness, tingling, or weakness around the injection site caused by the numbing medicine used before or with the injection. It also is possible to experience the following effects associated with the specific agent after injection:  Iodine-based contrast agents:  Allergic reaction (itching, hives, widespread redness, and swelling beyond the injection site).  Corticosteroids (These effects are rare.):  Allergic reaction.  Increased blood sugar levels (If you have diabetes and you notice that your blood sugar levels have increased, notify your caregiver).  Increased blood pressure levels.  Mood swings.  Hyaluronic acid in the use of viscosupplementation.  Temporary heat or redness.  Temporary rash and itching.  Increased fluid accumulation in the injected joint. These effects all should resolve within a day after your procedure.  HOME CARE INSTRUCTIONS  Limit yourself to light activity the day of your procedure. Avoid lifting heavy objects, bending, stooping, or twisting.  Take prescription or over-the-counter pain medication as directed by your caregiver.  You may apply ice to your injection site to reduce pain and swelling the day of your procedure. Ice may be applied 3-4 times:  Put ice in a plastic bag.  Place a towel between your skin and the bag.  Leave the ice on for no longer than 15-20 minutes each  time. SEEK IMMEDIATE MEDICAL CARE IF:  Pain and swelling get worse rather than better or extend beyond the injection site.  Numbness does not go away.  Blood or fluid continues to leak from the injection site.  You have chest pain.  You have swelling of your face or tongue.  You have trouble breathing or you become dizzy.  You develop a fever, chills, or severe tenderness at the injection site that last longer than 1 day. MAKE SURE YOU:  Understand these instructions.  Watch your condition.  Get help right away if you are not doing well or if you get worse. Document Released: 11/30/2010 Document Revised: 06/11/2011 Document Reviewed: 11/30/2010  ExitCare Patient Information 2014 [employer]   

## 2013-07-30 ENCOUNTER — Encounter (HOSPITAL_COMMUNITY): Payer: Self-pay | Admitting: Psychiatry

## 2013-07-30 ENCOUNTER — Ambulatory Visit (INDEPENDENT_AMBULATORY_CARE_PROVIDER_SITE_OTHER): Payer: BC Managed Care – PPO | Admitting: Psychiatry

## 2013-07-30 VITALS — BP 140/98 | Ht 71.0 in | Wt 285.0 lb

## 2013-07-30 DIAGNOSIS — F3160 Bipolar disorder, current episode mixed, unspecified: Secondary | ICD-10-CM

## 2013-07-30 DIAGNOSIS — F411 Generalized anxiety disorder: Secondary | ICD-10-CM

## 2013-07-30 DIAGNOSIS — F332 Major depressive disorder, recurrent severe without psychotic features: Secondary | ICD-10-CM

## 2013-07-30 NOTE — Progress Notes (Signed)
Patient ID: April Ward, female   DOB: July 16, 1960, 53 y.o.   MRN: [id number] Patient ID: April Ward, female   DOB: July 16, 1960, 53 y.o.   MRN: [id number] Patient ID: April Ward, female   DOB: July 16, 1960, 53 y.o.   MRN: [id number] Patient ID: April Ward, female   DOB: July 16, 1960, 53 y.o.   MRN: [id number]  Psychiatric Assessment Adult  Patient Identification:  April Boringonia B Decoursey Date of Evaluation:  07/30/2013 Chief Complaint: I've been  agitated lately History of Chief Complaint:   Chief Complaint  Patient presents with  . Anxiety  . Depression  . Manic Behavior  . Follow-up    Anxiety Symptoms include decreased concentration, nervous/anxious behavior and suicidal ideas.     this patient is a 53 year old married black female who lives with her husband, and 10346 year old son and 53 year-old nephew  In [place]. 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  The patient was referred by Florencia ReasonsPeggy Bynum therapist in our office. The patient states that she had a cyst removed in her left kidney in December. After she had the surgery initially she was told everything was okay. She later found that 60% of her kidney was removed and that she had renal cancer. She's very upset because of the conflicting information. She also developed severe anemia and probably will have to on iron infusion. She now doesn't trust the doctors because she doesn't know what to believe. She is scheduled to see an oncologist to make sure she doesn't need any more treatment for cancer. Her nephrologist so has released her and states she needs to come back in 6 months.  The patient travels 3 hours to her job and stays there in an apartment. She's been doing this since June. Now however she doesn't feel able to function. She's been very depressed since the surgery and the news about the cancer. She's been crying all the time. Her thoughts are racing and she is unable to  sleep. She's very tired and doesn't have any appetite. She's had passive suicidal ideation but no specific plan. She doesn't want to be around people because she gets so upset.  The patient had a depressive episode in May. At that time her sisters were upsetting her. Her mother died in 2013 and left her property to the patient and her 2 sisters. 2 sisters are not good about paying bills and the light bill is in her name. Her primary doctor had put her on  Vibryd which helped but her insurance did not cover it so she is no longer taking it. She's never been on any other antidepressants or had previous psychiatric treatment  The patient returns after 2 weeks. I had her come back quickly because last time she seems somewhat manic. She went to spend money on credit cards and was out of the community talking to a lot of people which was very much unlike her. She is now on Seroquel and Tegretol and she seems to be calming down. The Tegretol is causing more strange dreams and she is screened out in her sleep. I told her perhaps we could stop this fairly soon as it is also increased her appetite which is not good. Her mood however is more stable and she's not acting erratically anymore. She still very hesitant about returning to work because of stress was so difficult for her but she thinks it would "kill me" she  denies any suicidal ideation Review of Systems  Constitutional: Positive for appetite change and fatigue.  Psychiatric/Behavioral: Positive for suicidal ideas, sleep disturbance, dysphoric mood and decreased concentration. The patient is nervous/anxious.    Physical Exam not done Depressive Symptoms: depressed mood, anhedonia, insomnia, psychomotor retardation, fatigue, feelings of worthlessness/guilt, hopelessness, suicidal thoughts without plan, anxiety,  (Hypo) Manic Symptoms:   Elevated Mood:  No Irritable Mood:  No Grandiosity:  No Distractibility:  No Labiality of Mood:   No Delusions:  No Hallucinations:  No Impulsivity:  No Sexually Inappropriate Behavior:  No Financial Extravagance:  No Flight of Ideas:  No  Anxiety Symptoms: Excessive Worry:  Yes Panic Symptoms:  Yes Agoraphobia:  Yes Obsessive Compulsive: No  Symptoms: None, Specific Phobias:  No Social Anxiety:  Yes  Psychotic Symptoms:  Hallucinations: No None Delusions:  No Paranoia:  No   Ideas of Reference:  No  PTSD Symptoms: Ever had a traumatic exposure:  No Had a traumatic exposure in the last month:  No Re-experiencing: No None Hypervigilance:  No Hyperarousal: No None Avoidance: No None  Traumatic Brain Injury: No   Past Psychiatric History: Diagnosis: Maj. depression   Hospitalizations: None   Outpatient Care: None   Substance Abuse Care: None   Self-Mutilation: None   Suicidal Attempts: None   Violent Behaviors: None    Past Medical History:   Past Medical History  Diagnosis Date  . Hypertension   . Diabetes mellitus without complication   . Heart murmur   . Depression   . Anxiety   . GERD (gastroesophageal reflux disease)   . Arthritis   . Renal disease 10/2012  . Renal cancer    History of Loss of Consciousness:  No Seizure History:  No Cardiac History:  No Allergies:   Allergies  Allergen Reactions  . Skelaxin [Metaxalone] Hives   Current Medications:  Current Outpatient Prescriptions  Medication Sig Dispense Refill  . ALLOPURINOL PO Take by mouth.      . ALPRAZolam (XANAX) 0.5 MG tablet Take one twice a day and two at bedtime  120 tablet  2  . amLODipine-olmesartan (AZOR) 10-40 MG per tablet Take 1 tablet by mouth daily.      Marland Kitchen buPROPion (WELLBUTRIN XL) 150 MG 24 hr tablet Take 1 tablet (150 mg total) by mouth every morning.  30 tablet  2  . carbamazepine (TEGRETOL) 200 MG tablet Take one tablet at bedtime for one week, then take two at bedtime  60 tablet  2  . carvedilol (COREG) 6.25 MG tablet Take 6.25 mg by mouth 2 (two) times daily with a  meal.      . furosemide (LASIX) 20 MG tablet Take 20 mg by mouth daily.       . hydrALAZINE (APRESOLINE) 10 MG tablet Take 20 mg by mouth 3 (three) times daily.      Marland Kitchen HYDROcodone-acetaminophen (NORCO/VICODIN) 5-325 MG per tablet Take 1 tablet by mouth every 6 (six) hours as needed for moderate pain.  56 tablet  0  . Iron-FA-B Cmp-C-Biot-Probiotic (FUSION PLUS) CAPS Take by mouth.      . linagliptin (TRADJENTA) 5 MG TABS tablet Take 5 mg by mouth daily.      Marland Kitchen lisinopril (PRINIVIL,ZESTRIL) 40 MG tablet Take 40 mg by mouth daily.      . nabumetone (RELAFEN) 500 MG tablet Take 1 tablet (500 mg total) by mouth 2 (two) times daily.  60 tablet  0  . omeprazole (PRILOSEC) 20 MG capsule Take 20  mg by mouth daily.      . prazosin (MINIPRESS) 2 MG capsule Take 1 capsule (2 mg total) by mouth at bedtime.  30 capsule  2  . QUEtiapine (SEROQUEL) 50 MG tablet Take 1 tablet (50 mg total) by mouth at bedtime.  30 tablet  2   No current facility-administered medications for this visit.    Previous Psychotropic Medications:  Medication Dose   Xanax   0.5 mg 3 times a day   Vibryd  40 mg every morning                   Substance Abuse History in the last 12 months: Substance Age of 1st Use Last Use Amount Specific Type  Nicotine      Alcohol      Cannabis      Opiates      Cocaine      Methamphetamines      LSD      Ecstasy      Benzodiazepines      Caffeine      Inhalants      Others:                          Medical Consequences of Substance Abuse:n/a  Legal Consequences of Substance Abuse: n/a  Family Consequences of Substance Abuse: n/a  Blackouts:  No DT's:  No Withdrawal Symptoms:  No None  Social History: Current Place of Residence: [place] of Birth: [place] Family Members: Husband 2 children, one nephew 4 grandchildren Marital Status:  Married Children:   Sons: 1  Daughters: 1 Relationships:  Education:  Dentist  Problems/Performance:  Religious Beliefs/Practices: Christian History of Abuse: none Occupational Experiences; 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 Legal History: None Hobbies/Interests: Traveling, visiting with grandchildren  Family History:   Family History  Problem Relation Age of Onset  . Heart attack Mother   . Heart attack Father   . Depression Paternal Aunt   . Alcohol abuse Maternal Uncle     Mental Status Examination/Evaluation: Objective:  Appearance: Casual and Fairly Groomed  Engineer, water::  Fair  Speech:  Clear and Coherent  Volume:  Normal  Mood:  Fairly good today   Affect:  Brighter   Thought Process:  Intact  Orientation:  Full (Time, Place, and Person)  Thought Content: Rumination   Suicidal Thoughts:  no  Homicidal Thoughts:  No  Judgement:  Intact  Insight:  Good  Psychomotor Activity:  Normal  Akathisia:  No  Handed:  Right  AIMS (if indicated):    Assets:  Communication Skills Desire for Improvement    Laboratory/X-Ray Psychological Evaluation(s)       Assessment:  Axis I: Generalized Anxiety Disorder and Major Depression, Recurrent severe  AXIS I Generalized Anxiety Disorder and Major Depression, Recurrent severe  AXIS II Deferred  AXIS III Past Medical History  Diagnosis Date  . Hypertension   . Diabetes mellitus without complication   . Heart murmur   . Depression   . Anxiety   . GERD (gastroesophageal reflux disease)   . Arthritis   . Renal disease 10/2012  . Renal cancer      AXIS IV other psychosocial or environmental problems  AXIS V 51-60 moderate symptoms   Treatment Plan/Recommendations:  Plan of Care: Medication management   Laboratory:   Psychotherapy: The patient is seeing Maurice Small here   Medications: The  patient will continue her current medications including Wellbutrin, Xanax Tegretol and Seroquel.   Routine PRN Medications:  No  Consultations:   Safety Concerns:  She  contracts for safety   Other:  She is not mentally stable enough to return to work. Her ability to work will be reevaluated in 4weeks when she returns      [name], MD 4/30/201510:52 AM

## 2013-08-04 ENCOUNTER — Ambulatory Visit: Payer: Self-pay | Admitting: Gastroenterology

## 2013-08-06 ENCOUNTER — Telehealth (HOSPITAL_COMMUNITY): Payer: Self-pay | Admitting: *Deleted

## 2013-08-06 ENCOUNTER — Encounter (HOSPITAL_COMMUNITY): Payer: Self-pay | Admitting: Psychiatry

## 2013-08-06 NOTE — Telephone Encounter (Signed)
Discussed return to work and she feels like she is ready to try

## 2013-08-06 NOTE — Telephone Encounter (Signed)
Cannot answer as this is not a medical question

## 2013-08-07 ENCOUNTER — Encounter (HOSPITAL_COMMUNITY): Payer: Self-pay | Admitting: Psychiatry

## 2013-08-07 ENCOUNTER — Ambulatory Visit (INDEPENDENT_AMBULATORY_CARE_PROVIDER_SITE_OTHER): Payer: BC Managed Care – PPO | Admitting: Psychiatry

## 2013-08-07 VITALS — Ht 71.0 in | Wt 290.0 lb

## 2013-08-07 DIAGNOSIS — F332 Major depressive disorder, recurrent severe without psychotic features: Secondary | ICD-10-CM

## 2013-08-07 DIAGNOSIS — F3160 Bipolar disorder, current episode mixed, unspecified: Secondary | ICD-10-CM

## 2013-08-07 DIAGNOSIS — F411 Generalized anxiety disorder: Secondary | ICD-10-CM

## 2013-08-07 NOTE — Progress Notes (Signed)
Patient ID: April Ward, female   DOB: [birth year], 53 y.o.   MRN: [id number] Patient ID: April Ward, female   DOB: [birth year], 53 y.o.   MRN: [id number] Patient ID: April Ward, female   DOB: 01/15/1961, 53 y.o.   MRN: [id number] Patient ID: April Ward, female   DOB: 23-Mar-1961, 53 y.o.   MRN: [id number] Patient ID: April Ward, female   DOB: [birth year], 53 y.o.   MRN: [id number]  Psychiatric Assessment Adult  Patient Identification:  April Ward Date of Evaluation:  08/07/2013 Chief Complaint: I've been  agitated lately History of Chief Complaint:   Chief Complaint  Patient presents with  . Anxiety  . Depression  . Follow-up    Anxiety Symptoms include decreased concentration, nervous/anxious behavior and suicidal ideas.     this patient is a 53 year old married black female who lives with her husband, and 66 year old son and 51 year-old nephew  In [place]. 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  The patient was referred by Maurice Small therapist in our office. The patient states that she had a cyst removed in her left kidney in December. After she had the surgery initially she was told everything was okay. She later found that 60% of her kidney was removed and that she had renal cancer. She's very upset because of the conflicting information. She also developed severe anemia and probably will have to on iron infusion. She now doesn't trust the doctors because she doesn't know what to believe. She is scheduled to see an oncologist to make sure she doesn't need any more treatment for cancer. Her nephrologist so has released her and states she needs to come back in 6 months.  The patient travels 3 hours to her job and stays there in an apartment. She's been doing this since June. Now however she doesn't feel able to function. She's been very depressed since the surgery and the news about the cancer. She's been crying all  the time. Her thoughts are racing and she is unable to sleep. She's very tired and doesn't have any appetite. She's had passive suicidal ideation but no specific plan. She doesn't want to be around people because she gets so upset.  The patient had a depressive episode in May. At that time her sisters were upsetting her. Her mother died in July 25, 2011 and left her property to the patient and her 2 sisters. 2 sisters are not good about paying bills and the light bill is in her name. Her primary doctor had put her on  Vibryd which helped but her insurance did not cover it so she is no longer taking it. She's never been on any other antidepressants or had previous psychiatric treatment  The patient returns after one week. Yesterday she called stating she wanted a letter from me claiming she could go back to work. I wrote the letter but told her to cut consider this carefully because she has been so stressed lately. She came in today to discuss this and decided that the idea of going back to this particular job is way too stressful. She stated that she would probably get there and gets so angry she might hurt someone else. She is doing better at home but is still looking at a reevaluation of her renal cancer this week and it simply is just not a good time. I told her we would reevaluate in 30 days.  She's not suicidal or manic anymore but is anxious and worried about having to return to work Review of Systems  Constitutional: Positive for appetite change and fatigue.  Psychiatric/Behavioral: Positive for suicidal ideas, sleep disturbance, dysphoric mood and decreased concentration. The patient is nervous/anxious.    Physical Exam not done Depressive Symptoms: depressed mood, anhedonia, insomnia, psychomotor retardation, fatigue, feelings of worthlessness/guilt, hopelessness, suicidal thoughts without plan, anxiety,  (Hypo) Manic Symptoms:   Elevated Mood:  No Irritable Mood:  No Grandiosity:   No Distractibility:  No Labiality of Mood:  No Delusions:  No Hallucinations:  No Impulsivity:  No Sexually Inappropriate Behavior:  No Financial Extravagance:  No Flight of Ideas:  No  Anxiety Symptoms: Excessive Worry:  Yes Panic Symptoms:  Yes Agoraphobia:  Yes Obsessive Compulsive: No  Symptoms: None, Specific Phobias:  No Social Anxiety:  Yes  Psychotic Symptoms:  Hallucinations: No None Delusions:  No Paranoia:  No   Ideas of Reference:  No  PTSD Symptoms: Ever had a traumatic exposure:  No Had a traumatic exposure in the last month:  No Re-experiencing: No None Hypervigilance:  No Hyperarousal: No None Avoidance: No None  Traumatic Brain Injury: No   Past Psychiatric History: Diagnosis: Maj. depression   Hospitalizations: None   Outpatient Care: None   Substance Abuse Care: None   Self-Mutilation: None   Suicidal Attempts: None   Violent Behaviors: None    Past Medical History:   Past Medical History  Diagnosis Date  . Hypertension   . Diabetes mellitus without complication   . Heart murmur   . Depression   . Anxiety   . GERD (gastroesophageal reflux disease)   . Arthritis   . Renal disease 10/2012  . Renal cancer    History of Loss of Consciousness:  No Seizure History:  No Cardiac History:  No Allergies:   Allergies  Allergen Reactions  . Skelaxin [Metaxalone] Hives   Current Medications:  Current Outpatient Prescriptions  Medication Sig Dispense Refill  . ALLOPURINOL PO Take by mouth.      . ALPRAZolam (XANAX) 0.5 MG tablet Take one twice a day and two at bedtime  120 tablet  2  . amLODipine-olmesartan (AZOR) 10-40 MG per tablet Take 1 tablet by mouth daily.      Marland Kitchen buPROPion (WELLBUTRIN XL) 150 MG 24 hr tablet Take 1 tablet (150 mg total) by mouth every morning.  30 tablet  2  . carbamazepine (TEGRETOL) 200 MG tablet Take one tablet at bedtime for one week, then take two at bedtime  60 tablet  2  . carvedilol (COREG) 6.25 MG tablet  Take 6.25 mg by mouth 2 (two) times daily with a meal.      . furosemide (LASIX) 20 MG tablet Take 20 mg by mouth daily.       . hydrALAZINE (APRESOLINE) 10 MG tablet Take 20 mg by mouth 3 (three) times daily.      Marland Kitchen HYDROcodone-acetaminophen (NORCO/VICODIN) 5-325 MG per tablet Take 1 tablet by mouth every 6 (six) hours as needed for moderate pain.  56 tablet  0  . Iron-FA-B Cmp-C-Biot-Probiotic (FUSION PLUS) CAPS Take by mouth.      . linagliptin (TRADJENTA) 5 MG TABS tablet Take 5 mg by mouth daily.      Marland Kitchen lisinopril (PRINIVIL,ZESTRIL) 40 MG tablet Take 40 mg by mouth daily.      . nabumetone (RELAFEN) 500 MG tablet Take 1 tablet (500 mg total) by mouth 2 (two) times daily.  60 tablet  0  . omeprazole (PRILOSEC) 20 MG capsule Take 20 mg by mouth daily.      . prazosin (MINIPRESS) 2 MG capsule Take 1 capsule (2 mg total) by mouth at bedtime.  30 capsule  2  . QUEtiapine (SEROQUEL) 50 MG tablet Take 1 tablet (50 mg total) by mouth at bedtime.  30 tablet  2   No current facility-administered medications for this visit.    Previous Psychotropic Medications:  Medication Dose   Xanax   0.5 mg 3 times a day   Vibryd  40 mg every morning                   Substance Abuse History in the last 12 months: Substance Age of 1st Use Last Use Amount Specific Type  Nicotine      Alcohol      Cannabis      Opiates      Cocaine      Methamphetamines      LSD      Ecstasy      Benzodiazepines      Caffeine      Inhalants      Others:                          Medical Consequences of Substance Abuse:n/a  Legal Consequences of Substance Abuse: n/a  Family Consequences of Substance Abuse: n/a  Blackouts:  No DT's:  No Withdrawal Symptoms:  No None  Social History: Current Place of Residence: [place] of Birth: [place] Family Members: Husband 2 children, one nephew 4 grandchildren Marital Status:  Married Children:   Sons: 1  Daughters:  1 Relationships:  Education:  Dentist Problems/Performance:  Religious Beliefs/Practices: Christian History of Abuse: none Occupational Experiences; 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 Legal History: None Hobbies/Interests: Traveling, visiting with grandchildren  Family History:   Family History  Problem Relation Age of Onset  . Heart attack Mother   . Heart attack Father   . Depression Paternal Aunt   . Alcohol abuse Maternal Uncle     Mental Status Examination/Evaluation: Objective:  Appearance: Casual and Fairly Groomed  Engineer, water::  Fair  Speech:  Clear and Coherent  Volume:  Normal  Mood:  Anxious   Affect:  Congruent   Thought Process:  Intact  Orientation:  Full (Time, Place, and Person)  Thought Content: Rumination   Suicidal Thoughts:  no  Homicidal Thoughts:  No  Judgement:  Intact  Insight:  Good  Psychomotor Activity:  Normal  Akathisia:  No  Handed:  Right  AIMS (if indicated):    Assets:  Communication Skills Desire for Improvement    Laboratory/X-Ray Psychological Evaluation(s)       Assessment:  Axis I: Generalized Anxiety Disorder and Major Depression, Recurrent severe  AXIS I Generalized Anxiety Disorder and Major Depression, Recurrent severe  AXIS II Deferred  AXIS III Past Medical History  Diagnosis Date  . Hypertension   . Diabetes mellitus without complication   . Heart murmur   . Depression   . Anxiety   . GERD (gastroesophageal reflux disease)   . Arthritis   . Renal disease 10/2012  . Renal cancer      AXIS IV other psychosocial or environmental problems  AXIS V 51-60 moderate symptoms   Treatment Plan/Recommendations:  Plan of Care: Medication management   Laboratory:   Psychotherapy:  The patient is seeing Maurice Small here   Medications: The patient will continue her current medications including Wellbutrin, Xanax Tegretol and Seroquel.   Routine PRN Medications:  No   Consultations:   Safety Concerns:  She contracts for safety   Other:  She is not mentally stable enough to return to her particular job and all of its stressors Her ability to work will be reevaluated in 4weeks when she returns      [name], MD 5/8/20151:19 PM

## 2013-08-13 ENCOUNTER — Ambulatory Visit (HOSPITAL_COMMUNITY): Payer: Self-pay | Admitting: Psychiatry

## 2013-08-19 ENCOUNTER — Telehealth: Payer: Self-pay | Admitting: Gastroenterology

## 2013-08-19 ENCOUNTER — Ambulatory Visit: Payer: Self-pay | Admitting: Gastroenterology

## 2013-08-19 ENCOUNTER — Encounter: Payer: Self-pay | Admitting: Gastroenterology

## 2013-08-19 NOTE — Telephone Encounter (Signed)
Mailed letter °

## 2013-08-19 NOTE — Telephone Encounter (Signed)
Pt was a no show

## 2013-08-28 ENCOUNTER — Ambulatory Visit (INDEPENDENT_AMBULATORY_CARE_PROVIDER_SITE_OTHER): Payer: BC Managed Care – PPO | Admitting: Psychiatry

## 2013-08-28 ENCOUNTER — Encounter (HOSPITAL_COMMUNITY): Payer: Self-pay | Admitting: Psychiatry

## 2013-08-28 VITALS — BP 140/80 | Ht 71.0 in | Wt 295.0 lb

## 2013-08-28 DIAGNOSIS — F332 Major depressive disorder, recurrent severe without psychotic features: Secondary | ICD-10-CM

## 2013-08-28 DIAGNOSIS — F411 Generalized anxiety disorder: Secondary | ICD-10-CM

## 2013-08-28 DIAGNOSIS — F3160 Bipolar disorder, current episode mixed, unspecified: Secondary | ICD-10-CM

## 2013-08-28 MED ORDER — ALPRAZOLAM 0.5 MG PO TABS: ORAL_TABLET | ORAL | Status: DC

## 2013-08-28 MED ORDER — PRAZOSIN HCL 2 MG PO CAPS: 2.0000 mg | ORAL_CAPSULE | Freq: Every day | ORAL | Status: DC

## 2013-08-28 MED ORDER — BUPROPION HCL ER (XL) 150 MG PO TB24
150.0000 mg | ORAL_TABLET | ORAL | Status: DC
Start: 2013-08-28 — End: 2013-09-23

## 2013-08-28 MED ORDER — CARBAMAZEPINE 200 MG PO TABS: ORAL_TABLET | ORAL | Status: DC

## 2013-08-28 MED ORDER — QUETIAPINE FUMARATE 50 MG PO TABS: 50.0000 mg | ORAL_TABLET | Freq: Every day | ORAL | Status: DC

## 2013-08-28 NOTE — Progress Notes (Signed)
Patient ID: April Ward, female   DOB: 1960/05/13, 53 y.o.   MRN: [id number] Patient ID: April Ward, female   DOB: 05-Dec-1960, 53 y.o.   MRN: [id number] Patient ID: April Ward, female   DOB: May 07, 1960, 53 y.o.   MRN: [id number] Patient ID: April Ward, female   DOB: [birth year], 53 y.o.   MRN: [id number] Patient ID: April Ward, female   DOB: [birth year], 53 y.o.   MRN: [id number] Patient ID: April Ward, female   DOB: Aug 26, 1960, 53 y.o.   MRN: [id number]  Psychiatric Assessment Adult  Patient Identification:  April Ward Date of Evaluation:  08/28/2013 Chief Complaint: "I went back to work this week History of Chief Complaint:   Chief Complaint  Patient presents with  . Anxiety  . Depression  . Follow-up    Anxiety Symptoms include decreased concentration, nervous/anxious behavior and suicidal ideas.     this patient is a 53 year old married black female who lives with her husband, and 97 year old son and 58 year-old nephew  In [place]. 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  The patient was referred by Maurice Small therapist in our office. The patient states that she had a cyst removed in her left kidney in December. After she had the surgery initially she was told everything was okay. She later found that 60% of her kidney was removed and that she had renal cancer. She's very upset because of the conflicting information. She also developed severe anemia and probably will have to on iron infusion. She now doesn't trust the doctors because she doesn't know what to believe. She is scheduled to see an oncologist to make sure she doesn't need any more treatment for cancer. Her nephrologist so has released her and states she needs to come back in 6 months.  The patient travels 3 hours to her job and stays there in an apartment. She's been doing this since June. Now however she doesn't feel able to function. She's been very  depressed since the surgery and the news about the cancer. She's been crying all the time. Her thoughts are racing and she is unable to sleep. She's very tired and doesn't have any appetite. She's had passive suicidal ideation but no specific plan. She doesn't want to be around people because she gets so upset.  The patient had a depressive episode in May. At that time her sisters were upsetting her. Her mother died in 07/15/2011 and left her property to the patient and her 2 sisters. 2 sisters are not good about paying bills and the light bill is in her name. Her primary doctor had put her on  Vibryd which helped but her insurance did not cover it so she is no longer taking it. She's never been on any other antidepressants or had previous psychiatric treatment  The patient returns after 3 weeks. She went back to work this week because she felt like if she didn't she would lose all her benefits. Her son went with her to [place] to stay with her so she wouldn't feel so alone. This seemed to help. She's staying "very low key" at work and just doing what she needs to do and not taking anything extra. So far her mood is been staying stable and she's sleeping well at night. She seen her primary doctor today because she had an episode of hand tingling and stumbling over the weekend. It  sounds concerning for possible TIA. She's not had any more episodes but her right hand is still tingling. She has been cleared by her renal physician to go back to work. She's had no further manic symptoms or racing thoughts Review of Systems  Constitutional: Positive for appetite change and fatigue.  Psychiatric/Behavioral: Positive for suicidal ideas, sleep disturbance, dysphoric mood and decreased concentration. The patient is nervous/anxious.    Physical Exam not done Depressive Symptoms: depressed mood, anhedonia, insomnia, psychomotor retardation, fatigue, feelings of worthlessness/guilt, hopelessness, suicidal thoughts  without plan, anxiety,  (Hypo) Manic Symptoms:   Elevated Mood:  No Irritable Mood:  No Grandiosity:  No Distractibility:  No Labiality of Mood:  No Delusions:  No Hallucinations:  No Impulsivity:  No Sexually Inappropriate Behavior:  No Financial Extravagance:  No Flight of Ideas:  No  Anxiety Symptoms: Excessive Worry:  Yes Panic Symptoms:  Yes Agoraphobia:  Yes Obsessive Compulsive: No  Symptoms: None, Specific Phobias:  No Social Anxiety:  Yes  Psychotic Symptoms:  Hallucinations: No None Delusions:  No Paranoia:  No   Ideas of Reference:  No  PTSD Symptoms: Ever had a traumatic exposure:  No Had a traumatic exposure in the last month:  No Re-experiencing: No None Hypervigilance:  No Hyperarousal: No None Avoidance: No None  Traumatic Brain Injury: No   Past Psychiatric History: Diagnosis: Maj. depression   Hospitalizations: None   Outpatient Care: None   Substance Abuse Care: None   Self-Mutilation: None   Suicidal Attempts: None   Violent Behaviors: None    Past Medical History:   Past Medical History  Diagnosis Date  . Hypertension   . Diabetes mellitus without complication   . Heart murmur   . Depression   . Anxiety   . GERD (gastroesophageal reflux disease)   . Arthritis   . Renal disease 10/2012  . Renal cancer    History of Loss of Consciousness:  No Seizure History:  No Cardiac History:  No Allergies:   Allergies  Allergen Reactions  . Skelaxin [Metaxalone] Hives   Current Medications:  Current Outpatient Prescriptions  Medication Sig Dispense Refill  . ALLOPURINOL PO Take by mouth.      . ALPRAZolam (XANAX) 0.5 MG tablet Take one twice a day and two at bedtime  120 tablet  2  . amLODipine-olmesartan (AZOR) 10-40 MG per tablet Take 1 tablet by mouth daily.      Marland Kitchen buPROPion (WELLBUTRIN XL) 150 MG 24 hr tablet Take 1 tablet (150 mg total) by mouth every morning.  30 tablet  2  . carbamazepine (TEGRETOL) 200 MG tablet Take one  tablet at bedtime for one week, then take two at bedtime  60 tablet  2  . carvedilol (COREG) 6.25 MG tablet Take 6.25 mg by mouth 2 (two) times daily with a meal.      . furosemide (LASIX) 20 MG tablet Take 20 mg by mouth daily.       . hydrALAZINE (APRESOLINE) 10 MG tablet Take 20 mg by mouth 3 (three) times daily.      Marland Kitchen HYDROcodone-acetaminophen (NORCO/VICODIN) 5-325 MG per tablet Take 1 tablet by mouth every 6 (six) hours as needed for moderate pain.  56 tablet  0  . Iron-FA-B Cmp-C-Biot-Probiotic (FUSION PLUS) CAPS Take by mouth.      . linagliptin (TRADJENTA) 5 MG TABS tablet Take 5 mg by mouth daily.      Marland Kitchen lisinopril (PRINIVIL,ZESTRIL) 40 MG tablet Take 40 mg by mouth daily.      Marland Kitchen  nabumetone (RELAFEN) 500 MG tablet Take 1 tablet (500 mg total) by mouth 2 (two) times daily.  60 tablet  0  . omeprazole (PRILOSEC) 20 MG capsule Take 20 mg by mouth daily.      . prazosin (MINIPRESS) 2 MG capsule Take 1 capsule (2 mg total) by mouth at bedtime.  30 capsule  2  . QUEtiapine (SEROQUEL) 50 MG tablet Take 1 tablet (50 mg total) by mouth at bedtime.  30 tablet  2   No current facility-administered medications for this visit.    Previous Psychotropic Medications:  Medication Dose   Xanax   0.5 mg 3 times a day   Vibryd  40 mg every morning                   Substance Abuse History in the last 12 months: Substance Age of 1st Use Last Use Amount Specific Type  Nicotine      Alcohol      Cannabis      Opiates      Cocaine      Methamphetamines      LSD      Ecstasy      Benzodiazepines      Caffeine      Inhalants      Others:                          Medical Consequences of Substance Abuse:n/a  Legal Consequences of Substance Abuse: n/a  Family Consequences of Substance Abuse: n/a  Blackouts:  No DT's:  No Withdrawal Symptoms:  No None  Social History: Current Place of Residence: [place] of Birth: [place] Family Members: Husband 2  children, one nephew 4 grandchildren Marital Status:  Married Children:   Sons: 1  Daughters: 1 Relationships:  Education:  Dentist Problems/Performance:  Religious Beliefs/Practices: Christian History of Abuse: none Occupational Experiences; 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 Legal History: None Hobbies/Interests: Traveling, visiting with grandchildren  Family History:   Family History  Problem Relation Age of Onset  . Heart attack Mother   . Heart attack Father   . Depression Paternal Aunt   . Alcohol abuse Maternal Uncle     Mental Status Examination/Evaluation: Objective:  Appearance: Casual and Fairly Groomed  Engineer, water::  Fair  Speech:  Clear and Coherent  Volume:  Normal  Mood:  Calm, fairly good today   Affect:  Congruent   Thought Process:  Intact  Orientation:  Full (Time, Place, and Person)  Thought Content: Rumination   Suicidal Thoughts:  no  Homicidal Thoughts:  No  Judgement:  Intact  Insight:  Good  Psychomotor Activity:  Normal  Akathisia:  No  Handed:  Right  AIMS (if indicated):    Assets:  Communication Skills Desire for Improvement    Laboratory/X-Ray Psychological Evaluation(s)       Assessment:  Axis I: Generalized Anxiety Disorder and Major Depression, Recurrent severe  AXIS I Generalized Anxiety Disorder and Major Depression, Recurrent severe  AXIS II Deferred  AXIS III Past Medical History  Diagnosis Date  . Hypertension   . Diabetes mellitus without complication   . Heart murmur   . Depression   . Anxiety   . GERD (gastroesophageal reflux disease)   . Arthritis   . Renal disease 10/2012  . Renal cancer      AXIS IV other psychosocial or environmental problems  AXIS V 51-60 moderate symptoms   Treatment Plan/Recommendations:  Plan of Care: Medication management   Laboratory:   Psychotherapy: The patient is seeing Maurice Small here   Medications: The patient will  continue her current medications including Wellbutrin, Xanax Tegretol and Seroquel.   Routine PRN Medications:  No  Consultations:   Safety Concerns:  She contracts for safety   Other:  She will return in [place], Neoma Laming, MD 5/29/201510:56 AM

## 2013-09-04 ENCOUNTER — Ambulatory Visit (HOSPITAL_COMMUNITY): Payer: Self-pay | Admitting: Psychiatry

## 2013-09-07 ENCOUNTER — Encounter: Payer: Self-pay | Admitting: Orthopedic Surgery

## 2013-09-08 ENCOUNTER — Ambulatory Visit: Payer: BC Managed Care – PPO | Admitting: Orthopedic Surgery

## 2013-09-17 ENCOUNTER — Ambulatory Visit: Payer: Self-pay | Admitting: Cardiology

## 2013-09-23 ENCOUNTER — Encounter: Payer: Self-pay | Admitting: *Deleted

## 2013-09-23 ENCOUNTER — Encounter: Payer: Self-pay | Admitting: Cardiology

## 2013-09-23 ENCOUNTER — Encounter (HOSPITAL_COMMUNITY): Payer: Self-pay | Admitting: Psychiatry

## 2013-09-23 ENCOUNTER — Ambulatory Visit (INDEPENDENT_AMBULATORY_CARE_PROVIDER_SITE_OTHER): Payer: BC Managed Care – PPO | Admitting: Psychiatry

## 2013-09-23 ENCOUNTER — Ambulatory Visit (INDEPENDENT_AMBULATORY_CARE_PROVIDER_SITE_OTHER): Payer: BC Managed Care – PPO | Admitting: Cardiology

## 2013-09-23 VITALS — Ht 71.0 in | Wt 301.0 lb

## 2013-09-23 VITALS — BP 170/90 | HR 71 | Ht 71.0 in | Wt 301.0 lb

## 2013-09-23 DIAGNOSIS — E119 Type 2 diabetes mellitus without complications: Secondary | ICD-10-CM | POA: Insufficient documentation

## 2013-09-23 DIAGNOSIS — R079 Chest pain, unspecified: Secondary | ICD-10-CM

## 2013-09-23 DIAGNOSIS — F419 Anxiety disorder, unspecified: Secondary | ICD-10-CM

## 2013-09-23 DIAGNOSIS — I10 Essential (primary) hypertension: Secondary | ICD-10-CM

## 2013-09-23 DIAGNOSIS — F3160 Bipolar disorder, current episode mixed, unspecified: Secondary | ICD-10-CM

## 2013-09-23 DIAGNOSIS — F411 Generalized anxiety disorder: Secondary | ICD-10-CM

## 2013-09-23 DIAGNOSIS — I519 Heart disease, unspecified: Secondary | ICD-10-CM

## 2013-09-23 DIAGNOSIS — N183 Chronic kidney disease, stage 3 unspecified: Secondary | ICD-10-CM | POA: Insufficient documentation

## 2013-09-23 DIAGNOSIS — F332 Major depressive disorder, recurrent severe without psychotic features: Secondary | ICD-10-CM

## 2013-09-23 DIAGNOSIS — I5189 Other ill-defined heart diseases: Secondary | ICD-10-CM

## 2013-09-23 DIAGNOSIS — M109 Gout, unspecified: Secondary | ICD-10-CM | POA: Insufficient documentation

## 2013-09-23 MED ORDER — CARBAMAZEPINE 200 MG PO TABS: ORAL_TABLET | ORAL | Status: DC

## 2013-09-23 MED ORDER — ALPRAZOLAM 0.5 MG PO TABS: 0.5000 mg | ORAL_TABLET | Freq: Two times a day (BID) | ORAL | Status: DC

## 2013-09-23 MED ORDER — QUETIAPINE FUMARATE 50 MG PO TABS: 50.0000 mg | ORAL_TABLET | Freq: Every day | ORAL | Status: DC

## 2013-09-23 MED ORDER — CARVEDILOL 12.5 MG PO TABS: 12.5000 mg | ORAL_TABLET | Freq: Two times a day (BID) | ORAL | Status: DC

## 2013-09-23 MED ORDER — BUPROPION HCL ER (XL) 150 MG PO TB24: 150.0000 mg | ORAL_TABLET | ORAL | Status: DC

## 2013-09-23 MED ORDER — HYDRALAZINE HCL 100 MG PO TABS: 100.0000 mg | ORAL_TABLET | Freq: Three times a day (TID) | ORAL | Status: DC

## 2013-09-23 MED ORDER — FUROSEMIDE 40 MG PO TABS: 40.0000 mg | ORAL_TABLET | Freq: Two times a day (BID) | ORAL | Status: DC

## 2013-09-23 NOTE — Progress Notes (Signed)
Patient ID: April Ward, female   DOB: [birth year], 53 y.o.   MRN: [id number]  Psychiatric Assessment Adult  Patient Identification:  April Ward Date of Evaluation:  09/23/2013 Chief Complaint: I've been very stressed at work History of Chief Complaint:   Chief Complaint  Patient presents with  . Anxiety  . Depression  . Manic Behavior  . Memory Loss    Anxiety Symptoms include decreased concentration, nervous/anxious behavior and suicidal ideas.     this patient is a 53 year old married black female who lives with her husband, and 46 year old son and 65 year-old nephew  In [place]. 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  The patient was referred by Maurice Small therapist in our office. The patient states that she had a cyst removed in her left kidney in December. After she had the surgery initially she was told everything was okay. She later found that 60% of her kidney was removed and that she had renal cancer. She's very upset because of the conflicting information. She also developed severe anemia and probably will have to on iron infusion. She now doesn't trust the doctors because she doesn't know what to believe. She is scheduled to see an oncologist to make sure she doesn't need any more treatment for cancer. Her nephrologist so has released her and states she needs to come back in 6 months.  The patient travels 3 hours to her job and stays there in an apartment. She's been doing this since June. Now however she doesn't feel able to function. She's been very depressed since the surgery and the news about the cancer. She's been crying all the time. Her thoughts are racing and she is unable to sleep. She's very tired and doesn't have any appetite. She's had passive suicidal ideation but no specific plan. She doesn't want to be around people because she gets so upset.  The patient had a depressive episode in May. At  that time her sisters were upsetting her. Her mother died in 07/28/11 and left her property to the patient and her 2 sisters. 2 sisters are not good about paying bills and the light bill is in her name. Her primary doctor had put her on  Vibryd which helped but her insurance did not cover it so she is no longer taking it. She's never been on any other antidepressants or had previous psychiatric treatment  The patient returns after one month. She is back at work. In early June she had a bad episode and her blood pressure went up to 220/90 in the emergency room in [place]. She's been seeing a cardiologist and has to have an ultrasound. She feels very stressed at work and at times has to leave because she feels so bad and anxious. She's trying to decide if his stress at work is worth all the health problems she has developed. She doesn't really want to change her medications right now but I'm concerned that since she's been on Seroquel she has gained 15 pounds and is now up to 301 pounds. We discussed the possibility of her going out on disability and at that point we could try doing away with the Seroquel. Right now however is helping her sleep and calming her Review of Systems  Constitutional: Positive for appetite change and fatigue.  Psychiatric/Behavioral: Positive for suicidal ideas, sleep disturbance, dysphoric mood and decreased concentration. The patient is nervous/anxious.    Physical Exam  not done Depressive Symptoms: depressed mood, anhedonia, insomnia, psychomotor retardation, fatigue, feelings of worthlessness/guilt, hopelessness, suicidal thoughts without plan, anxiety,  (Hypo) Manic Symptoms:   Elevated Mood:  No Irritable Mood:  No Grandiosity:  No Distractibility:  No Labiality of Mood:  No Delusions:  No Hallucinations:  No Impulsivity:  No Sexually Inappropriate Behavior:  No Financial Extravagance:  No Flight of Ideas:  No  Anxiety Symptoms: Excessive Worry:  Yes Panic  Symptoms:  Yes Agoraphobia:  Yes Obsessive Compulsive: No  Symptoms: None, Specific Phobias:  No Social Anxiety:  Yes  Psychotic Symptoms:  Hallucinations: No None Delusions:  No Paranoia:  No   Ideas of Reference:  No  PTSD Symptoms: Ever had a traumatic exposure:  No Had a traumatic exposure in the last month:  No Re-experiencing: No None Hypervigilance:  No Hyperarousal: No None Avoidance: No None  Traumatic Brain Injury: No   Past Psychiatric History: Diagnosis: Maj. depression   Hospitalizations: None   Outpatient Care: None   Substance Abuse Care: None   Self-Mutilation: None   Suicidal Attempts: None   Violent Behaviors: None    Past Medical History:   Past Medical History  Diagnosis Date  . Hypertension   . Diabetes mellitus without complication   . Heart murmur   . Depression   . Anxiety   . GERD (gastroesophageal reflux disease)   . Arthritis   . Renal disease 10/2012  . Renal cancer    History of Loss of Consciousness:  No Seizure History:  No Cardiac History:  No Allergies:   Allergies  Allergen Reactions  . Skelaxin [Metaxalone] Hives   Current Medications:  Current Outpatient Prescriptions  Medication Sig Dispense Refill  . ALPRAZolam (XANAX) 0.5 MG tablet Take 1 tablet (0.5 mg total) by mouth 2 (two) times daily. Take one twice a day and two at bedtime  90 tablet  2  . buPROPion (WELLBUTRIN XL) 150 MG 24 hr tablet Take 1 tablet (150 mg total) by mouth every morning.  30 tablet  2  . carbamazepine (TEGRETOL) 200 MG tablet Take one tablet at bedtime for one week, then take two at bedtime  60 tablet  2  . carvedilol (COREG) 12.5 MG tablet Take 1 tablet (12.5 mg total) by mouth 2 (two) times daily with a meal.  60 tablet  6  . furosemide (LASIX) 40 MG tablet Take 1 tablet (40 mg total) by mouth 2 (two) times daily.      . hydrALAZINE (APRESOLINE) 100 MG tablet Take 1 tablet (100 mg total) by mouth 3 (three) times daily.      Marland Kitchen  HYDROcodone-acetaminophen (NORCO/VICODIN) 5-325 MG per tablet Take 1 tablet by mouth every 6 (six) hours as needed for moderate pain.  56 tablet  0  . linagliptin (TRADJENTA) 5 MG TABS tablet Take 5 mg by mouth daily.      Marland Kitchen losartan (COZAAR) 50 MG tablet Take 50 mg by mouth daily.      Marland Kitchen omeprazole (PRILOSEC) 20 MG capsule Take 20 mg by mouth daily.      . prazosin (MINIPRESS) 2 MG capsule Take 1 capsule (2 mg total) by mouth at bedtime.  30 capsule  2  . QUEtiapine (SEROQUEL) 50 MG tablet Take 1 tablet (50 mg total) by mouth at bedtime.  30 tablet  2   No current facility-administered medications for this visit.    Previous Psychotropic Medications:  Medication Dose   Xanax   0.5 mg 3 times a  day   Vibryd  40 mg every morning                   Substance Abuse History in the last 12 months: Substance Age of 1st Use Last Use Amount Specific Type  Nicotine      Alcohol      Cannabis      Opiates      Cocaine      Methamphetamines      LSD      Ecstasy      Benzodiazepines      Caffeine      Inhalants      Others:                          Medical Consequences of Substance Abuse:n/a  Legal Consequences of Substance Abuse: n/a  Family Consequences of Substance Abuse: n/a  Blackouts:  No DT's:  No Withdrawal Symptoms:  No None  Social History: Current Place of Residence: [place] of Birth: [place] Family Members: Husband 2 children, one nephew 4 grandchildren Marital Status:  Married Children:   Sons: 1  Daughters: 1 Relationships:  Education:  Dentist Problems/Performance:  Religious Beliefs/Practices: Christian History of Abuse: none Occupational Experiences; 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 Legal History: None Hobbies/Interests: Traveling, visiting with grandchildren  Family History:   Family History  Problem Relation Age of Onset  . Heart attack Mother   . Heart  attack Father   . Depression Paternal Aunt   . Alcohol abuse Maternal Uncle     Mental Status Examination/Evaluation: Objective:  Appearance: Casual and Fairly Groomed  Engineer, water::  Fair  Speech:  Clear and Coherent  Volume:  Normal  Mood:  Slightly anxious   Affect:  Depressed  Thought Process:  Intact  Orientation:  Full (Time, Place, and Person)  Thought Content:  WDL  Suicidal Thoughts:  Yes.  without intent/plan  Homicidal Thoughts:  No  Judgement:  Intact  Insight:  Good  Psychomotor Activity:  Normal  Akathisia:  No  Handed:  Right  AIMS (if indicated):    Assets:  Communication Skills Desire for Improvement    Laboratory/X-Ray Psychological Evaluation(s)       Assessment:  Axis I: Generalized Anxiety Disorder and Major Depression, Recurrent severe  AXIS I Generalized Anxiety Disorder and Major Depression, Recurrent severe  AXIS II Deferred  AXIS III Past Medical History  Diagnosis Date  . Hypertension   . Diabetes mellitus without complication   . Heart murmur   . Depression   . Anxiety   . GERD (gastroesophageal reflux disease)   . Arthritis   . Renal disease 10/2012  . Renal cancer      AXIS IV other psychosocial or environmental problems  AXIS V 51-60 moderate symptoms   Treatment Plan/Recommendations:  Plan of Care: Medication management   Laboratory:   Psychotherapy: The patient is seeing Maurice Small here   Medications: She'll continue her current medications   Routine PRN Medications:  No  Consultations:   Safety Concerns:  She contracts for safety   Other:    return in 6 weeks     [name], MD 6/24/20154:56 PM

## 2013-09-23 NOTE — Patient Instructions (Signed)
   Remain off the Norvasc  Increase Coreg to 12.5mg  twice a day (new sent to pharm) - may take 2 tabs of your 6.25mg  tablet twice a day till finish current supply Continue all other medications.   Your physician has requested that you have en exercise stress myoview. For further information please visit [email or web address]. Please follow instruction sheet, as given. Office will contact with results via phone or letter.   DASH diet info sheet provided today Your physician has requested that you regularly monitor and record your blood pressure readings at home. Please take approximately 1-2 hours after medication & bring back to next office visit for MD review. Follow up in  1 month

## 2013-09-23 NOTE — Progress Notes (Signed)
Clinical Summary Ms. Edds is a 53 y.o.female seen today as a new patient for chest pain.  1. Chest pain - started approx 1 month ago, 3 total episodes - pressing like feeling in midchest, 8/10. Can have numbness in left arm. +SOB. +diaphoretic. +dizzy. Nothing worst. Tried NG with help in symptoms. Pains lasts few minutes.  - admission [facility] 09/01/13 with chest pain and dizziness, bp 220/90. No evidence of ACS - echo LVEF 55-60%, grade II diastolic dysfunction.  - extensive workup by neuro including carotid US and MRI for her dizziness without clear pathology +DOE that has progressed over the last few months  CAD risk factors: Father MI lates 58s, mother MI in late 50s, HTN     Past Medical History  Diagnosis Date  . Hypertension   . Diabetes mellitus without complication   . Heart murmur   . Depression   . Anxiety   . GERD (gastroesophageal reflux disease)   . Arthritis   . Renal disease 10/2012  . Renal cancer      Allergies  Allergen Reactions  . Skelaxin [Metaxalone] Hives     Current Outpatient Prescriptions  Medication Sig Dispense Refill  . ALLOPURINOL PO Take by mouth.      . ALPRAZolam (XANAX) 0.5 MG tablet Take one twice a day and two at bedtime  120 tablet  2  . amLODipine-olmesartan (AZOR) 10-40 MG per tablet Take 1 tablet by mouth daily.      Marland Kitchen buPROPion (WELLBUTRIN XL) 150 MG 24 hr tablet Take 1 tablet (150 mg total) by mouth every morning.  30 tablet  2  . carbamazepine (TEGRETOL) 200 MG tablet Take one tablet at bedtime for one week, then take two at bedtime  60 tablet  2  . carvedilol (COREG) 6.25 MG tablet Take 6.25 mg by mouth 2 (two) times daily with a meal.      . furosemide (LASIX) 20 MG tablet Take 20 mg by mouth daily.       . hydrALAZINE (APRESOLINE) 10 MG tablet Take 20 mg by mouth 3 (three) times daily.      Marland Kitchen HYDROcodone-acetaminophen (NORCO/VICODIN) 5-325 MG per tablet Take 1 tablet by mouth every 6 (six) hours as needed for  moderate pain.  56 tablet  0  . Iron-FA-B Cmp-C-Biot-Probiotic (FUSION PLUS) CAPS Take by mouth.      . linagliptin (TRADJENTA) 5 MG TABS tablet Take 5 mg by mouth daily.      Marland Kitchen lisinopril (PRINIVIL,ZESTRIL) 40 MG tablet Take 40 mg by mouth daily.      . nabumetone (RELAFEN) 500 MG tablet Take 1 tablet (500 mg total) by mouth 2 (two) times daily.  60 tablet  0  . omeprazole (PRILOSEC) 20 MG capsule Take 20 mg by mouth daily.      . prazosin (MINIPRESS) 2 MG capsule Take 1 capsule (2 mg total) by mouth at bedtime.  30 capsule  2  . QUEtiapine (SEROQUEL) 50 MG tablet Take 1 tablet (50 mg total) by mouth at bedtime.  30 tablet  2   No current facility-administered medications for this visit.     Past Surgical History  Procedure Laterality Date  . Cesarean section  [id number]  . Knee arthroscopy with medial menisectomy Right 08/22/2012    Procedure: KNEE ARTHROSCOPY WITH PARTIAL MEDIAL MENISECTOMY;  Surgeon: Carole Civil, MD;  Location: [facility];  Service: Orthopedics;  Laterality: Right;  . Chondroplasty Right 08/22/2012    Procedure: CHONDROPLASTY;  Surgeon: Carole Civil, MD;  Location: [facility];  Service: Orthopedics;  Laterality: Right;  . Partial nephrectomy       Allergies  Allergen Reactions  . Skelaxin [Metaxalone] Hives      Family History  Problem Relation Age of Onset  . Heart attack Mother   . Heart attack Father   . Depression Paternal Aunt   . Alcohol abuse Maternal Uncle      Social History Ms. Killian reports that she has never smoked. She has never used smokeless tobacco. Ms. Bouska reports that she does not drink alcohol.   Review of Systems CONSTITUTIONAL: No weight loss, fever, chills, weakness or fatigue.  HEENT: Eyes: No visual loss, blurred vision, double vision or yellow sclerae.No hearing loss, sneezing, congestion, runny nose or sore throat.  SKIN: No rash or itching.  CARDIOVASCULAR: per HPI RESPIRATORY: No shortness of breath, cough or sputum.   GASTROINTESTINAL: No anorexia, nausea, vomiting or diarrhea. No abdominal pain or blood.  GENITOURINARY: No burning on urination, no polyuria NEUROLOGICAL: dizziness  MUSCULOSKELETAL: No muscle, back pain, joint pain or stiffness.  LYMPHATICS: No enlarged nodes. No history of splenectomy.  PSYCHIATRIC: No history of depression or anxiety.  ENDOCRINOLOGIC: No reports of sweating, cold or heat intolerance. No polyuria or polydipsia.  Marland Kitchen   Physical Examination p 71 bp 170/90 Wt 301 lbs BMI 42 Gen: resting comfortably, no acute distress HEENT: no scleral icterus, pupils equal round and reactive, no palptable cervical adenopathy,  CV: RRR, no m/r/g, no JVD Resp: Clear to auscultation bilaterally GI: abdomen is soft, non-tender, non-distended, normal bowel sounds, no hepatosplenomegaly MSK: extremities are warm, no edema.  Skin: warm, no rash Neuro:  no focal deficits Psych: appropriate affect   Diagnostic Studies 08/2013 Carotid US No significant stenosis  08/28/13 EKG NSR, Q waves in inferior leads and anteroseptal leads with poor R-wave progress  Assessment and Plan  1. Chest pain - unclear etiology, multiple CAD risk factors with significantly abnormal EKG at baseline. - will obtain exercise MPI for further evaluation, 2 day protocol due to her body habitus - high threshold for invasive testing due to her CKD and risk of renal failure  2. HTN - recent admission with severely elevated bp in [place] with systolics >563. BP elevated in clinic - evidence of end organ damage with diastolic dysfunction and kidney disease - recently started on norvasc, reports increased LE edema since starting. Will stop norvasc, increase coreg to 12.5mg  bid for better bp control and also potential additional antianginal effects if true cardiac chest pain - pending bp response we may consider for testing for primary HTN - given info on DASH diet - she is to keep a bp log for next visit  3.  Diastolic dysfunction - echo from [place], [place] shows grade II diastolic dysfunction - patient appears euvolemic, continue current diuretic dose, work to optimize bp control.   F/u 1 month   Arnoldo Lenis, M.D., F.A.C.C.

## 2013-09-25 ENCOUNTER — Ambulatory Visit (HOSPITAL_COMMUNITY): Payer: Self-pay | Admitting: Psychiatry

## 2013-10-05 ENCOUNTER — Encounter (HOSPITAL_COMMUNITY): Admission: RE | Admit: 2013-10-05 | Payer: BC Managed Care – PPO | Source: Ambulatory Visit

## 2013-10-05 ENCOUNTER — Encounter (HOSPITAL_COMMUNITY): Payer: BC Managed Care – PPO

## 2013-10-05 ENCOUNTER — Encounter (HOSPITAL_COMMUNITY)
Admission: RE | Admit: 2013-10-05 | Discharge: 2013-10-05 | Disposition: A | Discharge status: Home / Self Care | Payer: BC Managed Care – PPO | Source: Ambulatory Visit | Attending: Cardiology | Admitting: Cardiology

## 2013-10-05 ENCOUNTER — Ambulatory Visit (HOSPITAL_COMMUNITY): Admission: RE | Admit: 2013-10-05 | Payer: BC Managed Care – PPO | Source: Ambulatory Visit

## 2013-10-05 DIAGNOSIS — R079 Chest pain, unspecified: Secondary | ICD-10-CM

## 2013-10-06 ENCOUNTER — Ambulatory Visit (HOSPITAL_COMMUNITY)
Admission: RE | Admit: 2013-10-06 | Discharge: 2013-10-06 | Disposition: A | Discharge status: Home / Self Care | Payer: BC Managed Care – PPO | Source: Ambulatory Visit | Attending: Cardiology | Admitting: Cardiology

## 2013-10-06 ENCOUNTER — Encounter (HOSPITAL_COMMUNITY): Payer: BC Managed Care – PPO

## 2013-10-06 ENCOUNTER — Encounter (HOSPITAL_COMMUNITY)
Admission: RE | Admit: 2013-10-06 | Discharge: 2013-10-06 | Disposition: A | Discharge status: Home / Self Care | Payer: BC Managed Care – PPO | Source: Ambulatory Visit | Attending: Cardiology | Admitting: Cardiology

## 2013-10-06 ENCOUNTER — Encounter (HOSPITAL_COMMUNITY): Payer: Self-pay

## 2013-10-06 ENCOUNTER — Encounter: Payer: Self-pay | Admitting: Orthopedic Surgery

## 2013-10-06 ENCOUNTER — Ambulatory Visit: Payer: BC Managed Care – PPO | Admitting: Orthopedic Surgery

## 2013-10-06 DIAGNOSIS — R079 Chest pain, unspecified: Secondary | ICD-10-CM | POA: Insufficient documentation

## 2013-10-06 DIAGNOSIS — I5030 Unspecified diastolic (congestive) heart failure: Secondary | ICD-10-CM | POA: Insufficient documentation

## 2013-10-06 DIAGNOSIS — I4949 Other premature depolarization: Secondary | ICD-10-CM | POA: Insufficient documentation

## 2013-10-06 DIAGNOSIS — E119 Type 2 diabetes mellitus without complications: Secondary | ICD-10-CM | POA: Insufficient documentation

## 2013-10-06 DIAGNOSIS — I10 Essential (primary) hypertension: Secondary | ICD-10-CM | POA: Insufficient documentation

## 2013-10-06 DIAGNOSIS — R42 Dizziness and giddiness: Secondary | ICD-10-CM | POA: Insufficient documentation

## 2013-10-06 HISTORY — DX: Disorder of kidney and ureter, unspecified: N28.9

## 2013-10-06 MED ORDER — SODIUM CHLORIDE 0.9 % IJ SOLN
INTRAMUSCULAR | Status: AC
  Administered 2013-10-06: 10 mL via INTRAVENOUS
  Filled 2013-10-06: qty 10

## 2013-10-06 MED ORDER — TECHNETIUM TC 99M SESTAMIBI - CARDIOLITE
10.0000 | Freq: Once | INTRAVENOUS | Status: AC | PRN
  Administered 2013-10-06: 10.5 via INTRAVENOUS

## 2013-10-06 MED ORDER — TECHNETIUM TC 99M SESTAMIBI GENERIC - CARDIOLITE
30.0000 | Freq: Once | INTRAVENOUS | Status: AC | PRN
  Administered 2013-10-06: 32 via INTRAVENOUS

## 2013-10-06 MED ORDER — REGADENOSON 0.4 MG/5ML IV SOLN
INTRAVENOUS | Status: AC
  Administered 2013-10-06: 0.4 mg via INTRAVENOUS
  Filled 2013-10-06: qty 5

## 2013-10-06 MED ORDER — REGADENOSON 0.4 MG/5ML IV SOLN
0.4000 mg | Freq: Once | INTRAVENOUS | Status: AC | PRN
  Administered 2013-10-06: 0.4 mg via INTRAVENOUS

## 2013-10-06 MED ORDER — SODIUM CHLORIDE 0.9 % IJ SOLN
10.0000 mL | INTRAMUSCULAR | Status: DC | PRN
  Administered 2013-10-06: 10 mL via INTRAVENOUS

## 2013-10-06 NOTE — Progress Notes (Signed)
Stress Lab Nurses Notes - April Ward  April Ward 10/06/2013 Reason for doing test: Chest Pain and dizziness Type of test: Test Changed unable to reach THR, Lexiscan Cardiolite given Nurse performing test: Gerrit Halls, RN Nuclear Medicine Tech: Melburn Hake Echo Tech: Not Applicable MD performing test: Koneswaran/K.Purcell Nails NP Family MD: [facility] explained and consent signed: Yes.   IV started: 22g jelco, Saline lock flushed, No redness or edema and Saline lock started in radiology Symptoms:  SOB while on treadmill & chest pain with Lexiscan Treatment/Intervention: None Reason test stopped: protocol completed After recovery IV was: Discontinued via X-ray tech and No redness or edema Patient to return to Nuc. Med at : 9:45 Patient discharged: Home Patient's Condition upon discharge was: stable Comments: During test BP 230/81 & HR 133.  Recovery BP 157/66 & HR 88. Symptoms resolved in recovery.   [name] T

## 2013-10-08 ENCOUNTER — Other Ambulatory Visit: Payer: Self-pay | Admitting: Cardiology

## 2013-10-08 ENCOUNTER — Telehealth: Payer: Self-pay | Admitting: Cardiology

## 2013-10-08 ENCOUNTER — Encounter: Payer: Self-pay | Admitting: Cardiology

## 2013-10-08 ENCOUNTER — Ambulatory Visit (INDEPENDENT_AMBULATORY_CARE_PROVIDER_SITE_OTHER): Payer: BC Managed Care – PPO | Admitting: Cardiology

## 2013-10-08 VITALS — BP 173/85 | HR 71 | Ht 71.0 in | Wt 302.0 lb

## 2013-10-08 DIAGNOSIS — I5032 Chronic diastolic (congestive) heart failure: Secondary | ICD-10-CM

## 2013-10-08 DIAGNOSIS — R0789 Other chest pain: Secondary | ICD-10-CM

## 2013-10-08 DIAGNOSIS — I10 Essential (primary) hypertension: Secondary | ICD-10-CM

## 2013-10-08 DIAGNOSIS — N183 Chronic kidney disease, stage 3 unspecified: Secondary | ICD-10-CM

## 2013-10-08 DIAGNOSIS — R400 Somnolence: Secondary | ICD-10-CM

## 2013-10-08 DIAGNOSIS — R404 Transient alteration of awareness: Secondary | ICD-10-CM

## 2013-10-08 DIAGNOSIS — R0989 Other specified symptoms and signs involving the circulatory and respiratory systems: Secondary | ICD-10-CM

## 2013-10-08 MED ORDER — CARVEDILOL 25 MG PO TABS: 25.0000 mg | ORAL_TABLET | Freq: Two times a day (BID) | ORAL | Status: DC

## 2013-10-08 NOTE — Patient Instructions (Signed)
Your physician has requested that you have a carotid duplex. This test is an ultrasound of the carotid arteries in your neck. It looks at blood flow through these arteries that supply the brain with blood. Allow one hour for this exam. There are no restrictions or special instructions. Referral to Dr. Redmond Pulling for sleep study  Increase Coreg to 25mg  twice a day  - new sent to pharm Continue all other medications.   Lab for renin + aldosterone ratio Office will contact with results via phone or letter.   Your physician has requested that you regularly monitor and record your blood pressure readings at home. Please check approximately 2 hours after medication x 2 week and return to office for MD review Follow up in  1 month

## 2013-10-08 NOTE — Progress Notes (Signed)
Clinical Summary Ms. Pinder is a 53 y.o.female seen today for follow up of the following medical problems.   1. Chest pain  - started approx 1 month ago, 3 total episodes  - pressing like feeling in midchest, 8/10. Can have numbness in left arm. +SOB. +diaphoretic. +dizzy. Nothing worst. Tried NG with help in symptoms. Pains lasts few minutes.  - admission [facility] 09/01/13 with chest pain and dizziness, bp 220/90. No evidence of ACS  - echo LVEF 55-60%, grade II diastolic dysfunction.  - extensive workup by neuro including carotid US and MRI for her dizziness without clear pathology  +DOE that has progressed over the last few months    08/2013 MPI low risk. Probable soft tissue attenuation, LVEF 61%.   2. HTN - reports diagnosed with HTN age 20.  - checks at home 3 times a day. Typically around 160s/80s - compliant with meds - limiting sodium - CT scan in 2006 with normal renal arteries. TSH 1.80. No EtoH. Never been tested for sleep apnea.   3. OSA? +Snoring. + daytime somnolence. Unsure about apneic episodes.   4. CKD - she is followed at [facility] by nephrology. As part of workup for AKI US showed 11 cm cyst. 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    Past Medical History  Diagnosis Date  . Hypertension   . Diabetes mellitus without complication   . Heart murmur   . Depression   . Anxiety   . GERD (gastroesophageal reflux disease)   . Arthritis   . Renal disease 10/2012  . Renal cancer   . Renal insufficiency      Allergies  Allergen Reactions  . Skelaxin [Metaxalone] Hives     Current Outpatient Prescriptions  Medication Sig Dispense Refill  . ALPRAZolam (XANAX) 0.5 MG tablet Take 1 tablet (0.5 mg total) by mouth 2 (two) times daily. Take one twice a day and two at bedtime  90 tablet  2  . buPROPion (WELLBUTRIN XL) 150 MG 24 hr tablet Take 1 tablet (150 mg total) by mouth every  morning.  30 tablet  2  . carbamazepine (TEGRETOL) 200 MG tablet Take one tablet at bedtime for one week, then take two at bedtime  60 tablet  2  . carvedilol (COREG) 12.5 MG tablet Take 1 tablet (12.5 mg total) by mouth 2 (two) times daily with a meal.  60 tablet  6  . furosemide (LASIX) 40 MG tablet Take 1 tablet (40 mg total) by mouth 2 (two) times daily.      . hydrALAZINE (APRESOLINE) 100 MG tablet Take 1 tablet (100 mg total) by mouth 3 (three) times daily.      Marland Kitchen HYDROcodone-acetaminophen (NORCO/VICODIN) 5-325 MG per tablet Take 1 tablet by mouth every 6 (six) hours as needed for moderate pain.  56 tablet  0  . linagliptin (TRADJENTA) 5 MG TABS tablet Take 5 mg by mouth daily.      Marland Kitchen losartan (COZAAR) 50 MG tablet Take 50 mg by mouth daily.      Marland Kitchen omeprazole (PRILOSEC) 20 MG capsule Take 20 mg by mouth daily.      . prazosin (MINIPRESS) 2 MG capsule Take 1 capsule (2 mg total) by mouth at bedtime.  30 capsule  2  . QUEtiapine (SEROQUEL) 50 MG tablet Take 1 tablet (50 mg total) by mouth at bedtime.  30 tablet  2  No current facility-administered medications for this visit.     Past Surgical History  Procedure Laterality Date  . Cesarean section  [id number]  . Knee arthroscopy with medial menisectomy Right 08/22/2012    Procedure: KNEE ARTHROSCOPY WITH PARTIAL MEDIAL MENISECTOMY;  Surgeon: Carole Civil, MD;  Location: [facility];  Service: Orthopedics;  Laterality: Right;  . Chondroplasty Right 08/22/2012    Procedure: CHONDROPLASTY;  Surgeon: Carole Civil, MD;  Location: [facility];  Service: Orthopedics;  Laterality: Right;  . Partial nephrectomy       Allergies  Allergen Reactions  . Skelaxin [Metaxalone] Hives      Family History  Problem Relation Age of Onset  . Heart attack Mother   . Heart attack Father   . Depression Paternal Aunt   . Alcohol abuse Maternal Uncle      Social History Ms. Cange reports that she has never smoked. She has never used smokeless  tobacco. Ms. Tippets reports that she does not drink alcohol.   Review of Systems CONSTITUTIONAL: No weight loss, fever, chills, weakness or fatigue.  HEENT: Eyes: No visual loss, blurred vision, double vision or yellow sclerae.No hearing loss, sneezing, congestion, runny nose or sore throat.  SKIN: No rash or itching.  CARDIOVASCULAR: per HPI RESPIRATORY: No shortness of breath, cough or sputum.  GASTROINTESTINAL: No anorexia, nausea, vomiting or diarrhea. No abdominal pain or blood.  GENITOURINARY: No burning on urination, no polyuria NEUROLOGICAL: No headache, dizziness, syncope, paralysis, ataxia, numbness or tingling in the extremities. No change in bowel or bladder control.  MUSCULOSKELETAL: No muscle, back pain, joint pain or stiffness.  LYMPHATICS: No enlarged nodes. No history of splenectomy.  PSYCHIATRIC: No history of depression or anxiety.  ENDOCRINOLOGIC: No reports of sweating, cold or heat intolerance. No polyuria or polydipsia.  Marland Kitchen   Physical Examination p 71 bp 173/85 Wt 302 lbs BMI 42 Gen: resting comfortably, no acute distress HEENT: no scleral icterus, pupils equal round and reactive, no palptable cervical adenopathy,  CV:: RRR, 2/6 systolic murmur RUSB, no JVD, + bilateral carotid bruits Resp: Clear to auscultation bilaterally GI: abdomen is soft, non-tender, non-distended, normal bowel sounds, no hepatosplenomegaly MSK: extremities are warm, no edema.  Skin: warm, no rash Neuro:  no focal deficits Psych: appropriate affect  09/2013 Lexiscan Overall low risk exercise/Lexiscan Cardiolite. Patient had limited  exercise capacity achieving maximum work load of 7 METS, limited by  shortness of breath and hypertensive response. There were no clearly  diagnostic ST segment abnormalities. Occasional to frequent PVCs and  ventricular couplets were noted early in exercise. There were no  sustained arrhythmias. Perfusion imaging shows probable variable  soft tissue  attenuation, less likely a minor degree of basal lateral  ischemia. LVEF is normal at 61% with upper normal chamber volume and  no obvious wall motion abnormalities.  Diagnostic Studies 08/2013 Labs TC 192 TG 231 LDL HDL 37 LDL 109  K 4.0 Cr 2 BUN 21 GFR 30 TSH 1.80    Assessment and Plan   1. Chest pain  - symptoms have improved - negative exercise MPI, no evidence of ischemia - continue risk factor modification.   2. HTN  - recent admission with severely elevated bp in [place] with systolics >485. BP elevated in clinic  - evidence of end organ damage with diastolic dysfunction and kidney disease  - norvasc stopped after developing LE edema, now resolved - goal bp given her CKD <130/80. Will increase coreg to 25mg  bid and have her submit  bp log in 2 weeks. With her BMI reasonable to titrate up to coreg 50mg  bid if needed, may eventually need to change to labetolol if cannot get her controlled w/ coreg - CT 2006 without renal artery stenosis, normal TSH, no EtoH. Will check renin/aldo level. Multiple risk factors for OSA, refer for sleep study.   3. Diastolic dysfunction  - echo from [place], [place] shows grade II diastolic dysfunction  - patient appears euvolemic, continue current diuretic dose, work to optimize bp control  4. CKD/FSGS - mangment per renal  5. Carotid bruits - check carotid US   F/u 1 month  Arnoldo Lenis, M.D., F.A.C.C.

## 2013-10-08 NOTE — Telephone Encounter (Signed)
Referral to Dr. Redmond Pulling for somnolence, HTN - needs sleep study Left message with Dr Dois Davenport office to schedule appt -srs

## 2013-10-10 ENCOUNTER — Encounter: Payer: Self-pay | Admitting: Cardiology

## 2013-10-12 NOTE — Telephone Encounter (Signed)
Pt wrote in [place] about her BP medication makes her feel like she is going to pass out. Pt's BP was usually 200s/100s. BP this weekend was 151/85 and 131/69. Pulse is ranging between 56-70. Pt states when she sits down she feels better. Pt is wondering if it's her body getting use to the BP getting lower. Coreg at last visit was increased to 25 BID from 12.5 BID.   Please advise

## 2013-10-13 LAB — ALDOSTERONE + RENIN ACTIVITY W/ RATIO
ALDO / PRA Ratio: 6.5 Ratio (ref 0.9–28.9)
Aldosterone: 4 ng/dL
PRA LC/MS/MS: 0.62 ng/mL/h (ref 0.25–5.82)

## 2013-10-15 ENCOUNTER — Encounter (INDEPENDENT_AMBULATORY_CARE_PROVIDER_SITE_OTHER): Payer: BC Managed Care – PPO

## 2013-10-15 DIAGNOSIS — R0989 Other specified symptoms and signs involving the circulatory and respiratory systems: Secondary | ICD-10-CM

## 2013-10-15 NOTE — Telephone Encounter (Signed)
She is scheduled and patient is aware July 29 @ 1:00

## 2013-10-16 ENCOUNTER — Telehealth: Payer: Self-pay | Admitting: *Deleted

## 2013-10-16 MED ORDER — LABETALOL HCL 200 MG PO TABS: 200.0000 mg | ORAL_TABLET | Freq: Two times a day (BID) | ORAL | Status: DC

## 2013-10-16 NOTE — Telephone Encounter (Signed)
Patient notified.  New rx will be sent to [facility]

## 2013-10-16 NOTE — Telephone Encounter (Signed)
Message copied by Laurine Blazer on Fri Oct 16, 2013  3:56 PM ------      Message from: Merlene Laughter      Created: Fri Oct 16, 2013  6:58 AM                   ----- Message -----         From: Truett Mainland, LPN         Sent: 08/27/7822   5:40 PM           To: Merlene Laughter, LPN, Laurine Blazer, LPN            April Ward       ----- Message -----         From: Arnoldo Lenis, MD         Sent: 10/15/2013   3:52 PM           To: Truett Mainland, LPN            We can stop coreg. Start labetalol 200mg  bid. Can she keep a bp log and call us with numbers in 1 week on new medicine.                              Carlyle Dolly MD      ----- Message -----         From: Truett Mainland, LPN         Sent: 2/35/3614   3:32 PM           To: Arnoldo Lenis, MD            She is scared to have a stroke and stated that the 12.5 mg BID did not bring down her BP. She is wondering if her body will get use to the medication and if so how long will it take.      ----- Message -----         From: Arnoldo Lenis, MD         Sent: 10/12/2013   3:05 PM           To: [name], CMA            She may go back to 12.5mg  bid. Continue to keep a bp log for [place] and submit next week            Carlyle Dolly       ----- Message -----         From: [name], CMA         Sent: 10/12/2013   7:16 AM           To: Arnoldo Lenis, MD            Ward states that since the increase of bp med (Coreg 25 mg bid) has been unable to work and has felt like she was going to pass out 3 times. Please advise                         ------

## 2013-10-20 ENCOUNTER — Telehealth: Payer: Self-pay | Admitting: *Deleted

## 2013-10-20 NOTE — Telephone Encounter (Signed)
Message copied by Laurine Blazer on Tue Oct 20, 2013  4:58 PM ------      Message from: Bol[place] F      Created: Mon Oct 19, 2013  7:06 PM       Normal adrenal gland hormone labs, does not appear to be the cause of her HTN ------

## 2013-10-20 NOTE — Telephone Encounter (Signed)
Notes Recorded by Laurine Blazer, LPN on 9/56/3875 at 6:43 PM Patient notified. Has follow up scheduled for 11/18/2013 with Dr. Harl Bowie.

## 2013-10-22 ENCOUNTER — Telehealth (HOSPITAL_COMMUNITY): Payer: Self-pay | Admitting: *Deleted

## 2013-10-22 ENCOUNTER — Telehealth: Payer: Self-pay | Admitting: *Deleted

## 2013-10-22 ENCOUNTER — Ambulatory Visit: Payer: Self-pay | Admitting: Cardiology

## 2013-10-22 ENCOUNTER — Encounter (HOSPITAL_COMMUNITY): Payer: Self-pay | Admitting: Psychiatry

## 2013-10-22 NOTE — Telephone Encounter (Signed)
Patient denies specific plan to hurt others. She cannot deal with stress at work so I have written work excuse until she comes in on Aug 5

## 2013-10-22 NOTE — Telephone Encounter (Signed)
Notes Recorded by Laurine Blazer, LPN on 8/88/9169 at 45:03 AM Patient notified. Follow up scheduled for August with Dr. Harl Bowie.

## 2013-10-22 NOTE — Telephone Encounter (Signed)
Message copied by Laurine Blazer on Thu Oct 22, 2013 10:05 AM ------      Message from: [place] F      Created: Mon Oct 19, 2013  7:06 PM       No significant blockages in the carotids ------

## 2013-11-04 ENCOUNTER — Encounter (HOSPITAL_COMMUNITY): Payer: Self-pay | Admitting: Psychiatry

## 2013-11-04 ENCOUNTER — Ambulatory Visit (INDEPENDENT_AMBULATORY_CARE_PROVIDER_SITE_OTHER): Payer: BC Managed Care – PPO | Admitting: Psychiatry

## 2013-11-04 VITALS — BP 174/86 | Ht 71.0 in | Wt 301.0 lb

## 2013-11-04 DIAGNOSIS — F3160 Bipolar disorder, current episode mixed, unspecified: Secondary | ICD-10-CM

## 2013-11-04 MED ORDER — CARBAMAZEPINE 200 MG PO TABS: ORAL_TABLET | ORAL | Status: DC

## 2013-11-04 MED ORDER — ALPRAZOLAM 1 MG PO TABS: 1.0000 mg | ORAL_TABLET | Freq: Three times a day (TID) | ORAL | Status: DC

## 2013-11-04 MED ORDER — PRAZOSIN HCL 2 MG PO CAPS: 2.0000 mg | ORAL_CAPSULE | Freq: Every day | ORAL | Status: DC

## 2013-11-04 MED ORDER — QUETIAPINE FUMARATE 50 MG PO TABS: 50.0000 mg | ORAL_TABLET | Freq: Every day | ORAL | Status: DC

## 2013-11-04 NOTE — Progress Notes (Signed)
Patient ID: MAHAYLA HADDAWAY, female   DOB: [birth year], 53 y.o.   MRN: [id number] Patient ID: EVAN OSBURN, female   DOB: [birth year], 53 y.o.   MRN: [id number]  Psychiatric Assessment Adult  Patient Identification:  ZYKERIA LAGUARDIA Date of Evaluation:  11/04/2013 Chief Complaint: I've been very stressed at work History of Chief Complaint:   Chief Complaint  Patient presents with  . Anxiety  . Depression  . Follow-up    Anxiety Symptoms include decreased concentration, nervous/anxious behavior and suicidal ideas.     this patient is a 53 year old married black female who lives with her husband, and 61 year old son and 64 year-old nephew  In [place]. 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  The patient was referred by Maurice Small therapist in our office. The patient states that she had a cyst removed in her left kidney in December. After she had the surgery initially she was told everything was okay. She later found that 60% of her kidney was removed and that she had renal cancer. She's very upset because of the conflicting information. She also developed severe anemia and probably will have to on iron infusion. She now doesn't trust the doctors because she doesn't know what to believe. She is scheduled to see an oncologist to make sure she doesn't need any more treatment for cancer. Her nephrologist so has released her and states she needs to come back in 6 months.  The patient travels 3 hours to her job and stays there in an apartment. She's been doing this since June. Now however she doesn't feel able to function. She's been very depressed since the surgery and the news about the cancer. She's been crying all the time. Her thoughts are racing and she is unable to sleep. She's very tired and doesn't have any appetite. She's had passive suicidal ideation but no specific plan. She doesn't want to be around people because she gets so  upset.  The patient had a depressive episode in May. At that time her sisters were upsetting her. Her mother died in 23-Jul-2011 and left her property to the patient and her 2 sisters. 2 sisters are not good about paying bills and the light bill is in her name. Her primary doctor had put her on  Vibryd which helped but her insurance did not cover it so she is no longer taking it. She's never been on any other antidepressants or had previous psychiatric treatment  The patient returns after one month. She had called about 2 weeks ago and stated that she had to leave work. She was having severe chest pain and her blood pressure was quite elevated. She is extremely angry and felt like hurting someone although she knew she wouldn't act on it. I wrote her a excuse to stay out of work until today. She is still very anxious and her blood pressure is still elevated despite being on several medications for this. She's sleeping fairly well but recently had a sleep study and the result is not yet back. She still does not feel like she can handle work because of the stress but she is also stressed because of the financial possibility of losing her employment. We've decided to increase the Xanax to 1 mg 3 times a day to see if this will help the stress as well as get her back into counseling Review of Systems  Constitutional: Positive for appetite change and  fatigue.  Psychiatric/Behavioral: Positive for suicidal ideas, sleep disturbance, dysphoric mood and decreased concentration. The patient is nervous/anxious.    Physical Exam not done Depressive Symptoms: depressed mood, anhedonia, insomnia, psychomotor retardation, fatigue, feelings of worthlessness/guilt, hopelessness, suicidal thoughts without plan, anxiety,  (Hypo) Manic Symptoms:   Elevated Mood:  No Irritable Mood:  No Grandiosity:  No Distractibility:  No Labiality of Mood:  No Delusions:  No Hallucinations:  No Impulsivity:  No Sexually  Inappropriate Behavior:  No Financial Extravagance:  No Flight of Ideas:  No  Anxiety Symptoms: Excessive Worry:  Yes Panic Symptoms:  Yes Agoraphobia:  Yes Obsessive Compulsive: No  Symptoms: None, Specific Phobias:  No Social Anxiety:  Yes  Psychotic Symptoms:  Hallucinations: No None Delusions:  No Paranoia:  No   Ideas of Reference:  No  PTSD Symptoms: Ever had a traumatic exposure:  No Had a traumatic exposure in the last month:  No Re-experiencing: No None Hypervigilance:  No Hyperarousal: No None Avoidance: No None  Traumatic Brain Injury: No   Past Psychiatric History: Diagnosis: Maj. depression   Hospitalizations: None   Outpatient Care: None   Substance Abuse Care: None   Self-Mutilation: None   Suicidal Attempts: None   Violent Behaviors: None    Past Medical History:   Past Medical History  Diagnosis Date  . Hypertension   . Diabetes mellitus without complication   . Heart murmur   . Depression   . Anxiety   . GERD (gastroesophageal reflux disease)   . Arthritis   . Renal disease 10/2012  . Renal cancer   . Renal insufficiency    History of Loss of Consciousness:  No Seizure History:  No Cardiac History:  No Allergies:   Allergies  Allergen Reactions  . Skelaxin [Metaxalone] Hives   Current Medications:  Current Outpatient Prescriptions  Medication Sig Dispense Refill  . ALPRAZolam (XANAX) 1 MG tablet Take 1 tablet (1 mg total) by mouth 3 (three) times daily.  90 tablet  0  . amLODipine (NORVASC) 5 MG tablet       . buPROPion (WELLBUTRIN XL) 150 MG 24 hr tablet Take 1 tablet (150 mg total) by mouth every morning.  30 tablet  2  . carbamazepine (TEGRETOL) 200 MG tablet Take one tablet at bedtime for one week, then take two at bedtime  60 tablet  2  . carvedilol (COREG) 12.5 MG tablet       . colchicine 0.6 MG tablet       . furosemide (LASIX) 40 MG tablet Take 1 tablet (40 mg total) by mouth 2 (two) times daily.      . hydrALAZINE  (APRESOLINE) 100 MG tablet Take 1 tablet (100 mg total) by mouth 3 (three) times daily.      Marland Kitchen HYDROcodone-acetaminophen (NORCO/VICODIN) 5-325 MG per tablet Take 1 tablet by mouth every 6 (six) hours as needed for moderate pain.  56 tablet  0  . labetalol (NORMODYNE) 200 MG tablet Take 1 tablet (200 mg total) by mouth 2 (two) times daily.  60 tablet  6  . linagliptin (TRADJENTA) 5 MG TABS tablet Take 5 mg by mouth daily.      Marland Kitchen losartan (COZAAR) 50 MG tablet Take 50 mg by mouth daily.      Marland Kitchen omeprazole (PRILOSEC) 20 MG capsule Take 20 mg by mouth daily.      . prazosin (MINIPRESS) 2 MG capsule Take 1 capsule (2 mg total) by mouth at bedtime.  30 capsule  2  . QUEtiapine (SEROQUEL) 50 MG tablet Take 1 tablet (50 mg total) by mouth at bedtime.  30 tablet  2  . ULORIC 40 MG tablet       . Vitamin D, Ergocalciferol, (DRISDOL) 50000 UNITS CAPS capsule        No current facility-administered medications for this visit.    Previous Psychotropic Medications:  Medication Dose   Xanax   0.5 mg 3 times a day   Vibryd  40 mg every morning                   Substance Abuse History in the last 12 months: Substance Age of 1st Use Last Use Amount Specific Type  Nicotine      Alcohol      Cannabis      Opiates      Cocaine      Methamphetamines      LSD      Ecstasy      Benzodiazepines      Caffeine      Inhalants      Others:                          Medical Consequences of Substance Abuse:n/a  Legal Consequences of Substance Abuse: n/a  Family Consequences of Substance Abuse: n/a  Blackouts:  No DT's:  No Withdrawal Symptoms:  No None  Social History: Current Place of Residence: [place] of Birth: [place] Family Members: Husband 2 children, one nephew 4 grandchildren Marital Status:  Married Children:   Sons: 1  Daughters: 1 Relationships:  Education:  Dentist Problems/Performance:  Religious Beliefs/Practices:  Christian History of Abuse: none Occupational Experiences; 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 Legal History: None Hobbies/Interests: Traveling, visiting with grandchildren  Family History:   Family History  Problem Relation Age of Onset  . Heart attack Mother   . Heart attack Father   . Depression Paternal Aunt   . Alcohol abuse Maternal Uncle     Mental Status Examination/Evaluation: Objective:  Appearance: Casual and Fairly Groomed  Engineer, water::  Fair  Speech:  Clear and Coherent  Volume:  Normal  Mood:  Anxious   Affect:  Depressed  Thought Process:  Intact  Orientation:  Full (Time, Place, and Person)  Thought Content:  WDL  Suicidal Thoughts:no  Homicidal Thoughts:  Yes without intent or plan   Judgement:  Intact  Insight:  Good  Psychomotor Activity:  Normal  Akathisia:  No  Handed:  Right  AIMS (if indicated):    Assets:  Communication Skills Desire for Improvement    Laboratory/X-Ray Psychological Evaluation(s)       Assessment:  Axis I: Generalized Anxiety Disorder and Major Depression, Recurrent severe  AXIS I Generalized Anxiety Disorder and Major Depression, Recurrent severe  AXIS II Deferred  AXIS III Past Medical History  Diagnosis Date  . Hypertension   . Diabetes mellitus without complication   . Heart murmur   . Depression   . Anxiety   . GERD (gastroesophageal reflux disease)   . Arthritis   . Renal disease 10/2012  . Renal cancer   . Renal insufficiency      AXIS IV other psychosocial or environmental problems  AXIS V 51-60 moderate symptoms   Treatment Plan/Recommendations:  Plan of Care: Medication management   Laboratory:   Psychotherapy: The patient is seeing Maurice Small here and will  reschedule with her   Medications: She'll continue her current medications but increase Xanax to 1 mg 3 times a day   Routine PRN Medications:  No  Consultations:   Safety Concerns:  She contracts for  safety   Other:    return in 4 weeks     ROSS, Neoma Laming, MD 8/5/201510:50 AM

## 2013-11-18 ENCOUNTER — Encounter: Payer: Self-pay | Admitting: Cardiology

## 2013-11-18 ENCOUNTER — Ambulatory Visit (INDEPENDENT_AMBULATORY_CARE_PROVIDER_SITE_OTHER): Payer: BC Managed Care – PPO | Admitting: Cardiology

## 2013-11-18 VITALS — BP 176/103 | HR 67 | Ht 71.0 in | Wt 304.0 lb

## 2013-11-18 DIAGNOSIS — I10 Essential (primary) hypertension: Secondary | ICD-10-CM

## 2013-11-18 DIAGNOSIS — I5189 Other ill-defined heart diseases: Secondary | ICD-10-CM

## 2013-11-18 DIAGNOSIS — I519 Heart disease, unspecified: Secondary | ICD-10-CM

## 2013-11-18 MED ORDER — LABETALOL HCL 200 MG PO TABS
400.0000 mg | ORAL_TABLET | Freq: Two times a day (BID) | ORAL | Status: DC
Start: 2013-11-18 — End: 2014-12-14

## 2013-11-18 NOTE — Progress Notes (Signed)
Clinical Summary Ms. Alberson is a 53 y.o.female seen today for follow up of the following medical problems.   1. Chest pain  - started approx 1 month ago, 3 total episodes  - pressing like feeling in midchest, 8/10. Can have numbness in left arm. +SOB. +diaphoretic. +dizzy. Nothing worst. Tried NG with help in symptoms. Pains lasts few minutes.  - admission [facility] 09/01/13 with chest pain and dizziness, bp 220/90. No evidence of ACS  - echo LVEF 55-60%, grade II diastolic dysfunction.  - extensive workup by neuro including carotid US and MRI for her dizziness without clear pathology  +DOE that has progressed over the last few months  08/2013 MPI low risk. Probable soft tissue attenuation, LVEF 61%.  - denies any recent symptoms  2. HTN  - reports diagnosed with HTN age 50.  - checks at home 2 times a day. Typically around 160- 170s/80s  - compliant with meds  - limiting sodium intake - CT scan in 2006 with normal renal arteries. TSH 1.80. No EtoH. Normal renin and aldo. Since last visit she was tested for sleep apnea, I do not have the results but she reports she was told it was negative.   - last visit increased coreg to 25 to bid, noted pulses low 60s and felt very lightheaded. Her ephrologist changed to labetalol a few weeks ago from coreg which she is tolerating better.    3. CKD  - she is followed at [facility] by nephrology. As part of workup for AKI US showed 11 cm cyst. She is s/p partial nephrectomy 03/2013 with cath showing renal cell CA, and biopsy of normal kidney showing FSGS.   Past Medical History  Diagnosis Date  . Hypertension   . Diabetes mellitus without complication   . Heart murmur   . Depression   . Anxiety   . GERD (gastroesophageal reflux disease)   . Arthritis   . Renal disease 10/2012  . Renal cancer   . Renal insufficiency      Allergies  Allergen Reactions  . Skelaxin [Metaxalone] Hives     Current Outpatient Prescriptions  Medication Sig  Dispense Refill  . ALPRAZolam (XANAX) 1 MG tablet Take 1 tablet (1 mg total) by mouth 3 (three) times daily.  90 tablet  0  . amLODipine (NORVASC) 5 MG tablet       . buPROPion (WELLBUTRIN XL) 150 MG 24 hr tablet Take 1 tablet (150 mg total) by mouth every morning.  30 tablet  2  . carbamazepine (TEGRETOL) 200 MG tablet Take one tablet at bedtime for one week, then take two at bedtime  60 tablet  2  . carvedilol (COREG) 12.5 MG tablet       . colchicine 0.6 MG tablet       . furosemide (LASIX) 40 MG tablet Take 1 tablet (40 mg total) by mouth 2 (two) times daily.      . hydrALAZINE (APRESOLINE) 100 MG tablet Take 1 tablet (100 mg total) by mouth 3 (three) times daily.      Marland Kitchen HYDROcodone-acetaminophen (NORCO/VICODIN) 5-325 MG per tablet Take 1 tablet by mouth every 6 (six) hours as needed for moderate pain.  56 tablet  0  . labetalol (NORMODYNE) 200 MG tablet Take 1 tablet (200 mg total) by mouth 2 (two) times daily.  60 tablet  6  . linagliptin (TRADJENTA) 5 MG TABS tablet Take 5 mg by mouth daily.      Marland Kitchen losartan (  COZAAR) 50 MG tablet Take 50 mg by mouth daily.      Marland Kitchen omeprazole (PRILOSEC) 20 MG capsule Take 20 mg by mouth daily.      . prazosin (MINIPRESS) 2 MG capsule Take 1 capsule (2 mg total) by mouth at bedtime.  30 capsule  2  . QUEtiapine (SEROQUEL) 50 MG tablet Take 1 tablet (50 mg total) by mouth at bedtime.  30 tablet  2  . ULORIC 40 MG tablet       . Vitamin D, Ergocalciferol, (DRISDOL) 50000 UNITS CAPS capsule        No current facility-administered medications for this visit.     Past Surgical History  Procedure Laterality Date  . Cesarean section  [id number]  . Knee arthroscopy with medial menisectomy Right 08/22/2012    Procedure: KNEE ARTHROSCOPY WITH PARTIAL MEDIAL MENISECTOMY;  Surgeon: Carole Civil, MD;  Location: [facility];  Service: Orthopedics;  Laterality: Right;  . Chondroplasty Right 08/22/2012    Procedure: CHONDROPLASTY;  Surgeon: Carole Civil, MD;   Location: [facility];  Service: Orthopedics;  Laterality: Right;  . Partial nephrectomy       Allergies  Allergen Reactions  . Skelaxin [Metaxalone] Hives      Family History  Problem Relation Age of Onset  . Heart attack Mother   . Heart attack Father   . Depression Paternal Aunt   . Alcohol abuse Maternal Uncle      Social History Ms. Fitzgibbon reports that she has never smoked. She has never used smokeless tobacco. Ms. Lookingbill reports that she does not drink alcohol.   Review of Systems CONSTITUTIONAL: No weight loss, fever, chills, weakness or fatigue.  HEENT: Eyes: No visual loss, blurred vision, double vision or yellow sclerae.No hearing loss, sneezing, congestion, runny nose or sore throat.  SKIN: No rash or itching.  CARDIOVASCULAR: per HPI RESPIRATORY: No shortness of breath, cough or sputum.  GASTROINTESTINAL: No anorexia, nausea, vomiting or diarrhea. No abdominal pain or blood.  GENITOURINARY: No burning on urination, no polyuria NEUROLOGICAL: No headache, dizziness, syncope, paralysis, ataxia, numbness or tingling in the extremities. No change in bowel or bladder control.  MUSCULOSKELETAL: No muscle, back pain, joint pain or stiffness.  LYMPHATICS: No enlarged nodes. No history of splenectomy.  PSYCHIATRIC: No history of depression or anxiety.  ENDOCRINOLOGIC: No reports of sweating, cold or heat intolerance. No polyuria or polydipsia.  Marland Kitchen   Physical Examination p 67 bp 170/80 Wt 304 lbs BMI 42 Gen: resting comfortably, no acute distress HEENT: no scleral icterus, pupils equal round and reactive, no palptable cervical adenopathy,  CV: RRR, 2/6 systolic murmur RUSB, no JVD, + bilateral carotid bruits Resp: Clear to auscultation bilaterally GI: abdomen is soft, non-tender, non-distended, normal bowel sounds, no hepatosplenomegaly MSK: extremities are warm, no edema.  Skin: warm, no rash Neuro:  no focal deficits Psych: appropriate affect   Diagnostic  Studies 08/2013 Labs  TC 192 TG 231 LDL HDL 37 LDL 109  K 4.0 Cr 2 BUN 21 GFR 30 TSH 1.80  09/2013 Carotid US 1-39% bilateral ICA disease    Assessment and Plan  1. Chest pain  - symptoms have improved  - negative exercise MPI, no evidence of ischemia  - continue risk factor modification.   2. Resistant HTN  - recent admission with severely elevated bp in [place] with systolics >709. BP elevated in clinic  - evidence of end organ damage with diastolic dysfunction and kidney disease  - norvasc stopped after developing  LE edema, now resolved. Did not tolerate higher doses of coreg, changed to labetalol which she is tolerating.  - goal bp given her CKD <130/80.  - CT 2006 without renal artery stenosis, normal TSH, no EtoH. Normal renin to aldo ratio. Reportedly negative sleep study. No clear cause of potential secondary HTN - despite resistant HTN, avoid aldactone due to renal function - will increase labetalol to 400mg  bid, she is to call in 1 week with bp log values.   3. Diastolic dysfunction  - echo from [place], [place] shows grade II diastolic dysfunction  - patient appears euvolemic, continue current diuretic dose, work to optimize bp control   4. CKD/FSGS  - mangment per renal   5. Carotid bruits  - no significant disease by [place]   F/u 3 months       Arnoldo Lenis, M.D., F.A.C.C.

## 2013-11-18 NOTE — Patient Instructions (Signed)
   Increase Labetalol to 400mg  twice a day  - new sent to pharm Continue all other medications.   Your physician has requested that you regularly monitor and record your blood pressure readings at home. Please take reading approximately 2 hours after medication x 1 week & return to office for MD review. Follow up in  3 months

## 2013-11-21 ENCOUNTER — Emergency Department (HOSPITAL_COMMUNITY): Payer: BC Managed Care – PPO

## 2013-11-21 ENCOUNTER — Encounter (HOSPITAL_COMMUNITY): Payer: Self-pay | Admitting: Emergency Medicine

## 2013-11-21 ENCOUNTER — Emergency Department (HOSPITAL_COMMUNITY)
Admission: EM | Admit: 2013-11-21 | Discharge: 2013-11-21 | Disposition: A | Discharge status: Home / Self Care | Payer: BC Managed Care – PPO | Attending: Emergency Medicine | Admitting: Emergency Medicine

## 2013-11-21 DIAGNOSIS — Z79899 Other long term (current) drug therapy: Secondary | ICD-10-CM | POA: Diagnosis not present

## 2013-11-21 DIAGNOSIS — Z85528 Personal history of other malignant neoplasm of kidney: Secondary | ICD-10-CM | POA: Insufficient documentation

## 2013-11-21 DIAGNOSIS — E119 Type 2 diabetes mellitus without complications: Secondary | ICD-10-CM | POA: Insufficient documentation

## 2013-11-21 DIAGNOSIS — K219 Gastro-esophageal reflux disease without esophagitis: Secondary | ICD-10-CM | POA: Insufficient documentation

## 2013-11-21 DIAGNOSIS — Y9389 Activity, other specified: Secondary | ICD-10-CM | POA: Diagnosis not present

## 2013-11-21 DIAGNOSIS — F411 Generalized anxiety disorder: Secondary | ICD-10-CM | POA: Diagnosis not present

## 2013-11-21 DIAGNOSIS — Z87448 Personal history of other diseases of urinary system: Secondary | ICD-10-CM | POA: Diagnosis not present

## 2013-11-21 DIAGNOSIS — M129 Arthropathy, unspecified: Secondary | ICD-10-CM | POA: Diagnosis not present

## 2013-11-21 DIAGNOSIS — S99929A Unspecified injury of unspecified foot, initial encounter: Secondary | ICD-10-CM

## 2013-11-21 DIAGNOSIS — X500XXA Overexertion from strenuous movement or load, initial encounter: Secondary | ICD-10-CM | POA: Diagnosis not present

## 2013-11-21 DIAGNOSIS — F3289 Other specified depressive episodes: Secondary | ICD-10-CM | POA: Insufficient documentation

## 2013-11-21 DIAGNOSIS — S93409A Sprain of unspecified ligament of unspecified ankle, initial encounter: Secondary | ICD-10-CM | POA: Diagnosis not present

## 2013-11-21 DIAGNOSIS — S8990XA Unspecified injury of unspecified lower leg, initial encounter: Secondary | ICD-10-CM | POA: Diagnosis present

## 2013-11-21 DIAGNOSIS — Y9289 Other specified places as the place of occurrence of the external cause: Secondary | ICD-10-CM | POA: Diagnosis not present

## 2013-11-21 DIAGNOSIS — F329 Major depressive disorder, single episode, unspecified: Secondary | ICD-10-CM | POA: Diagnosis not present

## 2013-11-21 DIAGNOSIS — S93401A Sprain of unspecified ligament of right ankle, initial encounter: Secondary | ICD-10-CM

## 2013-11-21 DIAGNOSIS — I10 Essential (primary) hypertension: Secondary | ICD-10-CM | POA: Diagnosis not present

## 2013-11-21 DIAGNOSIS — R011 Cardiac murmur, unspecified: Secondary | ICD-10-CM | POA: Insufficient documentation

## 2013-11-21 DIAGNOSIS — S99919A Unspecified injury of unspecified ankle, initial encounter: Secondary | ICD-10-CM

## 2013-11-21 MED ORDER — HYDROCODONE-ACETAMINOPHEN 5-325 MG PO TABS: 1.0000 | ORAL_TABLET | ORAL | Status: DC | PRN

## 2013-11-21 NOTE — ED Provider Notes (Signed)
CSN: [id number]     Arrival date & time 11/21/13  8099 History   First MD Initiated Contact with Patient 11/21/13 [phone]     Chief Complaint  Patient presents with  . Ankle Pain     (Consider location/radiation/quality/duration/timing/severity/associated sxs/prior Treatment) Patient is a 53 y.o. female presenting with ankle pain. The history is provided by the patient. No language interpreter was used.  Ankle Pain Location:  Ankle Time since incident:  1 day Injury: yes   Mechanism of injury: fall   Fall:    Fall occurred:  Walking   Impact surface:  Concrete   Entrapped after fall: no   Ankle location:  R ankle Pain details:    Quality:  Aching   Radiates to:  Does not radiate   Severity:  Severe   Onset quality:  Sudden   Timing:  Constant   Progression:  Worsening Chronicity:  New Dislocation: no   Foreign body present:  No foreign bodies Prior injury to area:  No Worsened by:  Activity and bearing weight Ineffective treatments:  Ice, heat and elevation Associated symptoms: swelling   Risk factors: obesity    April Ward is a 53 y.o. female who presents to the ED with right ankle pain that started yesterday. She was stepping off a curb and turned her ankle inward. She complains of pain mostly to the medial aspect of the ankle. She has tried ice, heat, elevation, tylenol and rest without relief. This morning the pain has increases. PMH significant for Kidney Cancer and other as stated below in PMH. She can not take NSAIDS.   Past Medical History  Diagnosis Date  . Hypertension   . Diabetes mellitus without complication   . Heart murmur   . Depression   . Anxiety   . GERD (gastroesophageal reflux disease)   . Arthritis   . Renal disease 10/2012  . Renal cancer   . Renal insufficiency    Past Surgical History  Procedure Laterality Date  . Cesarean section  [id number]  . Knee arthroscopy with medial menisectomy Right 08/22/2012    Procedure: KNEE ARTHROSCOPY WITH  PARTIAL MEDIAL MENISECTOMY;  Surgeon: Carole Civil, MD;  Location: [facility];  Service: Orthopedics;  Laterality: Right;  . Chondroplasty Right 08/22/2012    Procedure: CHONDROPLASTY;  Surgeon: Carole Civil, MD;  Location: [facility];  Service: Orthopedics;  Laterality: Right;  . Partial nephrectomy     Family History  Problem Relation Age of Onset  . Heart attack Mother   . Heart attack Father   . Depression Paternal Aunt   . Alcohol abuse Maternal Uncle    History  Substance Use Topics  . Smoking status: Never Smoker   . Smokeless tobacco: Never Used  . Alcohol Use: No   OB History   Grav Para Term Preterm Abortions TAB SAB Ect Mult Living                 Review of Systems Right ankle pain  All other systems negative.   Allergies  Skelaxin  Home Medications   Prior to Admission medications   Medication Sig Start Date End Date Taking? Authorizing Provider  ALPRAZolam Duanne Moron) 1 MG tablet Take 1 tablet (1 mg total) by mouth 3 (three) times daily. 11/04/13 11/04/14  [name], MD  buPROPion (WELLBUTRIN XL) 150 MG 24 hr tablet Take 1 tablet (150 mg total) by mouth every morning. 09/23/13 09/23/14  [name], MD  carbamazepine (TEGRETOL) 200 MG tablet  Take one tablet at bedtime for one week, then take two at bedtime 11/04/13   [name], MD  colchicine 0.6 MG tablet 0.6 mg as needed.  10/20/13   Historical Provider, MD  furosemide (LASIX) 40 MG tablet Take 1 tablet (40 mg total) by mouth 2 (two) times daily. 09/23/13   Arnoldo Lenis, MD  hydrALAZINE (APRESOLINE) 100 MG tablet Take 1 tablet (100 mg total) by mouth 3 (three) times daily. 09/23/13   Arnoldo Lenis, MD  HYDROcodone-acetaminophen (NORCO/VICODIN) 5-325 MG per tablet Take 1 tablet by mouth every 4 (four) hours as needed. 11/21/13   Hope Bunnie Pion, NP  labetalol (NORMODYNE) 200 MG tablet Take 2 tablets (400 mg total) by mouth 2 (two) times daily. 11/18/13   Arnoldo Lenis, MD  linagliptin (TRADJENTA) 5 MG TABS  tablet Take 5 mg by mouth daily.    Historical Provider, MD  losartan (COZAAR) 50 MG tablet Take 50 mg by mouth daily.    Historical Provider, MD  omeprazole (PRILOSEC) 20 MG capsule Take 20 mg by mouth daily.    Historical Provider, MD  prazosin (MINIPRESS) 2 MG capsule Take 1 capsule (2 mg total) by mouth at bedtime. 11/04/13   [name], MD  QUEtiapine (SEROQUEL) 50 MG tablet Take 1 tablet (50 mg total) by mouth at bedtime. 11/04/13 11/04/14  [name], MD  ULORIC 40 MG tablet 40 mg daily.  10/16/13   Historical Provider, MD   BP 183/74  Pulse 61  Temp(Src) 97.9 F (36.6 C) (Oral)  Resp 18  Ht 5' 11.5" (1.816 m)  Wt 290 lb (131.543 kg)  BMI 39.89 kg/m2  SpO2 100% Physical Exam  Nursing note and vitals reviewed. Constitutional: She is oriented to person, place, and time. She appears well-developed and well-nourished. No distress.  HENT:  Head: Normocephalic.  Eyes: EOM are normal.  Neck: Neck supple.  Cardiovascular: Normal rate.   Pulmonary/Chest: Effort normal.  Abdominal: Soft. There is no tenderness.  Musculoskeletal:       Right ankle: She exhibits swelling. She exhibits no ecchymosis, no deformity, no laceration and normal pulse. Decreased range of motion: due to pain. Tenderness. Medial malleolus tenderness found. Achilles tendon normal.  Neurological: She is alert and oriented to person, place, and time. She has normal strength. No cranial nerve deficit or sensory deficit.  Pedal pulses equal, adequate circulation. Equal strength with plantar and dorsiflexion. Pain with internal rotation of the right ankle. Pain with ambulation.   Skin: Skin is warm and dry.  Psychiatric: She has a normal mood and affect. Her behavior is normal.    ED Course  Procedures (including critical care time) Labs Review Dg Ankle Complete Right  11/21/2013   CLINICAL DATA:  Right ankle pain after fall.  EXAM: RIGHT ANKLE - COMPLETE 3+ VIEW  COMPARISON:  None.  FINDINGS: No acute fracture or  dislocation is noted. Spurring of posterior calcaneus is noted. Old fracture involving medial malleolus is noted. Talar dome is intact.  IMPRESSION: No acute fracture or dislocation is noted.   Electronically Signed   By: Sabino Dick M.D.   On: 11/21/2013 08:32    MDM  53 y.o. female with right ankle pain to the medical aspect s/p injury yesterday. Placed in ASO, ice applied, crutches and ortho referral. I have reviewed this patient's vital signs, nurses notes, appropriate labs and imaging.  I discussed side effects of narcotic pain medication and cautioned regarding driving or activity that may cause injury while  taking the medication. I have discussed findings and plan of care with the patient and she voices understanding.    Medication List    TAKE these medications       HYDROcodone-acetaminophen 5-325 MG per tablet  Commonly known as:  NORCO/VICODIN  Take 1 tablet by mouth every 4 (four) hours as needed.      ASK your doctor about these medications       ALPRAZolam 1 MG tablet  Commonly known as:  XANAX  Take 1 tablet (1 mg total) by mouth 3 (three) times daily.     buPROPion 150 MG 24 hr tablet  Commonly known as:  WELLBUTRIN XL  Take 1 tablet (150 mg total) by mouth every morning.     carbamazepine 200 MG tablet  Commonly known as:  TEGRETOL  Take one tablet at bedtime for one week, then take two at bedtime     colchicine 0.6 MG tablet  0.6 mg as needed.     furosemide 40 MG tablet  Commonly known as:  LASIX  Take 1 tablet (40 mg total) by mouth 2 (two) times daily.     hydrALAZINE 100 MG tablet  Commonly known as:  APRESOLINE  Take 1 tablet (100 mg total) by mouth 3 (three) times daily.     labetalol 200 MG tablet  Commonly known as:  NORMODYNE  Take 2 tablets (400 mg total) by mouth 2 (two) times daily.     linagliptin 5 MG Tabs tablet  Commonly known as:  TRADJENTA  Take 5 mg by mouth daily.     losartan 50 MG tablet  Commonly known as:  COZAAR  Take 50 mg  by mouth daily.     omeprazole 20 MG capsule  Commonly known as:  PRILOSEC  Take 20 mg by mouth daily.     prazosin 2 MG capsule  Commonly known as:  MINIPRESS  Take 1 capsule (2 mg total) by mouth at bedtime.     QUEtiapine 50 MG tablet  Commonly known as:  SEROQUEL  Take 1 tablet (50 mg total) by mouth at bedtime.     ULORIC 40 MG tablet  Generic drug:  febuxostat  40 mg daily.        Final diagnoses:  Ankle sprain, right, initial encounter      [facility], NP 11/21/13 [phone]

## 2013-11-21 NOTE — ED Notes (Signed)
MD at bedside. 

## 2013-11-21 NOTE — Discharge Instructions (Signed)
Elevate the ankle, apply ice, follow up with Dr. Aline Brochure if symptoms persist. Do not drive while taking the narcotic pain medication as it will make you sleepy. Return as needed.   Ankle Sprain An ankle sprain is an injury to the strong, fibrous tissues (ligaments) that hold your ankle bones together.  HOME CARE   Put ice on your ankle for 1-2 days or as told by your doctor.  Put ice in a plastic bag.  Place a towel between your skin and the bag.  Leave the ice on for 15-20 minutes at a time, every 2 hours while you are awake.  Only take medicine as told by your doctor.  Raise (elevate) your injured ankle above the level of your heart as much as possible for 2-3 days.  Use crutches if your doctor tells you to. Slowly put your own weight on the affected ankle. Use the crutches until you can walk without pain.  If you have a plaster splint:  Do not rest it on anything harder than a pillow for 24 hours.  Do not put weight on it.  Do not get it wet.  Take it off to shower or bathe.  If given, use an elastic wrap or support stocking for support. Take the wrap off if your toes lose feeling (numb), tingle, or turn cold or blue.  If you have an air splint:  Add or let out air to make it comfortable.  Take it off at night and to shower and bathe.  Wiggle your toes and move your ankle up and down often while you are wearing it. GET HELP IF:  You have rapidly increasing bruising or puffiness (swelling).  Your toes feel very cold.  You lose feeling in your foot.  Your medicine does not help your pain. GET HELP RIGHT AWAY IF:   Your toes lose feeling (numb) or turn blue.  You have severe pain that is increasing. MAKE SURE YOU:   Understand these instructions.  Will watch your condition.  Will get help right away if you are not doing well or get worse. Document Released: 09/05/2007 Document Revised: 08/03/2013 Document Reviewed: 10/01/2011 [facility] Patient Information  2015 [place], [place]. This information is not intended to replace advice given to you by your health care provider. Make sure you discuss any questions you have with your health care provider.

## 2013-11-21 NOTE — ED Notes (Signed)
Pt stepped off a curb wrong yesterday evening twisting right ankle, cms intact distal, c/o pain to right ankle,

## 2013-11-21 NOTE — ED Provider Notes (Signed)
Medical screening examination/treatment/procedure(s) were performed by non-physician practitioner and as supervising physician I was immediately available for consultation/collaboration.   EKG Interpretation None       Jasper Riling. Alvino Chapel, MD 11/21/13 [phone]

## 2013-11-27 ENCOUNTER — Ambulatory Visit (INDEPENDENT_AMBULATORY_CARE_PROVIDER_SITE_OTHER): Payer: BC Managed Care – PPO | Admitting: Podiatry

## 2013-11-27 ENCOUNTER — Encounter: Payer: Self-pay | Admitting: Podiatry

## 2013-11-27 VITALS — BP 191/90 | HR 64 | Resp 16

## 2013-11-27 DIAGNOSIS — M79609 Pain in unspecified limb: Secondary | ICD-10-CM

## 2013-11-27 DIAGNOSIS — M79604 Pain in right leg: Secondary | ICD-10-CM

## 2013-11-27 DIAGNOSIS — M21619 Bunion of unspecified foot: Secondary | ICD-10-CM

## 2013-11-27 DIAGNOSIS — M2010 Hallux valgus (acquired), unspecified foot: Secondary | ICD-10-CM

## 2013-11-27 DIAGNOSIS — S93409A Sprain of unspecified ligament of unspecified ankle, initial encounter: Secondary | ICD-10-CM

## 2013-11-27 NOTE — Patient Instructions (Signed)
Acute Ankle Sprain with Phase I Rehab An acute ankle sprain is a partial or complete tear in one or more of the ligaments of the ankle due to traumatic injury. The severity of the injury depends on both the number of ligaments sprained and the grade of sprain. There are 3 grades of sprains.   A grade 1 sprain is a mild sprain. There is a slight pull without obvious tearing. There is no loss of strength, and the muscle and ligament are the correct length.  A grade 2 sprain is a moderate sprain. There is tearing of fibers within the substance of the ligament where it connects two bones or two cartilages. The length of the ligament is increased, and there is usually decreased strength.  A grade 3 sprain is a complete rupture of the ligament and is uncommon. In addition to the grade of sprain, there are three types of ankle sprains.  Lateral ankle sprains: This is a sprain of one or more of the three ligaments on the outer side (lateral) of the ankle. These are the most common sprains. Medial ankle sprains: There is one large triangular ligament of the inner side (medial) of the ankle that is susceptible to injury. Medial ankle sprains are less common. Syndesmosis, "high ankle," sprains: The syndesmosis is the ligament that connects the two bones of the lower leg. Syndesmosis sprains usually only occur with very severe ankle sprains. SYMPTOMS  Pain, tenderness, and swelling in the ankle, starting at the side of injury that may progress to the whole ankle and foot with time.  "Pop" or tearing sensation at the time of injury.  Bruising that may spread to the heel.  Impaired ability to walk soon after injury. CAUSES   Acute ankle sprains are caused by trauma placed on the ankle that temporarily forces or pries the anklebone (talus) out of its normal socket.  Stretching or tearing of the ligaments that normally hold the joint in place (usually due to a twisting injury). RISK INCREASES  WITH:  Previous ankle sprain.  Sports in which the foot may land awkwardly (i.e., basketball, volleyball, or soccer) or walking or running on uneven or rough surfaces.  Shoes with inadequate support to prevent sideways motion when stress occurs.  Poor strength and flexibility.  Poor balance skills.  Contact sports. PREVENTION   Warm up and stretch properly before activity.  Maintain physical fitness:  Ankle and leg flexibility, muscle strength, and endurance.  Cardiovascular fitness.  Balance training activities.  Use proper technique and have a coach correct improper technique.  Taping, protective strapping, bracing, or high-top tennis shoes may help prevent injury. Initially, tape is best; however, it loses most of its support function within 10 to 15 minutes.  Wear proper-fitted protective shoes (High-top shoes with taping or bracing is more effective than either alone).  Provide the ankle with support during sports and practice activities for 12 months following injury. PROGNOSIS   If treated properly, ankle sprains can be expected to recover completely; however, the length of recovery depends on the degree of injury.  A grade 1 sprain usually heals enough in 5 to 7 days to allow modified activity and requires an average of 6 weeks to heal completely.  A grade 2 sprain requires 6 to 10 weeks to heal completely.  A grade 3 sprain requires 12 to 16 weeks to heal.  A syndesmosis sprain often takes more than 3 months to heal. RELATED COMPLICATIONS   Frequent recurrence of symptoms may   result in a chronic problem. Appropriately addressing the problem the first time decreases the frequency of recurrence and optimizes healing time. Severity of the initial sprain does not predict the likelihood of later instability.  Injury to other structures (bone, cartilage, or tendon).  A chronically unstable or arthritic ankle joint is a possibility with repeated  sprains. TREATMENT Treatment initially involves the use of ice, medication, and compression bandages to help reduce pain and inflammation. Ankle sprains are usually immobilized in a walking cast or boot to allow for healing. Crutches may be recommended to reduce pressure on the injury. After immobilization, strengthening and stretching exercises may be necessary to regain strength and a full range of motion. Surgery is rarely needed to treat ankle sprains. MEDICATION   Nonsteroidal anti-inflammatory medications, such as aspirin and ibuprofen (do not take for the first 3 days after injury or within 7 days before surgery), or other minor pain relievers, such as acetaminophen, are often recommended. Take these as directed by your caregiver. Contact your caregiver immediately if any bleeding, stomach upset, or signs of an allergic reaction occur from these medications.  Ointments applied to the skin may be helpful.  Pain relievers may be prescribed as necessary by your caregiver. Do not take prescription pain medication for longer than 4 to 7 days. Use only as directed and only as much as you need. HEAT AND COLD  Cold treatment (icing) is used to relieve pain and reduce inflammation for acute and chronic cases. Cold should be applied for 10 to 15 minutes every 2 to 3 hours for inflammation and pain and immediately after any activity that aggravates your symptoms. Use ice packs or an ice massage.  Heat treatment may be used before performing stretching and strengthening activities prescribed by your caregiver. Use a heat pack or a warm soak. SEEK IMMEDIATE MEDICAL CARE IF:   Pain, swelling, or bruising worsens despite treatment.  You experience pain, numbness, discoloration, or coldness in the foot or toes.  New, unexplained symptoms develop (drugs used in treatment may produce side effects.) EXERCISES  PHASE I EXERCISES RANGE OF MOTION (ROM) AND STRETCHING EXERCISES - Ankle Sprain, Acute Phase I,  Weeks 1 to 2 These exercises may help you when beginning to restore flexibility in your ankle. You will likely work on these exercises for the 1 to 2 weeks after your injury. Once your physician, physical therapist, or athletic trainer sees adequate progress, he or she will advance your exercises. While completing these exercises, remember:   Restoring tissue flexibility helps normal motion to return to the joints. This allows healthier, less painful movement and activity.  An effective stretch should be held for at least 30 seconds.  A stretch should never be painful. You should only feel a gentle lengthening or release in the stretched tissue. RANGE OF MOTION - Dorsi/Plantar Flexion  While sitting with your right / left knee straight, draw the top of your foot upwards by flexing your ankle. Then reverse the motion, pointing your toes downward.  Hold each position for __________ seconds.  After completing your first set of exercises, repeat this exercise with your knee bent. Repeat __________ times. Complete this exercise __________ times per day.  RANGE OF MOTION - Ankle Alphabet  Imagine your right / left big toe is a pen.  Keeping your hip and knee still, write out the entire alphabet with your "pen." Make the letters as large as you can without increasing any discomfort. Repeat __________ times. Complete this exercise __________   times per day.  STRENGTHENING EXERCISES - Ankle Sprain, Acute -Phase I, Weeks 1 to 2 These exercises may help you when beginning to restore strength in your ankle. You will likely work on these exercises for 1 to 2 weeks after your injury. Once your physician, physical therapist, or athletic trainer sees adequate progress, he or she will advance your exercises. While completing these exercises, remember:   Muscles can gain both the endurance and the strength needed for everyday activities through controlled exercises.  Complete these exercises as instructed by  your physician, physical therapist, or athletic trainer. Progress the resistance and repetitions only as guided.  You may experience muscle soreness or fatigue, but the pain or discomfort you are trying to eliminate should never worsen during these exercises. If this pain does worsen, stop and make certain you are following the directions exactly. If the pain is still present after adjustments, discontinue the exercise until you can discuss the trouble with your clinician. STRENGTH - Dorsiflexors  Secure a rubber exercise band/tubing to a fixed object (i.e., table, pole) and loop the other end around your right / left foot.  Sit on the floor facing the fixed object. The band/tubing should be slightly tense when your foot is relaxed.  Slowly draw your foot back toward you using your ankle and toes.  Hold this position for __________ seconds. Slowly release the tension in the band and return your foot to the starting position. Repeat __________ times. Complete this exercise __________ times per day.  STRENGTH - Plantar-flexors   Sit with your right / left leg extended. Holding onto both ends of a rubber exercise band/tubing, loop it around the ball of your foot. Keep a slight tension in the band.  Slowly push your toes away from you, pointing them downward.  Hold this position for __________ seconds. Return slowly, controlling the tension in the band/tubing. Repeat __________ times. Complete this exercise __________ times per day.  STRENGTH - Ankle Eversion  Secure one end of a rubber exercise band/tubing to a fixed object (table, pole). Loop the other end around your foot just before your toes.  Place your fists between your knees. This will focus your strengthening at your ankle.  Drawing the band/tubing across your opposite foot, slowly, pull your little toe out and up. Make sure the band/tubing is positioned to resist the entire motion.  Hold this position for __________ seconds. Have  your muscles resist the band/tubing as it slowly pulls your foot back to the starting position.  Repeat __________ times. Complete this exercise __________ times per day.  STRENGTH - Ankle Inversion  Secure one end of a rubber exercise band/tubing to a fixed object (table, pole). Loop the other end around your foot just before your toes.  Place your fists between your knees. This will focus your strengthening at your ankle.  Slowly, pull your big toe up and in, making sure the band/tubing is positioned to resist the entire motion.  Hold this position for __________ seconds.  Have your muscles resist the band/tubing as it slowly pulls your foot back to the starting position. Repeat __________ times. Complete this exercises __________ times per day.  STRENGTH - Towel Curls  Sit in a chair positioned on a non-carpeted surface.  Place your right / left foot on a towel, keeping your heel on the floor.  Pull the towel toward your heel by only curling your toes. Keep your heel on the floor.  If instructed by your physician, physical therapist,   or athletic trainer, add weight to the end of the towel. Repeat __________ times. Complete this exercise __________ times per day. Document Released: 10/18/2004 Document Revised: 08/03/2013 Document Reviewed: 07/01/2008 [facility] Patient Information 2015 [place], [place]. This information is not intended to replace advice given to you by your health care provider. Make sure you discuss any questions you have with your health care provider. Diabetes and Foot Care Diabetes may cause you to have problems because of poor blood supply (circulation) to your feet and legs. This may cause the skin on your feet to become thinner, break easier, and heal more slowly. Your skin may become dry, and the skin may peel and crack. You may also have nerve damage in your legs and feet causing decreased feeling in them. You may not notice minor injuries to your feet that could  lead to infections or more serious problems. Taking care of your feet is one of the most important things you can do for yourself.  HOME CARE INSTRUCTIONS  Wear shoes at all times, even in the house. Do not go barefoot. Bare feet are easily injured.  Check your feet daily for blisters, cuts, and redness. If you cannot see the bottom of your feet, use a mirror or ask someone for help.  Wash your feet with warm water (do not use hot water) and mild soap. Then pat your feet and the areas between your toes until they are completely dry. Do not soak your feet as this can dry your skin.  Apply a moisturizing lotion or petroleum jelly (that does not contain alcohol and is unscented) to the skin on your feet and to dry, brittle toenails. Do not apply lotion between your toes.  Trim your toenails straight across. Do not dig under them or around the cuticle. File the edges of your nails with an emery board or nail file.  Do not cut corns or calluses or try to remove them with medicine.  Wear clean socks or stockings every day. Make sure they are not too tight. Do not wear knee-high stockings since they may decrease blood flow to your legs.  Wear shoes that fit properly and have enough cushioning. To break in new shoes, wear them for just a few hours a day. This prevents you from injuring your feet. Always look in your shoes before you put them on to be sure there are no objects inside.  Do not cross your legs. This may decrease the blood flow to your feet.  If you find a minor scrape, cut, or break in the skin on your feet, keep it and the skin around it clean and dry. These areas may be cleansed with mild soap and water. Do not cleanse the area with peroxide, alcohol, or iodine.  When you remove an adhesive bandage, be sure not to damage the skin around it.  If you have a wound, look at it several times a day to make sure it is healing.  Do not use heating pads or hot water bottles. They may burn  your skin. If you have lost feeling in your feet or legs, you may not know it is happening until it is too late.  Make sure your health care provider performs a complete foot exam at least annually or more often if you have foot problems. Report any cuts, sores, or bruises to your health care provider immediately. SEEK MEDICAL CARE IF:   You have an injury that is not healing.  You have cuts  or breaks in the skin.  You have an ingrown nail.  You notice redness on your legs or feet.  You feel burning or tingling in your legs or feet.  You have pain or cramps in your legs and feet.  Your legs or feet are numb.  Your feet always feel cold. SEEK IMMEDIATE MEDICAL CARE IF:   There is increasing redness, swelling, or pain in or around a wound.  There is a red line that goes up your leg.  Pus is coming from a wound.  You develop a fever or as directed by your health care provider.  You notice a bad smell coming from an ulcer or wound. Document Released: 03/16/2000 Document Revised: 11/19/2012 Document Reviewed: 08/26/2012 [facility] Patient Information 2015 [place], [place]. This information is not intended to replace advice given to you by your health care provider. Make sure you discuss any questions you have with your health care provider. Bunionectomy A bunionectomy is surgery to remove a bunion. A bunion is an enlargement of the joint at the base of the big toe. It is made up of bone and soft tissue on the inside part of the joint. Over time, a painful lump appears on the inside of the joint. The big toe begins to point inward toward the second toe. New bone growth can occur and a bone spur may form. The pain eventually causes difficulty walking. A bunion usually results from inflammation caused by the irritation of poorly fitting shoes. It often begins later in life. A bunionectomy is performed when nonsurgical treatment no longer works. When surgery is needed, the extent of the  procedure will depend on the degree of deformity of the foot. Your surgeon will discuss with you the different procedures and what will work best for you depending on your age and health. LET YOUR CAREGIVER KNOW ABOUT:   Previous problems with anesthetics or medicines used to numb the skin.  Allergies to dyes, iodine, foods, and/or latex.  Medicines taken including herbs, eye drops, prescription medicines (especially medicines used to "thin the blood"), aspirin and other over-the-counter medicines, and steroids (by mouth or as a cream).  History of bleeding or blood problems.  Possibility of pregnancy, if this applies.  History of blood clots in your legs and/or lungs .  Previous surgery.  Other important health problems. RISKS AND COMPLICATIONS   Infection.  Pain.  Nerve damage.  Possibility that the bunion will recur. BEFORE THE PROCEDURE  You should be present 60 minutes prior to your procedure or as directed.  PROCEDURE  Surgery is often done so that you can go home the same day (outpatient). It may be done in a hospital or in an outpatient surgical center. An anesthetic will be used to help you sleep during the procedure. Sometimes, a spinal anesthetic is used to make you numb below the waist. A cut (incision) is made over the swollen area at the first joint of the big toe. The enlarged lump will be removed. If there is a need to reposition the bones of the big toe, this may require more than 1 incision. The bone itself may need to be cut. Screws and wires may be used in the repair. These can be removed at a later date. In severe cases, the entire joint may need to be removed and a joint replacement inserted. When done, the incision is closed with stitches (sutures). Skin adhesive strips may be added for reinforcement. They help hold the incision closed.  AFTER  THE PROCEDURE  Compression bandages (dressings) are then wrapped around the wound. This helps to keep the foot in  alignment and reduce swelling. Your foot will be monitored for bleeding and swelling. You will need to stay for a few hours in the recovery area before being discharged. This allows time for the anesthesia to wear off. You will be discharged home when you are awake, stable, and doing well. HOME CARE INSTRUCTIONS   You can expect to return to normal activities within 6 to 8 weeks after surgery. The foot is at increased risk for swelling for several months. When you can expect to bear weight on the operated foot will depend on the extent of your surgery. The milder the deformity, the less tissue is removed and the sooner the return to normal activity level. During the recovery period, a special shoe, boot, or cast may be worn to accommodate the surgical bandage and to help provide stability to the foot.  Once you are home, an ice pack applied to the operative site may help with discomfort and keep swelling down. Stop using the ice if it causes discomfort.  Keep your feet raised (elevated) when possible to lessen swelling.  If you have an elastic bandage on your foot and you have numbness, tingling, or your foot becomes cold and blue, adjust the bandage to make it comfortable.  Change dressings as directed.  Keep the wound dry and clean. The wound may be washed gently with soap and water. Gently blot dry without rubbing. Do not take baths or use swimming pools or hot tubs for 10 days, or as instructed by your caregiver.  Only take over-the-counter or prescription medicines for pain, discomfort, or fever as directed by your caregiver.  You may continue a normal diet as directed.  For activity, use crutches with no weight bearing or your orthopedic shoe as directed. Continue to use crutches or a cane as directed until you can stand without causing pain. SEEK MEDICAL CARE IF:   You have redness, swelling, bruising, or increasing pain in the wound.  There is pus coming from the wound.  You have  drainage from a wound lasting longer than 1 day.  You have an oral temperature above 102 F (38.9 C).  You notice a bad smell coming from the wound or dressing.  The wound breaks open after sutures have been removed.  You develop dizzy episodes or fainting while standing.  You have persistent nausea or vomiting.  Your toes become cold.  Pain is not relieved with medicines. SEEK IMMEDIATE MEDICAL CARE IF:   You develop a rash.  You have difficulty breathing.  You develop any reaction or side effects to medicines given.  Your toes are numb or blue, or you have severe pain. MAKE SURE YOU:   Understand these instructions.  Will watch your condition.  Will get help right away if you are not doing well or get worse. Document Released: 03/02/2005 Document Revised: 06/11/2011 Document Reviewed: 04/07/2007 [facility] Patient Information 2015 [place], [place]. This information is not intended to replace advice given to you by your health care provider. Make sure you discuss any questions you have with your health care provider.

## 2013-11-27 NOTE — Progress Notes (Signed)
   Subjective:    Patient ID: April Ward, female    DOB: 1961/03/09, 53 y.o.   MRN: [id number]  HPI Comments: "I made this appointment because my insurance said I needed my feet checked since I am diabetic, but since the appointment was made I have sprained my ankle."  Patient c/o aching medial ankle right for 1 week. She fell and twisted her ankle last Friday. Initially, the ankle was swollen and some bruising. She iced over night and was in so much pain Saturday she went to ER. Xrayed-said no fracture. Gave pain meds and ankle brace. States the ankle is "much better" than it was over the weekend. Pain is intermittent.   Also, she needs a diabetic foot exam per recommended by insurance company.  No history of ulceration. No tingling/numbness. No claudication symptoms. Blood sugar 100 at last check.   Diabetes      Review of Systems  HENT: Positive for sinus pressure.   All other systems reviewed and are negative.      Objective:   Physical Exam AAO x3, NAD DP/PT palpable b/l 2/4, CRT < 3sec Protective sensation intact, vibratory sensation intact, achilles tendon reflex intact.  Mild tenderness over the medial aspect of the right ankle. No pinpoint tenderness. No ATFL, CFL, PTFL, or syndesmosis pain. No proximal fibula/tibia or foot pain. No pain with ROM. Mild edema over medial ankle.  HAV/Hammertoe deformities  No open lesions, no preulcerative lesions, nails slightly elongated.  No leg pain/swelling/warmth.      Assessment & Plan:  53 year old female presents for diabetic risk assessment and R ankle sprain.  -Treatment options discussed with the patient in detail including all alternatives, risks, complications. -X-Rays from urgent care reviewed. No acute fracture. There is evidence of old injury to the medial ankle. Posterior and plantar heel spurring.  -Continue with the ankle brace as needed. -Ice -Stretching/rehab exercises given to the patient to preform as  tolerated.  -From a diabetic risk assessment perspective, patient is doing well. -Follow up yearly for diabetic risk assessment or sooner if there are any changes or problems -For the ankle sprain, follow up in 1 month -Call with any questions/concerns or change in symptoms.

## 2013-12-02 ENCOUNTER — Encounter (HOSPITAL_COMMUNITY): Payer: Self-pay | Admitting: *Deleted

## 2013-12-02 ENCOUNTER — Encounter (HOSPITAL_COMMUNITY): Payer: Self-pay | Admitting: Psychiatry

## 2013-12-02 ENCOUNTER — Ambulatory Visit (INDEPENDENT_AMBULATORY_CARE_PROVIDER_SITE_OTHER): Payer: BC Managed Care – PPO | Admitting: Psychiatry

## 2013-12-02 VITALS — BP 176/79 | HR 60 | Ht 71.5 in | Wt 304.4 lb

## 2013-12-02 DIAGNOSIS — F3160 Bipolar disorder, current episode mixed, unspecified: Secondary | ICD-10-CM

## 2013-12-02 DIAGNOSIS — F411 Generalized anxiety disorder: Secondary | ICD-10-CM

## 2013-12-02 DIAGNOSIS — F332 Major depressive disorder, recurrent severe without psychotic features: Secondary | ICD-10-CM

## 2013-12-02 MED ORDER — PRAZOSIN HCL 5 MG PO CAPS: 5.0000 mg | ORAL_CAPSULE | Freq: Every day | ORAL | Status: DC

## 2013-12-02 NOTE — Progress Notes (Signed)
Patient ID: [name], female   DOB: [birth year], 53 y.o.   MRN: [id number] Patient ID: [name], female   DOB: 12/02/1960, 53 y.o.   MRN: [id number] Patient ID: [name], female   DOB: 1960/04/20, 53 y.o.   MRN: [id number]  Psychiatric Assessment Adult  Patient Identification:  April Ward Date of Evaluation:  12/02/2013 Chief Complaint: I've been very stressed at work History of Chief Complaint:   Chief Complaint  Patient presents with  . Anxiety  . Depression  . Follow-up    Anxiety Symptoms include decreased concentration, nervous/anxious behavior and suicidal ideas.     this patient is a 53 year old married black female who lives with her husband, and 53 year old son and 46 year-old nephew  In [place]. 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  The patient was referred by April Ward therapist in our office. The patient states that she had a cyst removed in her left kidney in December. After she had the surgery initially she was told everything was okay. She later found that 60% of her kidney was removed and that she had renal cancer. She's very upset because of the conflicting information. She also developed severe anemia and probably will have to on iron infusion. She now doesn't trust the doctors because she doesn't know what to believe. She is scheduled to see an oncologist to make sure she doesn't need any more treatment for cancer. Her nephrologist so has released her and states she needs to come back in 6 months.  The patient travels 3 hours to her job and stays there in an apartment. She's been doing this since June. Now however she doesn't feel able to function. She's been very depressed since the surgery and the news about the cancer. She's been crying all the time. Her thoughts are racing and she is unable to sleep. She's very tired and doesn't have any appetite. She's had passive suicidal ideation but  no specific plan. She doesn't want to be around people because she gets so upset.  The patient had a depressive episode in May. At that time her sisters were upsetting her. Her mother died in Jul 18, 2011 and left her property to the patient and her 2 sisters. 2 sisters are not good about paying bills and the light bill is in her name. Her primary doctor had put her on  Vibryd which helped but her insurance did not cover it so she is no longer taking it. She's never been on any other antidepressants or had previous psychiatric treatment  The patient returns after one month. She's still having significant problems with her blood pressure and her labetalol has been increased. Her mood is been up and down. She's waking up several times at night and sometimes has severe panic attacks. I told her we could try increasing the Minipress at bedtime. She still does not feel like she could return to work because of out of it makes her feel extremely anxious and angry. She's trying to do more walking but she has not yet restarted her counseling here and we need to do this today Review of Systems  Constitutional: Positive for appetite change and fatigue.  Psychiatric/Behavioral: Positive for suicidal ideas, sleep disturbance, dysphoric mood and decreased concentration. The patient is nervous/anxious.    Physical Exam not done Depressive Symptoms: depressed mood, anhedonia, insomnia, psychomotor retardation, fatigue, feelings of worthlessness/guilt, hopelessness, suicidal thoughts without plan, anxiety,  (  Hypo) Manic Symptoms:   Elevated Mood:  No Irritable Mood:  No Grandiosity:  No Distractibility:  No Labiality of Mood:  No Delusions:  No Hallucinations:  No Impulsivity:  No Sexually Inappropriate Behavior:  No Financial Extravagance:  No Flight of Ideas:  No  Anxiety Symptoms: Excessive Worry:  Yes Panic Symptoms:  Yes Agoraphobia:  Yes Obsessive Compulsive: No  Symptoms: None, Specific Phobias:   No Social Anxiety:  Yes  Psychotic Symptoms:  Hallucinations: No None Delusions:  No Paranoia:  No   Ideas of Reference:  No  PTSD Symptoms: Ever had a traumatic exposure:  No Had a traumatic exposure in the last month:  No Re-experiencing: No None Hypervigilance:  No Hyperarousal: No None Avoidance: No None  Traumatic Brain Injury: No   Past Psychiatric History: Diagnosis: Maj. depression   Hospitalizations: None   Outpatient Care: None   Substance Abuse Care: None   Self-Mutilation: None   Suicidal Attempts: None   Violent Behaviors: None    Past Medical History:   Past Medical History  Diagnosis Date  . Hypertension   . Diabetes mellitus without complication   . Heart murmur   . Depression   . Anxiety   . GERD (gastroesophageal reflux disease)   . Arthritis   . Renal disease 10/2012  . Renal cancer   . Renal insufficiency    History of Loss of Consciousness:  No Seizure History:  No Cardiac History:  No Allergies:   Allergies  Allergen Reactions  . Skelaxin [Metaxalone] Hives   Current Medications:  Current Outpatient Prescriptions  Medication Sig Dispense Refill  . ALPRAZolam (XANAX) 1 MG tablet Take 1 tablet (1 mg total) by mouth 3 (three) times daily.  90 tablet  0  . buPROPion (WELLBUTRIN XL) 150 MG 24 hr tablet Take 1 tablet (150 mg total) by mouth every morning.  30 tablet  2  . carbamazepine (TEGRETOL) 200 MG tablet Take one tablet at bedtime for one week, then take two at bedtime  60 tablet  2  . colchicine 0.6 MG tablet 0.6 mg as needed.       . furosemide (LASIX) 40 MG tablet Take 1 tablet (40 mg total) by mouth 2 (two) times daily.      . hydrALAZINE (APRESOLINE) 100 MG tablet Take 1 tablet (100 mg total) by mouth 3 (three) times daily.      Marland Kitchen HYDROcodone-acetaminophen (NORCO/VICODIN) 5-325 MG per tablet Take 1 tablet by mouth every 4 (four) hours as needed.  20 tablet  0  . labetalol (NORMODYNE) 200 MG tablet Take 2 tablets (400 mg total) by  mouth 2 (two) times daily.  270 tablet  6  . linagliptin (TRADJENTA) 5 MG TABS tablet Take 5 mg by mouth daily.      Marland Kitchen losartan (COZAAR) 50 MG tablet Take 50 mg by mouth daily.      Marland Kitchen omeprazole (PRILOSEC) 20 MG capsule Take 20 mg by mouth daily.      . QUEtiapine (SEROQUEL) 50 MG tablet Take 1 tablet (50 mg total) by mouth at bedtime.  30 tablet  2  . ULORIC 40 MG tablet 40 mg daily.       . prazosin (MINIPRESS) 5 MG capsule Take 1 capsule (5 mg total) by mouth at bedtime.  30 capsule  2   No current facility-administered medications for this visit.    Previous Psychotropic Medications:  Medication Dose   Xanax   0.5 mg 3 times a day  Vibryd  40 mg every morning                   Substance Abuse History in the last 12 months: Substance Age of 1st Use Last Use Amount Specific Type  Nicotine      Alcohol      Cannabis      Opiates      Cocaine      Methamphetamines      LSD      Ecstasy      Benzodiazepines      Caffeine      Inhalants      Others:                          Medical Consequences of Substance Abuse:n/a  Legal Consequences of Substance Abuse: n/a  Family Consequences of Substance Abuse: n/a  Blackouts:  No DT's:  No Withdrawal Symptoms:  No None  Social History: Current Place of Residence: [place] of Birth: [place] Family Members: Husband 2 children, one nephew 4 grandchildren Marital Status:  Married Children:   Sons: 1  Daughters: 1 Relationships:  Education:  Dentist Problems/Performance:  Religious Beliefs/Practices: Christian History of Abuse: none Occupational Experiences; 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 Legal History: None Hobbies/Interests: Traveling, visiting with grandchildren  Family History:   Family History  Problem Relation Age of Onset  . Heart attack Mother   . Heart attack Father   . Depression Paternal Aunt   . Alcohol abuse  Maternal Uncle     Mental Status Examination/Evaluation: Objective:  Appearance: Casual and Fairly Groomed  Engineer, water::  Fair  Speech:  Clear and Coherent  Volume:  Normal  Mood:  Anxious and worried   Affect:  Depressed  Thought Process:  Intact  Orientation:  Full (Time, Place, and Person)  Thought Content:  WDL  Suicidal Thoughts:no  Homicidal Thoughts:  Yes without intent or plan   Judgement:  Intact  Insight:  Good  Psychomotor Activity:  Normal  Akathisia:  No  Handed:  Right  AIMS (if indicated):    Assets:  Communication Skills Desire for Improvement    Laboratory/X-Ray Psychological Evaluation(s)       Assessment:  Axis I: Generalized Anxiety Disorder and Major Depression, Recurrent severe  AXIS I Generalized Anxiety Disorder and Major Depression, Recurrent severe  AXIS II Deferred  AXIS III Past Medical History  Diagnosis Date  . Hypertension   . Diabetes mellitus without complication   . Heart murmur   . Depression   . Anxiety   . GERD (gastroesophageal reflux disease)   . Arthritis   . Renal disease 10/2012  . Renal cancer   . Renal insufficiency      AXIS IV other psychosocial or environmental problems  AXIS V 51-60 moderate symptoms   Treatment Plan/Recommendations:  Plan of Care: Medication management   Laboratory:   Psychotherapy: The patient is seeing April Ward here and will reschedule with her   Medications: She'll continue her current medications but increase prazosin to 5 mg each bedtime   Routine PRN Medications:  No  Consultations:   Safety Concerns:  She contracts for safety   Other:    return in 4 weeks. She's not stable enough to return to employment     [name], MD 9/2/20159:34 AM

## 2013-12-14 ENCOUNTER — Encounter: Payer: Self-pay | Admitting: Orthopedic Surgery

## 2013-12-14 ENCOUNTER — Ambulatory Visit (INDEPENDENT_AMBULATORY_CARE_PROVIDER_SITE_OTHER): Payer: BC Managed Care – PPO | Admitting: Orthopedic Surgery

## 2013-12-14 ENCOUNTER — Ambulatory Visit (INDEPENDENT_AMBULATORY_CARE_PROVIDER_SITE_OTHER): Payer: BC Managed Care – PPO

## 2013-12-14 VITALS — BP 174/80 | Ht 71.5 in | Wt 304.0 lb

## 2013-12-14 DIAGNOSIS — M25571 Pain in right ankle and joints of right foot: Secondary | ICD-10-CM

## 2013-12-14 DIAGNOSIS — M25579 Pain in unspecified ankle and joints of unspecified foot: Secondary | ICD-10-CM

## 2013-12-14 NOTE — Patient Instructions (Signed)
CAM WALKER FOR FOUR WEEKS

## 2013-12-14 NOTE — Progress Notes (Signed)
Established patient new problem  Family physician Dr. Berdine Addison  Pharmacy [facility] in [place]  Chief complaint pain right foot  The patient said she sprained her ankle August 21 when she stepped on something and twisted it but her pain is on the medial side of her foot. X-ray was done in the ER the ankle and she was given an ankle brace but she's not having any ankle pain  Current symptoms pain and swelling Pain is described as aching Treatment includes pain medication the x-rays I described in the ASO brace which she wore for 2 or 3 weeks. Her pain is improved with ice and worsened with walking  Past Medical History  Diagnosis Date  . Hypertension   . Diabetes mellitus without complication   . Heart murmur   . Depression   . Anxiety   . GERD (gastroesophageal reflux disease)   . Arthritis   . Renal disease 10/2012  . Renal cancer   . Renal insufficiency    Past Surgical History  Procedure Laterality Date  . Cesarean section  [id number]  . Knee arthroscopy with medial menisectomy Right 08/22/2012    Procedure: KNEE ARTHROSCOPY WITH PARTIAL MEDIAL MENISECTOMY;  Surgeon: Carole Civil, MD;  Location: [facility];  Service: Orthopedics;  Laterality: Right;  . Chondroplasty Right 08/22/2012    Procedure: CHONDROPLASTY;  Surgeon: Carole Civil, MD;  Location: [facility];  Service: Orthopedics;  Laterality: Right;  . 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 Ambulated in today with no assistive device mood and affect normal orientation x3 normal general appearance normal vitals as recorded  Left foot:  Deferred   Imaging: X-ray of the right foot: Multiple areas of degenerative change are noted in the midfoot no acute fracture is noted.    Assessment:    Non-specific foot pain    Plan:    Angulated short Cam Walker x4 weeks

## 2013-12-17 ENCOUNTER — Encounter: Payer: Self-pay | Admitting: Gastroenterology

## 2013-12-17 ENCOUNTER — Ambulatory Visit (INDEPENDENT_AMBULATORY_CARE_PROVIDER_SITE_OTHER): Payer: Self-pay | Admitting: Gastroenterology

## 2013-12-17 VITALS — BP 168/82 | HR 62 | Temp 98.1°F | Ht 71.5 in | Wt 297.6 lb

## 2013-12-17 DIAGNOSIS — Z1211 Encounter for screening for malignant neoplasm of colon: Secondary | ICD-10-CM | POA: Insufficient documentation

## 2013-12-17 MED ORDER — PEG-KCL-NACL-NASULF-NA ASC-C 100 G PO SOLR: 1.0000 | ORAL | Status: DC

## 2013-12-17 NOTE — Assessment & Plan Note (Signed)
53 year old female presenting with need for initial screening colonoscopy. No concerning lower or upper GI symptoms. Due to polypharmacy, will need to proceed with Propofol. I have asked that she make sure she take her BP medications the day of the procedure. Hold Tradjenta day of procedure.   Proceed with TCS with Dr. Oneida Alar in near future: the risks, benefits, and alternatives have been discussed with the patient in detail. The patient states understanding and desires to proceed.

## 2013-12-17 NOTE — Patient Instructions (Signed)
We have scheduled you for a colonoscopy with Dr. Fields in the near future.  Further recommendations to follow!   

## 2013-12-17 NOTE — Addendum Note (Signed)
Addended by: Idamae Schuller on: 12/17/2013 10:08 AM   Modules accepted: Orders

## 2013-12-17 NOTE — Progress Notes (Signed)
Primary Care Physician:  Maggie Font, MD Primary Gastroenterologist:  Dr. Oneida Alar   Chief Complaint  Patient presents with  . Colonoscopy    HPI:   April Ward presents today for an office visit prior to initial screening colonoscopy. Quite nervous. No abdominal pain. Intermittent constipation. Will take a protein shake with good results. No rectal bleeding. Omeprazole for GERD, no dysphagia, no N/V. No unintentional weight loss or lack of appetite. Husband had a colonoscopy with Dr. Oneida Alar as well.   Past Medical History  Diagnosis Date  . Hypertension   . Diabetes mellitus without complication   . Heart murmur   . Depression   . Anxiety   . GERD (gastroesophageal reflux disease)   . Arthritis   . Renal disease 10/2012  . Renal cancer   . Renal insufficiency   . Gout     Past Surgical History  Procedure Laterality Date  . Cesarean section  [id number]  . Knee arthroscopy with medial menisectomy Right 08/22/2012    Procedure: KNEE ARTHROSCOPY WITH PARTIAL MEDIAL MENISECTOMY;  Surgeon: Carole Civil, MD;  Location: [facility];  Service: Orthopedics;  Laterality: Right;  . Chondroplasty Right 08/22/2012    Procedure: CHONDROPLASTY;  Surgeon: Carole Civil, MD;  Location: [facility];  Service: Orthopedics;  Laterality: Right;  . Partial nephrectomy  Dec 2014    left    Current Outpatient Prescriptions  Medication Sig Dispense Refill  . ALPRAZolam (XANAX) 1 MG tablet Take 1 tablet (1 mg total) by mouth 3 (three) times daily.  90 tablet  0  . buPROPion (WELLBUTRIN XL) 150 MG 24 hr tablet Take 1 tablet (150 mg total) by mouth every morning.  30 tablet  2  . carbamazepine (TEGRETOL) 200 MG tablet Take one tablet at bedtime for one week, then take two at bedtime  60 tablet  2  . colchicine 0.6 MG tablet 0.6 mg as needed.       . furosemide (LASIX) 40 MG tablet Take 1 tablet (40 mg total) by mouth 2 (two) times daily.      . hydrALAZINE (APRESOLINE) 100 MG tablet Take 1  tablet (100 mg total) by mouth 3 (three) times daily.      Marland Kitchen labetalol (NORMODYNE) 200 MG tablet Take 2 tablets (400 mg total) by mouth 2 (two) times daily.  270 tablet  6  . linagliptin (TRADJENTA) 5 MG TABS tablet Take 5 mg by mouth daily.      Marland Kitchen losartan (COZAAR) 50 MG tablet Take 50 mg by mouth daily.      Marland Kitchen omeprazole (PRILOSEC) 20 MG capsule Take 20 mg by mouth daily.      . prazosin (MINIPRESS) 5 MG capsule Take 1 capsule (5 mg total) by mouth at bedtime.  30 capsule  2  . QUEtiapine (SEROQUEL) 50 MG tablet Take 1 tablet (50 mg total) by mouth at bedtime.  30 tablet  2  . ULORIC 40 MG tablet 40 mg daily.        No current facility-administered medications for this visit.    Allergies as of 12/17/2013 - Review Complete 12/17/2013  Allergen Reaction Noted  . Skelaxin [metaxalone] Hives and Rash 07/13/2012    Family History  Problem Relation Age of Onset  . Heart attack Mother   . Heart attack Father   . Depression Paternal Aunt   . Alcohol abuse Maternal Uncle   . Colon cancer Maternal Aunt   . Colon cancer Maternal  Uncle     History   Social History  . Marital Status: Married    Spouse Name: N/A    Number of Children: N/A  . Years of Education: N/A   Occupational History  . [employer]    Social History Main Topics  . Smoking status: Never Smoker   . Smokeless tobacco: Never Used  . Alcohol Use: No  . Drug Use: No  . Sexual Activity: Not on file   Other Topics Concern  . Not on file   Social History Narrative  . No narrative on file    Review of Systems: Gen: Denies any fever, chills, fatigue, weight loss, lack of appetite.  CV: Denies chest pain, heart palpitations, peripheral edema, syncope.  Resp: Denies shortness of breath at rest or with exertion. Denies wheezing or cough.  GI: see HPI GU : Denies urinary burning, urinary frequency, urinary hesitancy MS: Denies joint pain, muscle weakness, cramps, or limitation of movement.  Derm: Denies  rash, itching, dry skin Psych: depression/anxiety at baseline Heme: Denies bruising, bleeding, and enlarged lymph nodes.  Physical Exam: BP 168/82  Pulse 62  Temp(Src) 98.1 F (36.7 C) (Oral)  Ht 5' 11.5" (1.816 m)  Wt 297 lb 9.6 oz (134.99 kg)  BMI 40.93 kg/m2 General:   Alert and oriented. Pleasant and cooperative. Well-nourished and well-developed.  Head:  Normocephalic and atraumatic. Eyes:  Without icterus, sclera clear and conjunctiva pink.  Ears:  Normal auditory acuity. Nose:  No deformity, discharge,  or lesions. Mouth:  No deformity or lesions, oral mucosa pink.  Lungs:  Clear to auscultation bilaterally. No wheezes, rales, or rhonchi. No distress.  Heart:  S1, S2 present without murmurs appreciated.  Abdomen:  +BS, soft, non-tender and non-distended. No HSM noted. No guarding or rebound. No masses appreciated.  Rectal:  Deferred  Msk:  Symmetrical without gross deformities. Normal posture. Extremities:  Without  edema. Neurologic:  Alert and  oriented x4;  grossly normal neurologically. Skin:  Intact without significant lesions or rashes. Psych:  Alert and cooperative. Normal mood and affect.

## 2013-12-18 ENCOUNTER — Ambulatory Visit: Payer: BC Managed Care – PPO | Admitting: Podiatry

## 2013-12-22 NOTE — Progress Notes (Signed)
CC'ED TO PCP 

## 2013-12-22 NOTE — Patient Instructions (Signed)
65    Your procedure is scheduled on: 12/29/2013  Report to [facility] at  7:15   AM.  Call this number if you have problems the morning of surgery: [phone]   Remember:   Do not drink or eat food:After Midnight.    Clear liquids include soda, tea, black coffee, apple or grape juice, broth.  Take these medicines the morning of surgery with A SIP OF WATER: Xanax, Welbutrin, Tegretol, hydralazine, Cozaar, Omeprazole and Seroquel   Do not wear jewelry, make-up or nail polish.  Do not wear lotions, powders, or perfumes. You may wear deodorant.  Do not shave 48 hours prior to surgery. Men may shave face and neck.  Do not bring valuables to the hospital.  Contacts, dentures or bridgework may not be worn into surgery.  Leave suitcase in the car. After surgery it may be brought to your room.  For patients admitted to the hospital, checkout time is 11:00 AM the day of discharge.   Patients discharged the day of surgery will not be allowed to drive home.  Name and phone number of your driver:    Please read over the following fact sheets that you were given: Pain Booklet, Lab Information and Anesthesia Post-op Instructions  Colonoscopy, Care After Refer to this sheet in the next few weeks. These instructions provide you with information on caring for yourself after your procedure. Your health care provider may also give you more specific instructions. Your treatment has been planned according to current medical practices, but problems sometimes occur. Call your health care provider if you have any problems or questions after your procedure. WHAT TO EXPECT AFTER THE PROCEDURE  After your procedure, it is typical to have the following:  A small amount of blood in your stool.  Moderate amounts of gas and mild abdominal cramping or bloating. HOME CARE INSTRUCTIONS  Do not drive, operate machinery, or sign important documents for 24 hours.  You may shower and resume your regular physical  activities, but move at a slower pace for the first 24 hours.  Take frequent rest periods for the first 24 hours.  Walk around or put a warm pack on your abdomen to help reduce abdominal cramping and bloating.  Drink enough fluids to keep your urine clear or pale yellow.  You may resume your normal diet as instructed by your health care provider. Avoid heavy or fried foods that are hard to digest.  Avoid drinking alcohol for 24 hours or as instructed by your health care provider.  Only take over-the-counter or prescription medicines as directed by your health care provider.  If a tissue sample (biopsy) was taken during your procedure:  Do not take aspirin or blood thinners for 7 days, or as instructed by your health care provider.  Do not drink alcohol for 7 days, or as instructed by your health care provider.  Eat soft foods for the first 24 hours. SEEK MEDICAL CARE IF: You have persistent spotting of blood in your stool 2-3 days after the procedure. SEEK IMMEDIATE MEDICAL CARE IF:  You have more than a small spotting of blood in your stool.  You pass large blood clots in your stool.  Your abdomen is swollen (distended).  You have nausea or vomiting.  You have a fever.  You have increasing abdominal pain that is not relieved with medicine. Document Released: 11/01/2003 Document Revised: 01/07/2013 Document Reviewed: 11/24/2012 [facility] Patient Information 2015 [place], [place]. This information is not intended to replace advice  given to you by your health care provider. Make sure you discuss any questions you have with your health care provider. ° °

## 2013-12-23 ENCOUNTER — Ambulatory Visit (HOSPITAL_COMMUNITY): Payer: Self-pay | Admitting: Psychiatry

## 2013-12-23 ENCOUNTER — Telehealth: Payer: Self-pay | Admitting: Gastroenterology

## 2013-12-23 ENCOUNTER — Encounter (HOSPITAL_COMMUNITY)
Admission: RE | Admit: 2013-12-23 | Discharge: 2013-12-23 | Disposition: A | Discharge status: Home / Self Care | Payer: BC Managed Care – PPO | Source: Ambulatory Visit | Attending: Gastroenterology | Admitting: Gastroenterology

## 2013-12-23 NOTE — Telephone Encounter (Signed)
Short Stay called Maudie Mercury) to let us know that patient has not shown up for her pre op visit and is unable to reach her by phone. I checked to see if there was another number for her to try. Nothing available other than her emergency contacts but she didn't want to take those due to HIPA.  She said if patient shows up she would let us know. She is scheduled for 9/29 with [facility]

## 2013-12-24 ENCOUNTER — Telehealth: Payer: Self-pay | Admitting: Gastroenterology

## 2013-12-24 NOTE — Telephone Encounter (Signed)
Pt called to reschedule her tcs in the OR that's scheduled for Tuesday next week with [facility]. Please call her back at [phone]

## 2013-12-24 NOTE — Telephone Encounter (Signed)
Noted  

## 2013-12-24 NOTE — Telephone Encounter (Signed)
Pt has question about her prep. Her kidney doctor said for her to ask since she only has one kidney. Please advise

## 2013-12-25 ENCOUNTER — Encounter (HOSPITAL_COMMUNITY): Payer: Self-pay | Admitting: Pharmacy Technician

## 2013-12-28 ENCOUNTER — Telehealth: Payer: Self-pay

## 2013-12-28 NOTE — Telephone Encounter (Signed)
Noted  

## 2013-12-28 NOTE — Telephone Encounter (Signed)
Jamise from [facility] call this morning because the patients insurance is no long active. I told her that we would call the patient to see if she has another insurance and let her know. Her call back number is [phone] ext: [phone].

## 2013-12-28 NOTE — Telephone Encounter (Signed)
Open in error

## 2013-12-28 NOTE — Progress Notes (Signed)
REVIEWED-NO ADDITIONAL RECOMMENDATIONS. 

## 2013-12-28 NOTE — Telephone Encounter (Signed)
Pt called this morning to make sure that she had been taking off the schedule for her TCS on Tuesday. I advise her that she had been taking off. She would like to clearances from the kidney doctor first before she has this done.

## 2013-12-29 ENCOUNTER — Ambulatory Visit (HOSPITAL_COMMUNITY)
Admission: RE | Admit: 2013-12-29 | Payer: BC Managed Care – PPO | Source: Ambulatory Visit | Admitting: Gastroenterology

## 2013-12-29 ENCOUNTER — Ambulatory Visit (HOSPITAL_COMMUNITY): Payer: Self-pay | Admitting: Psychiatry

## 2013-12-29 ENCOUNTER — Encounter (HOSPITAL_COMMUNITY): Admission: RE | Payer: Self-pay | Source: Ambulatory Visit

## 2013-12-29 ENCOUNTER — Ambulatory Visit: Admit: 2013-12-29 | Payer: Self-pay | Admitting: Gastroenterology

## 2013-12-29 SURGERY — COLONOSCOPY
Anesthesia: Monitor Anesthesia Care

## 2013-12-29 SURGERY — COLONOSCOPY WITH PROPOFOL
Anesthesia: Monitor Anesthesia Care

## 2014-01-01 ENCOUNTER — Encounter (HOSPITAL_COMMUNITY): Payer: Self-pay | Admitting: Psychiatry

## 2014-01-01 ENCOUNTER — Ambulatory Visit (INDEPENDENT_AMBULATORY_CARE_PROVIDER_SITE_OTHER): Payer: BC Managed Care – PPO | Admitting: Psychiatry

## 2014-01-01 VITALS — BP 175/78 | HR 60 | Ht 71.5 in | Wt 296.4 lb

## 2014-01-01 DIAGNOSIS — F322 Major depressive disorder, single episode, severe without psychotic features: Secondary | ICD-10-CM

## 2014-01-01 DIAGNOSIS — F411 Generalized anxiety disorder: Secondary | ICD-10-CM

## 2014-01-01 DIAGNOSIS — F332 Major depressive disorder, recurrent severe without psychotic features: Secondary | ICD-10-CM

## 2014-01-01 MED ORDER — DIAZEPAM 10 MG PO TABS: 10.0000 mg | ORAL_TABLET | Freq: Four times a day (QID) | ORAL | Status: DC

## 2014-01-01 MED ORDER — PRAZOSIN HCL 5 MG PO CAPS: 5.0000 mg | ORAL_CAPSULE | Freq: Every day | ORAL | Status: DC

## 2014-01-01 MED ORDER — BUPROPION HCL ER (XL) 150 MG PO TB24: 150.0000 mg | ORAL_TABLET | ORAL | Status: DC

## 2014-01-01 MED ORDER — CARBAMAZEPINE 200 MG PO TABS: ORAL_TABLET | ORAL | Status: DC

## 2014-01-01 MED ORDER — QUETIAPINE FUMARATE 50 MG PO TABS: 50.0000 mg | ORAL_TABLET | Freq: Every day | ORAL | Status: DC

## 2014-01-01 NOTE — Progress Notes (Signed)
Patient ID: April Ward, female   DOB: 08/19/1960, 53 y.o.   MRN: [id number] Patient ID: April Ward, female   DOB: 08-Jun-1960, 53 y.o.   MRN: [id number] Patient ID: April Ward, female   DOB: 1961/03/26, 53 y.o.   MRN: [id number] Patient ID: April Ward, female   DOB: [birth year], 53 y.o.   MRN: [id number]  Psychiatric Assessment Adult  Patient Identification:  April Ward Date of Evaluation:  01/01/2014 Chief Complaint: My panic attacks are worse History of Chief Complaint:   Chief Complaint  Patient presents with  . Anxiety  . Depression  . Follow-up    Anxiety Symptoms include decreased concentration, nervous/anxious behavior and suicidal ideas.     this patient is a 53 year old married black female who lives with her husband, and 71 year old son and 28 year-old nephew  In [place]. 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 The patient states that she had a cyst removed in her left kidney in December. After she had the surgery initially she was told everything was okay. She later found that 60% of her kidney was removed and that she had renal cancer. She's very upset because of the conflicting information. She also developed severe anemia and probably will have to on iron infusion. She now doesn't trust the doctors because she doesn't know what to believe. She is scheduled to see an oncologist to make sure she doesn't need any more treatment for cancer. Her nephrologist so has released her and states she needs to come back in 6 months.  The patient travels 3 hours to her job and stays there in an apartment. She's been doing this since June. Now however she doesn't feel able to function. She's been very depressed since the surgery and the news about the cancer. She's been crying all the time. Her thoughts are  racing and she is unable to sleep. She's very tired and doesn't have any appetite. She's had passive suicidal ideation but no specific plan. She doesn't want to be around people because she gets so upset.  The patient had a depressive episode in May. At that time her sisters were upsetting her. Her mother died in 2011/05/26 and left her property to the patient and her 2 sisters. 2 sisters are not good about paying bills and the light bill is in her name. Her primary doctor had put her on  Vibryd which helped but her insurance did not cover it so she is no longer taking it. She's never been on any other antidepressants or had previous psychiatric treatment  The patient returns after one month. She's having severe panic attacks that come out of the blue. She feels like she is "going crazy" She denies suicidal ideation or auditory or visual hallucinations. She is fighting with an IT consultant. Wakes up in a cold sweat. Xanax is not working Review of Systems  Constitutional: Positive for appetite change and fatigue.  Psychiatric/Behavioral: Positive for suicidal ideas, sleep disturbance, dysphoric mood and decreased concentration. The patient is nervous/anxious.    Physical Exam not done Depressive Symptoms: depressed mood, anhedonia, insomnia, psychomotor retardation, fatigue, feelings of worthlessness/guilt, hopelessness, suicidal thoughts without plan, anxiety,  (Hypo) Manic Symptoms:   Elevated Mood:  No Irritable Mood:  No Grandiosity:  No Distractibility:  No Labiality of Mood:  No Delusions:  No Hallucinations:  No Impulsivity:  No Sexually Inappropriate Behavior:  No Financial Extravagance:  No Flight of Ideas:  No  Anxiety Symptoms: Excessive Worry:  Yes Panic Symptoms:  Yes Agoraphobia:  Yes Obsessive Compulsive: No  Symptoms: None, Specific Phobias:  No Social Anxiety:  Yes  Psychotic Symptoms:  Hallucinations: No None Delusions:  No Paranoia:  No   Ideas of  Reference:  No  PTSD Symptoms: Ever had a traumatic exposure:  No Had a traumatic exposure in the last month:  No Re-experiencing: No None Hypervigilance:  No Hyperarousal: No None Avoidance: No None  Traumatic Brain Injury: No   Past Psychiatric History: Diagnosis: Maj. depression   Hospitalizations: None   Outpatient Care: None   Substance Abuse Care: None   Self-Mutilation: None   Suicidal Attempts: None   Violent Behaviors: None    Past Medical History:   Past Medical History  Diagnosis Date  . Hypertension   . Diabetes mellitus without complication   . Heart murmur   . Depression   . Anxiety   . GERD (gastroesophageal reflux disease)   . Arthritis   . Renal disease 10/2012  . Renal cancer   . Renal insufficiency   . Gout    History of Loss of Consciousness:  No Seizure History:  No Cardiac History:  No Allergies:   Allergies  Allergen Reactions  . Skelaxin [Metaxalone] Hives and Rash   Current Medications:  Current Outpatient Prescriptions  Medication Sig Dispense Refill  . buPROPion (WELLBUTRIN XL) 150 MG 24 hr tablet Take 1 tablet (150 mg total) by mouth every morning.  30 tablet  2  . carbamazepine (TEGRETOL) 200 MG tablet Take one tablet at bedtime for one week, then take two at bedtime  60 tablet  2  . colchicine 0.6 MG tablet Take 0.6 mg by mouth as needed (gout).       . furosemide (LASIX) 40 MG tablet Take 1 tablet (40 mg total) by mouth 2 (two) times daily.      . hydrALAZINE (APRESOLINE) 100 MG tablet Take 1 tablet (100 mg total) by mouth 3 (three) times daily.      Marland Kitchen labetalol (NORMODYNE) 200 MG tablet Take 2 tablets (400 mg total) by mouth 2 (two) times daily.  270 tablet  6  . linagliptin (TRADJENTA) 5 MG TABS tablet Take 5 mg by mouth daily.      Marland Kitchen losartan (COZAAR) 50 MG tablet Take 50 mg by mouth daily.      Marland Kitchen omeprazole (PRILOSEC) 20 MG capsule Take 20 mg by mouth daily.      . prazosin (MINIPRESS) 5 MG capsule Take 1 capsule (5 mg total)  by mouth at bedtime.  30 capsule  2  . QUEtiapine (SEROQUEL) 50 MG tablet Take 1 tablet (50 mg total) by mouth at bedtime.  30 tablet  2  . ULORIC 40 MG tablet Take 40 mg by mouth daily.       . diazepam (VALIUM) 10 MG tablet Take 1 tablet (10 mg total) by mouth 4 (four) times daily.  120 tablet  2  . peg 3350 powder (MOVIPREP) 100 G SOLR Take 1 kit (200 g total) by mouth as directed.  1 kit  0   No current facility-administered medications for this visit.    Previous Psychotropic Medications:  Medication Dose   Xanax   0.5 mg 3 times a day   Vibryd  40 mg every morning  Substance Abuse History in the last 12 months: Substance Age of 1st Use Last Use Amount Specific Type  Nicotine      Alcohol      Cannabis      Opiates      Cocaine      Methamphetamines      LSD      Ecstasy      Benzodiazepines      Caffeine      Inhalants      Others:                          Medical Consequences of Substance Abuse:n/a  Legal Consequences of Substance Abuse: n/a  Family Consequences of Substance Abuse: n/a  Blackouts:  No DT's:  No Withdrawal Symptoms:  No None  Social History: Current Place of Residence: [place] of Birth: [place] Family Members: Husband 2 children, one nephew 4 grandchildren Marital Status:  Married Children:   Sons: 1  Daughters: 1 Relationships:  Education:  Dentist Problems/Performance:  Religious Beliefs/Practices: Christian History of Abuse: none Occupational Experiences; 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 Legal History: None Hobbies/Interests: Traveling, visiting with grandchildren  Family History:   Family History  Problem Relation Age of Onset  . Heart attack Mother   . Heart attack Father   . Depression Paternal Aunt   . Alcohol abuse Maternal Uncle   . Colon cancer Maternal Aunt   . Colon cancer Maternal Uncle     Mental Status  Examination/Evaluation: Objective:  Appearance: Casual and Fairly Groomed  Engineer, water::  Fair  Speech:  Clear and Coherent  Volume:  Normal  Mood:  Anxious and worried , tearful  Affect:  Depressed very anxious  Thought Process:  Intact  Orientation:  Full (Time, Place, and Person)  Thought Content:  WDL  Suicidal Thoughts:no  Homicidal Thoughts:  Yes without intent or plan   Judgement:  Intact  Insight:  Good  Psychomotor Activity:  Normal  Akathisia:  No  Handed:  Right  AIMS (if indicated):    Assets:  Communication Skills Desire for Improvement    Laboratory/X-Ray Psychological Evaluation(s)       Assessment:  Axis I: Generalized Anxiety Disorder and Major Depression, Recurrent severe  AXIS I Generalized Anxiety Disorder and Major Depression, Recurrent severe  AXIS II Deferred  AXIS III Past Medical History  Diagnosis Date  . Hypertension   . Diabetes mellitus without complication   . Heart murmur   . Depression   . Anxiety   . GERD (gastroesophageal reflux disease)   . Arthritis   . Renal disease 10/2012  . Renal cancer   . Renal insufficiency   . Gout      AXIS IV other psychosocial or environmental problems  AXIS V 51-60 moderate symptoms   Treatment Plan/Recommendations:  Plan of Care: Medication management   Laboratory:   Psychotherapy: The patient is seeing Maurice Small here and will reschedule with her   Medications: She'll continue her current medications but discontinue xanax and start valium 10 mg qid  Routine PRN Medications:  No  Consultations:   Safety Concerns:  She contracts for safety   Other:    return in 4 weeks. She's not stable enough to return to employment     April Spiller, MD 10/2/201510:10 AM

## 2014-01-06 ENCOUNTER — Telehealth: Payer: Self-pay

## 2014-01-06 NOTE — Telephone Encounter (Signed)
Straight forward. However, needs Propofol. I'm copying Dr. Oneida Alar on this. Let patient know we will get approval from Dr. Oneida Alar to triage when she returns; may ultimately need OV.

## 2014-01-06 NOTE — Telephone Encounter (Signed)
Pt is calling to let us know the her kidney doctor told her she could have the TCS and use the go-lyte prep. Please advise if she will need to come back in for an office visit or if we can just set her up. Please advise

## 2014-01-11 NOTE — Telephone Encounter (Signed)
REVIEWED. TRIAGE PT FOR TCS.

## 2014-01-12 ENCOUNTER — Emergency Department (HOSPITAL_COMMUNITY)
Admission: EM | Admit: 2014-01-12 | Discharge: 2014-01-12 | Disposition: A | Discharge status: Home / Self Care | Payer: BC Managed Care – PPO | Source: Home / Self Care | Attending: Family Medicine | Admitting: Family Medicine

## 2014-01-12 ENCOUNTER — Encounter (HOSPITAL_COMMUNITY): Payer: Self-pay | Admitting: Emergency Medicine

## 2014-01-12 ENCOUNTER — Encounter: Payer: Self-pay | Admitting: Orthopedic Surgery

## 2014-01-12 ENCOUNTER — Ambulatory Visit (INDEPENDENT_AMBULATORY_CARE_PROVIDER_SITE_OTHER): Payer: BC Managed Care – PPO | Admitting: Orthopedic Surgery

## 2014-01-12 VITALS — BP 162/77 | Ht 71.5 in | Wt 296.4 lb

## 2014-01-12 DIAGNOSIS — M25571 Pain in right ankle and joints of right foot: Secondary | ICD-10-CM

## 2014-01-12 DIAGNOSIS — B9789 Other viral agents as the cause of diseases classified elsewhere: Principal | ICD-10-CM

## 2014-01-12 DIAGNOSIS — M76829 Posterior tibial tendinitis, unspecified leg: Secondary | ICD-10-CM

## 2014-01-12 DIAGNOSIS — J069 Acute upper respiratory infection, unspecified: Secondary | ICD-10-CM

## 2014-01-12 DIAGNOSIS — J0100 Acute maxillary sinusitis, unspecified: Secondary | ICD-10-CM

## 2014-01-12 DIAGNOSIS — C642 Malignant neoplasm of left kidney, except renal pelvis: Secondary | ICD-10-CM

## 2014-01-12 DIAGNOSIS — N189 Chronic kidney disease, unspecified: Secondary | ICD-10-CM

## 2014-01-12 DIAGNOSIS — M6789 Other specified disorders of synovium and tendon, multiple sites: Secondary | ICD-10-CM

## 2014-01-12 MED ORDER — FLUCONAZOLE 150 MG PO TABS: 150.0000 mg | ORAL_TABLET | Freq: Every day | ORAL | Status: DC

## 2014-01-12 MED ORDER — IPRATROPIUM BROMIDE 0.06 % NA SOLN: 2.0000 | Freq: Four times a day (QID) | NASAL | Status: DC

## 2014-01-12 MED ORDER — AMOXICILLIN-POT CLAVULANATE 875-125 MG PO TABS: 1.0000 | ORAL_TABLET | Freq: Two times a day (BID) | ORAL | Status: DC

## 2014-01-12 MED ORDER — FLUTICASONE PROPIONATE 50 MCG/ACT NA SUSP: 2.0000 | Freq: Every day | NASAL | Status: DC

## 2014-01-12 NOTE — ED Provider Notes (Signed)
CSN: [id number]     Arrival date & time 01/12/14  1034 History   First MD Initiated Contact with Patient 01/12/14 1101     Chief Complaint  Patient presents with  . URI   (Consider location/radiation/quality/duration/timing/severity/associated sxs/prior Treatment) HPI  URI: started on Sunday w/ runny nose and sneezing and coughing. Denies fevers, chills, nausea, vomiting, sore throat. gettign worse. Worse at night. Allegra w/o benefit. Pt has to be careful w/ what she tries due to kidneys. H/o intermittent seasonal allergies.    Past Medical History  Diagnosis Date  . Hypertension   . Diabetes mellitus without complication   . Heart murmur   . Depression   . Anxiety   . GERD (gastroesophageal reflux disease)   . Arthritis   . Renal disease 10/2012  . Renal insufficiency   . Gout   . Renal cancer    Past Surgical History  Procedure Laterality Date  . Cesarean section  [id number]  . Knee arthroscopy with medial menisectomy Right 08/22/2012    Procedure: KNEE ARTHROSCOPY WITH PARTIAL MEDIAL MENISECTOMY;  Surgeon: Carole Civil, MD;  Location: [facility];  Service: Orthopedics;  Laterality: Right;  . Chondroplasty Right 08/22/2012    Procedure: CHONDROPLASTY;  Surgeon: Carole Civil, MD;  Location: [facility];  Service: Orthopedics;  Laterality: Right;  . Partial nephrectomy  Dec 2014    left   Family History  Problem Relation Age of Onset  . Heart attack Mother   . Heart attack Father   . Depression Paternal Aunt   . Alcohol abuse Maternal Uncle   . Colon cancer Maternal Aunt   . Colon cancer Maternal Uncle    History  Substance Use Topics  . Smoking status: Never Smoker   . Smokeless tobacco: Never Used  . Alcohol Use: No   OB History   Grav Para Term Preterm Abortions TAB SAB Ect Mult Living                 Review of Systems  Allergies  Skelaxin  Home Medications   Prior to Admission medications   Medication Sig Start Date End Date Taking? Authorizing  Provider  buPROPion (WELLBUTRIN XL) 150 MG 24 hr tablet Take 1 tablet (150 mg total) by mouth every morning. 01/01/14 01/01/15 Yes [name], MD  carbamazepine (TEGRETOL) 200 MG tablet Take one tablet at bedtime for one week, then take two at bedtime 01/01/14  Yes [name], MD  colchicine 0.6 MG tablet Take 0.6 mg by mouth as needed (gout).  10/20/13  Yes Historical Provider, MD  diazepam (VALIUM) 10 MG tablet Take 1 tablet (10 mg total) by mouth 4 (four) times daily. 01/01/14  Yes [name], MD  furosemide (LASIX) 40 MG tablet Take 1 tablet (40 mg total) by mouth 2 (two) times daily. 09/23/13  Yes Arnoldo Lenis, MD  hydrALAZINE (APRESOLINE) 100 MG tablet Take 1 tablet (100 mg total) by mouth 3 (three) times daily. 09/23/13  Yes Arnoldo Lenis, MD  labetalol (NORMODYNE) 200 MG tablet Take 2 tablets (400 mg total) by mouth 2 (two) times daily. 11/18/13  Yes Arnoldo Lenis, MD  linagliptin (TRADJENTA) 5 MG TABS tablet Take 5 mg by mouth daily.   Yes Historical Provider, MD  losartan (COZAAR) 50 MG tablet Take 50 mg by mouth daily.   Yes Historical Provider, MD  omeprazole (PRILOSEC) 20 MG capsule Take 20 mg by mouth daily.   Yes Historical Provider, MD  peg 3350 powder (MOVIPREP)  100 G SOLR Take 1 kit (200 g total) by mouth as directed. 12/17/13  Yes Danie Binder, MD  prazosin (MINIPRESS) 5 MG capsule Take 1 capsule (5 mg total) by mouth at bedtime. 01/01/14  Yes [name], MD  QUEtiapine (SEROQUEL) 50 MG tablet Take 1 tablet (50 mg total) by mouth at bedtime. 01/01/14 01/01/15 Yes [name], MD  ULORIC 40 MG tablet Take 40 mg by mouth daily.  10/16/13  Yes Historical Provider, MD  amoxicillin-clavulanate (AUGMENTIN) 875-125 MG per tablet Take 1 tablet by mouth 2 (two) times daily. 01/12/14   Waldemar Dickens, MD  fluconazole (DIFLUCAN) 150 MG tablet Take 1 tablet (150 mg total) by mouth daily. Repeat dose in 3 days 01/12/14   Waldemar Dickens, MD  fluticasone [facility]) 50 MCG/ACT nasal  spray Place 2 sprays into both nostrils at bedtime. 01/12/14   Waldemar Dickens, MD  ipratropium (ATROVENT) 0.06 % nasal spray Place 2 sprays into both nostrils 4 (four) times daily. 01/12/14   Waldemar Dickens, MD   BP 158/73  Pulse 69  Temp(Src) 98.3 F (36.8 C) (Oral)  Resp 16  SpO2 98% Physical Exam  ED Course  Procedures (including critical care time) Labs Review Labs Reviewed - No data to display  Imaging Review No results found.   MDM   1. Viral URI with cough   2. Acute maxillary sinusitis, recurrence not specified   3. CKD (chronic kidney disease), unspecified stage   4. Renal cell cancer, left    Start nasal atrovent and flonase Augmentin only if not improving and sinuses pain and pressure worsens w/ fevers.  No NSAIDs due to renal impairment.  Pt Cr Cl 57.6  Cr 2.4 from labwork on 01/05/14 at [facility] (seen in [place])  Precautions given and all questions answered  Linna Darner, MD Family Medicine 01/12/2014, 11:43 AM    Waldemar Dickens, MD 01/12/14 1144

## 2014-01-12 NOTE — Discharge Instructions (Signed)
You likely have a viral infection that will clear in a few days.  Please start the intranasal atrovent to dry up nasal secretions and the flonase at night to help with nasal congestion Please continue the Allegra Please start the augmentin if your symptoms do not improve in another 3-7 days

## 2014-01-12 NOTE — Progress Notes (Signed)
Chief Complaint  Patient presents with  . Follow-up    4 week recheck Rt foot s/p cam walker DOI 11-20-13   BP 162/77  Ht 5' 11.5" (1.816 m)  Wt 296 lb 6.4 oz (134.446 kg)  BMI 40.77 kg/m2  The patient is being treated for chronic flatfoot adult acquired type PTT D. Cam Walker for 4 weeks  Lateral subtalar joint pain and continued flatfoot  Review of systems negative for numbness or tingling she does complain of back related pain with exercise  I reviewed your back x-rays from 2003 and she had degenerative disc disease at that time. She's not having any bowel or bladder dysfunction on this occasion  As far as her foot goes she has an adult acquired flatfoot deformity which is flexible she has tenderness in the subtalar area is decreased inversion she has a stable apical joint week posterior tibial tendon skin is intact she is a good distal pulse and normal sensation in the foot  We've decided to go ahead and inject the subtalar area at the point of maximal tenderness  Injection right foot Verbal consent was obtained Timeout was taken to confirm the site of right foot injection The area of maximal tenderness was anesthetized and cleaned using alcohol and ethyl chloride. 40 mg of Depo-Medrol and 3 cc 1% lidocaine was injected with good result    I've advised the patient to wear a Spenco orthotic and followup in 4 weeks if there is no improvement I will refer her to a foot and ankle specialist.

## 2014-01-12 NOTE — ED Notes (Signed)
C/o  Sinus pressure and pain.  Nasal congestion.  Cough.  And sneezing.  Symptoms present since Sunday.  Denies fever, n/v/d.   No relief with otc meds.

## 2014-01-13 ENCOUNTER — Encounter (HOSPITAL_COMMUNITY): Payer: Self-pay | Admitting: Psychiatry

## 2014-01-13 ENCOUNTER — Telehealth (HOSPITAL_COMMUNITY): Payer: Self-pay | Admitting: *Deleted

## 2014-01-13 NOTE — Telephone Encounter (Signed)
Told her my note to stop work was last written on 10/22/13

## 2014-01-27 ENCOUNTER — Ambulatory Visit (INDEPENDENT_AMBULATORY_CARE_PROVIDER_SITE_OTHER): Payer: BC Managed Care – PPO | Admitting: Psychiatry

## 2014-01-27 DIAGNOSIS — F411 Generalized anxiety disorder: Secondary | ICD-10-CM

## 2014-01-27 DIAGNOSIS — F322 Major depressive disorder, single episode, severe without psychotic features: Secondary | ICD-10-CM

## 2014-01-27 NOTE — Patient Instructions (Signed)
Discussed orally 

## 2014-01-27 NOTE — Telephone Encounter (Addendum)
Gastroenterology Pre-Procedure Review  Request Date: Requesting Physician:   PATIENT REVIEW QUESTIONS: The patient responded to the following health history questions as indicated:    1. Diabetes Melitis: yes 2. Joint replacements in the past 12 months: no 3. Major health problems in the past 3 months: no 4. Has an artificial valve or MVP: no 5. Has a defibrillator: no 6. Has been advised in past to take antibiotics in advance of a procedure like teeth cleaning: yes 7. Alcohol: NO 8. Family history Colon cancer: Aunt (mother side)    MEDICATIONS & ALLERGIES:    Patient reports the following regarding taking any blood thinners:   Plavix? no Aspirin? no Coumadin? no  Patient confirms/reports the following medications:  Current Outpatient Prescriptions  Medication Sig Dispense Refill  . amoxicillin-clavulanate (AUGMENTIN) 875-125 MG per tablet Take 1 tablet by mouth 2 (two) times daily.  20 tablet  0  . buPROPion (WELLBUTRIN XL) 150 MG 24 hr tablet Take 1 tablet (150 mg total) by mouth every morning.  30 tablet  2  . carbamazepine (TEGRETOL) 200 MG tablet Take one tablet at bedtime for one week, then take two at bedtime  60 tablet  2  . colchicine 0.6 MG tablet Take 0.6 mg by mouth as needed (gout).       . diazepam (VALIUM) 10 MG tablet Take 1 tablet (10 mg total) by mouth 4 (four) times daily.  120 tablet  2  . fluconazole (DIFLUCAN) 150 MG tablet Take 1 tablet (150 mg total) by mouth daily. Repeat dose in 3 days  2 tablet  0  . fluticasone (FLONASE) 50 MCG/ACT nasal spray Place 2 sprays into both nostrils at bedtime.  16 g  0  . furosemide (LASIX) 40 MG tablet Take 1 tablet (40 mg total) by mouth 2 (two) times daily.      . hydrALAZINE (APRESOLINE) 100 MG tablet Take 1 tablet (100 mg total) by mouth 3 (three) times daily.      Marland Kitchen ipratropium (ATROVENT) 0.06 % nasal spray Place 2 sprays into both nostrils 4 (four) times daily.  15 mL  12  . labetalol (NORMODYNE) 200 MG tablet Take 2  tablets (400 mg total) by mouth 2 (two) times daily.  270 tablet  6  . linagliptin (TRADJENTA) 5 MG TABS tablet Take 5 mg by mouth daily.      Marland Kitchen losartan (COZAAR) 50 MG tablet Take 50 mg by mouth daily.      Marland Kitchen omeprazole (PRILOSEC) 20 MG capsule Take 20 mg by mouth daily.      . peg 3350 powder (MOVIPREP) 100 G SOLR Take 1 kit (200 g total) by mouth as directed.  1 kit  0  . prazosin (MINIPRESS) 5 MG capsule Take 1 capsule (5 mg total) by mouth at bedtime.  30 capsule  2  . QUEtiapine (SEROQUEL) 50 MG tablet Take 1 tablet (50 mg total) by mouth at bedtime.  30 tablet  2  . ULORIC 40 MG tablet Take 40 mg by mouth daily.        No current facility-administered medications for this visit.    Patient confirms/reports the following allergies:  Allergies  Allergen Reactions  . Skelaxin [Metaxalone] Hives and Rash    No orders of the defined types were placed in this encounter.    AUTHORIZATION INFORMATION Primary Insurance: [place] ,  [place] #: [id number]  Group #:[id number] Pre-Cert / Josem Kaufmann required:  Pre-Cert / Auth #:    SCHEDULE INFORMATION: Procedure  has been scheduled as follows:  Date:, Time:  Location:   This Gastroenterology Pre-Precedure Review Form is being routed to the following provider(s):

## 2014-01-27 NOTE — Progress Notes (Addendum)
   THERAPIST PROGRESS NOTE  Session Time: Wednesday 01/27/2014 10:07 AM - 11:00 PM  Participation Level: Active  Behavioral Response: CasualAlertAnxious and Depressed  Type of Therapy: Individual Therapy  Treatment Goals addressed: Establish therapeutic alliance, implement coping skills to manage stress and anxiety, alleviate symptoms of depression  Interventions: CBT and Supportive  Summary: April Ward is a 53 y.o. female who  presents with symptoms of depression and anxiety. She states she initially became depressed in May of 2014 when she and her sisters began having issues regarding expenses for the family home. Her symptoms began to worsen in December 2014 when she had surgery removing 60% of her kidney as it was cancerous. Patient reports lifelong perfectionistic tendencies and says anxiety has worsened stating that she tends to think too much. Patient's symptoms included depressed mood, anxiety, racing thoughts, ruminating thoughts, sleep difficulty (2-3 hours of sleep per night) excessive worrying, crying spells, loss of interest in activities, and loss of appetite.   Patient was last seen in April 2015. Since that time, patient has been terminated from her job due to inability to attend work. She is experiencing increased stress as she has lost her life insurance and has not yet started receiving long term disability due to to employer's failure to provide necessary information to insurance carrier per patient's report. She expresses frustration and anger. Patient also reports depressed mood, loss of interest in activities, anxiety, panic attacks, poor concentration, and memory difficulty. She is experiencing financial stress as she was the only one in the family with income.    Suicidal/Homicidal: Patient reports passive suicidal ideations with no intent and no plan. She agrees to call this practice, call 911, or have someone take her to the emergency room should symptoms  worsen.  Therapist Response: Therapist works with patient to process feelings, identify coping statements, explore options regarding assistance in dealing with employer, identify ways to improve structure/routine/self-care, and identify ways to limit time working on issues regarding employer/insurance  Plan: Return again in 2 weeks. Patient agrees to use plan your day handouts and bring to next session.  Diagnosis: Axis I: MDD, GAD    Axis II: No diagnosis    BYNUM,PEGGY, LCSW 01/27/2014

## 2014-01-28 ENCOUNTER — Encounter (HOSPITAL_COMMUNITY): Payer: Self-pay | Admitting: Psychiatry

## 2014-01-28 ENCOUNTER — Ambulatory Visit (INDEPENDENT_AMBULATORY_CARE_PROVIDER_SITE_OTHER): Payer: BC Managed Care – PPO | Admitting: Psychiatry

## 2014-01-28 VITALS — BP 159/76 | HR 68 | Ht 71.5 in | Wt 304.0 lb

## 2014-01-28 DIAGNOSIS — F411 Generalized anxiety disorder: Secondary | ICD-10-CM

## 2014-01-28 DIAGNOSIS — F332 Major depressive disorder, recurrent severe without psychotic features: Secondary | ICD-10-CM

## 2014-01-28 DIAGNOSIS — F322 Major depressive disorder, single episode, severe without psychotic features: Secondary | ICD-10-CM

## 2014-01-28 MED ORDER — BUPROPION HCL ER (XL) 150 MG PO TB24: 150.0000 mg | ORAL_TABLET | ORAL | Status: DC

## 2014-01-28 MED ORDER — DIAZEPAM 10 MG PO TABS: 10.0000 mg | ORAL_TABLET | Freq: Four times a day (QID) | ORAL | Status: DC

## 2014-01-28 MED ORDER — CARBAMAZEPINE 200 MG PO TABS: 400.0000 mg | ORAL_TABLET | Freq: Every day | ORAL | Status: DC

## 2014-01-28 MED ORDER — PRAZOSIN HCL 5 MG PO CAPS: 5.0000 mg | ORAL_CAPSULE | Freq: Every day | ORAL | Status: DC

## 2014-01-28 MED ORDER — QUETIAPINE FUMARATE 50 MG PO TABS: 50.0000 mg | ORAL_TABLET | Freq: Every day | ORAL | Status: DC

## 2014-01-28 NOTE — Progress Notes (Signed)
Patient ID: April Ward, female   DOB: March 04, 1961, 53 y.o.   MRN: [id number] Patient ID: April Ward, female   DOB: Nov 11, 1960, 53 y.o.   MRN: [id number] Patient ID: April Ward, female   DOB: 21-Apr-1960, 53 y.o.   MRN: [id number] Patient ID: April Ward, female   DOB: 1960/08/17, 53 y.o.   MRN: [id number] Patient ID: April Ward, female   DOB: 18-Dec-1960, 53 y.o.   MRN: [id number]  Psychiatric Assessment Adult  Patient Identification:  April Ward Date of Evaluation:  01/28/2014 Chief Complaint: My panic attacks are worse History of Chief Complaint:   Chief Complaint  Patient presents with  . Anxiety  . Depression  . Follow-up    Anxiety Symptoms include decreased concentration, nervous/anxious behavior and suicidal ideas.     this patient is a 53 year old married black female who lives with her husband, and 31 year old son and 38 year-old nephew  In [place]. 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 The patient states that she had a cyst removed in her left kidney in December. After she had the surgery initially she was told everything was okay. She later found that 60% of her kidney was removed and that she had renal cancer. She's very upset because of the conflicting information. She also developed severe anemia and probably will have to on iron infusion. She now doesn't trust the doctors because she doesn't know what to believe. She is scheduled to see an oncologist to make sure she doesn't need any more treatment for cancer. Her nephrologist so has released her and states she needs to come back in 6 months.  The patient travels 3 hours to her job and stays there in an apartment. She's been doing this since June. Now however she doesn't feel able to function. She's been very depressed since the surgery and the  news about the cancer. She's been crying all the time. Her thoughts are racing and she is unable to sleep. She's very tired and doesn't have any appetite. She's had passive suicidal ideation but no specific plan. She doesn't want to be around people because she gets so upset.  The patient had a depressive episode in May. At that time her sisters were upsetting her. Her mother died in May 11, 2011 and left her property to the patient and her 2 sisters. 2 sisters are not good about paying bills and the light bill is in her name. Her primary doctor had put her on  Vibryd which helped but her insurance did not cover it so she is no longer taking it. She's never been on any other antidepressants or had previous psychiatric treatment  The patient returns after one month. She is doing slightly better. The Valium is helping her panic attacks and her sleep is improved. She's very upset and agitated regarding her disability from her company. She claims a company is not having enough of the paperwork to get her long-term disability set up. She constantly calls and emails but nothing is being done. In the meantime she has no income. She's also applied for federal disability and I urged her to call her attorney for this to help her. She was upset to the point of wanting to drive to [place] to the main office for her company but her son talked her out  of it. She denied wanting to hurt anyone there but claims she wanted to "speak to them face-to-face." Review of Systems  Constitutional: Positive for appetite change and fatigue.  Psychiatric/Behavioral: Positive for suicidal ideas, sleep disturbance, dysphoric mood and decreased concentration. The patient is nervous/anxious.    Physical Exam not done Depressive Symptoms: depressed mood, anhedonia, insomnia, psychomotor retardation, fatigue, feelings of worthlessness/guilt, hopelessness, suicidal thoughts without plan, anxiety,  (Hypo) Manic Symptoms:   Elevated Mood:   No Irritable Mood:  No Grandiosity:  No Distractibility:  No Labiality of Mood:  No Delusions:  No Hallucinations:  No Impulsivity:  No Sexually Inappropriate Behavior:  No Financial Extravagance:  No Flight of Ideas:  No  Anxiety Symptoms: Excessive Worry:  Yes Panic Symptoms:  Yes Agoraphobia:  Yes Obsessive Compulsive: No  Symptoms: None, Specific Phobias:  No Social Anxiety:  Yes  Psychotic Symptoms:  Hallucinations: No None Delusions:  No Paranoia:  No   Ideas of Reference:  No  PTSD Symptoms: Ever had a traumatic exposure:  No Had a traumatic exposure in the last month:  No Re-experiencing: No None Hypervigilance:  No Hyperarousal: No None Avoidance: No None  Traumatic Brain Injury: No   Past Psychiatric History: Diagnosis: Maj. depression   Hospitalizations: None   Outpatient Care: None   Substance Abuse Care: None   Self-Mutilation: None   Suicidal Attempts: None   Violent Behaviors: None    Past Medical History:   Past Medical History  Diagnosis Date  . Hypertension   . Diabetes mellitus without complication   . Heart murmur   . Depression   . Anxiety   . GERD (gastroesophageal reflux disease)   . Arthritis   . Renal disease 10/2012  . Renal insufficiency   . Gout   . Renal cancer    History of Loss of Consciousness:  No Seizure History:  No Cardiac History:  No Allergies:   Allergies  Allergen Reactions  . Skelaxin [Metaxalone] Hives and Rash   Current Medications:  Current Outpatient Prescriptions  Medication Sig Dispense Refill  . buPROPion (WELLBUTRIN XL) 150 MG 24 hr tablet Take 1 tablet (150 mg total) by mouth every morning.  30 tablet  2  . carbamazepine (TEGRETOL) 200 MG tablet Take 2 tablets (400 mg total) by mouth daily.  60 tablet  2  . colchicine 0.6 MG tablet Take 0.6 mg by mouth as needed (gout).       . diazepam (VALIUM) 10 MG tablet Take 1 tablet (10 mg total) by mouth 4 (four) times daily.  120 tablet  2  .  fluticasone (FLONASE) 50 MCG/ACT nasal spray Place 2 sprays into both nostrils at bedtime.  16 g  0  . furosemide (LASIX) 40 MG tablet Take 1 tablet (40 mg total) by mouth 2 (two) times daily.      . hydrALAZINE (APRESOLINE) 100 MG tablet Take 1 tablet (100 mg total) by mouth 3 (three) times daily.      Marland Kitchen ipratropium (ATROVENT) 0.06 % nasal spray Place 2 sprays into both nostrils 4 (four) times daily.  15 mL  12  . labetalol (NORMODYNE) 200 MG tablet Take 2 tablets (400 mg total) by mouth 2 (two) times daily.  270 tablet  6  . linagliptin (TRADJENTA) 5 MG TABS tablet Take 5 mg by mouth daily.      Marland Kitchen losartan (COZAAR) 50 MG tablet Take 50 mg by mouth daily.      Marland Kitchen omeprazole (PRILOSEC) 20 MG capsule  Take 20 mg by mouth daily.      . prazosin (MINIPRESS) 5 MG capsule Take 1 capsule (5 mg total) by mouth at bedtime.  30 capsule  2  . QUEtiapine (SEROQUEL) 50 MG tablet Take 1 tablet (50 mg total) by mouth at bedtime.  30 tablet  2  . ULORIC 40 MG tablet Take 40 mg by mouth daily.       . peg 3350 powder (MOVIPREP) 100 G SOLR Take 1 kit (200 g total) by mouth as directed.  1 kit  0   No current facility-administered medications for this visit.    Previous Psychotropic Medications:  Medication Dose   Xanax   0.5 mg 3 times a day   Vibryd  40 mg every morning                   Substance Abuse History in the last 12 months: Substance Age of 1st Use Last Use Amount Specific Type  Nicotine      Alcohol      Cannabis      Opiates      Cocaine      Methamphetamines      LSD      Ecstasy      Benzodiazepines      Caffeine      Inhalants      Others:                          Medical Consequences of Substance Abuse:n/a  Legal Consequences of Substance Abuse: n/a  Family Consequences of Substance Abuse: n/a  Blackouts:  No DT's:  No Withdrawal Symptoms:  No None  Social History: Current Place of Residence: [place] of Birth: [place] Family  Members: Husband 2 children, one nephew 4 grandchildren Marital Status:  Married Children:   Sons: 1  Daughters: 1 Relationships:  Education:  Dentist Problems/Performance:  Religious Beliefs/Practices: Christian History of Abuse: none Occupational Experiences; 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 Legal History: None Hobbies/Interests: Traveling, visiting with grandchildren  Family History:   Family History  Problem Relation Age of Onset  . Heart attack Mother   . Heart attack Father   . Depression Paternal Aunt   . Alcohol abuse Maternal Uncle   . Colon cancer Maternal Aunt   . Colon cancer Maternal Uncle     Mental Status Examination/Evaluation: Objective:  Appearance: Casual and Fairly Groomed  Engineer, water::  Fair  Speech:  Clear and Coherent  Volume:  Normal  Mood:  Anxious   Affect:  Depressed   Thought Process:  Intact  Orientation:  Full (Time, Place, and Person)  Thought Content:  WDL  Suicidal Thoughts:no  Homicidal Thoughts:no  Judgement:  Intact  Insight:  Good  Psychomotor Activity:  Normal  Akathisia:  No  Handed:  Right  AIMS (if indicated):    Assets:  Communication Skills Desire for Improvement    Laboratory/X-Ray Psychological Evaluation(s)       Assessment:  Axis I: Generalized Anxiety Disorder and Major Depression, Recurrent severe  AXIS I Generalized Anxiety Disorder and Major Depression, Recurrent severe  AXIS II Deferred  AXIS III Past Medical History  Diagnosis Date  . Hypertension   . Diabetes mellitus without complication   . Heart murmur   . Depression   . Anxiety   . GERD (gastroesophageal reflux disease)   . Arthritis   . Renal  disease 10/2012  . Renal insufficiency   . Gout   . Renal cancer      AXIS IV other psychosocial or environmental problems  AXIS V 51-60 moderate symptoms   Treatment Plan/Recommendations:  Plan of Care: Medication management   Laboratory:    Psychotherapy: The patient is seeing Maurice Small here and will reschedule with her   Medications: She'll continue her current medications   Routine PRN Medications:  No  Consultations:   Safety Concerns:  She contracts for safety   Other:    return in 2 months She's not stable enough to return to employment     [name], MD 10/29/20159:30 AM

## 2014-01-29 NOTE — Telephone Encounter (Signed)
Routing to Dr. Oneida Alar to sign off on. Looks like Dec1 for OR.

## 2014-02-04 NOTE — Telephone Encounter (Signed)
REVIEWED.  MOVI PREP SPLIT DOSING, REGULAR BREAKFAST. CLEAR LIQUIDS AFTER 9 AM.  HOLD TRADJENTA ON MORNING OF TCS.

## 2014-02-09 ENCOUNTER — Encounter: Payer: Self-pay | Admitting: Orthopedic Surgery

## 2014-02-09 ENCOUNTER — Ambulatory Visit (INDEPENDENT_AMBULATORY_CARE_PROVIDER_SITE_OTHER): Payer: BC Managed Care – PPO | Admitting: Orthopedic Surgery

## 2014-02-09 VITALS — BP 169/67 | Ht 71.5 in | Wt 296.0 lb

## 2014-02-09 DIAGNOSIS — M76829 Posterior tibial tendinitis, unspecified leg: Secondary | ICD-10-CM

## 2014-02-09 DIAGNOSIS — M6789 Other specified disorders of synovium and tendon, multiple sites: Secondary | ICD-10-CM

## 2014-02-09 DIAGNOSIS — M25571 Pain in right ankle and joints of right foot: Secondary | ICD-10-CM

## 2014-02-09 DIAGNOSIS — M722 Plantar fascial fibromatosis: Secondary | ICD-10-CM

## 2014-02-09 NOTE — Progress Notes (Signed)
Subjective:     Patient ID: April Ward, female   DOB: [birth year], 53 y.o.   MRN: [id number]  Encounter Diagnoses  Name Primary?  . Plantar fasciitis Yes  . Pain in joint, ankle and foot, right   . PTTD (posterior tibial tendon dysfunction)     HPI Status post injection subtalar joint with good result decreased pain. 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 After injection her pain is improved she was able to walk on a recent business trip 2-3 miles Note we also added Spenco orthotics Review of Systems Right heel pain new onset plantar aspect intermittent with ambulation    Objective:   Physical Exam BP 169/67 mmHg  Ht 5' 11.5" (1.816 m)  Wt 296 lb (134.265 kg)  BMI 40.71 kg/m2 She is ambulating without a limp shows tenderness over the heel subtalar joint is nontender ankle range of motion remains intact with weakness of the posterior tibial tendon and a flattened flexible flatfoot ankle joint is stable weakness of the posterior tibial tendon is not appreciated today. Skin is intact pulses are good sensation is normal    Assessment:     Encounter Diagnoses  Name Primary?  . Plantar fasciitis Yes  . Pain in joint, ankle and foot, right   . PTTD (posterior tibial tendon dysfunction)         Plan:     Recommend icing with a frozen water bottle to address the plantar fasciitis. Continue orthotics for the posterior tibial tendon dysfunction and subtalar joint pain related to pes planus

## 2014-02-09 NOTE — Patient Instructions (Signed)
Water bottle:  frozen roll the bottle on the floor

## 2014-02-10 ENCOUNTER — Ambulatory Visit (INDEPENDENT_AMBULATORY_CARE_PROVIDER_SITE_OTHER): Payer: BC Managed Care – PPO | Admitting: Psychiatry

## 2014-02-10 DIAGNOSIS — F322 Major depressive disorder, single episode, severe without psychotic features: Secondary | ICD-10-CM

## 2014-02-10 DIAGNOSIS — F411 Generalized anxiety disorder: Secondary | ICD-10-CM

## 2014-02-10 NOTE — Patient Instructions (Signed)
Discussed orally 

## 2014-02-10 NOTE — Progress Notes (Addendum)
      THERAPIST PROGRESS NOTE  Session Time: Wednesday 02/10/2014 10:00 AM - 10:55 AM  Participation Level: Active  Behavioral Response: CasualAlertAnxious and Depressed  Type of Therapy: Individual Therapy  Treatment Goals addressed: Alleviate symptoms of depression (depressed mood, feelings of hopelessness/helplessness, loss of interest in activities, isolative behaviors, irritability, crying spells) and return to previous level of functioning      Develop healthy thinking patterns about self, others, and the world.      Implement coping skills reducing anxiety, excessive worry, and improving daily functioning      Process grief and loss issues related to changed functioning and loss of job  Interventions: CBT and Supportive  Summary: April Ward is a 53 y.o. female who  presents with symptoms of depression and anxiety. She states she initially became depressed in May of 2014 when she and her sisters began having issues regarding expenses for the family home. 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 Patient reports lifelong perfectionistic tendencies and says anxiety has worsened stating that she tends to think too much. Patient's symptoms included depressed mood, anxiety, racing thoughts, ruminating thoughts, sleep difficulty (2-3 hours of sleep per night) excessive worrying, crying spells, loss of interest in activities, and loss of appetite.   Patient was last seen in April 2015. Since that time, patient has been terminated from her job due to inability to attend work. She is experiencing increased stress as she has lost her life insurance and has not yet started receiving long term disability due to to employer's failure to provide necessary information to insurance carrier per patient's report. She expresses frustration and anger. Patient also reports depressed mood, loss of interest in activities, anxiety, panic  attacks, poor concentration, and memory difficulty. She is experiencing financial stress as she was the only one in the family with income.   Patient reports some financial relief as she received a phone call this past Monday indicating approval for long-term disability for the next 4 months. However, patient is worried there will be a gap in coverage in it is time for review at the end of the 4 months. She continues to have very negative feelings about the way she was treated on her job. She misses her coworkers and being able to work. She has tried to become more involved in activities including attending to events at church and spending time with her granddaughter walking in the park. However, patient continues to experience depressed mood, irritability, feelings of hopelessness and helplessness.   Suicidal/Homicidal: no  Therapist Response: Therapist works with patient to process feelings, develop treatment plan, provide psychoeducation regarding depression and anxiety, discussed rationale for improving self-care,  Plan: Return again in 2 weeks. Patient agrees to use plan your day handouts and bring to next session.  Diagnosis: Axis I: MDD, GAD    Axis II: No diagnosis    Yvonne Stopher, LCSW 02/10/2014

## 2014-02-11 ENCOUNTER — Other Ambulatory Visit: Payer: Self-pay

## 2014-02-11 NOTE — Telephone Encounter (Signed)
Had to do Dec 15 and pt is aware.

## 2014-02-12 NOTE — Telephone Encounter (Signed)
Rx sent to the pharmacy and instructions mailed to pt.  

## 2014-02-12 NOTE — Addendum Note (Signed)
Addended by: Everardo All on: 02/12/2014 09:43 AM   Modules accepted: Orders

## 2014-02-17 NOTE — Telephone Encounter (Signed)
REVIEWED-NO ADDITIONAL RECOMMENDATIONS. 

## 2014-02-18 ENCOUNTER — Ambulatory Visit (INDEPENDENT_AMBULATORY_CARE_PROVIDER_SITE_OTHER): Payer: BC Managed Care – PPO | Admitting: Cardiology

## 2014-02-18 VITALS — BP 179/78 | HR 65 | Ht 71.0 in | Wt 307.0 lb

## 2014-02-18 DIAGNOSIS — I10 Essential (primary) hypertension: Secondary | ICD-10-CM

## 2014-02-18 DIAGNOSIS — E785 Hyperlipidemia, unspecified: Secondary | ICD-10-CM

## 2014-02-18 MED ORDER — CLONIDINE HCL 0.1 MG/24HR TD PTWK: 0.1000 mg | MEDICATED_PATCH | TRANSDERMAL | Status: DC

## 2014-02-18 MED ORDER — ASPIRIN EC 81 MG PO TBEC: 81.0000 mg | DELAYED_RELEASE_TABLET | Freq: Every day | ORAL | Status: DC

## 2014-02-18 MED ORDER — ATORVASTATIN CALCIUM 40 MG PO TABS: 40.0000 mg | ORAL_TABLET | Freq: Every day | ORAL | Status: DC

## 2014-02-18 NOTE — Patient Instructions (Addendum)
Begin Aspirin 81mg  daily - OTC  Begin Lipitor 40mg  daily  Begin Clonidine 0.1mg  patch weekly Continue all other medications.   New prescriptions have been sent to [facility].   DASH diet info sheet provided today.  Lab for FLP (fasting lipid panel) - Reminder:  Nothing to eat or drink after 12 midnight prior to labs. Office will contact with results via phone or letter.   Follow up in  4 weeks

## 2014-02-18 NOTE — Progress Notes (Signed)
Clinical Summary April Ward is a 53 y.o.female seen today for follow up of the following medical problems. This is a focused visit on her history of HTN  1. HTN  - reports diagnosed with HTN age 32.  - checks at home 2 times a day. Typically around 140s-170s/70-80s - compliant with meds but has not taken yet today. - limiting sodium intake - CT scan in 2006 with normal renal arteries. TSH 1.80. No EtoH. Normal renin and aldo ratio. Since last visit she was tested for sleep apnea, I do not have the results but she reports she was told it was negative.      Past Medical History  Diagnosis Date  . Hypertension   . Diabetes mellitus without complication   . Heart murmur   . Depression   . Anxiety   . GERD (gastroesophageal reflux disease)   . Arthritis   . Renal disease 10/2012  . Renal insufficiency   . Gout   . Renal cancer      Allergies  Allergen Reactions  . Skelaxin [Metaxalone] Hives and Rash     Current Outpatient Prescriptions  Medication Sig Dispense Refill  . buPROPion (WELLBUTRIN XL) 150 MG 24 hr tablet Take 1 tablet (150 mg total) by mouth every morning. 30 tablet 2  . carbamazepine (TEGRETOL) 200 MG tablet Take 2 tablets (400 mg total) by mouth daily. 60 tablet 2  . colchicine 0.6 MG tablet Take 0.6 mg by mouth as needed (gout).     . diazepam (VALIUM) 10 MG tablet Take 1 tablet (10 mg total) by mouth 4 (four) times daily. 120 tablet 2  . fluticasone (FLONASE) 50 MCG/ACT nasal spray Place 2 sprays into both nostrils at bedtime. 16 g 0  . furosemide (LASIX) 40 MG tablet Take 1 tablet (40 mg total) by mouth 2 (two) times daily.    . hydrALAZINE (APRESOLINE) 100 MG tablet Take 1 tablet (100 mg total) by mouth 3 (three) times daily.    Marland Kitchen ipratropium (ATROVENT) 0.06 % nasal spray Place 2 sprays into both nostrils 4 (four) times daily. 15 mL 12  . labetalol (NORMODYNE) 200 MG tablet Take 2 tablets (400 mg total) by mouth 2 (two) times daily. 270 tablet 6  .  linagliptin (TRADJENTA) 5 MG TABS tablet Take 5 mg by mouth daily.    Marland Kitchen losartan (COZAAR) 50 MG tablet Take 50 mg by mouth daily.    Marland Kitchen omeprazole (PRILOSEC) 20 MG capsule Take 20 mg by mouth daily.    . peg 3350 powder (MOVIPREP) 100 G SOLR Take 1 kit (200 g total) by mouth as directed. 1 kit 0  . prazosin (MINIPRESS) 5 MG capsule Take 1 capsule (5 mg total) by mouth at bedtime. 30 capsule 2  . QUEtiapine (SEROQUEL) 50 MG tablet Take 1 tablet (50 mg total) by mouth at bedtime. 30 tablet 2  . ULORIC 40 MG tablet Take 40 mg by mouth daily.      No current facility-administered medications for this visit.     Past Surgical History  Procedure Laterality Date  . Cesarean section  [id number]  . Knee arthroscopy with medial menisectomy Right 08/22/2012    Procedure: KNEE ARTHROSCOPY WITH PARTIAL MEDIAL MENISECTOMY;  Surgeon: Carole Civil, MD;  Location: [facility];  Service: Orthopedics;  Laterality: Right;  . Chondroplasty Right 08/22/2012    Procedure: CHONDROPLASTY;  Surgeon: Carole Civil, MD;  Location: [facility];  Service: Orthopedics;  Laterality: Right;  .  Partial nephrectomy  Dec 2014    left     Allergies  Allergen Reactions  . Skelaxin [Metaxalone] Hives and Rash      Family History  Problem Relation Age of Onset  . Heart attack Mother   . Heart attack Father   . Depression Paternal Aunt   . Alcohol abuse Maternal Uncle   . Colon cancer Maternal Aunt   . Colon cancer Maternal Uncle      Social History Ms. Donahey reports that she has never smoked. She has never used smokeless tobacco. Ms. Parveen reports that she does not drink alcohol.   Review of Systems CONSTITUTIONAL: No weight loss, fever, chills, weakness or fatigue.  HEENT: Eyes: No visual loss, blurred vision, double vision or yellow sclerae.No hearing loss, sneezing, congestion, runny nose or sore throat.  SKIN: No rash or itching.  CARDIOVASCULAR: no chest pain, no palpitation, no orthopnea RESPIRATORY: No  shortness of breath, cough or sputum.  GASTROINTESTINAL: No anorexia, nausea, vomiting or diarrhea. No abdominal pain or blood.  GENITOURINARY: No burning on urination, no polyuria NEUROLOGICAL: No headache, dizziness, syncope, paralysis, ataxia, numbness or tingling in the extremities. No change in bowel or bladder control.  MUSCULOSKELETAL: No muscle, back pain, joint pain or stiffness.  LYMPHATICS: No enlarged nodes. No history of splenectomy.  PSYCHIATRIC: No history of depression or anxiety.  ENDOCRINOLOGIC: No reports of sweating, cold or heat intolerance. No polyuria or polydipsia.  Marland Kitchen   Physical Examination p 65 bp 179/78 Wt 307 lbs BMI 43 Gen: resting comfortably, no acute distress HEENT: no scleral icterus, pupils equal round and reactive, no palptable cervical adenopathy,  CV: RRR, no m/r/g, no JVD, no carotid bruits Resp: Clear to auscultation bilaterally GI: abdomen is soft, non-tender, non-distended, normal bowel sounds, no hepatosplenomegaly MSK: extremities are warm, no edema.  Skin: warm, no rash Neuro:  no focal deficits Psych: appropriate affect   Diagnostic Studies 08/2013 Labs  TC 192 TG 231 LDL HDL 37 LDL 109  K 4.0 Cr 2 BUN 21 GFR 30 TSH 1.80  09/2013 Carotid US 1-39% bilateral ICA disease  09/2013 Exercise MPI Overall low risk exercise/Lexiscan Cardiolite. Patient had limited exercise capacity achieving maximum work load of 7 METS, limited by shortness of breath and hypertensive response. There were no clearly diagnostic ST segment abnormalities. Occasional to frequent PVCs and ventricular couplets were noted early in exercise. There were no sustained arrhythmias. Perfusion imaging shows probable variable soft tissue attenuation, less likely a minor degree of basal lateral ischemia. LVEF is normal at 61% with upper normal chamber volume and no obvious wall motion abnormalities.  Assessment and Plan  1. Resistant HTN  - difficult to control HTN, have  not found evidence of secondary HTN - CT 2006 without renal artery stenosis, normal TSH, no EtoH. Normal renin to aldo ratio. Reportedly negative sleep study however we are contacting Dr Dois Davenport office to varify. - despite resistant HTN, avoid aldactone due to renal function - She is on multiple agents, her renal function limits some of our options. Has some low heart rates at times, will not increase labetalol. Will try low dose clonidine patch at 0.56m/24hrs and follow pressures.   2. Hyperlipidemia - due to DM she should be on at least moderate strength statin, change to atrova 472mdaily         JoArnoldo LenisM.D.

## 2014-02-19 ENCOUNTER — Encounter: Payer: Self-pay | Admitting: Cardiology

## 2014-02-23 ENCOUNTER — Telehealth: Payer: Self-pay | Admitting: *Deleted

## 2014-02-23 NOTE — Telephone Encounter (Signed)
-----   Message from Arnoldo Lenis, MD sent at 02/19/2014  2:11 PM EST ----- Can you please contact Dr Laqueta Linden office about this patient. She was referred for OSA evaluation, we have her July 2015 clinic note from that visit but its not clear if she ever had the sleep study or if she has OSA. Are there any updated results?  Zandra Abts MD

## 2014-02-24 ENCOUNTER — Ambulatory Visit (HOSPITAL_COMMUNITY): Payer: Self-pay | Admitting: Psychiatry

## 2014-02-24 NOTE — Telephone Encounter (Signed)
Fax was sent to Dr. Dois Davenport office on 02/18/2014.  Called their office today also to request sleep study.  Colletta Maryland will be scanning into EP[employer] today.

## 2014-03-01 ENCOUNTER — Telehealth: Payer: Self-pay

## 2014-03-01 NOTE — Telephone Encounter (Signed)
Pt called with concerns of why her procedure date and pre-op date is not showing up in my chart. She said that is what she goes by for her appts, because she forgets so badly. I told her I did not know the answer, I will direct it to my office manager and try to find an answer. I told her since she has the paper with her instructions and date and pre-op date, she should put it on the fridge to remind her. I will call her back when I have an answer to her question.

## 2014-03-03 ENCOUNTER — Telehealth: Payer: Self-pay | Admitting: Gastroenterology

## 2014-03-03 NOTE — Telephone Encounter (Signed)
Pt called. She is scheduled with [facility] on 12/15 and her pharmacy said she needed a PA on her prep and that they had faxed something over to [place]. I told her that I haven't seen anything via fax but I would check and have DS call her back. [phone]

## 2014-03-03 NOTE — Telephone Encounter (Signed)
Left the full message on the recorder, and call if questions.

## 2014-03-03 NOTE — Telephone Encounter (Signed)
Her appointments should show in her My Chart account, however she can call [phone] for further assistance.

## 2014-03-04 MED ORDER — PEG 3350-KCL-NA BICARB-NACL 420 G PO SOLR: 4000.0000 mL | ORAL | Status: DC

## 2014-03-04 NOTE — Telephone Encounter (Signed)
I called pt and told her that I will send in Trilyte Prep and mail her new instructions.

## 2014-03-10 ENCOUNTER — Telehealth: Payer: Self-pay

## 2014-03-10 NOTE — Patient Instructions (Signed)
April Ward  03/10/2014   Your procedure is scheduled on:   03/16/2014  Report to [facility] at  800 AM.  Call this number if you have problems the morning of surgery: [phone]   Remember:   Do not eat food or drink liquids after midnight.   Take these medicines the morning of surgery with A SIP OF WATER: uloric, wellbutrin, tegretol, clonidine, colchicine, valium, hydralazine, labetolol, losartan, prilosec. Take your atrovent before you come.   Do not wear jewelry, make-up or nail polish.  Do not wear lotions, powders, or perfumes.   Do not shave 48 hours prior to surgery. Men may shave face and neck.  Do not bring valuables to the hospital.  [facility] is not responsible for any belongings or valuables.               Contacts, dentures or bridgework may not be worn into surgery.  Leave suitcase in the car. After surgery it may be brought to your room.  For patients admitted to the hospital, discharge time is determined by your treatment team.               Patients discharged the day of surgery will not be allowed to drive home.  Name and phone number of your driver: family  Special Instructions: N/A   Please read over the following fact sheets that you were given: Pain Booklet, Coughing and Deep Breathing, Surgical Site Infection Prevention, Anesthesia Post-op Instructions and Care and Recovery After Surgery Colonoscopy A colonoscopy is an exam to look at the entire large intestine (colon). This exam can help find problems such as tumors, polyps, inflammation, and areas of bleeding. The exam takes about 1 hour.  LET [facility] CARE PROVIDER KNOW ABOUT:   Any allergies you have.  All medicines you are taking, including vitamins, herbs, eye drops, creams, and over-the-counter medicines.  Previous problems you or members of your family have had with the use of anesthetics.  Any blood disorders you have.  Previous surgeries you have had.  Medical conditions you  have. RISKS AND COMPLICATIONS  Generally, this is a safe procedure. However, as with any procedure, complications can occur. Possible complications include:  Bleeding.  Tearing or rupture of the colon wall.  Reaction to medicines given during the exam.  Infection (rare). BEFORE THE PROCEDURE   Ask your health care provider about changing or stopping your regular medicines.  You may be prescribed an oral bowel prep. This involves drinking a large amount of medicated liquid, starting the day before your procedure. The liquid will cause you to have multiple loose stools until your stool is almost clear or light green. This cleans out your colon in preparation for the procedure.  Do not eat or drink anything else once you have started the bowel prep, unless your health care provider tells you it is safe to do so.  Arrange for someone to drive you home after the procedure. PROCEDURE   You will be given medicine to help you relax (sedative).  You will lie on your side with your knees bent.  A long, flexible tube with a light and camera on the end (colonoscope) will be inserted through the rectum and into the colon. The camera sends video back to a computer screen as it moves through the colon. The colonoscope also releases carbon dioxide gas to inflate the colon. This helps your health care provider see the area better.  During the  exam, your health care provider may take a small tissue sample (biopsy) to be examined under a microscope if any abnormalities are found.  The exam is finished when the entire colon has been viewed. AFTER THE PROCEDURE   Do not drive for 24 hours after the exam.  You may have a small amount of blood in your stool.  You may pass moderate amounts of gas and have mild abdominal cramping or bloating. This is caused by the gas used to inflate your colon during the exam.  Ask when your test results will be ready and how you will get your results. Make sure you  get your test results. Document Released: 03/16/2000 Document Revised: 01/07/2013 Document Reviewed: 11/24/2012 [facility] Patient Information 2015 [place], [place]. This information is not intended to replace advice given to you by your health care provider. Make sure you discuss any questions you have with your health care provider. PATIENT INSTRUCTIONS POST-ANESTHESIA  IMMEDIATELY FOLLOWING SURGERY:  Do not drive or operate machinery for the first twenty four hours after surgery.  Do not make any important decisions for twenty four hours after surgery or while taking narcotic pain medications or sedatives.  If you develop intractable nausea and vomiting or a severe headache please notify your doctor immediately.  FOLLOW-UP:  Please make an appointment with your surgeon as instructed. You do not need to follow up with anesthesia unless specifically instructed to do so.  WOUND CARE INSTRUCTIONS (if applicable):  Keep a dry clean dressing on the anesthesia/puncture wound site if there is drainage.  Once the wound has quit draining you may leave it open to air.  Generally you should leave the bandage intact for twenty four hours unless there is drainage.  If the epidural site drains for more than 36-48 hours please call the anesthesia department.  QUESTIONS?:  Please feel free to call your physician or the hospital operator if you have any questions, and they will be happy to assist you.

## 2014-03-10 NOTE — Telephone Encounter (Signed)
I called pt to update triage and she said there has been no change in her medications.

## 2014-03-11 ENCOUNTER — Other Ambulatory Visit: Payer: Self-pay | Admitting: *Deleted

## 2014-03-11 ENCOUNTER — Encounter (HOSPITAL_COMMUNITY)
Admission: RE | Admit: 2014-03-11 | Discharge: 2014-03-11 | Disposition: A | Discharge status: Home / Self Care | Payer: BC Managed Care – PPO | Source: Ambulatory Visit | Attending: Gastroenterology | Admitting: Gastroenterology

## 2014-03-11 ENCOUNTER — Encounter: Payer: Self-pay | Admitting: Cardiology

## 2014-03-11 ENCOUNTER — Encounter (HOSPITAL_COMMUNITY): Payer: Self-pay

## 2014-03-11 DIAGNOSIS — Z01812 Encounter for preprocedural laboratory examination: Secondary | ICD-10-CM | POA: Diagnosis not present

## 2014-03-11 DIAGNOSIS — E785 Hyperlipidemia, unspecified: Secondary | ICD-10-CM

## 2014-03-11 LAB — CBC
HCT: 30 % — ABNORMAL LOW (ref 36.0–46.0)
Hemoglobin: 9.8 g/dL — ABNORMAL LOW (ref 12.0–15.0)
MCH: 23.3 pg — ABNORMAL LOW (ref 26.0–34.0)
MCHC: 32.7 g/dL (ref 30.0–36.0)
MCV: 71.4 fL — ABNORMAL LOW (ref 78.0–100.0)
Platelets: 363 10*3/uL (ref 150–400)
RBC: 4.2 MIL/uL (ref 3.87–5.11)
RDW: 16 % — ABNORMAL HIGH (ref 11.5–15.5)
WBC: 8.3 10*3/uL (ref 4.0–10.5)

## 2014-03-11 LAB — BASIC METABOLIC PANEL
Anion gap: 16 — ABNORMAL HIGH (ref 5–15)
BUN: 43 mg/dL — ABNORMAL HIGH (ref 6–23)
CO2: 23 mEq/L (ref 19–32)
Calcium: 9.2 mg/dL (ref 8.4–10.5)
Chloride: 102 mEq/L (ref 96–112)
Creatinine, Ser: 3.45 mg/dL — ABNORMAL HIGH (ref 0.50–1.10)
GFR calc Af Amer: 16 mL/min — ABNORMAL LOW (ref >90)
GFR calc non Af Amer: 14 mL/min — ABNORMAL LOW (ref >90)
Glucose, Bld: 130 mg/dL — ABNORMAL HIGH (ref 70–99)
Potassium: 3.9 mEq/L (ref 3.7–5.3)
Sodium: 141 mEq/L (ref 137–147)

## 2014-03-11 NOTE — Telephone Encounter (Signed)
Orders are in for Lipids

## 2014-03-12 LAB — LIPID PANEL
Cholesterol: 215 mg/dL — ABNORMAL HIGH (ref 0–200)
HDL: 33 mg/dL — ABNORMAL LOW (ref >39)
LDL Cholesterol: 124 mg/dL — ABNORMAL HIGH (ref 0–99)
Total CHOL/HDL Ratio: 6.5 Ratio
Triglycerides: 288 mg/dL — ABNORMAL HIGH (ref <150)
VLDL: 58 mg/dL — ABNORMAL HIGH (ref 0–40)

## 2014-03-12 NOTE — Pre-Procedure Instructions (Signed)
H&H- 9.8/30.0. Dr Patsey Berthold notified and no orders given.

## 2014-03-15 ENCOUNTER — Encounter (HOSPITAL_COMMUNITY): Payer: Self-pay | Admitting: Psychiatry

## 2014-03-15 ENCOUNTER — Ambulatory Visit (INDEPENDENT_AMBULATORY_CARE_PROVIDER_SITE_OTHER): Payer: BC Managed Care – PPO | Admitting: Psychiatry

## 2014-03-15 VITALS — BP 151/74 | HR 60 | Ht 71.0 in | Wt 299.2 lb

## 2014-03-15 DIAGNOSIS — F322 Major depressive disorder, single episode, severe without psychotic features: Secondary | ICD-10-CM

## 2014-03-15 DIAGNOSIS — F411 Generalized anxiety disorder: Secondary | ICD-10-CM

## 2014-03-15 DIAGNOSIS — F332 Major depressive disorder, recurrent severe without psychotic features: Secondary | ICD-10-CM

## 2014-03-15 MED ORDER — QUETIAPINE FUMARATE 50 MG PO TABS: 50.0000 mg | ORAL_TABLET | Freq: Every day | ORAL | Status: DC

## 2014-03-15 MED ORDER — BUPROPION HCL ER (XL) 150 MG PO TB24: 150.0000 mg | ORAL_TABLET | ORAL | Status: DC

## 2014-03-15 MED ORDER — DIAZEPAM 10 MG PO TABS: 10.0000 mg | ORAL_TABLET | Freq: Four times a day (QID) | ORAL | Status: DC

## 2014-03-15 MED ORDER — CARBAMAZEPINE 200 MG PO TABS: 400.0000 mg | ORAL_TABLET | Freq: Every day | ORAL | Status: DC

## 2014-03-15 MED ORDER — PRAZOSIN HCL 5 MG PO CAPS: 5.0000 mg | ORAL_CAPSULE | Freq: Every day | ORAL | Status: DC

## 2014-03-15 NOTE — Progress Notes (Signed)
Patient ID: April Ward, female   DOB: 1960/07/29, 53 y.o.   MRN: [id number] Patient ID: NEEYA Ward, female   DOB: 23-Mar-1961, 53 y.o.   MRN: [id number] Patient ID: April Ward, female   DOB: 04-14-1960, 53 y.o.   MRN: [id number] Patient ID: [name], female   DOB: [birth year], 53 y.o.   MRN: [id number] Patient ID: April Ward, female   DOB: 03/25/1961, 53 y.o.   MRN: [id number] Patient ID: April Ward, female   DOB: 12/21/1960, 53 y.o.   MRN: [id number]  Psychiatric Assessment Adult  Patient Identification:  April Ward Date of Evaluation:  03/15/2014 Chief Complaint: "I really miss my parents." History of Chief Complaint:   Chief Complaint  Patient presents with  . Depression  . Anxiety  . Follow-up    Anxiety Symptoms include decreased concentration, nervous/anxious behavior and suicidal ideas.     this patient is a 53 year old married black female who lives with her husband, and 41 year old son and 1 year-old nephew  In [place]. 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 The patient states that she had a cyst removed in her left kidney in December. After she had the surgery initially she was told everything was okay. She later found that 60% of her kidney was removed and that she had renal cancer. She's very upset because of the conflicting information. She also developed severe anemia and probably will have to on iron infusion. She now doesn't trust the doctors because she doesn't know what to believe. She is scheduled to see an oncologist to make sure she doesn't need any more treatment for cancer. Her nephrologist so has released her and states she needs to come back in 6 months.  The patient travels 3 hours to her job and stays there in an apartment. She's been doing this since June. Now however she  doesn't feel able to function. She's been very depressed since the surgery and the news about the cancer. She's been crying all the time. Her thoughts are racing and she is unable to sleep. She's very tired and doesn't have any appetite. She's had passive suicidal ideation but no specific plan. She doesn't want to be around people because she gets so upset.  The patient had a depressive episode in May. At that time her sisters were upsetting her. Her mother died in 2011-06-09 and left her property to the patient and her 2 sisters. 2 sisters are not good about paying bills and the light bill is in her name. Her primary doctor had put her on  Vibryd which helped but her insurance did not cover it so she is no longer taking it. She's never been on any other antidepressants or had previous psychiatric treatment  The patient returns after one month. She is tearful and upset today. Her father died 36 years ago and her mother died 2 years ago. Without them Christmas is not the same for her. Her mother had a lot of traditions and her children miss them. She doesn't seem to have the energy to carry on these traditions although I suggested it would help. She is also upset because her 2 sisters don't seem to want to do anything with the family and things are the same as they used to be. She still feels like her  medications are helpful but there are days that she spends in bed and she doesn't feel stable enough to return to work. She denies suicidal ideation. She missed her last a counseling appointment but is going to return Review of Systems  Constitutional: Positive for appetite change and fatigue.  Psychiatric/Behavioral: Positive for suicidal ideas, sleep disturbance, dysphoric mood and decreased concentration. The patient is nervous/anxious.    Physical Exam not done Depressive Symptoms: depressed mood, anhedonia, insomnia, psychomotor retardation, fatigue, feelings of  worthlessness/guilt, hopelessness, suicidal thoughts without plan, anxiety,  (Hypo) Manic Symptoms:   Elevated Mood:  No Irritable Mood:  No Grandiosity:  No Distractibility:  No Labiality of Mood:  No Delusions:  No Hallucinations:  No Impulsivity:  No Sexually Inappropriate Behavior:  No Financial Extravagance:  No Flight of Ideas:  No  Anxiety Symptoms: Excessive Worry:  Yes Panic Symptoms:  Yes Agoraphobia:  Yes Obsessive Compulsive: No  Symptoms: None, Specific Phobias:  No Social Anxiety:  Yes  Psychotic Symptoms:  Hallucinations: No None Delusions:  No Paranoia:  No   Ideas of Reference:  No  PTSD Symptoms: Ever had a traumatic exposure:  No Had a traumatic exposure in the last month:  No Re-experiencing: No None Hypervigilance:  No Hyperarousal: No None Avoidance: No None  Traumatic Brain Injury: No   Past Psychiatric History: Diagnosis: Maj. depression   Hospitalizations: None   Outpatient Care: None   Substance Abuse Care: None   Self-Mutilation: None   Suicidal Attempts: None   Violent Behaviors: None    Past Medical History:   Past Medical History  Diagnosis Date  . Hypertension   . Diabetes mellitus without complication   . Heart murmur   . Depression   . Anxiety   . GERD (gastroesophageal reflux disease)   . Arthritis   . Gout   . Renal disease 10/2012  . Renal insufficiency   . Renal cancer    History of Loss of Consciousness:  No Seizure History:  No Cardiac History:  No Allergies:   Allergies  Allergen Reactions  . Skelaxin [Metaxalone] Hives and Rash   Current Medications:  Current Outpatient Prescriptions  Medication Sig Dispense Refill  . aspirin EC 81 MG tablet Take 1 tablet (81 mg total) by mouth daily.    Marland Kitchen atorvastatin (LIPITOR) 40 MG tablet Take 1 tablet (40 mg total) by mouth daily. 30 tablet 6  . buPROPion (WELLBUTRIN XL) 150 MG 24 hr tablet Take 1 tablet (150 mg total) by mouth every morning. 30 tablet 2  .  carbamazepine (TEGRETOL) 200 MG tablet Take 2 tablets (400 mg total) by mouth daily. 60 tablet 2  . cloNIDine (CATAPRES - DOSED IN MG/24 HR) 0.1 mg/24hr patch Place 1 patch (0.1 mg total) onto the skin once a week. 4 patch 6  . colchicine 0.6 MG tablet Take 0.6 mg by mouth as needed (gout).     . diazepam (VALIUM) 10 MG tablet Take 1 tablet (10 mg total) by mouth 4 (four) times daily. 120 tablet 2  . fluticasone (FLONASE) 50 MCG/ACT nasal spray Place 2 sprays into both nostrils at bedtime. 16 g 0  . furosemide (LASIX) 40 MG tablet Take 1 tablet (40 mg total) by mouth 2 (two) times daily.    . hydrALAZINE (APRESOLINE) 100 MG tablet Take 1 tablet (100 mg total) by mouth 3 (three) times daily.    Marland Kitchen ipratropium (ATROVENT) 0.06 % nasal spray Place 2 sprays into both nostrils 4 (four) times daily. 15  mL 12  . labetalol (NORMODYNE) 200 MG tablet Take 2 tablets (400 mg total) by mouth 2 (two) times daily. 270 tablet 6  . linagliptin (TRADJENTA) 5 MG TABS tablet Take 5 mg by mouth daily.    Marland Kitchen losartan (COZAAR) 50 MG tablet Take 75 mg by mouth daily.     Marland Kitchen omeprazole (PRILOSEC) 20 MG capsule Take 20 mg by mouth daily.    . peg 3350 powder (MOVIPREP) 100 G SOLR Take 1 kit (200 g total) by mouth as directed. 1 kit 0  . prazosin (MINIPRESS) 5 MG capsule Take 1 capsule (5 mg total) by mouth at bedtime. 30 capsule 2  . QUEtiapine (SEROQUEL) 50 MG tablet Take 1 tablet (50 mg total) by mouth at bedtime. 30 tablet 2  . ULORIC 40 MG tablet Take 40 mg by mouth daily.      No current facility-administered medications for this visit.    Previous Psychotropic Medications:  Medication Dose   Xanax   0.5 mg 3 times a day   Vibryd  40 mg every morning                   Substance Abuse History in the last 12 months: Substance Age of 1st Use Last Use Amount Specific Type  Nicotine      Alcohol      Cannabis      Opiates      Cocaine      Methamphetamines      LSD      Ecstasy      Benzodiazepines       Caffeine      Inhalants      Others:                          Medical Consequences of Substance Abuse:n/a  Legal Consequences of Substance Abuse: n/a  Family Consequences of Substance Abuse: n/a  Blackouts:  No DT's:  No Withdrawal Symptoms:  No None  Social History: Current Place of Residence: [place] of Birth: [place] Family Members: Husband 2 children, one nephew 4 grandchildren Marital Status:  Married Children:   Sons: 1  Daughters: 1 Relationships:  Education:  Dentist Problems/Performance:  Religious Beliefs/Practices: Christian History of Abuse: none Occupational Experiences; 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 Legal History: None Hobbies/Interests: Traveling, visiting with grandchildren  Family History:   Family History  Problem Relation Age of Onset  . Heart attack Mother   . Heart attack Father   . Depression Paternal Aunt   . Alcohol abuse Maternal Uncle   . Colon cancer Maternal Aunt   . Colon cancer Maternal Uncle     Mental Status Examination/Evaluation: Objective:  Appearance: Casual and Fairly Groomed  Engineer, water::  Fair  Speech:  Clear and Coherent  Volume:  Normal  Mood:  Sad and tearful   Affect:  Depressed   Thought Process:  Intact  Orientation:  Full (Time, Place, and Person)  Thought Content:  WDL  Suicidal Thoughts:no  Homicidal Thoughts:no  Judgement:  Intact  Insight:  Good  Psychomotor Activity:  Normal  Akathisia:  No  Handed:  Right  AIMS (if indicated):    Assets:  Communication Skills Desire for Improvement    Laboratory/X-Ray Psychological Evaluation(s)       Assessment:  Axis I: Generalized Anxiety Disorder and Major Depression, Recurrent severe  AXIS I Generalized Anxiety Disorder and  Major Depression, Recurrent severe  AXIS II Deferred  AXIS III Past Medical History  Diagnosis Date  . Hypertension   . Diabetes mellitus  without complication   . Heart murmur   . Depression   . Anxiety   . GERD (gastroesophageal reflux disease)   . Arthritis   . Gout   . Renal disease 10/2012  . Renal insufficiency   . Renal cancer      AXIS IV other psychosocial or environmental problems  AXIS V 51-60 moderate symptoms   Treatment Plan/Recommendations:  Plan of Care: Medication management   Laboratory:   Psychotherapy: The patient is seeing Maurice Small here and will reschedule with her   Medications: She'll continue her current medications   Routine PRN Medications:  No  Consultations:   Safety Concerns:  She contracts for safety   Other:    return in 6 weeks She's not stable enough to return to employment     [name], MD 12/14/201510:11 AM

## 2014-03-16 ENCOUNTER — Ambulatory Visit (HOSPITAL_COMMUNITY): Payer: BC Managed Care – PPO | Admitting: Anesthesiology

## 2014-03-16 ENCOUNTER — Encounter (HOSPITAL_COMMUNITY)
Admission: RE | Disposition: A | Discharge status: Home / Self Care | Payer: Self-pay | Source: Ambulatory Visit | Attending: Gastroenterology

## 2014-03-16 ENCOUNTER — Ambulatory Visit (HOSPITAL_COMMUNITY)
Admission: RE | Admit: 2014-03-16 | Discharge: 2014-03-16 | Disposition: A | Discharge status: Home / Self Care | Payer: BC Managed Care – PPO | Source: Ambulatory Visit | Attending: Gastroenterology | Admitting: Gastroenterology

## 2014-03-16 ENCOUNTER — Encounter (HOSPITAL_COMMUNITY): Payer: Self-pay | Admitting: *Deleted

## 2014-03-16 DIAGNOSIS — E119 Type 2 diabetes mellitus without complications: Secondary | ICD-10-CM | POA: Insufficient documentation

## 2014-03-16 DIAGNOSIS — F419 Anxiety disorder, unspecified: Secondary | ICD-10-CM | POA: Insufficient documentation

## 2014-03-16 DIAGNOSIS — M109 Gout, unspecified: Secondary | ICD-10-CM | POA: Insufficient documentation

## 2014-03-16 DIAGNOSIS — Z7982 Long term (current) use of aspirin: Secondary | ICD-10-CM | POA: Insufficient documentation

## 2014-03-16 DIAGNOSIS — Z8553 Personal history of malignant neoplasm of renal pelvis: Secondary | ICD-10-CM | POA: Insufficient documentation

## 2014-03-16 DIAGNOSIS — R011 Cardiac murmur, unspecified: Secondary | ICD-10-CM | POA: Diagnosis not present

## 2014-03-16 DIAGNOSIS — I10 Essential (primary) hypertension: Secondary | ICD-10-CM | POA: Diagnosis not present

## 2014-03-16 DIAGNOSIS — Z1211 Encounter for screening for malignant neoplasm of colon: Secondary | ICD-10-CM | POA: Insufficient documentation

## 2014-03-16 DIAGNOSIS — M1990 Unspecified osteoarthritis, unspecified site: Secondary | ICD-10-CM | POA: Insufficient documentation

## 2014-03-16 DIAGNOSIS — K648 Other hemorrhoids: Secondary | ICD-10-CM | POA: Diagnosis not present

## 2014-03-16 DIAGNOSIS — K6389 Other specified diseases of intestine: Secondary | ICD-10-CM | POA: Diagnosis not present

## 2014-03-16 DIAGNOSIS — F329 Major depressive disorder, single episode, unspecified: Secondary | ICD-10-CM | POA: Insufficient documentation

## 2014-03-16 DIAGNOSIS — K219 Gastro-esophageal reflux disease without esophagitis: Secondary | ICD-10-CM | POA: Insufficient documentation

## 2014-03-16 HISTORY — PX: COLONOSCOPY WITH PROPOFOL: SHX5780

## 2014-03-16 LAB — GLUCOSE, CAPILLARY: Glucose-Capillary: 127 mg/dL — ABNORMAL HIGH (ref 70–99)

## 2014-03-16 SURGERY — COLONOSCOPY WITH PROPOFOL
Anesthesia: Monitor Anesthesia Care

## 2014-03-16 MED ORDER — FENTANYL CITRATE 0.05 MG/ML IJ SOLN: 25.0000 ug | INTRAMUSCULAR | Status: DC | PRN

## 2014-03-16 MED ORDER — MIDAZOLAM HCL 5 MG/5ML IJ SOLN
INTRAMUSCULAR | Status: DC | PRN
  Administered 2014-03-16 (×2): 1 mg via INTRAVENOUS

## 2014-03-16 MED ORDER — STERILE WATER FOR IRRIGATION IR SOLN
Status: DC | PRN
  Administered 2014-03-16: 1000 mL

## 2014-03-16 MED ORDER — LACTATED RINGERS IV SOLN
INTRAVENOUS | Status: DC | PRN
  Administered 2014-03-16: 08:00:00 via INTRAVENOUS

## 2014-03-16 MED ORDER — ONDANSETRON HCL 4 MG/2ML IJ SOLN
INTRAMUSCULAR | Status: AC
  Filled 2014-03-16: qty 2

## 2014-03-16 MED ORDER — FENTANYL CITRATE 0.05 MG/ML IJ SOLN
INTRAMUSCULAR | Status: DC | PRN
  Administered 2014-03-16 (×2): 25 ug via INTRAVENOUS

## 2014-03-16 MED ORDER — PROPOFOL INFUSION 10 MG/ML OPTIME
INTRAVENOUS | Status: DC | PRN
  Administered 2014-03-16: 100 ug/kg/min via INTRAVENOUS

## 2014-03-16 MED ORDER — ONDANSETRON HCL 4 MG/2ML IJ SOLN
4.0000 mg | Freq: Once | INTRAMUSCULAR | Status: AC
  Administered 2014-03-16: 4 mg via INTRAVENOUS

## 2014-03-16 MED ORDER — GLYCOPYRROLATE 0.2 MG/ML IJ SOLN
0.2000 mg | Freq: Once | INTRAMUSCULAR | Status: AC
  Administered 2014-03-16: 0.2 mg via INTRAVENOUS

## 2014-03-16 MED ORDER — PROPOFOL 10 MG/ML IV BOLUS
INTRAVENOUS | Status: AC
  Filled 2014-03-16: qty 20

## 2014-03-16 MED ORDER — PROPOFOL 10 MG/ML IV BOLUS
INTRAVENOUS | Status: DC | PRN
  Administered 2014-03-16: 50 mg via INTRAVENOUS
  Administered 2014-03-16: 100 mg via INTRAVENOUS

## 2014-03-16 MED ORDER — FENTANYL CITRATE 0.05 MG/ML IJ SOLN
INTRAMUSCULAR | Status: AC
  Filled 2014-03-16: qty 2

## 2014-03-16 MED ORDER — FENTANYL CITRATE 0.05 MG/ML IJ SOLN
25.0000 ug | INTRAMUSCULAR | Status: AC
  Administered 2014-03-16 (×2): 25 ug via INTRAVENOUS

## 2014-03-16 MED ORDER — ONDANSETRON HCL 4 MG/2ML IJ SOLN: 4.0000 mg | Freq: Once | INTRAMUSCULAR | Status: DC | PRN

## 2014-03-16 MED ORDER — GLYCOPYRROLATE 0.2 MG/ML IJ SOLN
INTRAMUSCULAR | Status: AC
  Filled 2014-03-16: qty 1

## 2014-03-16 MED ORDER — MIDAZOLAM HCL 2 MG/2ML IJ SOLN
1.0000 mg | INTRAMUSCULAR | Status: DC | PRN
  Administered 2014-03-16 (×2): 2 mg via INTRAVENOUS
  Filled 2014-03-16: qty 2

## 2014-03-16 MED ORDER — MIDAZOLAM HCL 2 MG/2ML IJ SOLN
INTRAMUSCULAR | Status: AC
  Filled 2014-03-16: qty 2

## 2014-03-16 MED ORDER — LACTATED RINGERS IV SOLN
INTRAVENOUS | Status: DC
  Administered 2014-03-16: 1000 mL via INTRAVENOUS

## 2014-03-16 SURGICAL SUPPLY — 9 items
FLOOR PAD 36X40 (MISCELLANEOUS) ×2
KIT CLEAN ENDO COMPLIANCE (KITS) ×2 IMPLANT
LUBRICANT JELLY 4.5OZ STERILE (MISCELLANEOUS) ×2 IMPLANT
MANIFOLD NEPTUNE II (INSTRUMENTS) ×2 IMPLANT
OVERTUBE ENDOCUFF GREEN (MISCELLANEOUS) ×2 IMPLANT
PAD FLOOR 36X40 (MISCELLANEOUS) ×1 IMPLANT
SYR 50ML LL SCALE MARK (SYRINGE) ×2 IMPLANT
TUBING IRRIGATION ENDOGATOR (MISCELLANEOUS) ×2 IMPLANT
WATER STERILE IRR 1000ML POUR (IV SOLUTION) ×2 IMPLANT

## 2014-03-16 NOTE — Addendum Note (Signed)
Addendum  created 03/16/14 1244 by Mickel Baas, CRNA   Modules edited: Charges VN

## 2014-03-16 NOTE — Transfer of Care (Signed)
Immediate Anesthesia Transfer of Care Note  Patient: April Ward  Procedure(s) Performed: Procedure(s): COLONOSCOPY WITH PROPOFOL (at cecum 1023, total withdrawal time=9 minutes) (N/A)  Patient Location: PACU  Anesthesia Type:MAC  Level of Consciousness: awake, alert , oriented and patient cooperative  Airway & Oxygen Therapy: Patient Spontanous Breathing and Patient connected to nasal cannula oxygen  Post-op Assessment: Report given to PACU RN, Post -op Vital signs reviewed and stable and Patient moving all extremities  Post vital signs: Reviewed and stable  Complications: No apparent anesthesia complications

## 2014-03-16 NOTE — Anesthesia Preprocedure Evaluation (Signed)
Anesthesia Evaluation  Patient identified by MRN, date of birth, ID band Patient awake    Reviewed: Allergy & Precautions, H&P , NPO status , Patient's Chart, lab work & pertinent test results, reviewed documented beta blocker date and time , Unable to perform ROS - Chart review only  Airway Mallampati: II  TM Distance: >3 FB Neck ROM: Full    Dental  (+) Teeth Intact   Pulmonary neg pulmonary ROS,  breath sounds clear to auscultation        Cardiovascular hypertension, Pt. on medications and Pt. on home beta blockers Rhythm:Regular Rate:Normal     Neuro/Psych PSYCHIATRIC DISORDERS Anxiety Depression    GI/Hepatic GERD-  Medicated and Controlled,  Endo/Other  diabetes, Well Controlled, Type 2, Oral Hypoglycemic AgentsMorbid obesity  Renal/GU Renal InsufficiencyRenal disease     Musculoskeletal  (+) Arthritis -,   Abdominal   Peds  Hematology   Anesthesia Other Findings   Reproductive/Obstetrics                             Anesthesia Physical Anesthesia Plan  ASA: III  Anesthesia Plan: MAC   Post-op Pain Management:    Induction: Intravenous  Airway Management Planned: Simple Face Mask  Additional Equipment:   Intra-op Plan:   Post-operative Plan:   Informed Consent: I have reviewed the patients History and Physical, chart, labs and discussed the procedure including the risks, benefits and alternatives for the proposed anesthesia with the patient or authorized representative who has indicated his/her understanding and acceptance.     Plan Discussed with:   Anesthesia Plan Comments:         Anesthesia Quick Evaluation

## 2014-03-16 NOTE — Anesthesia Postprocedure Evaluation (Signed)
  Anesthesia Post-op Note  Patient: April Ward  Procedure(s) Performed: Procedure(s): COLONOSCOPY WITH PROPOFOL (at cecum 1023, total withdrawal time=9 minutes) (N/A)  Patient Location: PACU  Anesthesia Type:MAC  Level of Consciousness: awake, alert , oriented and patient cooperative  Airway and Oxygen Therapy: Patient Spontanous Breathing and Patient connected to nasal cannula oxygen  Post-op Pain: none  Post-op Assessment: Post-op Vital signs reviewed, Patient's Cardiovascular Status Stable, Respiratory Function Stable, Patent Airway, No signs of Nausea or vomiting and Pain level controlled  Post-op Vital Signs: Reviewed and stable  Last Vitals:  Filed Vitals:   03/16/14 1000  BP: 149/76  Temp:   Resp: 14    Complications: No apparent anesthesia complications

## 2014-03-16 NOTE — Anesthesia Procedure Notes (Signed)
Procedure Name: MAC Date/Time: 03/16/2014 10:02 AM Performed by: Michele Rockers Pre-anesthesia Checklist: Patient identified, Emergency Drugs available, Suction available, Timeout performed and Patient being monitored Patient Re-evaluated:Patient Re-evaluated prior to inductionOxygen Delivery Method: Non-rebreather mask

## 2014-03-16 NOTE — Discharge Instructions (Signed)
You have internal hemorrhoids. YOU DID NOT HAVE ANY POLYPS. ° ° °FOLLOW A HIGH FIBER DIET. AVOID ITEMS THAT CAUSE BLOATING. SEE INFO BELOW. ° °Next colonoscopy in 10 years. ° ° °Colonoscopy °Care After °Read the instructions outlined below and refer to this sheet in the next week. These discharge instructions provide you with general information on caring for yourself after you leave the hospital. While your treatment has been planned according to the most current medical practices available, unavoidable complications occasionally occur. If you have any problems or questions after discharge, call DR. Laryn Venning, [phone]. ° °ACTIVITY °· You may resume your regular activity, but move at a slower pace for the next 24 hours.  °· Take frequent rest periods for the next 24 hours.  °· Walking will help get rid of the air and reduce the bloated feeling in your belly (abdomen).  °· No driving for 24 hours (because of the medicine (anesthesia) used during the test).  °· You may shower.  °· Do not sign any important legal documents or operate any machinery for 24 hours (because of the anesthesia used during the test).  °·  °NUTRITION °· Drink plenty of fluids.  °· You may resume your normal diet as instructed by your doctor.  °· Begin with a light meal and progress to your normal diet. Heavy or fried foods are harder to digest and may make you feel sick to your stomach (nauseated).  °· Avoid alcoholic beverages for 24 hours or as instructed.  °·  °MEDICATIONS °· You may resume your normal medications. °·  °WHAT YOU CAN EXPECT TODAY °· Some feelings of bloating in the abdomen.  °· Passage of more gas than usual.  °· Spotting of blood in your stool or on the toilet paper °· .  °IF YOU HAD POLYPS REMOVED DURING THE COLONOSCOPY: °· Eat a soft diet IF YOU HAVE NAUSEA, BLOATING, ABDOMINAL PAIN, OR VOMITING. °·   °FINDING OUT THE RESULTS OF YOUR TEST °Not all test results are available during your visit. DR. Thien Berka WILL CALL YOU  WITHIN 7 DAYS OF YOUR PROCEDUE WITH YOUR RESULTS. Do not assume everything is normal if you have not heard from DR. Jull Harral IN ONE WEEK, CALL HER OFFICE AT [phone]. ° °SEEK IMMEDIATE MEDICAL ATTENTION AND CALL THE OFFICE: [phone] IF: °· You have more than a spotting of blood in your stool.  °· Your belly is swollen (abdominal distention).  °· You are nauseated or vomiting.  °· You have a temperature over 101F.  °· You have abdominal pain or discomfort that is severe or gets worse throughout the day. ° °High-Fiber Diet °A high-fiber diet changes your normal diet to include more whole grains, legumes, fruits, and vegetables. Changes in the diet involve replacing refined carbohydrates with unrefined foods. The calorie level of the diet is essentially unchanged. The Dietary Reference Intake (recommended amount) for adult males is 38 grams per day. For adult females, it is 25 grams per day. Pregnant and lactating women should consume 28 grams of fiber per day. °Fiber is the intact part of a plant that is not broken down during digestion. Functional fiber is fiber that has been isolated from the plant to provide a beneficial effect in the body. °PURPOSE °· Increase stool bulk.  °· Ease and regulate bowel movements.  °· Lower cholesterol.  °INDICATIONS THAT YOU NEED MORE FIBER °· Constipation and hemorrhoids.  °· Uncomplicated diverticulosis (intestine condition) and irritable bowel syndrome.  °· Weight management.  °· As   a protective measure against hardening of the arteries (atherosclerosis), diabetes, and cancer.  ° °GUIDELINES FOR INCREASING FIBER IN THE DIET °· Start adding fiber to the diet slowly. A gradual increase of about 5 more grams (2 slices of whole-wheat bread, 2 servings of most fruits or vegetables, or 1 bowl of high-fiber cereal) per day is best. Too rapid an increase in fiber may result in constipation, flatulence, and bloating.  °· Drink enough water and fluids to keep your urine clear or pale  yellow. Water, juice, or caffeine-free drinks are recommended. Not drinking enough fluid may cause constipation.  °· Eat a variety of high-fiber foods rather than one type of fiber.  °· Try to increase your intake of fiber through using high-fiber foods rather than fiber pills or supplements that contain small amounts of fiber.  °· The goal is to change the types of food eaten. Do not supplement your present diet with high-fiber foods, but replace foods in your present diet.  °INCLUDE A VARIETY OF FIBER SOURCES °· Replace refined and processed grains with whole grains, canned fruits with fresh fruits, and incorporate other fiber sources. White rice, white breads, and most bakery goods contain little or no fiber.  °· Brown whole-grain rice, buckwheat oats, and many fruits and vegetables are all good sources of fiber. These include: broccoli, Brussels sprouts, cabbage, cauliflower, beets, sweet potatoes, white potatoes (skin on), carrots, tomatoes, eggplant, squash, berries, fresh fruits, and dried fruits.  °· Cereals appear to be the richest source of fiber. Cereal fiber is found in whole grains and bran. Bran is the fiber-rich outer coat of cereal grain, which is largely removed in refining. In whole-grain cereals, the bran remains. In breakfast cereals, the largest amount of fiber is found in those with "bran" in their names. The fiber content is sometimes indicated on the label.  °· You may need to include additional fruits and vegetables each day.  °· In baking, for 1 cup white flour, you may use the following substitutions:  °· 1 cup whole-wheat flour minus 2 tablespoons.  °· 1/2 cup white flour plus 1/2 cup whole-wheat flour.  ° °Hemorrhoids °Hemorrhoids are dilated (enlarged) veins around the rectum. Sometimes clots will form in the veins. This makes them swollen and painful. These are called thrombosed hemorrhoids. °Causes of hemorrhoids include: °· Constipation.  °· Straining to have a bowel movement. °·   HEAVY LIFTING °HOME CARE INSTRUCTIONS °· Eat a well balanced diet and drink 6 to 8 glasses of water every day to avoid constipation. You may also use a bulk laxative.  °· Avoid straining to have bowel movements.  °· Keep anal area dry and clean.  °· Do not use a donut shaped pillow or sit on the toilet for long periods. This increases blood pooling and pain.  °· Move your bowels when your body has the urge; this will require less straining and will decrease pain and pressure.  ° °

## 2014-03-16 NOTE — H&P (Signed)
Primary Care Physician:  Maggie Font, MD Primary Gastroenterologist:  Dr. Oneida Alar  Pre-Procedure History & Physical: HPI:  April Ward is a 53 y.o. female here for [place].  Past Medical History  Diagnosis Date  . Hypertension   . Diabetes mellitus without complication   . Heart murmur   . Depression   . Anxiety   . GERD (gastroesophageal reflux disease)   . Arthritis   . Gout   . Renal disease 10/2012  . Renal insufficiency   . Renal cancer    Past Surgical History  Procedure Laterality Date  . Cesarean section  [id number]  . Knee arthroscopy with medial menisectomy Right 08/22/2012    Procedure: KNEE ARTHROSCOPY WITH PARTIAL MEDIAL MENISECTOMY;  Surgeon: Carole Civil, MD;  Location: [facility];  Service: Orthopedics;  Laterality: Right;  . Chondroplasty Right 08/22/2012    Procedure: CHONDROPLASTY;  Surgeon: Carole Civil, MD;  Location: [facility];  Service: Orthopedics;  Laterality: Right;  . Partial nephrectomy  Dec 2014    left   Prior to Admission medications   Medication Sig Start Date End Date Taking? Authorizing Provider  aspirin EC 81 MG tablet Take 1 tablet (81 mg total) by mouth daily. 02/18/14  Yes Arnoldo Lenis, MD  atorvastatin (LIPITOR) 40 MG tablet Take 1 tablet (40 mg total) by mouth daily. 02/18/14  Yes Arnoldo Lenis, MD  buPROPion (WELLBUTRIN XL) 150 MG 24 hr tablet Take 1 tablet (150 mg total) by mouth every morning. 03/15/14 03/15/15 Yes [name], MD  carbamazepine (TEGRETOL) 200 MG tablet Take 2 tablets (400 mg total) by mouth daily. 03/15/14  Yes [name], MD  cloNIDine (CATAPRES - DOSED IN MG/24 HR) 0.1 mg/24hr patch Place 1 patch (0.1 mg total) onto the skin once a week. 02/18/14  Yes Arnoldo Lenis, MD  colchicine 0.6 MG tablet Take 0.6 mg by mouth as needed (gout).  10/20/13  Yes Historical Provider, MD  diazepam (VALIUM) 10 MG tablet Take 1 tablet (10 mg total) by mouth 4 (four) times daily. 03/15/14  Yes [name], MD  fluticasone [facility]) 50 MCG/ACT nasal spray Place 2 sprays into both nostrils at bedtime. 01/12/14  Yes Waldemar Dickens, MD  furosemide (LASIX) 40 MG tablet Take 1 tablet (40 mg total) by mouth 2 (two) times daily. 09/23/13  Yes Arnoldo Lenis, MD  hydrALAZINE (APRESOLINE) 100 MG tablet Take 1 tablet (100 mg total) by mouth 3 (three) times daily. 09/23/13  Yes Arnoldo Lenis, MD  ipratropium (ATROVENT) 0.06 % nasal spray Place 2 sprays into both nostrils 4 (four) times daily. 01/12/14  Yes Waldemar Dickens, MD  labetalol (NORMODYNE) 200 MG tablet Take 2 tablets (400 mg total) by mouth 2 (two) times daily. 11/18/13  Yes Arnoldo Lenis, MD  linagliptin (TRADJENTA) 5 MG TABS tablet Take 5 mg by mouth daily.   Yes Historical Provider, MD  losartan (COZAAR) 50 MG tablet Take 75 mg by mouth daily.    Yes Historical Provider, MD  omeprazole (PRILOSEC) 20 MG capsule Take 20 mg by mouth daily.   Yes Historical Provider, MD  peg 3350 powder (MOVIPREP) 100 G SOLR Take 1 kit (200 g total) by mouth as directed. 12/17/13  Yes Danie Binder, MD  prazosin (MINIPRESS) 5 MG capsule Take 1 capsule (5 mg total) by mouth at bedtime. 03/15/14  Yes [name], MD  QUEtiapine (SEROQUEL) 50 MG tablet Take 1 tablet (50 mg total) by mouth  at bedtime. 03/15/14 03/15/15 Yes [name], MD  ULORIC 40 MG tablet Take 40 mg by mouth daily.  10/16/13  Yes Historical Provider, MD   Allergies as of 02/11/2014 - Review Complete 01/28/2014  Allergen Reaction Noted  . Skelaxin [metaxalone] Hives and Rash 07/13/2012   Family History  Problem Relation Age of Onset  . Heart attack Mother   . Heart attack Father   . Depression Paternal Aunt   . Alcohol abuse Maternal Uncle   . Colon cancer Maternal Aunt   . Colon cancer Maternal Uncle     History   Social History  . Marital Status: Married    Spouse Name: N/A    Number of Children: N/A  . Years of Education: N/A   Occupational History  . [employer]    Social History Main Topics  . Smoking status: Never Smoker   . Smokeless tobacco: Never Used  . Alcohol Use: No  . Drug Use: No  . Sexual Activity: Not on file   Other Topics Concern  . Not on file   Social History Narrative    Review of Systems: See HPI, otherwise negative ROS   Physical Exam: BP 132/70 mmHg  Temp(Src) 97.6 F (36.4 C) (Oral)  Resp 18  SpO2 92% General:   Alert,  pleasant and cooperative in NAD Head:  Normocephalic and atraumatic. Neck:  Supple; Lungs:  Clear throughout to auscultation.    Heart:  Regular rate and rhythm. Abdomen:  Soft, nontender and nondistended. Normal bowel sounds, without guarding, and without rebound.   Neurologic:  Alert and  oriented x4;  grossly normal neurologically.  Impression/Plan:    SCREENING  Plan:  1. TCS TODAY

## 2014-03-17 ENCOUNTER — Encounter (HOSPITAL_COMMUNITY): Payer: Self-pay | Admitting: Gastroenterology

## 2014-03-17 NOTE — Op Note (Signed)
[facility] [address] [place], [address]   COLONOSCOPY PROCEDURE REPORT  PATIENT: April Ward, April Ward  MR#: [id number] BIRTHDATE: 1960/07/02 , 53  yrs. old GENDER: female ENDOSCOPIST: Barney Drain, MD REFERRED WC:BJSEGB Hill, M.D. PROCEDURE DATE:  03/16/2014 PROCEDURE:   Colonoscopy, screening INDICATIONS:average risk for colon cancer. MEDICATIONS: Monitored anesthesia care  DESCRIPTION OF PROCEDURE:    Physical exam was performed.  Informed consent was obtained from the patient after explaining the benefits, risks, and alternatives to procedure.  The patient was connected to monitor and placed in left lateral position. Continuous oxygen was provided by nasal cannula and IV medicine administered through an indwelling cannula.  After administration of sedation and rectal exam, the patients rectum was intubated and the     colonoscope was advanced under direct visualization to the cecum.  The scope was removed slowly by carefully examining the color, texture, anatomy, and integrity mucosa on the way out.  The patient was recovered in endoscopy and discharged home in satisfactory condition.    COLON FINDINGS: The colon was redundant.  Manual abdominal counter-pressure was used to reach the cecum, The examination was otherwise normal.  , and Small internal hemorrhoids were found.  PREP QUALITY: good.  CECAL W/D TIME: 9 mins  COMPLICATIONS: None  ENDOSCOPIC IMPRESSION: 1.   The LEFT colon IS redundant 2.   The examination was otherwise normal 3.   Small internal hemorrhoids  RECOMMENDATIONS: HIGH FIBER DIET NEXT TCS IN 10 YEARS.  CONSIDER OVERTUBE IF PT BMI > 45.    _______________________________ eSignedBarney Drain, MD 04/13/14 4:17 PM    CPT CODES: ICD CODES:  The ICD and CPT codes recommended by this software are interpretations from the data that the clinical staff has captured with the software.  The verification of the translation of  this report to the ICD and CPT codes and modifiers is the sole responsibility of the health care institution and practicing physician where this report was generated.  [employer] will not be held responsible for the validity of the ICD and CPT codes included on this report.  AMA assumes no liability for data contained or not contained herein. CPT is a Designer, television/film set of the [employer].

## 2014-03-17 NOTE — Telephone Encounter (Signed)
REVIEWED-NO ADDITIONAL RECOMMENDATIONS. 

## 2014-03-18 ENCOUNTER — Encounter (HOSPITAL_COMMUNITY): Payer: Self-pay | Admitting: Emergency Medicine

## 2014-03-18 ENCOUNTER — Emergency Department (HOSPITAL_COMMUNITY)
Admission: EM | Admit: 2014-03-18 | Discharge: 2014-03-18 | Disposition: A | Discharge status: Home / Self Care | Payer: BC Managed Care – PPO | Attending: Emergency Medicine | Admitting: Emergency Medicine

## 2014-03-18 ENCOUNTER — Ambulatory Visit (INDEPENDENT_AMBULATORY_CARE_PROVIDER_SITE_OTHER): Payer: BC Managed Care – PPO | Admitting: Cardiology

## 2014-03-18 ENCOUNTER — Encounter: Payer: Self-pay | Admitting: Cardiology

## 2014-03-18 VITALS — BP 148/77 | HR 59 | Ht 71.0 in | Wt 301.0 lb

## 2014-03-18 DIAGNOSIS — Z85528 Personal history of other malignant neoplasm of kidney: Secondary | ICD-10-CM | POA: Diagnosis not present

## 2014-03-18 DIAGNOSIS — X58XXXA Exposure to other specified factors, initial encounter: Secondary | ICD-10-CM | POA: Insufficient documentation

## 2014-03-18 DIAGNOSIS — S39012A Strain of muscle, fascia and tendon of lower back, initial encounter: Secondary | ICD-10-CM

## 2014-03-18 DIAGNOSIS — Z7951 Long term (current) use of inhaled steroids: Secondary | ICD-10-CM | POA: Insufficient documentation

## 2014-03-18 DIAGNOSIS — I10 Essential (primary) hypertension: Secondary | ICD-10-CM | POA: Diagnosis not present

## 2014-03-18 DIAGNOSIS — F419 Anxiety disorder, unspecified: Secondary | ICD-10-CM | POA: Insufficient documentation

## 2014-03-18 DIAGNOSIS — Z79899 Other long term (current) drug therapy: Secondary | ICD-10-CM | POA: Diagnosis not present

## 2014-03-18 DIAGNOSIS — M1990 Unspecified osteoarthritis, unspecified site: Secondary | ICD-10-CM | POA: Insufficient documentation

## 2014-03-18 DIAGNOSIS — Y998 Other external cause status: Secondary | ICD-10-CM | POA: Insufficient documentation

## 2014-03-18 DIAGNOSIS — E119 Type 2 diabetes mellitus without complications: Secondary | ICD-10-CM | POA: Diagnosis not present

## 2014-03-18 DIAGNOSIS — Y9289 Other specified places as the place of occurrence of the external cause: Secondary | ICD-10-CM | POA: Diagnosis not present

## 2014-03-18 DIAGNOSIS — F329 Major depressive disorder, single episode, unspecified: Secondary | ICD-10-CM | POA: Insufficient documentation

## 2014-03-18 DIAGNOSIS — Y9389 Activity, other specified: Secondary | ICD-10-CM | POA: Insufficient documentation

## 2014-03-18 DIAGNOSIS — K219 Gastro-esophageal reflux disease without esophagitis: Secondary | ICD-10-CM | POA: Diagnosis not present

## 2014-03-18 DIAGNOSIS — R011 Cardiac murmur, unspecified: Secondary | ICD-10-CM | POA: Diagnosis not present

## 2014-03-18 DIAGNOSIS — S3992XA Unspecified injury of lower back, initial encounter: Secondary | ICD-10-CM | POA: Diagnosis present

## 2014-03-18 DIAGNOSIS — Z7982 Long term (current) use of aspirin: Secondary | ICD-10-CM | POA: Diagnosis not present

## 2014-03-18 DIAGNOSIS — Z87448 Personal history of other diseases of urinary system: Secondary | ICD-10-CM | POA: Diagnosis not present

## 2014-03-18 MED ORDER — OXYCODONE-ACETAMINOPHEN 5-325 MG PO TABS
1.0000 | ORAL_TABLET | Freq: Once | ORAL | Status: AC
  Administered 2014-03-18: 1 via ORAL
  Filled 2014-03-18: qty 1

## 2014-03-18 MED ORDER — OXYCODONE-ACETAMINOPHEN 5-325 MG PO TABS: 1.0000 | ORAL_TABLET | ORAL | Status: DC | PRN

## 2014-03-18 MED ORDER — CYCLOBENZAPRINE HCL 5 MG PO TABS: 5.0000 mg | ORAL_TABLET | Freq: Three times a day (TID) | ORAL | Status: DC | PRN

## 2014-03-18 MED ORDER — CYCLOBENZAPRINE HCL 10 MG PO TABS
5.0000 mg | ORAL_TABLET | Freq: Once | ORAL | Status: AC
  Administered 2014-03-18: 5 mg via ORAL
  Filled 2014-03-18: qty 1

## 2014-03-18 NOTE — ED Notes (Signed)
Patient c/o low back pain with back spasms. Denies any known injury. Patient states "The only thing I can think of is using the leaf blower." Patient denies any difficulty with Urination or BMs. Denies any urinary symptoms. Per patient taking tylenol with no relief.

## 2014-03-18 NOTE — Progress Notes (Signed)
Clinical Summary [name] is a 53 y.o.female seen today for follow up of the following medical problems.   1. HTN  - reports diagnosed with HTN age 84.  - has been difficult to control. Last visit started clonidine patch, since that time signficiant improvement in pressures. Typically running around 130-140s/60-70s. She does not some lightheadness/dizziness at times after taking all her meds at once in the AM.  - CT scan in 2006 with normal renal arteries. TSH 1.80. No EtoH. Normal renin and aldo ratio. Normal sleep study 10/2013 at [facility]    Past Medical History  Diagnosis Date  . Hypertension   . Diabetes mellitus without complication   . Heart murmur   . Depression   . Anxiety   . GERD (gastroesophageal reflux disease)   . Arthritis   . Gout   . Renal disease 10/2012  . Renal insufficiency   . Renal cancer      Allergies  Allergen Reactions  . Skelaxin [Metaxalone] Hives and Rash     Current Outpatient Prescriptions  Medication Sig Dispense Refill  . aspirin EC 81 MG tablet Take 1 tablet (81 mg total) by mouth daily.    Marland Kitchen atorvastatin (LIPITOR) 40 MG tablet Take 1 tablet (40 mg total) by mouth daily. 30 tablet 6  . buPROPion (WELLBUTRIN XL) 150 MG 24 hr tablet Take 1 tablet (150 mg total) by mouth every morning. 30 tablet 2  . carbamazepine (TEGRETOL) 200 MG tablet Take 2 tablets (400 mg total) by mouth daily. 60 tablet 2  . cloNIDine (CATAPRES - DOSED IN MG/24 HR) 0.1 mg/24hr patch Place 1 patch (0.1 mg total) onto the skin once a week. 4 patch 6  . colchicine 0.6 MG tablet Take 0.6 mg by mouth as needed (gout).     . diazepam (VALIUM) 10 MG tablet Take 1 tablet (10 mg total) by mouth 4 (four) times daily. 120 tablet 2  . fluticasone (FLONASE) 50 MCG/ACT nasal spray Place 2 sprays into both nostrils at bedtime. 16 g 0  . furosemide (LASIX) 40 MG tablet Take 1 tablet (40 mg total) by mouth 2 (two) times daily.    . hydrALAZINE (APRESOLINE) 100 MG tablet Take 1  tablet (100 mg total) by mouth 3 (three) times daily.    Marland Kitchen ipratropium (ATROVENT) 0.06 % nasal spray Place 2 sprays into both nostrils 4 (four) times daily. 15 mL 12  . labetalol (NORMODYNE) 200 MG tablet Take 2 tablets (400 mg total) by mouth 2 (two) times daily. 270 tablet 6  . linagliptin (TRADJENTA) 5 MG TABS tablet Take 5 mg by mouth daily.    Marland Kitchen losartan (COZAAR) 50 MG tablet Take 75 mg by mouth daily.     Marland Kitchen omeprazole (PRILOSEC) 20 MG capsule Take 20 mg by mouth daily.    . prazosin (MINIPRESS) 5 MG capsule Take 1 capsule (5 mg total) by mouth at bedtime. 30 capsule 2  . QUEtiapine (SEROQUEL) 50 MG tablet Take 1 tablet (50 mg total) by mouth at bedtime. 30 tablet 2  . ULORIC 40 MG tablet Take 40 mg by mouth daily.      No current facility-administered medications for this visit.     Past Surgical History  Procedure Laterality Date  . Cesarean section  [id number]  . Knee arthroscopy with medial menisectomy Right 08/22/2012    Procedure: KNEE ARTHROSCOPY WITH PARTIAL MEDIAL MENISECTOMY;  Surgeon: Carole Civil, MD;  Location: [facility];  Service: Orthopedics;  Laterality:  Right;  . Chondroplasty Right 08/22/2012    Procedure: CHONDROPLASTY;  Surgeon: Carole Civil, MD;  Location: [facility];  Service: Orthopedics;  Laterality: Right;  . Partial nephrectomy  Dec 2014    left  . Colonoscopy with propofol N/A 03/16/2014    Procedure: COLONOSCOPY WITH PROPOFOL (at cecum 1023, total withdrawal time=9 minutes);  Surgeon: Danie Binder, MD;  Location: [facility];  Service: Endoscopy;  Laterality: N/A;     Allergies  Allergen Reactions  . Skelaxin [Metaxalone] Hives and Rash      Family History  Problem Relation Age of Onset  . Heart attack Mother   . Heart attack Father   . Depression Paternal Aunt   . Alcohol abuse Maternal Uncle   . Colon cancer Maternal Aunt   . Colon cancer Maternal Uncle      Social History [name] reports that she has never smoked. She has never used  smokeless tobacco. [name] reports that she does not drink alcohol.   Review of Systems CONSTITUTIONAL: No weight loss, fever, chills, weakness or fatigue.  HEENT: Eyes: No visual loss, blurred vision, double vision or yellow sclerae.No hearing loss, sneezing, congestion, runny nose or sore throat.  SKIN: No rash or itching.  CARDIOVASCULAR: per HPI RESPIRATORY: No shortness of breath, cough or sputum.  GASTROINTESTINAL: No anorexia, nausea, vomiting or diarrhea. No abdominal pain or blood.  GENITOURINARY: No burning on urination, no polyuria NEUROLOGICAL: No headache, dizziness, syncope, paralysis, ataxia, numbness or tingling in the extremities. No change in bowel or bladder control.  MUSCULOSKELETAL: No muscle, back pain, joint pain or stiffness.  LYMPHATICS: No enlarged nodes. No history of splenectomy.  PSYCHIATRIC: No history of depression or anxiety.  ENDOCRINOLOGIC: No reports of sweating, cold or heat intolerance. No polyuria or polydipsia.  Marland Kitchen   Physical Examination p 59 bp 148/77 Wt 301 lbs bMI 42 Gen: resting comfortably, no acute distress HEENT: no scleral icterus, pupils equal round and reactive, no palptable cervical adenopathy,  CV: RRR, no m/r/g, no JVD, no carotid bruits Resp: Clear to auscultation bilaterally GI: abdomen is soft, non-tender, non-distended, normal bowel sounds, no hepatosplenomegaly MSK: extremities are warm, no edema.  Skin: warm, no rash Neuro:  no focal deficits Psych: appropriate affect   Diagnostic Studies 08/2013 Labs  TC 192 TG 231 LDL HDL 37 LDL 109  K 4.0 Cr 2 BUN 21 GFR 30 TSH 1.80  09/2013 Carotid US 1-39% bilateral ICA disease  09/2013 Exercise MPI Overall low risk exercise/Lexiscan Cardiolite. Patient had limited exercise capacity achieving maximum work load of 7 METS, limited by shortness of breath and hypertensive response. There were no clearly diagnostic ST segment abnormalities. Occasional to frequent PVCs  and ventricular couplets were noted early in exercise. There were no sustained arrhythmias. Perfusion imaging shows probable variable soft tissue attenuation, less likely a minor degree of basal lateral ischemia. LVEF is normal at 61% with upper normal chamber volume and no obvious wall motion abnormalities.    Assessment and Plan  1. Resistant HTN  - difficult to control HTN, have not found evidence of secondary HTN - CT 2006 without renal artery stenosis, normal TSH, no EtoH. Normal renin to aldo ratio. Recent negative sleep study - despite resistant HTN, avoid aldactone due to renal function - significant improvement after starting clonidine patch last visit, does note some fatigue and dizziness often 1 hr after taking all her meds in AM. Counseled to try taking her labetalol in the early afternoon as opposed  to AM and follow symptoms - she will keep bp log and submit in 2 weeks.     F/u 4 months     Arnoldo Lenis, M.D.

## 2014-03-18 NOTE — ED Provider Notes (Signed)
CSN: [id number]     Arrival date & time 03/18/14  1109 History   First MD Initiated Contact with Patient 03/18/14 1204     Chief Complaint  Patient presents with  . Back Pain     (Consider location/radiation/quality/duration/timing/severity/associated sxs/prior Treatment) The history is provided by the patient.   April Ward is a 53 y.o. female with known degenerative disc disease in her lumbar spine presenting with right lower lumbar pain which she describes as tight muscle spasm which started shortly after using a leaf blower 5 days ago.  She has had persistent pain at the site despite using Tylenol and rest.  Pain is worse with movement.  She denies radiation of pain into her lower extremities and has had no weakness or numbness in the extremities.  She denies urinary retention or incontinence.  Of note, she was seen by her nephrologist yesterday for monitoring of her chronic renal insufficiency.  She states her urinalysis yesterday was completely normal.  She denies hematuria, no history of kidney stones.     Past Medical History  Diagnosis Date  . Hypertension   . Diabetes mellitus without complication   . Heart murmur   . Depression   . Anxiety   . GERD (gastroesophageal reflux disease)   . Arthritis   . Gout   . Renal disease 10/2012  . Renal insufficiency   . Renal cancer    Past Surgical History  Procedure Laterality Date  . Cesarean section  [id number]  . Knee arthroscopy with medial menisectomy Right 08/22/2012    Procedure: KNEE ARTHROSCOPY WITH PARTIAL MEDIAL MENISECTOMY;  Surgeon: Carole Civil, MD;  Location: [facility];  Service: Orthopedics;  Laterality: Right;  . Chondroplasty Right 08/22/2012    Procedure: CHONDROPLASTY;  Surgeon: Carole Civil, MD;  Location: [facility];  Service: Orthopedics;  Laterality: Right;  . Partial nephrectomy  Dec 2014    left  . Colonoscopy with propofol N/A 03/16/2014    Procedure: COLONOSCOPY WITH PROPOFOL (at cecum 1023, total  withdrawal time=9 minutes);  Surgeon: Danie Binder, MD;  Location: [facility];  Service: Endoscopy;  Laterality: N/A;   Family History  Problem Relation Age of Onset  . Heart attack Mother   . Heart attack Father   . Depression Paternal Aunt   . Alcohol abuse Maternal Uncle   . Colon cancer Maternal Aunt   . Colon cancer Maternal Uncle    History  Substance Use Topics  . Smoking status: Never Smoker   . Smokeless tobacco: Never Used  . Alcohol Use: No   OB History    Gravida Para Term Preterm AB TAB SAB Ectopic Multiple Living   2 2 2       2      Review of Systems  Constitutional: Negative for fever.  Respiratory: Negative for shortness of breath.   Cardiovascular: Negative for chest pain and leg swelling.  Gastrointestinal: Negative for abdominal pain, constipation and abdominal distention.  Genitourinary: Negative for dysuria, urgency, frequency, flank pain and difficulty urinating.  Musculoskeletal: Positive for back pain. Negative for joint swelling and gait problem.  Skin: Negative for rash.  Neurological: Negative for weakness and numbness.      Allergies  Skelaxin  Home Medications   Prior to Admission medications   Medication Sig Start Date End Date Taking? Authorizing Provider  aspirin EC 81 MG tablet Take 1 tablet (81 mg total) by mouth daily. 02/18/14  Yes Arnoldo Lenis, MD  atorvastatin (LIPITOR) 40  MG tablet Take 1 tablet (40 mg total) by mouth daily. 02/18/14  Yes Arnoldo Lenis, MD  buPROPion (WELLBUTRIN XL) 150 MG 24 hr tablet Take 1 tablet (150 mg total) by mouth every morning. 03/15/14 03/15/15 Yes [name], MD  carbamazepine (TEGRETOL) 200 MG tablet Take 2 tablets (400 mg total) by mouth daily. 03/15/14  Yes [name], MD  cloNIDine (CATAPRES - DOSED IN MG/24 HR) 0.1 mg/24hr patch Place 1 patch (0.1 mg total) onto the skin once a week. 02/18/14  Yes Arnoldo Lenis, MD  colchicine 0.6 MG tablet Take 0.6 mg by mouth as needed (gout).   10/20/13  Yes Historical Provider, MD  diazepam (VALIUM) 10 MG tablet Take 1 tablet (10 mg total) by mouth 4 (four) times daily. 03/15/14  Yes [name], MD  fluticasone [facility]) 50 MCG/ACT nasal spray Place 2 sprays into both nostrils at bedtime. 01/12/14  Yes Waldemar Dickens, MD  furosemide (LASIX) 40 MG tablet Take 1 tablet (40 mg total) by mouth 2 (two) times daily. 09/23/13  Yes Arnoldo Lenis, MD  hydrALAZINE (APRESOLINE) 100 MG tablet Take 1 tablet (100 mg total) by mouth 3 (three) times daily. 09/23/13  Yes Arnoldo Lenis, MD  ipratropium (ATROVENT) 0.06 % nasal spray Place 2 sprays into both nostrils 4 (four) times daily. 01/12/14  Yes Waldemar Dickens, MD  labetalol (NORMODYNE) 200 MG tablet Take 2 tablets (400 mg total) by mouth 2 (two) times daily. 11/18/13  Yes Arnoldo Lenis, MD  linagliptin (TRADJENTA) 5 MG TABS tablet Take 5 mg by mouth daily.   Yes Historical Provider, MD  losartan (COZAAR) 50 MG tablet Take 75 mg by mouth daily.    Yes Historical Provider, MD  omeprazole (PRILOSEC) 20 MG capsule Take 20 mg by mouth daily.   Yes Historical Provider, MD  prazosin (MINIPRESS) 5 MG capsule Take 1 capsule (5 mg total) by mouth at bedtime. 03/15/14  Yes [name], MD  QUEtiapine (SEROQUEL) 50 MG tablet Take 1 tablet (50 mg total) by mouth at bedtime. 03/15/14 03/15/15 Yes [name], MD  ULORIC 40 MG tablet Take 40 mg by mouth daily.  10/16/13  Yes Historical Provider, MD  cyclobenzaprine (FLEXERIL) 5 MG tablet Take 1 tablet (5 mg total) by mouth 3 (three) times daily as needed for muscle spasms. 03/18/14   Evalee Jefferson, PA-C  oxyCODONE-acetaminophen (PERCOCET/ROXICET) 5-325 MG per tablet Take 1 tablet by mouth every 4 (four) hours as needed. 03/18/14   Evalee Jefferson, PA-C   BP 163/66 mmHg  Pulse 60  Temp(Src) 98 F (36.7 C) (Oral)  Resp 16  Ht 5' 11.5" (1.816 m)  Wt 299 lb (135.626 kg)  BMI 41.13 kg/m2  SpO2 100% Physical Exam  Constitutional: She appears well-developed  and well-nourished.  HENT:  Head: Normocephalic.  Eyes: Conjunctivae are normal.  Neck: Normal range of motion. Neck supple.  Cardiovascular: Normal rate and intact distal pulses.   Pedal pulses normal.  Pulmonary/Chest: Effort normal.  Abdominal: Soft. Bowel sounds are normal. She exhibits no distension and no mass.  Musculoskeletal: Normal range of motion. She exhibits no edema.       Lumbar back: She exhibits tenderness. She exhibits no bony tenderness, no swelling, no edema and no spasm.  No midline lumbar pain.  She is tender along her right paralumbar region.  No CVA tenderness.  Neurological: She is alert. She has normal strength. She displays no atrophy and no tremor. No sensory deficit. Gait normal.  Reflex Scores:      Patellar reflexes are 2+ on the right side and 2+ on the left side.      Achilles reflexes are 2+ on the right side and 2+ on the left side. No strength deficit noted in hip and knee flexor and extensor muscle groups.  Ankle flexion and extension intact.  Skin: Skin is warm and dry.  Psychiatric: She has a normal mood and affect.  Nursing note and vitals reviewed.   ED Course  Procedures (including critical care time) Labs Review Labs Reviewed - No data to display  Imaging Review No results found.   EKG Interpretation None      MDM   Final diagnoses:  Strain of lumbar paraspinal muscle, initial encounter    PT prescribed percocet and flexeril, encouraged heat tx.  Has appt with Dr Aline Brochure in 5 days for tx of left olecranon bursitis.  Encouraged f/u with him if back sx not improved by then.    Evalee Jefferson, PA-C 03/18/14 [place], MD 03/18/14 1600

## 2014-03-18 NOTE — Patient Instructions (Signed)
Your physician has requested that you regularly monitor and record your blood pressure readings at home. Please take your readings approximately 2 hours after medication.  Take 3-4 x per week for 2 weeks and return to MD for review.   Continue all current medications. Follow up in  4 months

## 2014-03-18 NOTE — Discharge Instructions (Signed)
Lumbosacral Strain Lumbosacral strain is a strain of any of the parts that make up your lumbosacral vertebrae. Your lumbosacral vertebrae are the bones that make up the lower third of your backbone. Your lumbosacral vertebrae are held together by muscles and tough, fibrous tissue (ligaments).  CAUSES  A sudden blow to your back can cause lumbosacral strain. Also, anything that causes an excessive stretch of the muscles in the low back can cause this strain. This is typically seen when people exert themselves strenuously, fall, lift heavy objects, bend, or crouch repeatedly. RISK FACTORS  Physically demanding work.  Participation in pushing or pulling sports or sports that require a sudden twist of the back (tennis, golf, baseball).  Weight lifting.  Excessive lower back curvature.  Forward-tilted pelvis.  Weak back or abdominal muscles or both.  Tight hamstrings. SIGNS AND SYMPTOMS  Lumbosacral strain may cause pain in the area of your injury or pain that moves (radiates) down your leg.  DIAGNOSIS Your health care provider can often diagnose lumbosacral strain through a physical exam. In some cases, you may need tests such as X-ray exams.  TREATMENT  Treatment for your lower back injury depends on many factors that your clinician will have to evaluate. However, most treatment will include the use of anti-inflammatory medicines. HOME CARE INSTRUCTIONS   Avoid hard physical activities (tennis, racquetball, waterskiing) if you are not in proper physical condition for it. This may aggravate or create problems.  If you have a back problem, avoid sports requiring sudden body movements. Swimming and walking are generally safer activities.  Maintain good posture.  Maintain a healthy weight.  For acute conditions, you may put ice on the injured area.  Put ice in a plastic bag.  Place a towel between your skin and the bag.  Leave the ice on for 20 minutes, 2-3 times a day.  When the  low back starts healing, stretching and strengthening exercises may be recommended. SEEK MEDICAL CARE IF:  Your back pain is getting worse.  You experience severe back pain not relieved with medicines. SEEK IMMEDIATE MEDICAL CARE IF:   You have numbness, tingling, weakness, or problems with the use of your arms or legs.  There is a change in bowel or bladder control.  You have increasing pain in any area of the body, including your belly (abdomen).  You notice shortness of breath, dizziness, or feel faint.  You feel sick to your stomach (nauseous), are throwing up (vomiting), or become sweaty.  You notice discoloration of your toes or legs, or your feet get very cold. MAKE SURE YOU:   Understand these instructions.  Will watch your condition.  Will get help right away if you are not doing well or get worse. Document Released: 12/27/2004 Document Revised: 03/24/2013 Document Reviewed: 11/05/2012 [facility] Patient Information 2015 [place], [place]. This information is not intended to replace advice given to you by your health care provider. Make sure you discuss any questions you have with your health care provider.    Do not drive within 4 hours of taking oxycodone or Flexeril as this will make you drowsy.  Avoid lifting,  Bending,  Twisting or any other activity that worsens your pain over the next week.  Apply a heating pad to your lower back for 20 minutes 3-4 times daily.  You should get rechecked if your symptoms are not better over the next 5 days, or you develop increased pain,  Weakness in your leg(s) or loss of bladder or bowel  function - these are symptoms of a worse injury.

## 2014-03-23 ENCOUNTER — Ambulatory Visit (INDEPENDENT_AMBULATORY_CARE_PROVIDER_SITE_OTHER): Payer: BC Managed Care – PPO | Admitting: Psychiatry

## 2014-03-23 ENCOUNTER — Encounter: Payer: Self-pay | Admitting: Orthopedic Surgery

## 2014-03-23 ENCOUNTER — Ambulatory Visit (INDEPENDENT_AMBULATORY_CARE_PROVIDER_SITE_OTHER): Payer: BC Managed Care – PPO | Admitting: Orthopedic Surgery

## 2014-03-23 VITALS — BP 144/66 | Ht 71.5 in | Wt 299.0 lb

## 2014-03-23 DIAGNOSIS — M76829 Posterior tibial tendinitis, unspecified leg: Secondary | ICD-10-CM

## 2014-03-23 DIAGNOSIS — M7022 Olecranon bursitis, left elbow: Secondary | ICD-10-CM

## 2014-03-23 DIAGNOSIS — M6789 Other specified disorders of synovium and tendon, multiple sites: Secondary | ICD-10-CM

## 2014-03-23 DIAGNOSIS — F411 Generalized anxiety disorder: Secondary | ICD-10-CM

## 2014-03-23 DIAGNOSIS — F322 Major depressive disorder, single episode, severe without psychotic features: Secondary | ICD-10-CM

## 2014-03-23 NOTE — Progress Notes (Addendum)
      THERAPIST PROGRESS NOTE  Session Time: Tuesday 03/23/2014 2:05 PM - 3:00 PM  Participation Level: Active  Behavioral Response: CasualAlertAnxious and Depressed  Type of Therapy: Individual Therapy  Treatment Goals addressed:Alleviate symptoms of depression (depressed mood, feelings of hopelessness/helplessness, loss of interest in activities, isolative behaviors, irritability, crying spells) and return to previous level of functioning Develop healthy thinking patterns about self, others, and the world. Implement coping skills reducing anxiety, excessive worry, and improving daily functioning Process grief and loss issues related to changed functioning and loss of job  Interventions: CBT and Supportive  Summary: LETTIE CZARNECKI is a 53 y.o. female who presents with symptoms of depression and anxiety. She states she initially became depressed in May of 2014 when she and her sisters began having issues regarding expenses for the family home. 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 Patient reports lifelong perfectionistic tendencies and says anxiety has worsened stating that she tends to think too much. Patient's symptoms included depressed mood, anxiety, racing thoughts, ruminating thoughts, sleep difficulty (2-3 hours of sleep per night) excessive worrying, crying spells, loss of interest in activities, and loss of appetite.   Patient reports continued depressed mood and anxiety. She expresses worry about an uncle who is in ICU. She also reports grief and loss issues related to her hairdresser recently dying. This along with the holidays have triggered grief and loss regarding her deceased parents. She also reports continued  stress related to her siblings who make poor choices regarding finances and behaviors per patient's report. Patient reports some involvement in activity but mainly staying in the bed.    Suicidal/Homicidal: no  Therapist Response: Therapist works with patient to process feelings, identify ways to cope with grief and loss during the holidays, identify ways to improve assertiveness skills and to set and maintain boundaries in the relationship with her siblings, identify ways to increase involvement in activity and improve daily routine/structure  Plan: Return again in 2 weeks. Patient agrees to use plan your day handouts and bring to next session.  Diagnosis:Axis I: MDD, GAD  Axis II: No diagnosis    Kambrey Hagger, LCSW 03/23/2014

## 2014-03-23 NOTE — Progress Notes (Signed)
Patient ID: April Ward, female   DOB: 1960/08/22, 53 y.o.   MRN: [id number] Chief Complaint  Patient presents with  . Follow-up    6 week follow up right foot    Second complaint pain swelling over the left olecranon bursa  Addressing first the right foot the patient has been treated with a Cam Walker foot orthotics and anti-inflammatories and pain medication and has not improved in terms of her medial foot pain she has severe pes planus and valgus alignment to the foot with posterior medial pain and would benefit from MRI at this point to address tearing and staging of the posterior tibial tendon for further treatment  Left elbow is swollen tender over the olecranon with normal range of motion no instability normal motor exam skin is not red or erythematous pulses are good sensation is normal there is no evidence of lymph node involvement  Vital signs are stable. BP 144/66 mmHg  Ht 5' 11.5" (1.816 m)  Wt 299 lb (135.626 kg)  BMI 41.13 kg/m2  Past Medical History  Diagnosis Date  . Hypertension   . Diabetes mellitus without complication   . Heart murmur   . Depression   . Anxiety   . GERD (gastroesophageal reflux disease)   . Arthritis   . Gout   . Renal disease 10/2012  . Renal insufficiency   . Renal cancer    She denies any fever chills numbness or tingling related to the left olecranon bursa  Encounter Diagnoses  Name Primary?  Marland Kitchen PTTD (posterior tibial tendon dysfunction)   . Olecranon bursitis of left elbow Yes    Procedure note  Injection  Verbal consent was obtained to aspirate the left olecranon bursa Timeout procedure was completed to confirm injection site  Diagnosis left olecranon bursitis   Anesthesia was provided by ethyl chloride spray  Prep was performed with alcohol  Technique :18-gauge needle was used to aspirate bloody fluid tender 15 mL  No complications were noted

## 2014-03-23 NOTE — Patient Instructions (Signed)
We will call you with your MRI appointment 

## 2014-03-23 NOTE — Patient Instructions (Signed)
Discussed orally 

## 2014-03-29 ENCOUNTER — Telehealth: Payer: Self-pay | Admitting: Orthopedic Surgery

## 2014-03-29 NOTE — Telephone Encounter (Signed)
Patient is asking if her MRI has been approved please advsie?

## 2014-03-31 NOTE — Telephone Encounter (Signed)
MRI is scheduled on 04/10/13 @ [facility] an patient is aware

## 2014-04-01 ENCOUNTER — Telehealth: Payer: Self-pay | Admitting: Gastroenterology

## 2014-04-01 NOTE — Telephone Encounter (Signed)
PLEASE CALL PT. HER PREOP LABS SHOWS A DROP IN HER BLOOD COUNT AND DECREASE IN HER KIDNEY FUNCTION. SHE NEEDS TO SEE HER KIDNEY SPECIALIST. THEY ARE LIKELY RELATED.    SHE NEEDS A FERRITIN/BMP/CBC DRAWN NEXT WEEK. IF HER BLOOD COUNT IS STILL LOW SHE WILL NEED AN UPPER ENDOSCOPY TO CONTINUE THE EVALUATION OF HER LOW BLOOD COUNT.  OPV E30 IN 3 MOS FOR ANEMIA.

## 2014-04-02 ENCOUNTER — Emergency Department (HOSPITAL_COMMUNITY)
Admission: EM | Admit: 2014-04-02 | Discharge: 2014-04-02 | Disposition: A | Discharge status: Home / Self Care | Payer: BLUE CROSS/BLUE SHIELD | Attending: Emergency Medicine | Admitting: Emergency Medicine

## 2014-04-02 ENCOUNTER — Encounter (HOSPITAL_COMMUNITY): Payer: Self-pay | Admitting: *Deleted

## 2014-04-02 ENCOUNTER — Emergency Department (HOSPITAL_COMMUNITY): Payer: BLUE CROSS/BLUE SHIELD

## 2014-04-02 DIAGNOSIS — I10 Essential (primary) hypertension: Secondary | ICD-10-CM | POA: Insufficient documentation

## 2014-04-02 DIAGNOSIS — Z87448 Personal history of other diseases of urinary system: Secondary | ICD-10-CM | POA: Diagnosis not present

## 2014-04-02 DIAGNOSIS — R011 Cardiac murmur, unspecified: Secondary | ICD-10-CM | POA: Insufficient documentation

## 2014-04-02 DIAGNOSIS — M549 Dorsalgia, unspecified: Secondary | ICD-10-CM | POA: Diagnosis present

## 2014-04-02 DIAGNOSIS — Z85528 Personal history of other malignant neoplasm of kidney: Secondary | ICD-10-CM | POA: Diagnosis not present

## 2014-04-02 DIAGNOSIS — E119 Type 2 diabetes mellitus without complications: Secondary | ICD-10-CM | POA: Diagnosis not present

## 2014-04-02 DIAGNOSIS — R109 Unspecified abdominal pain: Secondary | ICD-10-CM | POA: Diagnosis not present

## 2014-04-02 DIAGNOSIS — M545 Low back pain: Secondary | ICD-10-CM | POA: Diagnosis not present

## 2014-04-02 DIAGNOSIS — R52 Pain, unspecified: Secondary | ICD-10-CM

## 2014-04-02 DIAGNOSIS — Z7952 Long term (current) use of systemic steroids: Secondary | ICD-10-CM | POA: Insufficient documentation

## 2014-04-02 DIAGNOSIS — Z7982 Long term (current) use of aspirin: Secondary | ICD-10-CM | POA: Insufficient documentation

## 2014-04-02 DIAGNOSIS — F419 Anxiety disorder, unspecified: Secondary | ICD-10-CM | POA: Insufficient documentation

## 2014-04-02 DIAGNOSIS — Z79899 Other long term (current) drug therapy: Secondary | ICD-10-CM | POA: Insufficient documentation

## 2014-04-02 DIAGNOSIS — F329 Major depressive disorder, single episode, unspecified: Secondary | ICD-10-CM | POA: Diagnosis not present

## 2014-04-02 DIAGNOSIS — M546 Pain in thoracic spine: Secondary | ICD-10-CM

## 2014-04-02 DIAGNOSIS — K219 Gastro-esophageal reflux disease without esophagitis: Secondary | ICD-10-CM | POA: Insufficient documentation

## 2014-04-02 LAB — URINALYSIS, ROUTINE W REFLEX MICROSCOPIC
Bilirubin Urine: NEGATIVE
Glucose, UA: NEGATIVE mg/dL
Hgb urine dipstick: NEGATIVE
Ketones, ur: NEGATIVE mg/dL
Leukocytes, UA: NEGATIVE
Nitrite: NEGATIVE
Protein, ur: 100 mg/dL — AB
Specific Gravity, Urine: 1.02 (ref 1.005–1.030)
Urobilinogen, UA: 0.2 mg/dL (ref 0.0–1.0)
pH: 6 (ref 5.0–8.0)

## 2014-04-02 LAB — URINE MICROSCOPIC-ADD ON

## 2014-04-02 MED ORDER — OXYCODONE-ACETAMINOPHEN 7.5-325 MG PO TABS: 1.0000 | ORAL_TABLET | ORAL | Status: DC | PRN

## 2014-04-02 MED ORDER — METHOCARBAMOL 500 MG PO TABS
1000.0000 mg | ORAL_TABLET | Freq: Four times a day (QID) | ORAL | Status: AC | PRN
Start: 2014-04-02 — End: 2014-04-12

## 2014-04-02 MED ORDER — DIPHENHYDRAMINE HCL 50 MG/ML IJ SOLN
25.0000 mg | Freq: Once | INTRAMUSCULAR | Status: DC
  Filled 2014-04-02: qty 1

## 2014-04-02 MED ORDER — HYDROMORPHONE HCL 1 MG/ML IJ SOLN
2.0000 mg | Freq: Once | INTRAMUSCULAR | Status: AC
Start: 2014-04-02 — End: 2014-04-02
  Administered 2014-04-02: 2 mg via INTRAMUSCULAR
  Filled 2014-04-02: qty 2

## 2014-04-02 MED ORDER — ONDANSETRON 8 MG PO TBDP
8.0000 mg | ORAL_TABLET | Freq: Once | ORAL | Status: AC
Start: 2014-04-02 — End: 2014-04-02
  Administered 2014-04-02: 8 mg via ORAL
  Filled 2014-04-02: qty 1

## 2014-04-02 NOTE — Discharge Instructions (Signed)
Thoracic Strain You have injured the muscles or tendons that attach to the upper part of your back behind your chest. This injury is called a thoracic strain, thoracic sprain, or mid-back strain.  CAUSES  The cause of thoracic strain varies. A less severe injury involves pulling a muscle or tendon without tearing it. A more severe injury involves tearing (rupturing) a muscle or tendon. With less severe injuries, there may be little loss of strength. Sometimes, there are breaks (fractures) in the bones to which the muscles are attached. These fractures are rare, unless there was a direct hit (trauma) or you have weak bones due to osteoporosis or age. Longstanding strains may be caused by overuse or improper form during certain movements. Obesity can also increase your risk for back injuries. Sudden strains may occur due to injury or not warming up properly before exercise. Often, there is no obvious cause for a thoracic strain. SYMPTOMS  The main symptom is pain, especially with movement, such as during exercise. DIAGNOSIS  Your caregiver can usually tell what is wrong by taking an X-ray and doing a physical exam. TREATMENT   Physical therapy may be helpful for recovery. Your caregiver can give you exercises to do or refer you to a physical therapist after your pain improves.  After your pain improves, strengthening and conditioning programs appropriate for your sport or occupation may be helpful.  Always warm up before physical activities or athletics. Stretching after physical activity may also help.  Certain over-the-counter medicines may also help. Ask your caregiver if there are medicines that would help you. If this is your first thoracic strain injury, proper care and proper healing time before starting activities should prevent long-term problems. Torn ligaments and tendons require as long to heal as broken bones. Average healing times may be only 1 week for a mild strain. For torn muscles  and tendons, healing time may be up to 6 weeks to 2 months. HOME CARE INSTRUCTIONS   Apply ice to the injured area. Ice massages may also be used as directed.  Put ice in a plastic bag.  Place a towel between your skin and the bag.  Leave the ice on for 15-20 minutes, 03-04 times a day, for the first 2 days.  Only take over-the-counter or prescription medicines for pain, discomfort, or fever as directed by your caregiver.  Keep your appointments for physical therapy if this was prescribed.  Use wraps and back braces as instructed. SEEK IMMEDIATE MEDICAL CARE IF:   You have an increase in bruising, swelling, or pain.  Your pain has not improved with medicines.  You develop new shortness of breath, chest pain, or fever.  Problems seem to be getting worse rather than better. MAKE SURE YOU:   Understand these instructions.  Will watch your condition.  Will get help right away if you are not doing well or get worse. Document Released: 06/09/2003 Document Revised: 06/11/2011 Document Reviewed: 05/05/2010 [facility] Patient Information 2015 [place], [place]. This information is not intended to replace advice given to you by your health care provider. Make sure you discuss any questions you have with your health care provider.   Call Dr Berdine Addison this week for further evaluation if your symptoms persist.  Your labs and xrays today do not show [place] a source of your pain which I suspect is of a musculoskeletal source (thoracic and/or rib cage strain).  Try the stronger medications prescribed.  See your primary doctor this week for further evaluation of this  pain if it persists.

## 2014-04-02 NOTE — ED Notes (Signed)
Pain rt lower back, since 12/4  Seen here 12/17 for same.  Had U/S of rt kidney at [facility] that was normal.  No NVD,    Increased pain with movement.   No UTI  sx

## 2014-04-02 NOTE — ED Provider Notes (Signed)
CSN: [id number]     Arrival date & time 04/02/14  1515 History   First MD Initiated Contact with Patient 04/02/14 1540     Chief Complaint  Patient presents with  . Back Pain     (Consider location/radiation/quality/duration/timing/severity/associated sxs/prior Treatment) The history is provided by the patient.   April Ward is a 54 y.o. female with a past medical history of DM, HTN, GERD, chronic low back pain and renal insufficiency with a history of renal cell cancer with left partial nephrectomy presenting with right flank through lower back pain which has not improved since her last visit here for this on 12/17.  She was seen by her nephrologist at [facility] on 12/16 at which time she had stable urinalysis and labwork and underwent an [place] of her kidneys, with her back pain starting shortly after this procedure.  She recall the [place] tech was very aggressive with this study trying to get adequate images and wonders if this procedure caused her pain.  Her pain is constant, worse with palpation and movement and was not improved with the oxycodone and flexeril she was prescribed at her last visit.  Additionally she used a heating pad with no relief.  She denies hematuria, dysuria, denies fevers, chills, rash, shortness of breath.  She is currently being treated also for a left elbow bursitis and right foot tibial tendonitis, followed by Dr. Aline Brochure.  She saw him this week but she did not discuss her back pain with him.      Past Medical History  Diagnosis Date  . Hypertension   . Diabetes mellitus without complication   . Heart murmur   . Depression   . Anxiety   . GERD (gastroesophageal reflux disease)   . Arthritis   . Gout   . Renal disease 10/2012  . Renal insufficiency   . Renal cancer    Past Surgical History  Procedure Laterality Date  . Cesarean section  [id number]  . Knee arthroscopy with medial menisectomy Right 08/22/2012    Procedure: KNEE ARTHROSCOPY WITH PARTIAL MEDIAL  MENISECTOMY;  Surgeon: Carole Civil, MD;  Location: [facility];  Service: Orthopedics;  Laterality: Right;  . Chondroplasty Right 08/22/2012    Procedure: CHONDROPLASTY;  Surgeon: Carole Civil, MD;  Location: [facility];  Service: Orthopedics;  Laterality: Right;  . Partial nephrectomy  Dec 2014    left  . Colonoscopy with propofol N/A 03/16/2014    Procedure: COLONOSCOPY WITH PROPOFOL (at cecum 1023, total withdrawal time=9 minutes);  Surgeon: Danie Binder, MD;  Location: [facility];  Service: Endoscopy;  Laterality: N/A;   Family History  Problem Relation Age of Onset  . Heart attack Mother   . Heart attack Father   . Depression Paternal Aunt   . Alcohol abuse Maternal Uncle   . Colon cancer Maternal Aunt   . Colon cancer Maternal Uncle    History  Substance Use Topics  . Smoking status: Never Smoker   . Smokeless tobacco: Never Used  . Alcohol Use: No   OB History    Gravida Para Term Preterm AB TAB SAB Ectopic Multiple Living   2 2 2       2      Review of Systems  Constitutional: Negative for fever.  Respiratory: Negative for shortness of breath.   Cardiovascular: Negative for chest pain and leg swelling.  Gastrointestinal: Negative.   Genitourinary: Positive for flank pain. Negative for dysuria, urgency, frequency and difficulty urinating.  Musculoskeletal: Positive  for back pain. Negative for joint swelling and gait problem.  Skin: Negative for rash.  Neurological: Negative for weakness and numbness.      Allergies  Skelaxin  Home Medications   Prior to Admission medications   Medication Sig Start Date End Date Taking? Authorizing Provider  amLODipine (NORVASC) 5 MG tablet Take 5 mg by mouth daily. 02/23/14  Yes Historical Provider, MD  aspirin EC 81 MG tablet Take 1 tablet (81 mg total) by mouth daily. 02/18/14  Yes Arnoldo Lenis, MD  atorvastatin (LIPITOR) 40 MG tablet Take 1 tablet (40 mg total) by mouth daily. 02/18/14  Yes Arnoldo Lenis, MD    buPROPion (WELLBUTRIN XL) 150 MG 24 hr tablet Take 1 tablet (150 mg total) by mouth every morning. 03/15/14 03/15/15 Yes [name], MD  carbamazepine (TEGRETOL) 200 MG tablet Take 2 tablets (400 mg total) by mouth daily. 03/15/14  Yes [name], MD  cloNIDine (CATAPRES - DOSED IN MG/24 HR) 0.1 mg/24hr patch Place 1 patch (0.1 mg total) onto the skin once a week. 02/18/14  Yes Arnoldo Lenis, MD  colchicine 0.6 MG tablet Take 0.6 mg by mouth as needed (gout).  10/20/13  Yes Historical Provider, MD  cyclobenzaprine (FLEXERIL) 5 MG tablet Take 1 tablet (5 mg total) by mouth 3 (three) times daily as needed for muscle spasms. 03/18/14  Yes Almyra Free Braysen Cloward, PA-C  diazepam (VALIUM) 10 MG tablet Take 1 tablet (10 mg total) by mouth 4 (four) times daily. 03/15/14  Yes [name], MD  fluticasone [facility]) 50 MCG/ACT nasal spray Place 2 sprays into both nostrils at bedtime. 01/12/14  Yes Waldemar Dickens, MD  furosemide (LASIX) 40 MG tablet Take 1 tablet (40 mg total) by mouth 2 (two) times daily. 09/23/13  Yes Arnoldo Lenis, MD  hydrALAZINE (APRESOLINE) 100 MG tablet Take 1 tablet (100 mg total) by mouth 3 (three) times daily. 09/23/13  Yes Arnoldo Lenis, MD  ipratropium (ATROVENT) 0.06 % nasal spray Place 2 sprays into both nostrils 4 (four) times daily. 01/12/14  Yes Waldemar Dickens, MD  labetalol (NORMODYNE) 200 MG tablet Take 2 tablets (400 mg total) by mouth 2 (two) times daily. 11/18/13  Yes Arnoldo Lenis, MD  linagliptin (TRADJENTA) 5 MG TABS tablet Take 5 mg by mouth daily.   Yes Historical Provider, MD  losartan (COZAAR) 50 MG tablet Take 75 mg by mouth daily.    Yes Historical Provider, MD  omeprazole (PRILOSEC) 20 MG capsule Take 20 mg by mouth daily.   Yes Historical Provider, MD  prazosin (MINIPRESS) 5 MG capsule Take 1 capsule (5 mg total) by mouth at bedtime. 03/15/14  Yes [name], MD  QUEtiapine (SEROQUEL) 50 MG tablet Take 1 tablet (50 mg total) by mouth at bedtime.  03/15/14 03/15/15 Yes [name], MD  ULORIC 40 MG tablet Take 40 mg by mouth daily.  10/16/13  Yes Historical Provider, MD  methocarbamol (ROBAXIN) 500 MG tablet Take 2 tablets (1,000 mg total) by mouth every 6 (six) hours as needed for muscle spasms. 04/02/14 04/12/14  Evalee Jefferson, PA-C  oxyCODONE-acetaminophen (PERCOCET) 7.5-325 MG per tablet Take 1 tablet by mouth every 4 (four) hours as needed for pain. 04/02/14   Evalee Jefferson, PA-C   BP 156/77 mmHg  Pulse 62  Temp(Src) 97.9 F (36.6 C) (Oral)  Resp 20  Ht 5' 11.5" (1.816 m)  Wt 299 lb (135.626 kg)  BMI 41.13 kg/m2  SpO2 100% Physical Exam  Constitutional: She  is oriented to person, place, and time. She appears well-developed and well-nourished.  HENT:  Head: Normocephalic.  Eyes: Conjunctivae are normal.  Neck: Normal range of motion.  Cardiovascular: Normal rate and intact distal pulses.   Pedal pulses normal.  Pulmonary/Chest: Effort normal and breath sounds normal. No respiratory distress.   She exhibits no tenderness.  ttp right lateral posterior ribcage. No palpable deformity, no edema, erythema, no rash. No midline thoracic or lumbar pain.  Abdominal: Soft. Bowel sounds are normal. She exhibits no distension and no mass. There is no tenderness. There is no rigidity, no rebound and no guarding.  Musculoskeletal: Normal range of motion. She exhibits no edema.       Lumbar back: She exhibits tenderness. She exhibits no bony tenderness, no swelling, no edema and no spasm.  Right parlumbar ttp.  Neurological: She is alert and oriented to person, place, and time. She has normal strength. She displays no atrophy and no tremor. No sensory deficit. Gait normal.  Reflex Scores:      Patellar reflexes are 2+ on the right side and 2+ on the left side. No strength deficit noted in hip and knee flexor and extensor muscle groups.  Ankle flexion and extension intact.  Skin: Skin is warm and dry.  Psychiatric: She has a normal mood and affect.   Nursing note and vitals reviewed.   ED Course  Procedures (including critical care time) Labs Review Labs Reviewed  URINALYSIS, ROUTINE W REFLEX MICROSCOPIC - Abnormal; Notable for the following:    APPearance CLOUDY (*)    Protein, ur 100 (*)    All other components within normal limits  URINE MICROSCOPIC-ADD ON - Abnormal; Notable for the following:    Squamous Epithelial / LPF MANY (*)    Bacteria, UA FEW (*)    All other components within normal limits    Imaging Review Dg Ribs Unilateral W/chest Right  04/02/2014   CLINICAL DATA:  Right axillary pain.  EXAM: RIGHT RIBS AND CHEST - 3+ VIEW  COMPARISON:  None.  FINDINGS: No fracture or other bone lesions are seen involving the ribs. There is no evidence of pneumothorax or pleural effusion. Both lungs are clear. Heart size and mediastinal contours are within normal limits.  IMPRESSION: Normal right ribs.  No acute cardiopulmonary abnormality seen.   Electronically Signed   By: Sabino Dick M.D.   On: 04/02/2014 16:50     EKG Interpretation None      MDM   Final diagnoses:  Pain  Right-sided thoracic back pain     Renal US study performed at [facility] on 03/17/14  reviewed.  No hydronephrosis, simple cyst, no renal stones. Perfusion normal.  UA without infection, prenephrotic with proteinuria.  Pt with reproducible right lower thoracic back/ flank pain suggestive of musculoskeletal source.  Rib detail xrays negative.  UA negative for infection. Proteinuria c/w UA results from [facility].  She was prescribed oxycodone, robaxin. Plan f/u with her pcp this week.  The patient appears reasonably screened and/or stabilized for discharge and I doubt any other medical condition or other [facility] requiring further screening, evaluation, or treatment in the ED at this time prior to discharge.  Evalee Jefferson, PA-C 04/02/14 [place], MD 04/02/14 2255

## 2014-04-03 ENCOUNTER — Other Ambulatory Visit: Payer: Self-pay | Admitting: Cardiology

## 2014-04-05 ENCOUNTER — Telehealth: Payer: Self-pay | Admitting: *Deleted

## 2014-04-05 ENCOUNTER — Other Ambulatory Visit: Payer: Self-pay

## 2014-04-05 DIAGNOSIS — D649 Anemia, unspecified: Secondary | ICD-10-CM

## 2014-04-05 MED ORDER — FUROSEMIDE 40 MG PO TABS: 40.0000 mg | ORAL_TABLET | Freq: Two times a day (BID) | ORAL | Status: DC

## 2014-04-05 MED ORDER — HYDRALAZINE HCL 100 MG PO TABS: 100.0000 mg | ORAL_TABLET | Freq: Three times a day (TID) | ORAL | Status: DC

## 2014-04-05 NOTE — Telephone Encounter (Signed)
ON RECALL LIST  °

## 2014-04-05 NOTE — Telephone Encounter (Signed)
Medications sent to pharmacy

## 2014-04-05 NOTE — Telephone Encounter (Signed)
Pt is aware and orders have been faxed to [facility].

## 2014-04-08 ENCOUNTER — Ambulatory Visit (HOSPITAL_COMMUNITY)
Admission: RE | Admit: 2014-04-08 | Discharge: 2014-04-08 | Disposition: A | Discharge status: Home / Self Care | Payer: BLUE CROSS/BLUE SHIELD | Source: Ambulatory Visit | Attending: Orthopedic Surgery | Admitting: Orthopedic Surgery

## 2014-04-08 DIAGNOSIS — R262 Difficulty in walking, not elsewhere classified: Secondary | ICD-10-CM | POA: Diagnosis not present

## 2014-04-08 DIAGNOSIS — M25571 Pain in right ankle and joints of right foot: Secondary | ICD-10-CM | POA: Diagnosis not present

## 2014-04-08 DIAGNOSIS — M2141 Flat foot [pes planus] (acquired), right foot: Secondary | ICD-10-CM | POA: Diagnosis not present

## 2014-04-08 DIAGNOSIS — M25651 Stiffness of right hip, not elsewhere classified: Secondary | ICD-10-CM | POA: Diagnosis not present

## 2014-04-08 DIAGNOSIS — R29898 Other symptoms and signs involving the musculoskeletal system: Secondary | ICD-10-CM

## 2014-04-08 NOTE — Therapy (Signed)
[place], [place], [address] Phone: [phone]   Fax:  [phone]  Physical Therapy Evaluation  Patient Details  Name: April Ward MRN: [id number] Date of Birth: 1960-09-01 Referring Provider:  Carole Civil, MD  Encounter Date: 04/08/2014      PT End of Session - 04/08/14 1602    Visit Number 1   Number of Visits 20   Date for PT Re-Evaluation 05/08/14   Authorization Type [employer]   Authorization - Visit Number 1   Authorization - Number of Visits 20   PT Start Time 1751   PT Stop Time 1600   PT Time Calculation (min) 45 min      Past Medical History  Diagnosis Date  . Hypertension   . Diabetes mellitus without complication   . Heart murmur   . Depression   . Anxiety   . GERD (gastroesophageal reflux disease)   . Arthritis   . Gout   . Renal disease 10/2012  . Renal insufficiency   . Renal cancer     Past Surgical History  Procedure Laterality Date  . Cesarean section  [id number]  . Knee arthroscopy with medial menisectomy Right 08/22/2012    Procedure: KNEE ARTHROSCOPY WITH PARTIAL MEDIAL MENISECTOMY;  Surgeon: Carole Civil, MD;  Location: [facility];  Service: Orthopedics;  Laterality: Right;  . Chondroplasty Right 08/22/2012    Procedure: CHONDROPLASTY;  Surgeon: Carole Civil, MD;  Location: [facility];  Service: Orthopedics;  Laterality: Right;  . Partial nephrectomy  Dec 2014    left  . Colonoscopy with propofol N/A 03/16/2014    Procedure: COLONOSCOPY WITH PROPOFOL (at cecum 1023, total withdrawal time=9 minutes);  Surgeon: Danie Binder, MD;  Location: [facility];  Service: Endoscopy;  Laterality: N/A;    There were no vitals taken for this visit.  Visit Diagnosis:  Right ankle pain  Weakness of right foot  Hip stiffness, right  Difficulty walking      Subjective Assessment - 04/08/14 1527    Pertinent History Rt foot/ankle pain with walking, weight bearing. Pain since  august. Patient has previously been placed in a boot for 4 weeks in september to decrease pain. Patient has an MRI scedules on Saturday. Patient has previsously had cortizone injects that provided only temporary relief history of Rt torn meniscus. patient was given orthotics from [employer] with over the counter which patient no longer uses due to lask of relief. .    Limitations Walking   How long can you walk comfortably? unable   Patient Stated Goals to be able to walk wihtou pain.    Pain Onset More than a month ago          [facility] PT Assessment - 04/08/14 0001    Assessment   Medical Diagnosis Posterior tibial tendon dysfunction.    Onset Date 11/06/13   Next MD Visit [facility]. 04/13/14   Prior Therapy no   Balance Screen   Has the patient fallen in the past 6 months No   Has the patient had a decrease in activity level because of a fear of falling?  No   Is the patient reluctant to leave their home because of a fear of falling?  No   Prior Function   Level of Independence Independent with basic ADLs   Cognition   Overall Cognitive Status Within Functional Limits for tasks assessed   Observation/Other Assessments   Focus on Therapeutic Outcomes (FOTO)  51% limited   Sit to Stand   Comments independent with no hands   Other:   Other/ Comments Gait: inability to toe n on Rt excessive toe out bilaterally, greater on Rt. excessive arch collapse, excessive knee valgus, limited hip sway. defficutly with crossover gait secondary to weakness.    AROM   Right Hip External Rotation  65   Right Hip Internal Rotation  7   Left Hip External Rotation  43   Left Hip Internal Rotation  16   Right Ankle Dorsiflexion 14   Right Ankle Plantar Flexion 40   Right Ankle Inversion 40   Right Ankle Eversion 22   Left Ankle Dorsiflexion 20   Left Ankle Plantar Flexion 50   Left Ankle Inversion 60   Left Ankle Eversion 30   Strength   Right Hip Extension 2+/5   Right Hip ABduction 2+/5    Left Hip Extension 5/5   Left Hip ABduction 4/5   Right Ankle Dorsiflexion 4/5   Right Ankle Plantar Flexion 2-/5   Right Ankle Inversion --  4-   Right Ankle Eversion --  4-   Left Ankle Dorsiflexion 5/5   Left Ankle Plantar Flexion 2-/5   Left Ankle Inversion 5/5   Left Ankle Eversion 5/5                          PT Education - 04/08/14 1552    Education provided Yes   Education Details Diagnosis/prognosis and piriformis stretch, calf raises with toes in, and sum walk   Person(s) Educated Patient   Methods Explanation;Demonstration;Handout   Comprehension Verbalized understanding;Returned demonstration          PT Short Term Goals - 04/08/14 1914    PT SHORT TERM GOAL #1   Title Patient will dmeosntrate increased ankle dorsiflexion to 18 degrees   Time 4   Period Weeks   Status New   PT SHORT TERM GOAL #2   Title Patient will demonstrate increased hip internal rotation to 15 degrees on Rt to be able to improve deceleration mechanics.   Time 4   Period Weeks   Status New   PT SHORT TERM GOAL #3   Title Patient will demonstrate increased ankle plantar flexion of 3/5 to be bale to come up on toes withotu UE support   Time 4   Period Weeks   Status New   PT SHORT TERM GOAL #4   Title Patient will dmeonstrate decreased pain to <3/10 while standing <1 hour.    Time 4   Period Weeks   Status New           PT Long Term Goals - 04/08/14 1917    PT LONG TERM GOAL #1   Title Patient will demonstrate increased hip internal rotation to >25 degrees on Rt to be able to improve deceleration mechanics   Time 8   Period Weeks   Status New   PT LONG TERM GOAL #2   Title Patient will demonstrate increased ankle plantar flexion of 4/5 to be bale to come up on toes without UE support   Time 8   Period Weeks   Status New   PT LONG TERM GOAL #3   Title Patient will dmeonstrate decreased pain to <2/10 while standing <2 hour.    Time 8   Period Weeks    Status New   PT LONG TERM GOAL #4   Title Patient will be  independent with HEP.    Time 8   Period Weeks   Status New               Plan - 04/08/14 1911    Clinical Impression Statement Patient dispalsy posterior tibial tendon pain and Rt lateral foot pain secondary to compromased foot, ankle, knee and hip mechanics resultign in difficutly walking. Speciifcially patient display Rt ankle weakness of excessive foot pronation, linmited calcaneal inversion during weight bearing, excessive toe out, llimited Rt hip internal rotation, and excessicve Rt knee valgus moment secondary to Rt lglut weakness reultting in excessive strain on Rt posterior tibial tendon. Patient will benefit form skilled phsyical therapy to address the above listed limitations and return to regular walking withtou pain. Expect patient to require physical therapy for 12 weeks to attain long term goal of walking without pain. also suggest custom orthotics to improve foot mechanics.   Pt will benefit from skilled therapeutic intervention in order to improve on the following deficits Abnormal gait;Decreased endurance;Improper body mechanics;Impaired flexibility;Decreased strength;Decreased activity tolerance;Difficulty walking;Pain;Decreased range of motion   Rehab Potential Good   PT Frequency 3x / week   PT Duration 4 weeks  progress to 2x a week for 4 more weeks for second 4 weeks   PT Treatment/Interventions Gait training;Passive range of motion;Patient/family education;Therapeutic activities;Therapeutic exercise;Manual techniques;Balance training   PT Next Visit Plan Focus opf therapy to be on increasing hip internal rotation, ankle plantar flexion and inversion strengthening, and glut med/max strengtheing.    PT Home Exercise Plan sumo walk, toe in calf raise, and piriformis stretch.    Consulted and Agree with Plan of Care Patient         Problem List Patient Active Problem List   Diagnosis Date Noted  .  Encounter for screening colonoscopy 12/17/2013  . Pain in joint, ankle and foot 12/14/2013  . Anxiety 09/23/2013  . Diabetes mellitus 09/23/2013  . Unspecified essential hypertension 09/23/2013  . Chronic renal disease, stage 3, moderately decreased glomerular filtration rate between 30-59 mL/min/1.73 square meter 09/23/2013  . Gout 09/23/2013  . Major depression 04/17/2013  . Stiffness of right knee 09/03/2012  . Difficulty in walking(719.7) 09/03/2012  . OA (osteoarthritis) of knee 07/23/2012  . Medial meniscus, posterior horn derangement 07/23/2012   Devona Konig PT DPT [place], [place], [address] Phone: [phone]   Fax:  [phone]

## 2014-04-08 NOTE — Patient Instructions (Signed)
Heel Raise (Calf Strength / Balance)   Rise up on tiptoes, breathing out through pursed lips. Hold position to count of 1. Return slowly, breathing in. Repeat 10x times per session. Do 3 sets every other day. When this becomes easy add 3 sets with toes pointed straight ahead. When that becomes easy begin performing heel raises going up on two and down on 1, and when that becomes easy perform single leg calf raises. .  Band Walk: Side Stepping   Tie band around legs, just above knees. Step ___ feet to one side, then step back to start. Repeat ___ feet per session. Note: Small towel between band and skin eases rubbing.  http://plyo.exer.us/76   Copyright  VHI. All rights reserved.    Copyright  VHI. All rights reserved.

## 2014-04-10 ENCOUNTER — Ambulatory Visit
Admission: RE | Admit: 2014-04-10 | Discharge: 2014-04-10 | Disposition: A | Discharge status: Home / Self Care | Payer: BLUE CROSS/BLUE SHIELD | Source: Ambulatory Visit | Attending: Orthopedic Surgery | Admitting: Orthopedic Surgery

## 2014-04-10 DIAGNOSIS — M76829 Posterior tibial tendinitis, unspecified leg: Secondary | ICD-10-CM

## 2014-04-12 ENCOUNTER — Telehealth: Payer: Self-pay | Admitting: Orthopedic Surgery

## 2014-04-12 NOTE — Telephone Encounter (Signed)
Opened in Error.

## 2014-04-13 ENCOUNTER — Ambulatory Visit (INDEPENDENT_AMBULATORY_CARE_PROVIDER_SITE_OTHER): Payer: BLUE CROSS/BLUE SHIELD | Admitting: Orthopedic Surgery

## 2014-04-13 ENCOUNTER — Encounter (HOSPITAL_COMMUNITY): Payer: Self-pay

## 2014-04-13 ENCOUNTER — Ambulatory Visit (HOSPITAL_COMMUNITY)
Admission: RE | Admit: 2014-04-13 | Discharge: 2014-04-13 | Disposition: A | Discharge status: Home / Self Care | Payer: BLUE CROSS/BLUE SHIELD | Source: Ambulatory Visit | Attending: Orthopedic Surgery | Admitting: Orthopedic Surgery

## 2014-04-13 ENCOUNTER — Encounter: Payer: Self-pay | Admitting: Orthopedic Surgery

## 2014-04-13 VITALS — BP 152/75 | Ht 71.5 in | Wt 299.0 lb

## 2014-04-13 DIAGNOSIS — M25571 Pain in right ankle and joints of right foot: Secondary | ICD-10-CM | POA: Diagnosis not present

## 2014-04-13 DIAGNOSIS — M25651 Stiffness of right hip, not elsewhere classified: Secondary | ICD-10-CM

## 2014-04-13 DIAGNOSIS — R262 Difficulty in walking, not elsewhere classified: Secondary | ICD-10-CM

## 2014-04-13 DIAGNOSIS — M25661 Stiffness of right knee, not elsewhere classified: Secondary | ICD-10-CM

## 2014-04-13 DIAGNOSIS — R29898 Other symptoms and signs involving the musculoskeletal system: Secondary | ICD-10-CM

## 2014-04-13 DIAGNOSIS — M6789 Other specified disorders of synovium and tendon, multiple sites: Secondary | ICD-10-CM

## 2014-04-13 DIAGNOSIS — M76829 Posterior tibial tendinitis, unspecified leg: Secondary | ICD-10-CM

## 2014-04-13 NOTE — Therapy (Signed)
[place], [place], [address] Phone: [phone]   Fax:  [phone]  Physical Therapy Treatment  Patient Details  Name: April Ward MRN: [id number] Date of Birth: 10-01-1960 Referring Provider:  Sanjuana Kava, MD  Encounter Date: 04/13/2014      PT End of Session - 04/13/14 1009    Visit Number 2   Number of Visits 20   Date for PT Re-Evaluation 05/08/14   Authorization Type [employer]   Authorization - Visit Number 2   Authorization - Number of Visits 20   PT Start Time [phone]   PT Stop Time 1006   PT Time Calculation (min) 24 min   Activity Tolerance Patient tolerated treatment well   Behavior During Therapy [facility] for tasks assessed/performed      Past Medical History  Diagnosis Date  . Hypertension   . Diabetes mellitus without complication   . Heart murmur   . Depression   . Anxiety   . GERD (gastroesophageal reflux disease)   . Arthritis   . Gout   . Renal disease 10/2012  . Renal insufficiency   . Renal cancer     Past Surgical History  Procedure Laterality Date  . Cesarean section  [id number]  . Knee arthroscopy with medial menisectomy Right 08/22/2012    Procedure: KNEE ARTHROSCOPY WITH PARTIAL MEDIAL MENISECTOMY;  Surgeon: Carole Civil, MD;  Location: [facility];  Service: Orthopedics;  Laterality: Right;  . Chondroplasty Right 08/22/2012    Procedure: CHONDROPLASTY;  Surgeon: Carole Civil, MD;  Location: [facility];  Service: Orthopedics;  Laterality: Right;  . Partial nephrectomy  Dec 2014    left  . Colonoscopy with propofol N/A 03/16/2014    Procedure: COLONOSCOPY WITH PROPOFOL (at cecum 1023, total withdrawal time=9 minutes);  Surgeon: Danie Binder, MD;  Location: [facility];  Service: Endoscopy;  Laterality: N/A;    There were no vitals taken for this visit.  Visit Diagnosis:  Right ankle pain  Weakness of right foot  Hip stiffness, right  Difficulty walking  Stiffness of right  knee      Subjective Assessment - 04/13/14 0949    Symptoms Pt 10 min late for apt today.  Currently pain free, stated pain usually comes following walking for long periods of time.  Compliant with HEP exercises and  attends gym 2-3 times a week.   MD apt today to review MRI results.   Currently in Pain? No/denies                    [facility] Adult PT Treatment/Exercise - 04/13/14 0001    Exercises   Exercises Ankle;Knee/Hip   Knee/Hip Exercises: Stretches   Piriformis Stretch 2 reps;30 seconds   Piriformis Stretch Limitations seated    Knee/Hip Exercises: Standing   Heel Raises 2 sets;10 reps   Heel Raises Limitations Toe raises with therapist facilitation to reduce momentum   Functional Squat 5 reps;Limitations   Functional Squat Limitations squat walk around box 5 reps Bils   Other Standing Knee Exercises Sumo walking 2RT   Other Standing Knee Exercises 3D ankle excursion 10x                  PT Short Term Goals - 04/13/14 1012    PT SHORT TERM GOAL #1   Title Patient will dmeosntrate increased ankle dorsiflexion to 18 degrees   Status On-going   PT SHORT TERM GOAL #2   Title  Patient will demonstrate increased hip internal rotation to 15 degrees on Rt to be able to improve deceleration mechanics.   Status On-going   PT SHORT TERM GOAL #3   Title Patient will demonstrate increased ankle plantar flexion of 3/5 to be bale to come up on toes withotu UE support   Status On-going   PT SHORT TERM GOAL #4   Title Patient will dmeonstrate decreased pain to <3/10 while standing <1 hour.    Status On-going           PT Long Term Goals - 04/13/14 1013    PT LONG TERM GOAL #1   Title Patient will demonstrate increased hip internal rotation to >25 degrees on Rt to be able to improve deceleration mechanics   PT LONG TERM GOAL #2   Title Patient will demonstrate increased ankle plantar flexion of 4/5 to be bale to come up on toes without UE support   PT LONG TERM  GOAL #3   Title Patient will dmeonstrate decreased pain to <2/10 while standing <2 hour.    PT LONG TERM GOAL #4   Title Patient will be independent with HEP.                Plan - 04/13/14 1010    Clinical Impression Statement Added 3D ankle excursion to improve ankle AROM especially plantar flexion and inversion with therapist facilitaiotn to improve form and position to increase AROM. Began squat walk arounds to improve IR, cueing to reduce ER with exercises.  Continued with piriformis stretches to reduce ER with standing and gait.  Pt limited by fatigue, no reports of pain through session.     PT Next Visit Plan Focus opf therapy to be on increasing hip internal rotation, ankle plantar flexion and inversion strengthening, and glut med/max strengtheing.         Problem List Patient Active Problem List   Diagnosis Date Noted  . Encounter for screening colonoscopy 12/17/2013  . Pain in joint, ankle and foot 12/14/2013  . Anxiety 09/23/2013  . Diabetes mellitus 09/23/2013  . Unspecified essential hypertension 09/23/2013  . Chronic renal disease, stage 3, moderately decreased glomerular filtration rate between 30-59 mL/min/1.73 square meter 09/23/2013  . Gout 09/23/2013  . Major depression 04/17/2013  . Stiffness of right knee 09/03/2012  . Difficulty in walking(719.7) 09/03/2012  . OA (osteoarthritis) of knee 07/23/2012  . Medial meniscus, posterior horn derangement 07/23/2012   Ihor Austin, [place]   Aldona Lento 04/13/2014, 10:14 AM  [place], [place], [address] Phone: [phone]   Fax:  [phone]

## 2014-04-13 NOTE — Progress Notes (Signed)
Patient ID: April Ward, female   DOB: 1960-07-24, 54 y.o.   MRN: [id number] Chief Complaint  Patient presents with  . Results    Review of MRI Right ankle    Encounter Diagnosis  Name Primary?  Marland Kitchen PTTD (posterior tibial tendon dysfunction) Yes   C/o pain and activity related pain   After MRI review i agree that she has Partial tear and OA  IMPRESSION: 1. Severe tendinosis and a high-grade partial thickness, near complete, tear of the tibialis posterior at the level of the medial malleolus. 2. 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     Electronically Signed   By: Kathreen Devoid   On: 04/11/2014 12:50  Referral to Dr Caprice Beaver for treatment

## 2014-04-13 NOTE — Patient Instructions (Signed)
REFERRAL TO DR Caprice Beaver FOR PARTIALLY TORN TENDON

## 2014-04-14 ENCOUNTER — Encounter (HOSPITAL_COMMUNITY): Payer: Self-pay

## 2014-04-14 ENCOUNTER — Ambulatory Visit (HOSPITAL_COMMUNITY)
Admission: RE | Admit: 2014-04-14 | Discharge: 2014-04-14 | Disposition: A | Discharge status: Home / Self Care | Payer: BLUE CROSS/BLUE SHIELD | Source: Ambulatory Visit | Attending: Orthopedic Surgery | Admitting: Orthopedic Surgery

## 2014-04-14 ENCOUNTER — Telehealth: Payer: Self-pay | Admitting: Orthopedic Surgery

## 2014-04-14 NOTE — Telephone Encounter (Signed)
April Ward is asking if it is any need for her to continue physical therapy since she will be seeing Dr. Caprice Beaver January 22nd, please advise?

## 2014-04-14 NOTE — Telephone Encounter (Signed)
Routing to Dr Harrison 

## 2014-04-15 ENCOUNTER — Other Ambulatory Visit: Payer: Self-pay | Admitting: *Deleted

## 2014-04-15 ENCOUNTER — Telehealth: Payer: Self-pay | Admitting: *Deleted

## 2014-04-15 DIAGNOSIS — M76829 Posterior tibial tendinitis, unspecified leg: Secondary | ICD-10-CM

## 2014-04-15 NOTE — Telephone Encounter (Signed)
Stop PT 

## 2014-04-15 NOTE — Telephone Encounter (Signed)
Called patient, no answer 

## 2014-04-15 NOTE — Telephone Encounter (Signed)
Referral faxed to Dr Caprice Beaver 04/14/14

## 2014-04-16 ENCOUNTER — Ambulatory Visit (INDEPENDENT_AMBULATORY_CARE_PROVIDER_SITE_OTHER): Payer: BLUE CROSS/BLUE SHIELD | Admitting: Psychiatry

## 2014-04-16 ENCOUNTER — Ambulatory Visit (HOSPITAL_COMMUNITY)
Admission: RE | Admit: 2014-04-16 | Payer: BLUE CROSS/BLUE SHIELD | Source: Ambulatory Visit | Admitting: Physical Therapy

## 2014-04-16 DIAGNOSIS — F411 Generalized anxiety disorder: Secondary | ICD-10-CM

## 2014-04-16 DIAGNOSIS — F322 Major depressive disorder, single episode, severe without psychotic features: Secondary | ICD-10-CM

## 2014-04-16 NOTE — Patient Instructions (Signed)
Discussed orally 

## 2014-04-16 NOTE — Progress Notes (Addendum)
       THERAPIST PROGRESS NOTE  Session Time: Friday 04/16/2014 10:15 AM -11:00 AM  Participation Level: Active  Behavioral Response: CasualAlertAnxious and Depressed/tearful  Type of Therapy: Individual Therapy  Treatment Goals addressed:Alleviate symptoms of depression (depressed mood, feelings of hopelessness/helplessness, loss of interest in activities, isolative behaviors, irritability, crying spells) and return to previous level of functioning Develop healthy thinking patterns about self, others, and the world. Implement coping skills reducing anxiety, excessive worry, and improving daily functioning Process grief and loss issues related to changed functioning and loss of job  Interventions: CBT and Supportive  Summary: April Ward is a 54 y.o. female who presents with symptoms of depression and anxiety. She states she initially became depressed in May of 2014 when she and her sisters began having issues regarding expenses for the family home. 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 Patient reports lifelong perfectionistic tendencies and says anxiety has worsened stating that she tends to think too much. Patient's symptoms included depressed mood, anxiety, racing thoughts, ruminating thoughts, sleep difficulty (2-3 hours of sleep per night) excessive worrying, crying spells, loss of interest in activities, and loss of appetite.   Patient reports continued depressed mood and anxiety. She reports her cousin died of cancer earlier this week. His funeral is tomorrow and is being held at the same church where patient's mother's funeral was 3 years ago. This will be the first time patient has attended that church since mother's  funeral. She continues to experience grief and loss issues regarding mother. She also continues to express frustration and worry about sisters and family not being close or being with each other like they did when mother was alive.   Suicidal/Homicidal: no  Therapist Response: Therapist works with patient to process grief and loss issues, identify support system, identify coping statements, and ways to use her value for family to cope   Plan: Return again in 2 weeks. Patient agrees to use plan your day handouts and bring to next session.  Diagnosis:Axis I: MDD, GAD  Axis II: No diagnosis    BYNUM,PEGGY, LCSW 04/16/2013

## 2014-04-17 LAB — CBC WITH DIFFERENTIAL/PLATELET
Basophils Absolute: 0 10*3/uL (ref 0.0–0.1)
Basophils Relative: 0 % (ref 0–1)
Eosinophils Absolute: 0.3 10*3/uL (ref 0.0–0.7)
Eosinophils Relative: 3 % (ref 0–5)
HCT: 30 % — ABNORMAL LOW (ref 36.0–46.0)
Hemoglobin: 9.6 g/dL — ABNORMAL LOW (ref 12.0–15.0)
Lymphocytes Relative: 22 % (ref 12–46)
Lymphs Abs: 2.4 10*3/uL (ref 0.7–4.0)
MCH: 22.2 pg — ABNORMAL LOW (ref 26.0–34.0)
MCHC: 32 g/dL (ref 30.0–36.0)
MCV: 69.4 fL — ABNORMAL LOW (ref 78.0–100.0)
MPV: 9.2 fL (ref 8.6–12.4)
Monocytes Absolute: 0.4 10*3/uL (ref 0.1–1.0)
Monocytes Relative: 4 % (ref 3–12)
Neutro Abs: 7.7 10*3/uL (ref 1.7–7.7)
Neutrophils Relative %: 71 % (ref 43–77)
Platelets: 365 10*3/uL (ref 150–400)
RBC: 4.32 MIL/uL (ref 3.87–5.11)
RDW: 16.6 % — ABNORMAL HIGH (ref 11.5–15.5)
WBC: 10.8 10*3/uL — ABNORMAL HIGH (ref 4.0–10.5)

## 2014-04-17 LAB — BASIC METABOLIC PANEL
BUN: 33 mg/dL — ABNORMAL HIGH (ref 6–23)
CO2: 21 mEq/L (ref 19–32)
Calcium: 9 mg/dL (ref 8.4–10.5)
Chloride: 103 mEq/L (ref 96–112)
Creat: 2.74 mg/dL — ABNORMAL HIGH (ref 0.50–1.10)
Glucose, Bld: 115 mg/dL — ABNORMAL HIGH (ref 70–99)
Potassium: 3.8 mEq/L (ref 3.5–5.3)
Sodium: 138 mEq/L (ref 135–145)

## 2014-04-17 LAB — FERRITIN: Ferritin: 161 ng/mL (ref 10–291)

## 2014-04-19 ENCOUNTER — Encounter (HOSPITAL_COMMUNITY): Payer: Self-pay | Admitting: Physical Therapy

## 2014-04-19 NOTE — Telephone Encounter (Signed)
Called patient, no answer 

## 2014-04-19 NOTE — Telephone Encounter (Signed)
Patient is aware 

## 2014-04-20 NOTE — Telephone Encounter (Signed)
Patient has declined appt with Dr Caprice Beaver and PCP has arranged referral to Dr Cyril Mourning in [facility]

## 2014-04-21 ENCOUNTER — Ambulatory Visit (HOSPITAL_COMMUNITY)
Admission: RE | Admit: 2014-04-21 | Discharge: 2014-04-21 | Disposition: A | Discharge status: Home / Self Care | Payer: BLUE CROSS/BLUE SHIELD | Source: Ambulatory Visit | Attending: Orthopaedic Surgery | Admitting: Orthopaedic Surgery

## 2014-04-22 NOTE — Therapy (Signed)
[place] [address] [place], [place], [address] Phone: [phone]   Fax:  [phone]  Patient Details  Name: April Ward MRN: [id number] Date of Birth: 04-07-1960 Referring Provider:  Sanjuana Kava, MD  Encounter Date: 04/21/2014   PHYSICAL THERAPY DISCHARGE SUMMARY  Visits from Start of Care: 2  Current functional level related to goals / functional outcomes:  PT SHORT TERM GOAL #1    Title Patient will dmeosntrate increased ankle dorsiflexion to 18 degrees   Time 4   Period Weeks   Status New   PT SHORT TERM GOAL #2   Title Patient will demonstrate increased hip internal rotation to 15 degrees on Rt to be able to improve deceleration mechanics.   Time 4   Period Weeks   Status New   PT SHORT TERM GOAL #3   Title Patient will demonstrate increased ankle plantar flexion of 3/5 to be bale to come up on toes withotu UE support   Time 4   Period Weeks   Status New   PT SHORT TERM GOAL #4   Title Patient will dmeonstrate decreased pain to <3/10 while standing <1 hour.    Time 4   Period Weeks   Status New           PT Long Term Goals - 04/08/14 1917    PT LONG TERM GOAL #1   Title Patient will demonstrate increased hip internal rotation to >25 degrees on Rt to be able to improve deceleration mechanics   Time 8   Period Weeks   Status New   PT LONG TERM GOAL #2   Title Patient will demonstrate increased ankle plantar flexion of 4/5 to be bale to come up on toes without UE support   Time 8   Period Weeks   Status New   PT LONG TERM GOAL #3   Title Patient will dmeonstrate decreased pain to <2/10 while standing <2 hour.    Time 8   Period Weeks   Status New   PT LONG TERM GOAL #4   Title Patient will be independent with HEP.    Time 8   Period Weeks   Status New                   Plan: Patient agrees to discharge.  Patient goals were not met. Patient is being discharged due to not returning since the last visit.  ?????       Leia Alf 04/22/2014, 3:19 PM  [place] [address] [place], [place], [address] Phone: [phone]   Fax:  [phone]

## 2014-04-23 ENCOUNTER — Ambulatory Visit (HOSPITAL_COMMUNITY): Payer: Self-pay | Admitting: Psychiatry

## 2014-04-23 ENCOUNTER — Encounter (HOSPITAL_COMMUNITY): Payer: Self-pay

## 2014-04-26 ENCOUNTER — Ambulatory Visit (HOSPITAL_COMMUNITY): Payer: Self-pay | Admitting: Psychiatry

## 2014-04-26 ENCOUNTER — Encounter (HOSPITAL_COMMUNITY): Payer: Self-pay | Admitting: Physical Therapy

## 2014-04-26 DIAGNOSIS — K219 Gastro-esophageal reflux disease without esophagitis: Secondary | ICD-10-CM | POA: Insufficient documentation

## 2014-04-27 ENCOUNTER — Encounter (HOSPITAL_COMMUNITY): Payer: Self-pay | Admitting: Psychiatry

## 2014-04-27 ENCOUNTER — Ambulatory Visit (INDEPENDENT_AMBULATORY_CARE_PROVIDER_SITE_OTHER): Payer: BLUE CROSS/BLUE SHIELD | Admitting: Psychiatry

## 2014-04-27 VITALS — BP 161/83 | HR 64 | Ht 71.5 in | Wt 301.0 lb

## 2014-04-27 DIAGNOSIS — F322 Major depressive disorder, single episode, severe without psychotic features: Secondary | ICD-10-CM

## 2014-04-27 DIAGNOSIS — F411 Generalized anxiety disorder: Secondary | ICD-10-CM

## 2014-04-27 MED ORDER — QUETIAPINE FUMARATE 50 MG PO TABS: 50.0000 mg | ORAL_TABLET | Freq: Every day | ORAL | Status: DC

## 2014-04-27 MED ORDER — DIAZEPAM 10 MG PO TABS: 10.0000 mg | ORAL_TABLET | Freq: Four times a day (QID) | ORAL | Status: DC

## 2014-04-27 MED ORDER — BUPROPION HCL ER (XL) 150 MG PO TB24: 150.0000 mg | ORAL_TABLET | ORAL | Status: DC

## 2014-04-27 MED ORDER — PRAZOSIN HCL 5 MG PO CAPS: 5.0000 mg | ORAL_CAPSULE | Freq: Every day | ORAL | Status: DC

## 2014-04-27 MED ORDER — CARBAMAZEPINE 200 MG PO TABS: 400.0000 mg | ORAL_TABLET | Freq: Every day | ORAL | Status: DC

## 2014-04-27 NOTE — Progress Notes (Signed)
Patient ID: April Ward, female   DOB: 1960-05-11, 54 y.o.   MRN: [id number] Patient ID: April Ward, female   DOB: 1960-05-13, 54 y.o.   MRN: [id number] Patient ID: April Ward, female   DOB: 1960-07-23, 54 y.o.   MRN: [id number] Patient ID: April Ward, female   DOB: [birth year], 54 y.o.   MRN: [id number] Patient ID: April Ward, female   DOB: [birth year], 54 y.o.   MRN: [id number] Patient ID: April Ward, female   DOB: 1960-09-19, 54 y.o.   MRN: [id number] Patient ID: April Ward, female   DOB: [birth year], 54 y.o.   MRN: [id number]  Psychiatric Assessment Adult  Patient Identification:  April Ward Date of Evaluation:  04/27/2014 Chief Complaint: "Too many people have died History of Chief Complaint:   Chief Complaint  Patient presents with  . Depression  . Anxiety  . Follow-up    Anxiety Symptoms include decreased concentration, nervous/anxious behavior and suicidal ideas.     this patient is a 54 year old married black female who lives with her husband, and 28 year old son and 65 year-old nephew  In [place]. 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 The patient states that she had a cyst removed in her left kidney in December. After she had the surgery initially she was told everything was okay. She later found that 60% of her kidney was removed and that she had renal cancer. She's very upset because of the conflicting information. She also developed severe anemia and probably will have to on iron infusion. She now doesn't trust the doctors because she doesn't know what to believe. She is scheduled to see an oncologist to make sure she doesn't need any more treatment for cancer. Her nephrologist so has released her and states she needs to come back in 6 months.  The patient travels 3 hours to her job and stays  there in an apartment. She's been doing this since June. Now however she doesn't feel able to function. She's been very depressed since the surgery and the news about the cancer. She's been crying all the time. Her thoughts are racing and she is unable to sleep. She's very tired and doesn't have any appetite. She's had passive suicidal ideation but no specific plan. She doesn't want to be around people because she gets so upset.  The patient had a depressive episode in May. At that time her sisters were upsetting her. Her mother died in Jul 09, 2011 and left her property to the patient and her 2 sisters. 2 sisters are not good about paying bills and the light bill is in her name. Her primary doctor had put her on  Vibryd which helped but her insurance did not cover it so she is no longer taking it. She's never been on any other antidepressants or had previous psychiatric treatment  The patient returns after one month. 2 months. The holidays were difficult for her because she missed her deceased parents. Now her cousin recently died and her uncle is in the hospital close to death. She's been tearful and crying a lot and spending a lot more time in bed. She's also forgetful and left the stove on and left the house. I told her to be very careful about the use of the Valium. She does feel that medications are  helpful to some degree and she is just now return to counseling. She agrees that when the weather gets better she will try to get outside and walk. She no longer has thoughts of hurting self or others Review of Systems  Constitutional: Positive for appetite change and fatigue.  Psychiatric/Behavioral: Positive for suicidal ideas, sleep disturbance, dysphoric mood and decreased concentration. The patient is nervous/anxious.    Physical Exam not done Depressive Symptoms: depressed mood, anhedonia, insomnia, psychomotor retardation, fatigue, feelings of worthlessness/guilt, hopelessness, suicidal thoughts  without plan, anxiety,  (Hypo) Manic Symptoms:   Elevated Mood:  No Irritable Mood:  No Grandiosity:  No Distractibility:  No Labiality of Mood:  No Delusions:  No Hallucinations:  No Impulsivity:  No Sexually Inappropriate Behavior:  No Financial Extravagance:  No Flight of Ideas:  No  Anxiety Symptoms: Excessive Worry:  Yes Panic Symptoms:  Yes Agoraphobia:  Yes Obsessive Compulsive: No  Symptoms: None, Specific Phobias:  No Social Anxiety:  Yes  Psychotic Symptoms:  Hallucinations: No None Delusions:  No Paranoia:  No   Ideas of Reference:  No  PTSD Symptoms: Ever had a traumatic exposure:  No Had a traumatic exposure in the last month:  No Re-experiencing: No None Hypervigilance:  No Hyperarousal: No None Avoidance: No None  Traumatic Brain Injury: No   Past Psychiatric History: Diagnosis: Maj. depression   Hospitalizations: None   Outpatient Care: None   Substance Abuse Care: None   Self-Mutilation: None   Suicidal Attempts: None   Violent Behaviors: None    Past Medical History:   Past Medical History  Diagnosis Date  . Hypertension   . Diabetes mellitus without complication   . Heart murmur   . Depression   . Anxiety   . GERD (gastroesophageal reflux disease)   . Arthritis   . Gout   . Renal disease 10/2012  . Renal insufficiency   . Renal cancer    History of Loss of Consciousness:  No Seizure History:  No Cardiac History:  No Allergies:   Allergies  Allergen Reactions  . Skelaxin [Metaxalone] Hives and Rash   Current Medications:  Current Outpatient Prescriptions  Medication Sig Dispense Refill  . amLODipine (NORVASC) 5 MG tablet Take 5 mg by mouth daily.    Marland Kitchen aspirin EC 81 MG tablet Take 1 tablet (81 mg total) by mouth daily.    Marland Kitchen atorvastatin (LIPITOR) 40 MG tablet Take 1 tablet (40 mg total) by mouth daily. 30 tablet 6  . buPROPion (WELLBUTRIN XL) 150 MG 24 hr tablet Take 1 tablet (150 mg total) by mouth every morning. 30  tablet 2  . carbamazepine (TEGRETOL) 200 MG tablet Take 2 tablets (400 mg total) by mouth daily. 60 tablet 2  . cloNIDine (CATAPRES - DOSED IN MG/24 HR) 0.1 mg/24hr patch Place 1 patch (0.1 mg total) onto the skin once a week. 4 patch 6  . colchicine 0.6 MG tablet Take 0.6 mg by mouth as needed (gout).     . diazepam (VALIUM) 10 MG tablet Take 1 tablet (10 mg total) by mouth 4 (four) times daily. 120 tablet 2  . fluticasone (FLONASE) 50 MCG/ACT nasal spray Place 2 sprays into both nostrils at bedtime. 16 g 0  . furosemide (LASIX) 40 MG tablet Take 1 tablet (40 mg total) by mouth 2 (two) times daily. 30 tablet 3  . hydrALAZINE (APRESOLINE) 100 MG tablet Take 1 tablet (100 mg total) by mouth 3 (three) times daily. 90 tablet 3  .  ipratropium (ATROVENT) 0.06 % nasal spray Place 2 sprays into both nostrils 4 (four) times daily. 15 mL 12  . labetalol (NORMODYNE) 200 MG tablet Take 2 tablets (400 mg total) by mouth 2 (two) times daily. 270 tablet 6  . linagliptin (TRADJENTA) 5 MG TABS tablet Take 5 mg by mouth daily.    Marland Kitchen losartan (COZAAR) 50 MG tablet Take 75 mg by mouth daily.     Marland Kitchen omeprazole (PRILOSEC) 20 MG capsule Take 20 mg by mouth daily.    . prazosin (MINIPRESS) 5 MG capsule Take 1 capsule (5 mg total) by mouth at bedtime. 30 capsule 2  . QUEtiapine (SEROQUEL) 50 MG tablet Take 1 tablet (50 mg total) by mouth at bedtime. 30 tablet 2  . ULORIC 40 MG tablet Take 40 mg by mouth daily.     . cyclobenzaprine (FLEXERIL) 5 MG tablet Take 1 tablet (5 mg total) by mouth 3 (three) times daily as needed for muscle spasms. (Patient not taking: Reported on 04/27/2014) 30 tablet 0  . oxyCODONE-acetaminophen (PERCOCET) 7.5-325 MG per tablet Take 1 tablet by mouth every 4 (four) hours as needed for pain. (Patient not taking: Reported on 04/27/2014) 20 tablet 0   No current facility-administered medications for this visit.    Previous Psychotropic Medications:  Medication Dose   Xanax   0.5 mg 3 times a day    Vibryd  40 mg every morning                   Substance Abuse History in the last 12 months: Substance Age of 1st Use Last Use Amount Specific Type  Nicotine      Alcohol      Cannabis      Opiates      Cocaine      Methamphetamines      LSD      Ecstasy      Benzodiazepines      Caffeine      Inhalants      Others:                          Medical Consequences of Substance Abuse:n/a  Legal Consequences of Substance Abuse: n/a  Family Consequences of Substance Abuse: n/a  Blackouts:  No DT's:  No Withdrawal Symptoms:  No None  Social History: Current Place of Residence: [place] of Birth: [place] Family Members: Husband 2 children, one nephew 4 grandchildren Marital Status:  Married Children:   Sons: 1  Daughters: 1 Relationships:  Education:  Dentist Problems/Performance:  Religious Beliefs/Practices: Christian History of Abuse: none Occupational Experiences; 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 Legal History: None Hobbies/Interests: Traveling, visiting with grandchildren  Family History:   Family History  Problem Relation Age of Onset  . Heart attack Mother   . Heart attack Father   . Depression Paternal Aunt   . Alcohol abuse Maternal Uncle   . Colon cancer Maternal Aunt   . Colon cancer Maternal Uncle     Mental Status Examination/Evaluation: Objective:  Appearance: Casual and Fairly Groomed  Engineer, water::  Fair  Speech:  Clear and Coherent  Volume:  Normal  Mood:  Sad and tearful   Affect:  Depressed , anxious   Thought Process:  Intact  Orientation:  Full (Time, Place, and Person)  Thought Content:  WDL  Suicidal Thoughts:no  Homicidal Thoughts:no  Judgement:  Intact  Insight:  Good  Psychomotor Activity:  Normal  Akathisia:  No  Handed:  Right  AIMS (if indicated):    Assets:  Communication Skills Desire for Improvement    Laboratory/X-Ray  Psychological Evaluation(s)       Assessment:  Axis I: Generalized Anxiety Disorder and Major Depression, Recurrent severe  AXIS I Generalized Anxiety Disorder and Major Depression, Recurrent severe  AXIS II Deferred  AXIS III Past Medical History  Diagnosis Date  . Hypertension   . Diabetes mellitus without complication   . Heart murmur   . Depression   . Anxiety   . GERD (gastroesophageal reflux disease)   . Arthritis   . Gout   . Renal disease 10/2012  . Renal insufficiency   . Renal cancer      AXIS IV other psychosocial or environmental problems  AXIS V 51-60 moderate symptoms   Treatment Plan/Recommendations:  Plan of Care: Medication management   Laboratory:   Psychotherapy: The patient is seeing Maurice Small here and will reschedule with her   Medications: She'll continue her current medications   Routine PRN Medications:  No  Consultations:   Safety Concerns:  She contracts for safety   Other:    return in 2 months She's not stable enough to return to employment     [name], MD 1/26/20161:39 PM

## 2014-04-28 ENCOUNTER — Encounter (HOSPITAL_COMMUNITY): Payer: Self-pay | Admitting: Physical Therapy

## 2014-04-30 ENCOUNTER — Encounter (HOSPITAL_COMMUNITY): Payer: Self-pay | Admitting: Physical Therapy

## 2014-04-30 ENCOUNTER — Ambulatory Visit (INDEPENDENT_AMBULATORY_CARE_PROVIDER_SITE_OTHER): Payer: BLUE CROSS/BLUE SHIELD | Admitting: Psychiatry

## 2014-04-30 DIAGNOSIS — F322 Major depressive disorder, single episode, severe without psychotic features: Secondary | ICD-10-CM

## 2014-04-30 DIAGNOSIS — F411 Generalized anxiety disorder: Secondary | ICD-10-CM
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
## 2014-04-30 NOTE — Patient Instructions (Signed)
Discussed orally 

## 2014-05-04 DIAGNOSIS — M76829 Posterior tibial tendinitis, unspecified leg: Secondary | ICD-10-CM | POA: Insufficient documentation

## 2014-05-06 ENCOUNTER — Telehealth: Payer: Self-pay | Admitting: Gastroenterology

## 2014-05-06 NOTE — Telephone Encounter (Signed)
PLEASE CALL PT. HER BLOOD COUNT REMAINS LOW BUT STABLE AT 9.6. HER IRON STORES ARE NORMAL. HER KIDNEY FUNCTION IS SLIGHTLY IMPROVED BUT REDUCED.   SHE NEEDS AN EGD W/ MAC TO LOOK FOR A GI REASON FOR HER LOW BLOOD COUNT(OBSCURE GI BLEED/ANEMIA).

## 2014-05-10 NOTE — Telephone Encounter (Signed)
Pt is aware of results. She stated that she had a swallowing test done [facility] in Jan with Dr. Florene Glen. They told her everything looked good. Do we still need to move forward with the EGD. Please advice

## 2014-05-11 ENCOUNTER — Ambulatory Visit (HOSPITAL_COMMUNITY): Payer: Self-pay | Admitting: Psychiatry

## 2014-05-14 NOTE — Telephone Encounter (Signed)
PLEASE CALL PT. AN UPPER ENDOSCOPY IS NOT  A SWALLOWING STUDY. SHE NEEDS AN UPPER ENDOSCOPY, THE CAMERA WITH THE LIGHT, TO LOOK IN HER UPPER GI TRACT BECAUSE SHE HAS A LOW BLOOD COUNT.

## 2014-05-17 ENCOUNTER — Ambulatory Visit (HOSPITAL_COMMUNITY): Payer: Self-pay | Admitting: Psychiatry

## 2014-05-19 NOTE — Telephone Encounter (Signed)
REVIEWED-NO ADDITIONAL RECOMMENDATIONS. 

## 2014-05-19 NOTE — Telephone Encounter (Signed)
Pt said she had surgery for an injured tendon on Friday and has a cast now. She will not be able to do the EGD for at least 6 weeks.  She wants me to call her at the end of March and see if she can schedule then. She is on my recall list for then.

## 2014-05-26 ENCOUNTER — Ambulatory Visit (HOSPITAL_COMMUNITY): Payer: Self-pay | Admitting: Psychiatry

## 2014-05-27 ENCOUNTER — Ambulatory Visit (INDEPENDENT_AMBULATORY_CARE_PROVIDER_SITE_OTHER): Payer: BLUE CROSS/BLUE SHIELD | Admitting: Psychiatry

## 2014-05-27 DIAGNOSIS — F322 Major depressive disorder, single episode, severe without psychotic features: Secondary | ICD-10-CM

## 2014-05-27 DIAGNOSIS — F411 Generalized anxiety disorder: Secondary | ICD-10-CM

## 2014-05-27 NOTE — Patient Instructions (Signed)
Discussed orally 

## 2014-05-27 NOTE — Progress Notes (Signed)
         THERAPIST PROGRESS NOTE  Session Time: Thursday 05/27/2014 3:15 PM - 4:00 PM  Participation Level: Active  Behavioral Response: Casual/Alert/ lessAnxious and less Depressed  Type of Therapy: Individual Therapy  Treatment Goals addressed:Alleviate symptoms of depression (depressed mood,  feelings of hopelessness/helplessness, loss of interest in activities, isolative behaviors, irritability, crying spells) and return to previous level of functioning Develop healthy thinking patterns about self, others, and the world. Implement coping skills reducing anxiety, excessive worry, and improving daily functioning Process grief and loss issues related to changed functioning and loss of job  Interventions: CBT and Supportive  Summary: April Ward is a 54 y.o. female who presents with symptoms of depression and anxiety. She states she initially became depressed in May of 2014 when she and her sisters began having issues regarding expenses for the family home. 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 Patient reports lifelong perfectionistic tendencies and says anxiety has worsened stating that she tends to think too much. Patient's symptoms included depressed mood, anxiety, racing thoughts, ruminating thoughts, sleep difficulty (2-3 hours of sleep per night) excessive worrying, crying spells, loss of interest in activities, and loss of appetite.   Patient reports decreased anxiety and improved mood since last session. She rates both anxiety and depression at  5 on 10 point scale with 10 being severe. She had started becoming more active before having surgery on her right leg to do a tendon transfer. She is wearing a cast today and  uses a knee scooter. She is unable to drive for at least 6 weeks. However, she reports having strong support from her family who provide transportation and take care of household responsibilities. She expresses frustration that she is unable to do much around the house. Patient states she has felt happier. She is pleasant and talkative during today's session. She denies any suicidal and homicidal ideations. She is working with an attorney regarding issues pertaining to her former employer and states trying not to let things worry her. She also reports trying to use the same approach in other areas in her life and states trying to focus on positive things.  Suicidal/Homicidal: Patient admits having homicidal ideations 2 weeks ago but denies having any since that time. She denies current homicidal ideations. She denies any plan or intent to harm anyone or herself.  She denies any suicidal ideations since last session. Patient agrees to cal this practice, call 911, or have someone take her to the ER should symptoms worsen.  Therapist Response: Therapist works with patient to identify and process feelings, identify activities patient can do when at home, identify ways to use support system, identify coping statements  Plan: Return again in 2 weeks. Patient agrees to use plan your day handouts, symptom rating forms,  and bring to next session.  Diagnosis:Axis I: MDD, GAD  Axis II: No diagnosis    BYNUM,PEGGY, LCSW 04/16/2013

## 2014-06-22 ENCOUNTER — Encounter: Payer: Self-pay | Admitting: Gastroenterology

## 2014-06-25 ENCOUNTER — Ambulatory Visit (INDEPENDENT_AMBULATORY_CARE_PROVIDER_SITE_OTHER): Payer: BLUE CROSS/BLUE SHIELD | Admitting: Psychiatry

## 2014-06-25 ENCOUNTER — Encounter (HOSPITAL_COMMUNITY): Payer: Self-pay | Admitting: Psychiatry

## 2014-06-25 VITALS — Ht 71.0 in | Wt 282.0 lb

## 2014-06-25 DIAGNOSIS — F322 Major depressive disorder, single episode, severe without psychotic features: Secondary | ICD-10-CM | POA: Diagnosis not present

## 2014-06-25 DIAGNOSIS — F411 Generalized anxiety disorder: Secondary | ICD-10-CM

## 2014-06-25 MED ORDER — DIAZEPAM 10 MG PO TABS: 10.0000 mg | ORAL_TABLET | Freq: Four times a day (QID) | ORAL | Status: DC

## 2014-06-25 MED ORDER — PRAZOSIN HCL 5 MG PO CAPS: 5.0000 mg | ORAL_CAPSULE | Freq: Every day | ORAL | Status: DC

## 2014-06-25 MED ORDER — BUPROPION HCL ER (XL) 150 MG PO TB24: 150.0000 mg | ORAL_TABLET | ORAL | Status: DC

## 2014-06-25 MED ORDER — QUETIAPINE FUMARATE 50 MG PO TABS: 50.0000 mg | ORAL_TABLET | Freq: Every day | ORAL | Status: DC

## 2014-06-25 MED ORDER — CARBAMAZEPINE 200 MG PO TABS: 400.0000 mg | ORAL_TABLET | Freq: Every day | ORAL | Status: DC

## 2014-06-25 NOTE — Progress Notes (Signed)
Patient ID: April Ward, female   DOB: Feb 25, 1961, 54 y.o.   MRN: [id number] Patient ID: April Ward, female   DOB: 1960-06-10, 54 y.o.   MRN: [id number] Patient ID: April Ward, female   DOB: [birth year], 54 y.o.   MRN: [id number] Patient ID: ANERI Ward, female   DOB: 1960/05/01, 53 y.o.   MRN: [id number] Patient ID: April Ward, female   DOB: [birth year], 54 y.o.   MRN: [id number] Patient ID: April Ward, female   DOB: Aug 01, 1960, 54 y.o.   MRN: [id number] Patient ID: April Ward, female   DOB: Oct 20, 1960, 54 y.o.   MRN: [id number] Patient ID: April Ward, female   DOB: [birth year], 55 y.o.   MRN: [id number]  Psychiatric Assessment Adult  Patient Identification:  [name] Date of Evaluation:  06/25/2014 Chief Complaint: "I'm doing a little better" History of Chief Complaint:   Chief Complaint  Patient presents with  . Depression  . Anxiety  . Follow-up    Anxiety Symptoms include decreased concentration, nervous/anxious behavior and suicidal ideas.     this patient is a 55 year old married black female who lives with her husband, and 49 year old son and 70 year-old nephew  In [place]. 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 The patient states that she had a cyst removed in her left kidney in December. After she had the surgery initially she was told everything was okay. She later found that 60% of her kidney was removed and that she had renal cancer. She's very upset because of the conflicting information. She also developed severe anemia and probably will have to on iron infusion. She now doesn't trust the doctors because she doesn't know what to believe. She is scheduled to see an oncologist to make sure she doesn't need any more treatment for cancer. Her nephrologist so has released her and states she needs to  come back in 6 months.  The patient travels 3 hours to her job and stays there in an apartment. She's been doing this since June. Now however she doesn't feel able to function. She's been very depressed since the surgery and the news about the cancer. She's been crying all the time. Her thoughts are racing and she is unable to sleep. She's very tired and doesn't have any appetite. She's had passive suicidal ideation but no specific plan. She doesn't want to be around people because she gets so upset.  The patient had a depressive episode in May. At that time her sisters were upsetting her. Her mother died in 2011-07-09 and left her property to the patient and her 2 sisters. 2 sisters are not good about paying bills and the light bill is in her name. Her primary doctor had put her on  Vibryd which helped but her insurance did not cover it so she is no longer taking it. She's never been on any other antidepressants or had previous psychiatric treatment  The patient returns after 2 months. She's now on long-term disability from her job and has applied for [employer] disability. She feels somewhat better now that she's not having to go into work. She had surgery on a ruptured tendon in her right foot and is currently in a cast and using a rolling walker. Her general health is improving and she is losing  some weight and her renal function is better. Her nephrologist would like her to get gastric bypass surgery at some point. Her mood is improved although she still has some crying spells because she's had 2 cousins and an uncle died in the last several months. She does note some memory loss and explained her this is probably from Valium and over the next few months we need to try to work on decreasing it. She denies any thoughts of harm to self or others Review of Systems  Constitutional: Positive for appetite change and fatigue.  Psychiatric/Behavioral: Positive for suicidal ideas, sleep disturbance, dysphoric  mood and decreased concentration. The patient is nervous/anxious.    Physical Exam not done Depressive Symptoms: depressed mood, anhedonia, insomnia, psychomotor retardation, fatigue, feelings of worthlessness/guilt, hopelessness, suicidal thoughts without plan, anxiety,  (Hypo) Manic Symptoms:   Elevated Mood:  No Irritable Mood:  No Grandiosity:  No Distractibility:  No Labiality of Mood:  No Delusions:  No Hallucinations:  No Impulsivity:  No Sexually Inappropriate Behavior:  No Financial Extravagance:  No Flight of Ideas:  No  Anxiety Symptoms: Excessive Worry:  Yes Panic Symptoms:  Yes Agoraphobia:  Yes Obsessive Compulsive: No  Symptoms: None, Specific Phobias:  No Social Anxiety:  Yes  Psychotic Symptoms:  Hallucinations: No None Delusions:  No Paranoia:  No   Ideas of Reference:  No  PTSD Symptoms: Ever had a traumatic exposure:  No Had a traumatic exposure in the last month:  No Re-experiencing: No None Hypervigilance:  No Hyperarousal: No None Avoidance: No None  Traumatic Brain Injury: No   Past Psychiatric History: Diagnosis: Maj. depression   Hospitalizations: None   Outpatient Care: None   Substance Abuse Care: None   Self-Mutilation: None   Suicidal Attempts: None   Violent Behaviors: None    Past Medical History:   Past Medical History  Diagnosis Date  . Hypertension   . Diabetes mellitus without complication   . Heart murmur   . Depression   . Anxiety   . GERD (gastroesophageal reflux disease)   . Arthritis   . Gout   . Renal disease 10/2012  . Renal insufficiency   . Renal cancer    History of Loss of Consciousness:  No Seizure History:  No Cardiac History:  No Allergies:   Allergies  Allergen Reactions  . Skelaxin [Metaxalone] Hives and Rash   Current Medications:  Current Outpatient Prescriptions  Medication Sig Dispense Refill  . amLODipine (NORVASC) 5 MG tablet Take 5 mg by mouth daily.    Marland Kitchen aspirin EC 81 MG  tablet Take 1 tablet (81 mg total) by mouth daily.    Marland Kitchen atorvastatin (LIPITOR) 40 MG tablet Take 1 tablet (40 mg total) by mouth daily. 30 tablet 6  . buPROPion (WELLBUTRIN XL) 150 MG 24 hr tablet Take 1 tablet (150 mg total) by mouth every morning. 30 tablet 2  . carbamazepine (TEGRETOL) 200 MG tablet Take 2 tablets (400 mg total) by mouth daily. 60 tablet 2  . cloNIDine (CATAPRES - DOSED IN MG/24 HR) 0.1 mg/24hr patch Place 1 patch (0.1 mg total) onto the skin once a week. 4 patch 6  . colchicine 0.6 MG tablet Take 0.6 mg by mouth as needed (gout).     . cyclobenzaprine (FLEXERIL) 5 MG tablet Take 1 tablet (5 mg total) by mouth 3 (three) times daily as needed for muscle spasms. (Patient not taking: Reported on 04/27/2014) 30 tablet 0  . diazepam (VALIUM) 10 MG tablet  Take 1 tablet (10 mg total) by mouth 4 (four) times daily. 120 tablet 2  . fluticasone (FLONASE) 50 MCG/ACT nasal spray Place 2 sprays into both nostrils at bedtime. 16 g 0  . furosemide (LASIX) 40 MG tablet Take 1 tablet (40 mg total) by mouth 2 (two) times daily. 30 tablet 3  . hydrALAZINE (APRESOLINE) 100 MG tablet Take 1 tablet (100 mg total) by mouth 3 (three) times daily. 90 tablet 3  . ipratropium (ATROVENT) 0.06 % nasal spray Place 2 sprays into both nostrils 4 (four) times daily. 15 mL 12  . labetalol (NORMODYNE) 200 MG tablet Take 2 tablets (400 mg total) by mouth 2 (two) times daily. 270 tablet 6  . linagliptin (TRADJENTA) 5 MG TABS tablet Take 5 mg by mouth daily.    Marland Kitchen losartan (COZAAR) 50 MG tablet Take 75 mg by mouth daily.     Marland Kitchen omeprazole (PRILOSEC) 20 MG capsule Take 20 mg by mouth daily.    Marland Kitchen oxyCODONE-acetaminophen (PERCOCET) 7.5-325 MG per tablet Take 1 tablet by mouth every 4 (four) hours as needed for pain. (Patient not taking: Reported on 04/27/2014) 20 tablet 0  . prazosin (MINIPRESS) 5 MG capsule Take 1 capsule (5 mg total) by mouth at bedtime. 30 capsule 2  . QUEtiapine (SEROQUEL) 50 MG tablet Take 1 tablet  (50 mg total) by mouth at bedtime. 30 tablet 2  . ULORIC 40 MG tablet Take 40 mg by mouth daily.      No current facility-administered medications for this visit.    Previous Psychotropic Medications:  Medication Dose   Xanax   0.5 mg 3 times a day   Vibryd  40 mg every morning                   Substance Abuse History in the last 12 months: Substance Age of 1st Use Last Use Amount Specific Type  Nicotine      Alcohol      Cannabis      Opiates      Cocaine      Methamphetamines      LSD      Ecstasy      Benzodiazepines      Caffeine      Inhalants      Others:                          Medical Consequences of Substance Abuse:n/a  Legal Consequences of Substance Abuse: n/a  Family Consequences of Substance Abuse: n/a  Blackouts:  No DT's:  No Withdrawal Symptoms:  No None  Social History: Current Place of Residence: [place] of Birth: [place] Family Members: Husband 2 children, one nephew 4 grandchildren Marital Status:  Married Children:   Sons: 1  Daughters: 1 Relationships:  Education:  Dentist Problems/Performance:  Religious Beliefs/Practices: Christian History of Abuse: none Occupational Experiences; 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 Legal History: None Hobbies/Interests: Traveling, visiting with grandchildren  Family History:   Family History  Problem Relation Age of Onset  . Heart attack Mother   . Heart attack Father   . Depression Paternal Aunt   . Alcohol abuse Maternal Uncle   . Colon cancer Maternal Aunt   . Colon cancer Maternal Uncle     Mental Status Examination/Evaluation: Objective:  Appearance: Casual and Fairly Groomed  Eye Contact::  Fair  Speech:  Clear and Coherent  Volume:  Normal  Mood:  good  Affect:  brighter  Thought Process:  Intact  Orientation:  Full (Time, Place, and Person)  Thought Content:  WDL  Suicidal Thoughts:no   Homicidal Thoughts:no  Judgement:  Intact  Insight:  Good  Psychomotor Activity:  Normal  Akathisia:  No  Handed:  Right  AIMS (if indicated):    Assets:  Communication Skills Desire for Improvement    Laboratory/X-Ray Psychological Evaluation(s)       Assessment:  Axis I: Generalized Anxiety Disorder and Major Depression, Recurrent severe  AXIS I Generalized Anxiety Disorder and Major Depression, Recurrent severe  AXIS II Deferred  AXIS III Past Medical History  Diagnosis Date  . Hypertension   . Diabetes mellitus without complication   . Heart murmur   . Depression   . Anxiety   . GERD (gastroesophageal reflux disease)   . Arthritis   . Gout   . Renal disease 10/2012  . Renal insufficiency   . Renal cancer      AXIS IV other psychosocial or environmental problems  AXIS V 51-60 moderate symptoms   Treatment Plan/Recommendations:  Plan of Care: Medication management   Laboratory:   Psychotherapy: The patient is seeing Maurice Small here and will reschedule with her   Medications: She'll continue her current medications   Routine PRN Medications:  No  Consultations:   Safety Concerns:  She contracts for safety   Other:    return in 2 months She's not stable enough to return to employment     [name], MD 3/25/201611:18 AM

## 2014-07-09 ENCOUNTER — Ambulatory Visit (INDEPENDENT_AMBULATORY_CARE_PROVIDER_SITE_OTHER): Payer: BLUE CROSS/BLUE SHIELD | Admitting: Psychiatry

## 2014-07-09 DIAGNOSIS — F322 Major depressive disorder, single episode, severe without psychotic features: Secondary | ICD-10-CM

## 2014-07-09 DIAGNOSIS — F411 Generalized anxiety disorder: Secondary | ICD-10-CM

## 2014-07-09 NOTE — Patient Instructions (Signed)
Discussed orally 

## 2014-07-09 NOTE — Progress Notes (Signed)
          THERAPIST PROGRESS NOTE  Session Time: Friday 07/09/2014 11:05 AM - 12:00 PM  Participation Level: Active  Behavioral Response: Casual/Alert/ lessAnxious and less Depressed  Type of Therapy: Individual Therapy  Treatment Goals addressed:Alleviate symptoms of depression (depressed mood,  feelings of hopelessness/helplessness, loss of interest in activities, isolative behaviors, irritability, crying spells) and return to previous level of functioning Develop healthy thinking patterns about self, others, and the world. Implement coping skills reducing anxiety, excessive worry, and improving daily functioning Process grief and loss issues related to changed functioning and loss of job  Interventions: CBT and Supportive  Summary: April Ward is a 54 y.o. female who presents with symptoms of depression and anxiety. She states she initially became depressed in May of 2014 when she and her sisters began having issues regarding expenses for the family home. 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 Patient reports lifelong perfectionistic tendencies and says anxiety has worsened stating that she tends to think too much. Patient's symptoms included depressed mood, anxiety, racing thoughts, ruminating thoughts, sleep difficulty (2-3 hours of sleep per night) excessive worrying, crying spells, loss of interest in activities, and loss of appetite.   Patient reports improved mood and increased involvement in activity since last session. She now is using a cast rather than the knee scooter. Patient reports being more active and beginning a walking program. She also has changed her eating patterns and has lost weight. She has increased  social involvement with family and friends along with attending church regularly. She is experiencing stress related to her son not being supportive of patient's bariatric surgery which is scheduled for Tuesday, 07/13/2014. Per patient's report, her son claims he is worried and has been crying as he is concerned that something may happen to mother. However, her main stressor is her concerns about her husband who has diabetes and has had test within the last 6 months that indicate glucose levels are not controlled and indicate an elevated reading for levels related to prostate. Patient expresses frustration, hurt, and fear as husband does not comply with the recommendations from Dr. regarding diet, exercise, and medication. He also has not attended follow-up appointments but does have an appointment on the day of patient's surgery.   Suicidal/Homicidal:No  Therapist Response: Therapist works with patient to identify and process feelings, identify thought patterns and effects on patient's mood and behavior, identify realistic expectations of self, identify ways to use support system, identify coping techniques  Plan: Return again in 2 weeks.   Diagnosis:Axis I: MDD, GAD  Axis II: No diagnosis    [name], LCSW 04/16/2013

## 2014-07-21 NOTE — Telephone Encounter (Signed)
REVIEWED-NO ADDITIONAL RECOMMENDATIONS. 

## 2014-07-21 NOTE — Telephone Encounter (Signed)
I called pt and she is still not ready to schedule the EGD. Said she will call when she is ready.

## 2014-07-30 ENCOUNTER — Ambulatory Visit (HOSPITAL_COMMUNITY): Payer: Self-pay | Admitting: Psychiatry

## 2014-08-19 ENCOUNTER — Ambulatory Visit (HOSPITAL_COMMUNITY): Payer: Self-pay | Admitting: Psychiatry

## 2014-08-25 ENCOUNTER — Ambulatory Visit (HOSPITAL_COMMUNITY): Payer: Self-pay | Admitting: Psychiatry

## 2014-09-24 ENCOUNTER — Ambulatory Visit (HOSPITAL_COMMUNITY): Payer: Self-pay | Admitting: Psychiatry

## 2014-09-24 ENCOUNTER — Encounter (HOSPITAL_COMMUNITY): Payer: Self-pay | Admitting: Psychiatry

## 2014-09-24 ENCOUNTER — Ambulatory Visit (INDEPENDENT_AMBULATORY_CARE_PROVIDER_SITE_OTHER): Payer: BLUE CROSS/BLUE SHIELD | Admitting: Psychiatry

## 2014-09-24 VITALS — Ht 71.0 in | Wt 244.0 lb

## 2014-09-24 DIAGNOSIS — F411 Generalized anxiety disorder: Secondary | ICD-10-CM | POA: Diagnosis not present

## 2014-09-24 DIAGNOSIS — F322 Major depressive disorder, single episode, severe without psychotic features: Secondary | ICD-10-CM

## 2014-09-24 MED ORDER — PRAZOSIN HCL 5 MG PO CAPS: 5.0000 mg | ORAL_CAPSULE | Freq: Every day | ORAL | Status: DC

## 2014-09-24 MED ORDER — CARBAMAZEPINE 200 MG PO TABS: 400.0000 mg | ORAL_TABLET | Freq: Every day | ORAL | Status: DC

## 2014-09-24 MED ORDER — QUETIAPINE FUMARATE 50 MG PO TABS: 50.0000 mg | ORAL_TABLET | Freq: Every day | ORAL | Status: DC

## 2014-09-24 MED ORDER — BUPROPION HCL 75 MG PO TABS: 75.0000 mg | ORAL_TABLET | Freq: Two times a day (BID) | ORAL | Status: DC

## 2014-09-24 MED ORDER — DIAZEPAM 10 MG PO TABS: 10.0000 mg | ORAL_TABLET | Freq: Four times a day (QID) | ORAL | Status: DC

## 2014-09-24 NOTE — Progress Notes (Signed)
Patient ID: April Ward, female   DOB: [birth year], 54 y.o.   MRN: [id number] Patient ID: April Ward, female   DOB: 06-05-1960, 54 y.o.   MRN: [id number] Patient ID: April Ward, female   DOB: October 22, 1960, 54 y.o.   MRN: [id number] Patient ID: April Ward, female   DOB: October 28, 1960, 54 y.o.   MRN: [id number] Patient ID: April Ward, female   DOB: 12-03-1960, 54 y.o.   MRN: [id number] Patient ID: April Ward, female   DOB: 31-Mar-1961, 54 y.o.   MRN: [id number] Patient ID: April Ward, female   DOB: Apr 20, 1960, 54 y.o.   MRN: [id number] Patient ID: April Ward, female   DOB: 1960/06/26, 54 y.o.   MRN: [id number] Patient ID: April Ward, female   DOB: [birth year], 54 y.o.   MRN: [id number]  Psychiatric Assessment Adult  Patient Identification:  April Ward Date of Evaluation:  09/24/2014 Chief Complaint: "I'm doing better " History of Chief Complaint:   Chief Complaint  Patient presents with  . Depression  . Anxiety  . Follow-up    Anxiety Symptoms include decreased concentration, nervous/anxious behavior and suicidal ideas.     this patient is a 54 year old married black female who lives with her husband, and 71 year old son and 6 year-old nephew  In [place]. 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 The patient states that she had a cyst removed in her left kidney in December. After she had the surgery initially she was told everything was okay. She later found that 60% of her kidney was removed and that she had renal cancer. She's very upset because of the conflicting information. She also developed severe anemia and probably will have to on iron infusion. She now doesn't trust the doctors because she doesn't know what to believe. She is scheduled to see an oncologist to make sure she doesn't need any more treatment for  cancer. Her nephrologist so has released her and states she needs to come back in 6 months.  The patient travels 3 hours to her job and stays there in an apartment. She's been doing this since June. Now however she doesn't feel able to function. She's been very depressed since the surgery and the news about the cancer. She's been crying all the time. Her thoughts are racing and she is unable to sleep. She's very tired and doesn't have any appetite. She's had passive suicidal ideation but no specific plan. She doesn't want to be around people because she gets so upset.  The patient had a depressive episode in May. At that time her sisters were upsetting her. Her mother died in 2011-07-25 and left her property to the patient and her 2 sisters. 2 sisters are not good about paying bills and the light bill is in her name. Her primary doctor had put her on  Vibryd which helped but her insurance did not cover it so she is no longer taking it. She's never been on any other antidepressants or had previous psychiatric treatment  The patient returns after 3 months. She had a gastric sleeve procedure done in April at [facility]. Since then she has lost 40 pounds. She's been able to get off most of her antihypertensives medicines and her diabetic medications. Her blood pressure has come down as has her blood  sugar. She feels a lot better. Her mood is fairly good but she still has some difficult times. She still awaiting her disability hearing. The physicians at [facility] stopped her Minipress at night probably thinking was for blood pressure but it does help prevent nightmares and she would like to go back on it. She denies any auditory or visualizations or racing thoughts Review of Systems  Constitutional: Positive for appetite change and fatigue.  Psychiatric/Behavioral: Positive for suicidal ideas, sleep disturbance, dysphoric mood and decreased concentration. The patient is nervous/anxious.    Physical Exam not  done Depressive Symptoms: depressed mood, anhedonia, insomnia, psychomotor retardation, fatigue, feelings of worthlessness/guilt, hopelessness, suicidal thoughts without plan, anxiety,  (Hypo) Manic Symptoms:   Elevated Mood:  No Irritable Mood:  No Grandiosity:  No Distractibility:  No Labiality of Mood:  No Delusions:  No Hallucinations:  No Impulsivity:  No Sexually Inappropriate Behavior:  No Financial Extravagance:  No Flight of Ideas:  No  Anxiety Symptoms: Excessive Worry:  Yes Panic Symptoms:  Yes Agoraphobia:  Yes Obsessive Compulsive: No  Symptoms: None, Specific Phobias:  No Social Anxiety:  Yes  Psychotic Symptoms:  Hallucinations: No None Delusions:  No Paranoia:  No   Ideas of Reference:  No  PTSD Symptoms: Ever had a traumatic exposure:  No Had a traumatic exposure in the last month:  No Re-experiencing: No None Hypervigilance:  No Hyperarousal: No None Avoidance: No None  Traumatic Brain Injury: No   Past Psychiatric History: Diagnosis: Maj. depression   Hospitalizations: None   Outpatient Care: None   Substance Abuse Care: None   Self-Mutilation: None   Suicidal Attempts: None   Violent Behaviors: None    Past Medical History:   Past Medical History  Diagnosis Date  . Hypertension   . Diabetes mellitus without complication   . Heart murmur   . Depression   . Anxiety   . GERD (gastroesophageal reflux disease)   . Arthritis   . Gout   . Renal disease 10/2012  . Renal insufficiency   . Renal cancer    History of Loss of Consciousness:  No Seizure History:  No Cardiac History:  No Allergies:   Allergies  Allergen Reactions  . Skelaxin [Metaxalone] Hives and Rash   Current Medications:  Current Outpatient Prescriptions  Medication Sig Dispense Refill  . buPROPion (WELLBUTRIN) 75 MG tablet Take 1 tablet (75 mg total) by mouth 2 (two) times daily. 60 tablet 2  . amLODipine (NORVASC) 5 MG tablet Take 5 mg by mouth daily.     Marland Kitchen aspirin EC 81 MG tablet Take 1 tablet (81 mg total) by mouth daily.    . carbamazepine (TEGRETOL) 200 MG tablet Take 2 tablets (400 mg total) by mouth daily. 60 tablet 2  . colchicine 0.6 MG tablet Take 0.6 mg by mouth as needed (gout).     . diazepam (VALIUM) 10 MG tablet Take 1 tablet (10 mg total) by mouth 4 (four) times daily. 120 tablet 2  . FERREX 150 150 MG capsule     . fluticasone (FLONASE) 50 MCG/ACT nasal spray Place 2 sprays into both nostrils at bedtime. 16 g 0  . ipratropium (ATROVENT) 0.06 % nasal spray Place 2 sprays into both nostrils 4 (four) times daily. 15 mL 12  . labetalol (NORMODYNE) 200 MG tablet Take 2 tablets (400 mg total) by mouth 2 (two) times daily. 270 tablet 6  . linagliptin (TRADJENTA) 5 MG TABS tablet Take 5 mg by mouth daily.    Marland Kitchen  losartan (COZAAR) 50 MG tablet Take 75 mg by mouth daily.     Marland Kitchen omeprazole (PRILOSEC) 20 MG capsule Take 20 mg by mouth daily.    . prazosin (MINIPRESS) 5 MG capsule Take 1 capsule (5 mg total) by mouth at bedtime. 30 capsule 2  . QUEtiapine (SEROQUEL) 50 MG tablet Take 1 tablet (50 mg total) by mouth at bedtime. 30 tablet 2  . ULORIC 40 MG tablet Take 40 mg by mouth daily.      No current facility-administered medications for this visit.    Previous Psychotropic Medications:  Medication Dose   Xanax   0.5 mg 3 times a day   Vibryd  40 mg every morning                   Substance Abuse History in the last 12 months: Substance Age of 1st Use Last Use Amount Specific Type  Nicotine      Alcohol      Cannabis      Opiates      Cocaine      Methamphetamines      LSD      Ecstasy      Benzodiazepines      Caffeine      Inhalants      Others:                          Medical Consequences of Substance Abuse:n/a  Legal Consequences of Substance Abuse: n/a  Family Consequences of Substance Abuse: n/a  Blackouts:  No DT's:  No Withdrawal Symptoms:  No None  Social History: Current Place of Residence:  [place] of Birth: [place] Family Members: Husband 2 children, one nephew 4 grandchildren Marital Status:  Married Children:   Sons: 1  Daughters: 1 Relationships:  Education:  Dentist Problems/Performance:  Religious Beliefs/Practices: Christian History of Abuse: none Occupational Experiences; 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 Legal History: None Hobbies/Interests: Traveling, visiting with grandchildren  Family History:   Family History  Problem Relation Age of Onset  . Heart attack Mother   . Heart attack Father   . Depression Paternal Aunt   . Alcohol abuse Maternal Uncle   . Colon cancer Maternal Aunt   . Colon cancer Maternal Uncle     Mental Status Examination/Evaluation: Objective:  Appearance: Casual and Fairly Groomed appears much thinner   Eye Contact::  Fair  Speech:  Clear and Coherent  Volume:  Normal  Mood:  good  Affect:  bright  Thought Process:  Intact  Orientation:  Full (Time, Place, and Person)  Thought Content:  WDL  Suicidal Thoughts:no  Homicidal Thoughts:no  Judgement:  Intact  Insight:  Good  Psychomotor Activity:  Normal  Akathisia:  No  Handed:  Right  AIMS (if indicated):    Assets:  Communication Skills Desire for Improvement    Laboratory/X-Ray Psychological Evaluation(s)       Assessment:  Axis I: Generalized Anxiety Disorder and Major Depression, Recurrent severe  AXIS I Generalized Anxiety Disorder and Major Depression, Recurrent severe  AXIS II Deferred  AXIS III Past Medical History  Diagnosis Date  . Hypertension   . Diabetes mellitus without complication   . Heart murmur   . Depression   . Anxiety   . GERD (gastroesophageal reflux disease)   . Arthritis   . Gout   . Renal disease 10/2012  . Renal  insufficiency   . Renal cancer      AXIS IV other psychosocial or environmental problems  AXIS V 51-60 moderate symptoms   Treatment  Plan/Recommendations:  Plan of Care: Medication management   Laboratory:   Psychotherapy: The patient is seeing Maurice Small here and will reschedule with her   Medications: She'll continue Wellbutrin for depression, Tegretol and Seroquel for mood stabilization and Valium for anxiety. He'll restart prazosin 5 mg at bedtime for nightmares   Routine PRN Medications:  No  Consultations:   Safety Concerns:  She contracts for safety   Other:    return in  3 months    [name], MD 6/24/20162:01 PM

## 2014-09-29 DIAGNOSIS — M7502 Adhesive capsulitis of left shoulder: Secondary | ICD-10-CM | POA: Insufficient documentation

## 2014-12-02 ENCOUNTER — Ambulatory Visit (INDEPENDENT_AMBULATORY_CARE_PROVIDER_SITE_OTHER): Payer: BLUE CROSS/BLUE SHIELD | Admitting: Psychiatry

## 2014-12-02 ENCOUNTER — Encounter (HOSPITAL_COMMUNITY): Payer: Self-pay | Admitting: Psychiatry

## 2014-12-02 DIAGNOSIS — F322 Major depressive disorder, single episode, severe without psychotic features: Secondary | ICD-10-CM

## 2014-12-02 DIAGNOSIS — F411 Generalized anxiety disorder: Secondary | ICD-10-CM | POA: Diagnosis not present

## 2014-12-02 MED ORDER — QUETIAPINE FUMARATE 50 MG PO TABS: 50.0000 mg | ORAL_TABLET | Freq: Every day | ORAL | Status: DC

## 2014-12-02 MED ORDER — PRAZOSIN HCL 5 MG PO CAPS: 5.0000 mg | ORAL_CAPSULE | Freq: Every day | ORAL | Status: DC

## 2014-12-02 MED ORDER — CARBAMAZEPINE 200 MG PO TABS: 400.0000 mg | ORAL_TABLET | Freq: Every day | ORAL | Status: DC

## 2014-12-02 MED ORDER — DIAZEPAM 10 MG PO TABS: 10.0000 mg | ORAL_TABLET | Freq: Four times a day (QID) | ORAL | Status: DC

## 2014-12-02 MED ORDER — BUPROPION HCL ER (XL) 300 MG PO TB24: 300.0000 mg | ORAL_TABLET | ORAL | Status: DC

## 2014-12-02 NOTE — Progress Notes (Signed)
Patient ID: April Ward, female   DOB: May 12, 1960, 54 y.o.   MRN: [id number] Patient ID: April Ward, female   DOB: December 03, 1960, 54 y.o.   MRN: [id number] Patient ID: April Ward, female   DOB: 1960-12-16, 54 y.o.   MRN: [id number] Patient ID: April Ward, female   DOB: [birth year], 54 y.o.   MRN: [id number] Patient ID: April Ward, female   DOB: Oct 21, 1960, 54 y.o.   MRN: [id number] Patient ID: April Ward, female   DOB: [birth year], 54 y.o.   MRN: [id number] Patient ID: April Ward, female   DOB: 02-16-1961, 54 y.o.   MRN: [id number] Patient ID: April Ward, female   DOB: Nov 02, 1960, 54 y.o.   MRN: [id number] Patient ID: April Ward, female   DOB: 02-01-1961, 54 y.o.   MRN: [id number] Patient ID: April Ward, female   DOB: April 29, 1960, 54 y.o.   MRN: [id number]  Psychiatric Assessment Adult  Patient Identification:  April Ward Date of Evaluation:  12/02/2014 Chief Complaint: "I'm doing better " History of Chief Complaint:   Chief Complaint  Patient presents with  . Depression  . Anxiety  . Follow-up    Depression        Associated symptoms include decreased concentration, fatigue, appetite change and suicidal ideas.  Past medical history includes anxiety.   Anxiety Symptoms include decreased concentration, nervous/anxious behavior and suicidal ideas.     this patient is a 54 year old married black female who lives with her husband, and 30 year old son and 16 year-old nephew  In [place]. 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 The patient states that she had a cyst removed in her left kidney in December. After she had the surgery initially she was told everything was okay. She later found that 60% of her kidney was removed and that she had renal cancer. She's very upset because of the conflicting information. She  also developed severe anemia and probably will have to on iron infusion. She now doesn't trust the doctors because she doesn't know what to believe. She is scheduled to see an oncologist to make sure she doesn't need any more treatment for cancer. Her nephrologist so has released her and states she needs to come back in 6 months.  The patient travels 3 hours to her job and stays there in an apartment. She's been doing this since June. Now however she doesn't feel able to function. She's been very depressed since the surgery and the news about the cancer. She's been crying all the time. Her thoughts are racing and she is unable to sleep. She's very tired and doesn't have any appetite. She's had passive suicidal ideation but no specific plan. She doesn't want to be around people because she gets so upset.  The patient had a depressive episode in May. At that time her sisters were upsetting her. Her mother died in 07-10-11 and left her property to the patient and her 2 sisters. 2 sisters are not good about paying bills and the light bill is in her name. Her primary doctor had put her on  Vibryd which helped but her insurance did not cover it so she is no longer taking it. She's never been on any other antidepressants or had previous psychiatric treatment  The patient returns after 2 months. She  is continuing to lose weight from her gastric sleeve procedure done in April. She is down about 70 pounds now and weighs 229. She is discouraged because her body feels saggy and untoned. She is going to water aerobics but can't do much else because of recent foot surgery. I suggested she do yoga or Pilates for toning. She has 2 or 3 days a week where she feels depressed unmotivated and stays in bed all day. She denies being suicidal or having hallucinations or nightmares. I told her we could try increasing her Wellbutrin. She still feels very uncomfortable in large groups are crowds and initially does not feel like she could  return to her work Review of Systems  Constitutional: Positive for appetite change and fatigue.  Psychiatric/Behavioral: Positive for depression, suicidal ideas, sleep disturbance, dysphoric mood and decreased concentration. The patient is nervous/anxious.    Physical Exam not done Depressive Symptoms: depressed mood, anhedonia, insomnia, psychomotor retardation, fatigue, feelings of worthlessness/guilt, hopelessness, suicidal thoughts without plan, anxiety,  (Hypo) Manic Symptoms:   Elevated Mood:  No Irritable Mood:  No Grandiosity:  No Distractibility:  No Labiality of Mood:  No Delusions:  No Hallucinations:  No Impulsivity:  No Sexually Inappropriate Behavior:  No Financial Extravagance:  No Flight of Ideas:  No  Anxiety Symptoms: Excessive Worry:  Yes Panic Symptoms:  Yes Agoraphobia:  Yes Obsessive Compulsive: No  Symptoms: None, Specific Phobias:  No Social Anxiety:  Yes  Psychotic Symptoms:  Hallucinations: No None Delusions:  No Paranoia:  No   Ideas of Reference:  No  PTSD Symptoms: Ever had a traumatic exposure:  No Had a traumatic exposure in the last month:  No Re-experiencing: No None Hypervigilance:  No Hyperarousal: No None Avoidance: No None  Traumatic Brain Injury: No   Past Psychiatric History: Diagnosis: Maj. depression   Hospitalizations: None   Outpatient Care: None   Substance Abuse Care: None   Self-Mutilation: None   Suicidal Attempts: None   Violent Behaviors: None    Past Medical History:   Past Medical History  Diagnosis Date  . Hypertension   . Diabetes mellitus without complication   . Heart murmur   . Depression   . Anxiety   . GERD (gastroesophageal reflux disease)   . Arthritis   . Gout   . Renal disease 10/2012  . Renal insufficiency   . Renal cancer    History of Loss of Consciousness:  No Seizure History:  No Cardiac History:  No Allergies:   Allergies  Allergen Reactions  . Skelaxin [Metaxalone]  Hives and Rash   Current Medications:  Current Outpatient Prescriptions  Medication Sig Dispense Refill  . aspirin EC 81 MG tablet Take 1 tablet (81 mg total) by mouth daily.    . carbamazepine (TEGRETOL) 200 MG tablet Take 2 tablets (400 mg total) by mouth daily. 60 tablet 2  . diazepam (VALIUM) 10 MG tablet Take 1 tablet (10 mg total) by mouth 4 (four) times daily. 120 tablet 2  . FERREX 150 150 MG capsule     . labetalol (NORMODYNE) 200 MG tablet Take 2 tablets (400 mg total) by mouth 2 (two) times daily. 270 tablet 6  . omeprazole (PRILOSEC) 20 MG capsule Take 20 mg by mouth daily.    . prazosin (MINIPRESS) 5 MG capsule Take 1 capsule (5 mg total) by mouth at bedtime. 30 capsule 2  . QUEtiapine (SEROQUEL) 50 MG tablet Take 1 tablet (50 mg total) by mouth at bedtime. 30 tablet  2  . buPROPion (WELLBUTRIN XL) 300 MG 24 hr tablet Take 1 tablet (300 mg total) by mouth every morning. 30 tablet 2  . losartan (COZAAR) 50 MG tablet Take 75 mg by mouth daily.      No current facility-administered medications for this visit.    Previous Psychotropic Medications:  Medication Dose   Xanax   0.5 mg 3 times a day   Vibryd  40 mg every morning                   Substance Abuse History in the last 12 months: Substance Age of 1st Use Last Use Amount Specific Type  Nicotine      Alcohol      Cannabis      Opiates      Cocaine      Methamphetamines      LSD      Ecstasy      Benzodiazepines      Caffeine      Inhalants      Others:                          Medical Consequences of Substance Abuse:n/a  Legal Consequences of Substance Abuse: n/a  Family Consequences of Substance Abuse: n/a  Blackouts:  No DT's:  No Withdrawal Symptoms:  No None  Social History: Current Place of Residence: [place] of Birth: [place] Family Members: Husband 2 children, one nephew 4 grandchildren Marital Status:  Married Children:   Sons: 1  Daughters:  1 Relationships:  Education:  Dentist Problems/Performance:  Religious Beliefs/Practices: Christian History of Abuse: none Occupational Experiences; 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 Legal History: None Hobbies/Interests: Traveling, visiting with grandchildren  Family History:   Family History  Problem Relation Age of Onset  . Heart attack Mother   . Heart attack Father   . Depression Paternal Aunt   . Alcohol abuse Maternal Uncle   . Colon cancer Maternal Aunt   . Colon cancer Maternal Uncle     Mental Status Examination/Evaluation: Objective:  Appearance: Casual and Fairly Groomed appears much thinner   Eye Contact::  Fair  Speech:  Clear and Coherent  Volume:  Normal  Mood: Depressed   Affect:  Constricted   Thought Process:  Intact  Orientation:  Full (Time, Place, and Person)  Thought Content:  WDL  Suicidal Thoughts:no  Homicidal Thoughts:no  Judgement:  Intact  Insight:  Good  Psychomotor Activity:  Normal  Akathisia:  No  Handed:  Right  AIMS (if indicated):    Assets:  Communication Skills Desire for Improvement    Laboratory/X-Ray Psychological Evaluation(s)       Assessment:  Axis I: Generalized Anxiety Disorder and Major Depression, Recurrent severe  AXIS I Generalized Anxiety Disorder and Major Depression, Recurrent severe  AXIS II Deferred  AXIS III Past Medical History  Diagnosis Date  . Hypertension   . Diabetes mellitus without complication   . Heart murmur   . Depression   . Anxiety   . GERD (gastroesophageal reflux disease)   . Arthritis   . Gout   . Renal disease 10/2012  . Renal insufficiency   . Renal cancer      AXIS IV other psychosocial or environmental problems  AXIS V 51-60 moderate symptoms   Treatment Plan/Recommendations:  Plan of Care: Medication management   Laboratory:   Psychotherapy: The patient is  seeing Maurice Small here and will reschedule with her    Medications: She'll continue Wellbutrin for depression but increase the dose to 300 mg XL daily, continue, Tegretol and Seroquel for mood stabilization and Valium for anxiety and prazosin 5 mg at bedtime for nightmares   Routine PRN Medications:  No  Consultations:   Safety Concerns:  She contracts for safety   Other:    return in  6 weeks     [name], MD 9/1/20161:45 PM

## 2014-12-14 ENCOUNTER — Encounter: Payer: Self-pay | Admitting: Cardiology

## 2014-12-14 ENCOUNTER — Ambulatory Visit (INDEPENDENT_AMBULATORY_CARE_PROVIDER_SITE_OTHER): Payer: BLUE CROSS/BLUE SHIELD | Admitting: Cardiology

## 2014-12-14 VITALS — BP 151/90 | HR 55 | Ht 71.0 in | Wt 228.0 lb

## 2014-12-14 DIAGNOSIS — I10 Essential (primary) hypertension: Secondary | ICD-10-CM

## 2014-12-14 MED ORDER — AMLODIPINE BESYLATE 5 MG PO TABS: 5.0000 mg | ORAL_TABLET | Freq: Every day | ORAL | Status: DC

## 2014-12-14 NOTE — Progress Notes (Signed)
Patient ID: SAMATHA ANSPACH, female   DOB: 1960-08-04, 54 y.o.   MRN: [id number]     Clinical Summary Ms. Danek is a 54 y.o.female seen today for follow up of the following medical problems.   1. HTN  - reports diagnosed with HTN age 2. Historically has been difficult to control - CT scan in 2006 with normal renal arteries. TSH 1.80. No EtoH. Normal renin and aldo ratio. Normal sleep study 10/2013 at [facility].  - since recent gastric sleeve surgery she has had significant weight loss, bp's have trended down and she has been taken off some of her bp meds since I last saw her.  - checks at home daily. Typically around 140s/70s,    2. Obesity - had gastric sleeve procedure done  3. CKD - followed Dr Kris Mouton at [facility].  - July 2016 Cr 2.62, BUN 40 Past Medical History  Diagnosis Date  . Hypertension   . Diabetes mellitus without complication   . Heart murmur   . Depression   . Anxiety   . GERD (gastroesophageal reflux disease)   . Arthritis   . Gout   . Renal disease 10/2012  . Renal insufficiency   . Renal cancer      Allergies  Allergen Reactions  . Skelaxin [Metaxalone] Hives and Rash     Current Outpatient Prescriptions  Medication Sig Dispense Refill  . aspirin EC 81 MG tablet Take 1 tablet (81 mg total) by mouth daily.    Marland Kitchen buPROPion (WELLBUTRIN XL) 300 MG 24 hr tablet Take 1 tablet (300 mg total) by mouth every morning. 30 tablet 2  . carbamazepine (TEGRETOL) 200 MG tablet Take 2 tablets (400 mg total) by mouth daily. 60 tablet 2  . diazepam (VALIUM) 10 MG tablet Take 1 tablet (10 mg total) by mouth 4 (four) times daily. 120 tablet 2  . FERREX 150 150 MG capsule     . labetalol (NORMODYNE) 200 MG tablet Take 2 tablets (400 mg total) by mouth 2 (two) times daily. 270 tablet 6  . losartan (COZAAR) 50 MG tablet Take 75 mg by mouth daily.     Marland Kitchen omeprazole (PRILOSEC) 20 MG capsule Take 20 mg by mouth daily.    . prazosin (MINIPRESS) 5 MG capsule Take 1 capsule (5 mg  total) by mouth at bedtime. 30 capsule 2  . QUEtiapine (SEROQUEL) 50 MG tablet Take 1 tablet (50 mg total) by mouth at bedtime. 30 tablet 2   No current facility-administered medications for this visit.     Past Surgical History  Procedure Laterality Date  . Cesarean section  [id number]  . Knee arthroscopy with medial menisectomy Right 08/22/2012    Procedure: KNEE ARTHROSCOPY WITH PARTIAL MEDIAL MENISECTOMY;  Surgeon: Carole Civil, MD;  Location: [facility];  Service: Orthopedics;  Laterality: Right;  . Chondroplasty Right 08/22/2012    Procedure: CHONDROPLASTY;  Surgeon: Carole Civil, MD;  Location: [facility];  Service: Orthopedics;  Laterality: Right;  . Partial nephrectomy  Dec 2014    left  . Colonoscopy with propofol N/A 03/16/2014    Procedure: COLONOSCOPY WITH PROPOFOL (at cecum 1023, total withdrawal time=9 minutes);  Surgeon: Danie Binder, MD;  Location: [facility];  Service: Endoscopy;  Laterality: N/A;  . Tendon repair    . Gastric bypass       Allergies  Allergen Reactions  . Skelaxin [Metaxalone] Hives and Rash      Family History  Problem Relation Age of Onset  .  Heart attack Mother   . Heart attack Father   . Depression Paternal Aunt   . Alcohol abuse Maternal Uncle   . Colon cancer Maternal Aunt   . Colon cancer Maternal Uncle      Social History Ms. Wilborn reports that she has never smoked. She has never used smokeless tobacco. Ms. Ratto reports that she does not drink alcohol.   Review of Systems CONSTITUTIONAL: No weight loss, fever, chills, weakness or fatigue.  HEENT: Eyes: No visual loss, blurred vision, double vision or yellow sclerae.No hearing loss, sneezing, congestion, runny nose or sore throat.  SKIN: No rash or itching.  CARDIOVASCULAR: per HPI RESPIRATORY: No shortness of breath, cough or sputum.  GASTROINTESTINAL: No anorexia, nausea, vomiting or diarrhea. No abdominal pain or blood.  GENITOURINARY: No burning on urination, no  polyuria NEUROLOGICAL: No headache, dizziness, syncope, paralysis, ataxia, numbness or tingling in the extremities. No change in bowel or bladder control.  MUSCULOSKELETAL: No muscle, back pain, joint pain or stiffness.  LYMPHATICS: No enlarged nodes. No history of splenectomy.  PSYCHIATRIC: No history of depression or anxiety.  ENDOCRINOLOGIC: No reports of sweating, cold or heat intolerance. No polyuria or polydipsia.  Marland Kitchen   Physical Examination Filed Vitals:   12/14/14 1147  BP: 151/90  Pulse: 55   Filed Vitals:   12/14/14 1147  Height: 5\' 11"  (1.803 m)  Weight: 228 lb (103.42 kg)    Gen: resting comfortably, no acute distress HEENT: no scleral icterus, pupils equal round and reactive, no palptable cervical adenopathy,  CV: RRR, no m/r/g, no JVD Resp: Clear to auscultation bilaterally GI: abdomen is soft, non-tender, non-distended, normal bowel sounds, no hepatosplenomegaly MSK: extremities are warm, no edema.  Skin: warm, no rash Neuro:  no focal deficits Psych: appropriate affect   Diagnostic Studies 09/2013 Carotid US 1-39% bilateral ICA disease  09/2013 Exercise MPI Overall low risk exercise/Lexiscan Cardiolite. Patient had limited exercise capacity achieving maximum work load of 7 METS, limited by shortness of breath and hypertensive response. There were no clearly diagnostic ST segment abnormalities. Occasional to frequent PVCs and ventricular couplets were noted early in exercise. There were no sustained arrhythmias. Perfusion imaging shows probable variable soft tissue attenuation, less likely a minor degree of basal lateral ischemia. LVEF is normal at 61% with upper normal chamber volume and no obvious wall motion abnormalities.    Assessment and Plan  1. Resistant HTN  - difficult to control HTN, have not found evidence of secondary HTN - CT 2006 without renal artery stenosis, normal TSH, no EtoH. Normal renin to aldo ratio. Recent negative sleep study -  bp's trended down since significant weight loss with her gastric sleeve surgery, some of her meds have been stopped since our last visit - bp today is elevated, given her CKD goal <130/80 - will start norvasc 5mg  daily and follow bp's. She thinks she may have had some LE edema on norvasc before. We will monitor, given her CKD and low heart rates norvasc would be easiest for her to be on. If does not tolerate would consider hydrlazine.   - she will keep bp log x 2 weeks and submit - F/u 6 months      Arnoldo Lenis, M.D.

## 2014-12-14 NOTE — Patient Instructions (Signed)
Your physician wants you to follow-up in: [place] DR. BRANCH You will receive a reminder letter in the mail two months in advance. If you don't receive a letter, please call our office to schedule the follow-up appointment.  Your physician has recommended you make the following change in your medication:   START AMLODIPINE 5 MG DAILY  Your physician has requested that you regularly monitor and record your blood pressure readings at home FOR 2 [place]. Please use the same machine at the same time of day to check your readings and record them to bring to your follow-up visit.  Thank you for choosing [facility]!!

## 2014-12-22 ENCOUNTER — Ambulatory Visit (INDEPENDENT_AMBULATORY_CARE_PROVIDER_SITE_OTHER): Payer: BLUE CROSS/BLUE SHIELD | Admitting: Psychiatry

## 2014-12-22 ENCOUNTER — Encounter (HOSPITAL_COMMUNITY): Payer: Self-pay | Admitting: Psychiatry

## 2014-12-22 VITALS — BP 190/100 | HR 56 | Ht 71.0 in | Wt 224.0 lb

## 2014-12-22 DIAGNOSIS — F411 Generalized anxiety disorder: Secondary | ICD-10-CM

## 2014-12-22 DIAGNOSIS — F332 Major depressive disorder, recurrent severe without psychotic features: Secondary | ICD-10-CM | POA: Diagnosis not present

## 2014-12-22 DIAGNOSIS — F322 Major depressive disorder, single episode, severe without psychotic features: Secondary | ICD-10-CM

## 2014-12-22 NOTE — Progress Notes (Signed)
Patient ID: April Ward, female   DOB: 02/16/1961, 54 y.o.   MRN: [id number] Patient ID: LEEYA Ward, female   DOB: 1960/11/21, 54 y.o.   MRN: [id number] Patient ID: April Ward, female   DOB: 16-Aug-1960, 54 y.o.   MRN: [id number] Patient ID: April Ward, female   DOB: 1960-12-09, 54 y.o.   MRN: [id number] Patient ID: April Ward, female   DOB: Jul 28, 1960, 54 y.o.   MRN: [id number] Patient ID: April Ward, female   DOB: [birth year], 54 y.o.   MRN: [id number] Patient ID: April Ward, female   DOB: Jul 28, 1960, 54 y.o.   MRN: [id number] Patient ID: April Ward, female   DOB: 05-21-1960, 54 y.o.   MRN: [id number] Patient ID: April Ward, female   DOB: [birth year], 54 y.o.   MRN: [id number] Patient ID: April Ward, female   DOB: 1960/04/21, 54 y.o.   MRN: [id number] Patient ID: April Ward, female   DOB: 02-10-1961, 54 y.o.   MRN: [id number]  Psychiatric Assessment Adult  Patient Identification:  April Ward Date of Evaluation:  12/22/2014 Chief Complaint: "I'm doing better " History of Chief Complaint:   Chief Complaint  Patient presents with  . Depression  . Anxiety  . Follow-up    Depression        Associated symptoms include decreased concentration, fatigue, appetite change and suicidal ideas.  Past medical history includes anxiety.   Anxiety Symptoms include decreased concentration, nervous/anxious behavior and suicidal ideas.     this patient is a 54 year old married black female who lives with her husband, and 95 year old son and 69 year-old nephew  In [place]. 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 The patient states that she had a cyst removed in her left kidney in December. After she had the surgery initially she was told everything was okay. She later found that 60% of her kidney was removed and that she had  renal cancer. She's very upset because of the conflicting information. She also developed severe anemia and probably will have to on iron infusion. She now doesn't trust the doctors because she doesn't know what to believe. She is scheduled to see an oncologist to make sure she doesn't need any more treatment for cancer. Her nephrologist so has released her and states she needs to come back in 6 months.  The patient travels 3 hours to her job and stays there in an apartment. She's been doing this since June. Now however she doesn't feel able to function. She's been very depressed since the surgery and the news about the cancer. She's been crying all the time. Her thoughts are racing and she is unable to sleep. She's very tired and doesn't have any appetite. She's had passive suicidal ideation but no specific plan. She doesn't want to be around people because she gets so upset.  The patient had a depressive episode in May. At that time her sisters were upsetting her. Her mother died in July 08, 2011 and left her property to the patient and her 2 sisters. 2 sisters are not good about paying bills and the light bill is in her name. Her primary doctor had put her on  Vibryd which helped but her insurance did not cover it so she is no longer taking it. She's never been on  any other antidepressants or had previous psychiatric treatment  The patient returns after 3 weeks. She continues to do fairly well. Last time we increased her Wellbutrin and she seems to be tolerating it well and is helping her energy. She's now going to the gym daily and walking as well as doing water aerobics and light weights. She is trying to tone up given her dramatic weight loss from the gastric sleeve procedure. She denies suicidal ideation or auditory or visual hallucinations Review of Systems  Constitutional: Positive for appetite change and fatigue.  Psychiatric/Behavioral: Positive for depression, suicidal ideas, sleep disturbance, dysphoric  mood and decreased concentration. The patient is nervous/anxious.    Physical Exam not done Depressive Symptoms: depressed mood, anhedonia, insomnia, psychomotor retardation, fatigue, feelings of worthlessness/guilt, hopelessness, suicidal thoughts without plan, anxiety,  (Hypo) Manic Symptoms:   Elevated Mood:  No Irritable Mood:  No Grandiosity:  No Distractibility:  No Labiality of Mood:  No Delusions:  No Hallucinations:  No Impulsivity:  No Sexually Inappropriate Behavior:  No Financial Extravagance:  No Flight of Ideas:  No  Anxiety Symptoms: Excessive Worry:  Yes Panic Symptoms:  Yes Agoraphobia:  Yes Obsessive Compulsive: No  Symptoms: None, Specific Phobias:  No Social Anxiety:  Yes  Psychotic Symptoms:  Hallucinations: No None Delusions:  No Paranoia:  No   Ideas of Reference:  No  PTSD Symptoms: Ever had a traumatic exposure:  No Had a traumatic exposure in the last month:  No Re-experiencing: No None Hypervigilance:  No Hyperarousal: No None Avoidance: No None  Traumatic Brain Injury: No   Past Psychiatric History: Diagnosis: Maj. depression   Hospitalizations: None   Outpatient Care: None   Substance Abuse Care: None   Self-Mutilation: None   Suicidal Attempts: None   Violent Behaviors: None    Past Medical History:   Past Medical History  Diagnosis Date  . Hypertension   . Diabetes mellitus without complication   . Heart murmur   . Depression   . Anxiety   . GERD (gastroesophageal reflux disease)   . Arthritis   . Gout   . Renal disease 10/2012  . Renal insufficiency   . Renal cancer    History of Loss of Consciousness:  No Seizure History:  No Cardiac History:  No Allergies:   Allergies  Allergen Reactions  . Skelaxin [Metaxalone] Hives and Rash   Current Medications:  Current Outpatient Prescriptions  Medication Sig Dispense Refill  . amLODipine (NORVASC) 5 MG tablet Take 1 tablet (5 mg total) by mouth daily. 90  tablet 3  . aspirin EC 81 MG tablet Take 1 tablet (81 mg total) by mouth daily.    Marland Kitchen buPROPion (WELLBUTRIN XL) 300 MG 24 hr tablet Take 1 tablet (300 mg total) by mouth every morning. 30 tablet 2  . calcium citrate-vitamin D 500-400 MG-UNIT chewable tablet Chew 1 tablet by mouth 3 (three) times daily.    . carbamazepine (TEGRETOL) 200 MG tablet Take 2 tablets (400 mg total) by mouth daily. 60 tablet 2  . carvedilol (COREG) 6.25 MG tablet Take 6.25 mg by mouth 2 (two) times daily with a meal.    . diazepam (VALIUM) 10 MG tablet Take 1 tablet (10 mg total) by mouth 4 (four) times daily. 120 tablet 2  . FERREX 150 150 MG capsule     . losartan (COZAAR) 50 MG tablet Take 75 mg by mouth daily.     . Multiple Vitamin (MULTIVITAMIN) tablet Take 1 tablet by mouth  2 (two) times daily.    Marland Kitchen omeprazole (PRILOSEC) 20 MG capsule Take 20 mg by mouth daily.    . prazosin (MINIPRESS) 5 MG capsule Take 1 capsule (5 mg total) by mouth at bedtime. 30 capsule 2  . QUEtiapine (SEROQUEL) 50 MG tablet Take 1 tablet (50 mg total) by mouth at bedtime. 30 tablet 2  . vitamin B-12 (CYANOCOBALAMIN) 500 MCG tablet Take 500 mcg by mouth daily.     No current facility-administered medications for this visit.    Previous Psychotropic Medications:  Medication Dose   Xanax   0.5 mg 3 times a day   Vibryd  40 mg every morning                   Substance Abuse History in the last 12 months: Substance Age of 1st Use Last Use Amount Specific Type  Nicotine      Alcohol      Cannabis      Opiates      Cocaine      Methamphetamines      LSD      Ecstasy      Benzodiazepines      Caffeine      Inhalants      Others:                          Medical Consequences of Substance Abuse:n/a  Legal Consequences of Substance Abuse: n/a  Family Consequences of Substance Abuse: n/a  Blackouts:  No DT's:  No Withdrawal Symptoms:  No None  Social History: Current Place of Residence: [place]  Birth: [place] Family Members: Husband 2 children, one nephew 4 grandchildren Marital Status:  Married Children:   Sons: 1  Daughters: 1 Relationships:  Education:  Dentist Problems/Performance:  Religious Beliefs/Practices: Christian History of Abuse: none Occupational Experiences; 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 Legal History: None Hobbies/Interests: Traveling, visiting with grandchildren  Family History:   Family History  Problem Relation Age of Onset  . Heart attack Mother   . Heart attack Father   . Depression Paternal Aunt   . Alcohol abuse Maternal Uncle   . Colon cancer Maternal Aunt   . Colon cancer Maternal Uncle     Mental Status Examination/Evaluation: Objective:  Appearance: Casual and Fairly Groomed appears much thinner   Eye Contact::  Fair  Speech:  Clear and Coherent  Volume:  Normal  Mood: Depressed   Affect:  Constricted   Thought Process:  Intact  Orientation:  Full (Time, Place, and Person)  Thought Content:  WDL  Suicidal Thoughts:no  Homicidal Thoughts:no  Judgement:  Intact  Insight:  Good  Psychomotor Activity:  Normal  Akathisia:  No  Handed:  Right  AIMS (if indicated):    Assets:  Communication Skills Desire for Improvement    Laboratory/X-Ray Psychological Evaluation(s)       Assessment:  Axis I: Generalized Anxiety Disorder and Major Depression, Recurrent severe  AXIS I Generalized Anxiety Disorder and Major Depression, Recurrent severe  AXIS II Deferred  AXIS III Past Medical History  Diagnosis Date  . Hypertension   . Diabetes mellitus without complication   . Heart murmur   . Depression   . Anxiety   . GERD (gastroesophageal reflux disease)   . Arthritis   . Gout   . Renal disease 10/2012  . Renal insufficiency   . Renal cancer  AXIS IV other psychosocial or environmental problems  AXIS V 51-60 moderate symptoms   Treatment  Plan/Recommendations:  Plan of Care: Medication management   Laboratory:   Psychotherapy: The patient is seeing Maurice Small here and will reschedule with her   Medications: She'll continue Wellbutrin for depression  300 mg XL daily, continue, Tegretol and Seroquel for mood stabilization and Valium for anxiety and prazosin 5 mg at bedtime for nightmares   Routine PRN Medications:  No  Consultations:   Safety Concerns:  She contracts for safety   Other:    return in  6 weeks     [name], MD 9/21/20161:28 PM

## 2014-12-31 ENCOUNTER — Telehealth (HOSPITAL_COMMUNITY): Payer: Self-pay | Admitting: *Deleted

## 2014-12-31 NOTE — Telephone Encounter (Signed)
You may send those in

## 2014-12-31 NOTE — Telephone Encounter (Signed)
Pt pharmacy requesting authorization from providers office to fill pt medications for 90 days supply due to insurance only paying for that amount. Medications pharmacy requesting 90 days supply for are Quetiapine Fumarate 50 mg QHS, Bupropion HCL XL 300 mg QAM, Prazosin 5 mg QHS and Carbamazepine 200 mg 2 Tablets QD. Pt pharmacy is [facility] [place]. Pharmacy number is [phone] and fax number is [phone].

## 2015-01-04 ENCOUNTER — Telehealth (HOSPITAL_COMMUNITY): Payer: Self-pay | Admitting: *Deleted

## 2015-01-04 MED ORDER — QUETIAPINE FUMARATE 50 MG PO TABS: 50.0000 mg | ORAL_TABLET | Freq: Every day | ORAL | Status: DC

## 2015-01-04 MED ORDER — PRAZOSIN HCL 5 MG PO CAPS: 5.0000 mg | ORAL_CAPSULE | Freq: Every day | ORAL | Status: DC

## 2015-01-04 MED ORDER — BUPROPION HCL ER (XL) 300 MG PO TB24: 300.0000 mg | ORAL_TABLET | ORAL | Status: DC

## 2015-01-04 MED ORDER — CARBAMAZEPINE 200 MG PO TABS: 400.0000 mg | ORAL_TABLET | Freq: Every day | ORAL | Status: DC

## 2015-01-04 NOTE — Telephone Encounter (Signed)
Per Dr.Ross to send in pt medication to pharmacy due to pharmacy request.

## 2015-01-04 NOTE — Telephone Encounter (Signed)
Medication sent to pharmacy  

## 2015-01-05 ENCOUNTER — Encounter: Payer: Self-pay | Admitting: *Deleted

## 2015-01-10 DIAGNOSIS — Z9884 Bariatric surgery status: Secondary | ICD-10-CM | POA: Insufficient documentation

## 2015-01-10 DIAGNOSIS — E46 Unspecified protein-calorie malnutrition: Secondary | ICD-10-CM | POA: Insufficient documentation

## 2015-01-13 ENCOUNTER — Ambulatory Visit (HOSPITAL_COMMUNITY): Payer: Self-pay | Admitting: Psychiatry

## 2015-02-02 ENCOUNTER — Ambulatory Visit (HOSPITAL_COMMUNITY): Payer: Self-pay | Admitting: Psychiatry

## 2015-02-09 ENCOUNTER — Ambulatory Visit (INDEPENDENT_AMBULATORY_CARE_PROVIDER_SITE_OTHER): Payer: Self-pay | Admitting: Psychiatry

## 2015-02-09 ENCOUNTER — Encounter (HOSPITAL_COMMUNITY): Payer: Self-pay | Admitting: Psychiatry

## 2015-02-09 DIAGNOSIS — F411 Generalized anxiety disorder: Secondary | ICD-10-CM

## 2015-02-09 DIAGNOSIS — F322 Major depressive disorder, single episode, severe without psychotic features: Secondary | ICD-10-CM

## 2015-02-09 NOTE — Patient Instructions (Signed)
Discussed orally 

## 2015-02-09 NOTE — Progress Notes (Signed)
           THERAPIST PROGRESS NOTE  Session Time:    Wednesday 02/09/2015 4:28 PM- 5:00 PM  Participation Level: Active  Behavioral Response: Casual/Alert/Anxious  Type of Therapy: Individual Therapy  Treatment Goals addressed: Develop healthy thinking patterns about self, others, and the world. Implement coping skills reducing anxiety, excessive worry, and improving daily functioning  Interventions: CBT and Supportive  Summary: April Ward is a 54 y.o. female who presents with symptoms of depression and anxiety. She states she initially became depressed in May of 2014 when she and her sisters began having issues regarding expenses for the family home. 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 Patient reports lifelong perfectionistic tendencies and says anxiety has worsened stating that she tends to think too much. Patient's symptoms included depressed mood, anxiety, racing thoughts, ruminating thoughts, sleep difficulty (2-3 hours of sleep per night) excessive worrying, crying spells, loss of interest in activities, and loss of appetite.   Patient last was seen in April 2016. She is resuming services today due to increased anxiety. She reports this has been triggered by concerns about her 81-year-old granddaughter being choked by one of her classmates who has behavioral issues and the recent presidential election. pro Patient has become fearful and constantly worries about her welfare and the welfare of friends and family. This also has triggered memories of fear and discrimination patient experienced on her last job per patient's report. Patient also reports stress and sadness regarding change in her relationship with her best  friend after she made a negative comment about patient's weight loss.    Suicidal/Homicidal:No  Therapist Response: Therapist works with patient to facilitate expression of feelings, identify thought process and effects on patient's mood and behavior, identify ways to intervene using coping statements when catastrophizing, review relaxation techniques  Plan: Return again in 2 weeks.   Diagnosis:Axis I: MDD, GAD  Axis II: No diagnosis      Kia Varnadore, LCSW 02/09/2015

## 2015-02-11 ENCOUNTER — Encounter (HOSPITAL_COMMUNITY): Payer: Self-pay | Admitting: Psychiatry

## 2015-02-11 ENCOUNTER — Ambulatory Visit (INDEPENDENT_AMBULATORY_CARE_PROVIDER_SITE_OTHER): Payer: BLUE CROSS/BLUE SHIELD | Admitting: Psychiatry

## 2015-02-11 VITALS — BP 165/73 | HR 53 | Ht 71.0 in | Wt 221.0 lb

## 2015-02-11 DIAGNOSIS — F322 Major depressive disorder, single episode, severe without psychotic features: Secondary | ICD-10-CM

## 2015-02-11 DIAGNOSIS — F411 Generalized anxiety disorder: Secondary | ICD-10-CM

## 2015-02-11 MED ORDER — BUPROPION HCL ER (XL) 300 MG PO TB24: 300.0000 mg | ORAL_TABLET | ORAL | Status: DC

## 2015-02-11 MED ORDER — CARBAMAZEPINE 200 MG PO TABS: 400.0000 mg | ORAL_TABLET | Freq: Every day | ORAL | Status: DC

## 2015-02-11 MED ORDER — QUETIAPINE FUMARATE 50 MG PO TABS: 50.0000 mg | ORAL_TABLET | Freq: Every day | ORAL | Status: DC

## 2015-02-11 MED ORDER — PRAZOSIN HCL 5 MG PO CAPS: 5.0000 mg | ORAL_CAPSULE | Freq: Every day | ORAL | Status: DC

## 2015-02-11 MED ORDER — DIAZEPAM 10 MG PO TABS: 10.0000 mg | ORAL_TABLET | Freq: Four times a day (QID) | ORAL | Status: DC

## 2015-02-11 NOTE — Progress Notes (Signed)
Patient ID: April Ward, female   DOB: April 02, 1961, 54 y.o.   MRN: [id number] Patient ID: April Ward, female   DOB: 10-Sep-1960, 54 y.o.   MRN: [id number] Patient ID: April Ward, female   DOB: Dec 19, 1960, 54 y.o.   MRN: [id number] Patient ID: April Ward, female   DOB: June 07, 1960, 54 y.o.   MRN: [id number] Patient ID: April Ward, female   DOB: November 21, 1960, 54 y.o.   MRN: [id number] Patient ID: April Ward, female   DOB: 04/11/1960, 54 y.o.   MRN: [id number] Patient ID: April Ward, female   DOB: September 19, 1960, 54 y.o.   MRN: [id number] Patient ID: April Ward, female   DOB: [birth year], 54 y.o.   MRN: [id number] Patient ID: April Ward, female   DOB: 02/15/1961, 54 y.o.   MRN: [id number] Patient ID: April Ward, female   DOB: 06-04-1960, 53 y.o.   MRN: [id number] Patient ID: April Ward, female   DOB: May 20, 1960, 54 y.o.   MRN: [id number] Patient ID: April Ward, female   DOB: 04-Jan-1961, 54 y.o.   MRN: [id number]  Psychiatric Assessment Adult  Patient Identification:  April Ward Date of Evaluation:  02/11/2015 Chief Complaint: "I'm doing better " History of Chief Complaint:   Chief Complaint  Patient presents with  . Depression  . Anxiety  . Follow-up    Depression        Associated symptoms include decreased concentration, fatigue, appetite change and suicidal ideas.  Past medical history includes anxiety.   Anxiety Symptoms include decreased concentration, nervous/anxious behavior and suicidal ideas.     this patient is a 54 year old married black female who lives with her husband, and 71 year old son and 12 year-old nephew  In [place]. 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 The patient states that she had a cyst removed in her left kidney in December. After she had the surgery initially she was told everything  was okay. She later found that 60% of her kidney was removed and that she had renal cancer. She's very upset because of the conflicting information. She also developed severe anemia and probably will have to on iron infusion. She now doesn't trust the doctors because she doesn't know what to believe. She is scheduled to see an oncologist to make sure she doesn't need any more treatment for cancer. Her nephrologist so has released her and states she needs to come back in 6 months.  The patient travels 3 hours to her job and stays there in an apartment. She's been doing this since June. Now however she doesn't feel able to function. She's been very depressed since the surgery and the news about the cancer. She's been crying all the time. Her thoughts are racing and she is unable to sleep. She's very tired and doesn't have any appetite. She's had passive suicidal ideation but no specific plan. She doesn't want to be around people because she gets so upset.  The patient had a depressive episode in May. At that time her sisters were upsetting her. Her mother died in 07/07/2011 and left her property to the patient and her 2 sisters. 2 sisters are not good about paying bills and the light bill is in her name. Her primary doctor had put her on  Vibryd which helped but her  insurance did not cover it so she is no longer taking it. She's never been on any other antidepressants or had previous psychiatric treatment  The patient returns after 2 months. She is doing okay in general but is been upset about the recent election and the effect on her family. Being African-American, her grandchildren have been called derogatory names at school. She's focusing a lot of negative events that have happened in the country and feels more frightened and paranoid that she will be persecuted. I urged her to look at positive people and events and try not to keep looking at the news and she agrees. She continues to lose weight exercise and walk  and her mood is generally stable. Her general health is much better Review of Systems  Constitutional: Positive for appetite change and fatigue.  Psychiatric/Behavioral: Positive for depression, suicidal ideas, sleep disturbance, dysphoric mood and decreased concentration. The patient is nervous/anxious.    Physical Exam not done Depressive Symptoms: depressed mood, anhedonia, insomnia, psychomotor retardation, fatigue, feelings of worthlessness/guilt, hopelessness, suicidal thoughts without plan, anxiety,  (Hypo) Manic Symptoms:   Elevated Mood:  No Irritable Mood:  No Grandiosity:  No Distractibility:  No Labiality of Mood:  No Delusions:  No Hallucinations:  No Impulsivity:  No Sexually Inappropriate Behavior:  No Financial Extravagance:  No Flight of Ideas:  No  Anxiety Symptoms: Excessive Worry:  Yes Panic Symptoms:  Yes Agoraphobia:  Yes Obsessive Compulsive: No  Symptoms: None, Specific Phobias:  No Social Anxiety:  Yes  Psychotic Symptoms:  Hallucinations: No None Delusions:  No Paranoia:  No   Ideas of Reference:  No  PTSD Symptoms: Ever had a traumatic exposure:  No Had a traumatic exposure in the last month:  No Re-experiencing: No None Hypervigilance:  No Hyperarousal: No None Avoidance: No None  Traumatic Brain Injury: No   Past Psychiatric History: Diagnosis: Maj. depression   Hospitalizations: None   Outpatient Care: None   Substance Abuse Care: None   Self-Mutilation: None   Suicidal Attempts: None   Violent Behaviors: None    Past Medical History:   Past Medical History  Diagnosis Date  . Hypertension   . Diabetes mellitus without complication ([place])   . Heart murmur   . Depression   . Anxiety   . GERD (gastroesophageal reflux disease)   . Arthritis   . Gout   . Renal disease 10/2012  . Renal insufficiency   . Renal cancer ([place])    History of Loss of Consciousness:  No Seizure History:  No Cardiac History:  No Allergies:    Allergies  Allergen Reactions  . Skelaxin [Metaxalone] Hives and Rash   Current Medications:  Current Outpatient Prescriptions  Medication Sig Dispense Refill  . amLODipine (NORVASC) 5 MG tablet Take 1 tablet (5 mg total) by mouth daily. 90 tablet 3  . aspirin EC 81 MG tablet Take 1 tablet (81 mg total) by mouth daily.    Marland Kitchen buPROPion (WELLBUTRIN XL) 300 MG 24 hr tablet Take 1 tablet (300 mg total) by mouth every morning. 90 tablet 2  . calcium citrate-vitamin D 500-400 MG-UNIT chewable tablet Chew 1 tablet by mouth 3 (three) times daily.    . carbamazepine (TEGRETOL) 200 MG tablet Take 2 tablets (400 mg total) by mouth daily. 180 tablet 2  . carvedilol (COREG) 6.25 MG tablet Take 6.25 mg by mouth 2 (two) times daily with a meal.    . diazepam (VALIUM) 10 MG tablet Take 1 tablet (10 mg  total) by mouth 4 (four) times daily. 120 tablet 2  . losartan (COZAAR) 50 MG tablet Take 75 mg by mouth daily.     . Multiple Vitamin (MULTIVITAMIN) tablet Take 1 tablet by mouth 3 (three) times daily.     Marland Kitchen omeprazole (PRILOSEC) 20 MG capsule Take 20 mg by mouth daily.    . prazosin (MINIPRESS) 5 MG capsule Take 1 capsule (5 mg total) by mouth at bedtime. 90 capsule 2  . QUEtiapine (SEROQUEL) 50 MG tablet Take 1 tablet (50 mg total) by mouth at bedtime. 90 tablet 2  . vitamin B-12 (CYANOCOBALAMIN) 500 MCG tablet Take 500 mcg by mouth daily.    Marland Kitchen FERREX 150 150 MG capsule      No current facility-administered medications for this visit.    Previous Psychotropic Medications:  Medication Dose   Xanax   0.5 mg 3 times a day   Vibryd  40 mg every morning                   Substance Abuse History in the last 12 months: Substance Age of 1st Use Last Use Amount Specific Type  Nicotine      Alcohol      Cannabis      Opiates      Cocaine      Methamphetamines      LSD      Ecstasy      Benzodiazepines      Caffeine      Inhalants      Others:                          Medical  Consequences of Substance Abuse:n/a  Legal Consequences of Substance Abuse: n/a  Family Consequences of Substance Abuse: n/a  Blackouts:  No DT's:  No Withdrawal Symptoms:  No None  Social History: Current Place of Residence: [place] of Birth: [place] Family Members: Husband 2 children, one nephew 4 grandchildren Marital Status:  Married Children:   Sons: 1  Daughters: 1 Relationships:  Education:  Dentist Problems/Performance:  Religious Beliefs/Practices: Christian History of Abuse: none Occupational Experiences; 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 Legal History: None Hobbies/Interests: Traveling, visiting with grandchildren  Family History:   Family History  Problem Relation Age of Onset  . Heart attack Mother   . Heart attack Father   . Depression Paternal Aunt   . Alcohol abuse Maternal Uncle   . Colon cancer Maternal Aunt   . Colon cancer Maternal Uncle     Mental Status Examination/Evaluation: Objective:  Appearance: Casual and Fairly Groomed appears much thinner   Eye Contact::  Fair  Speech:  Clear and Coherent  Volume:  Normal  Mood: Anxious   Affect:  Constricted   Thought Process:  Intact  Orientation:  Full (Time, Place, and Person)  Thought Content:  WDL  Suicidal Thoughts:no  Homicidal Thoughts:no  Judgement:  Intact  Insight:  Good  Psychomotor Activity:  Normal  Akathisia:  No  Handed:  Right  AIMS (if indicated):    Assets:  Communication Skills Desire for Improvement    Laboratory/X-Ray Psychological Evaluation(s)       Assessment:  Axis I: Generalized Anxiety Disorder and Major Depression, Recurrent severe  AXIS I Generalized Anxiety Disorder and Major Depression, Recurrent severe  AXIS II Deferred  AXIS III Past Medical History  Diagnosis Date  . Hypertension   .  Diabetes mellitus without complication ([place])   . Heart murmur   . Depression   .  Anxiety   . GERD (gastroesophageal reflux disease)   . Arthritis   . Gout   . Renal disease 10/2012  . Renal insufficiency   . Renal cancer ([place])      AXIS IV other psychosocial or environmental problems  AXIS V 51-60 moderate symptoms   Treatment Plan/Recommendations:  Plan of Care: Medication management   Laboratory:   Psychotherapy: The patient is seeing Maurice Small here and will reschedule with her   Medications: She'll continue Wellbutrin  300 mg XL daily, continue Tegretol and Seroquel for mood stabilization and Valium for anxiety and prazosin 5 mg at bedtime for nightmares   Routine PRN Medications:  No  Consultations:   Safety Concerns:  She contracts for safety   Other:    return in  2 months     [name], MD 11/11/20161:31 PM

## 2015-03-15 ENCOUNTER — Ambulatory Visit (HOSPITAL_COMMUNITY): Payer: Self-pay | Admitting: Psychiatry

## 2015-04-07 ENCOUNTER — Ambulatory Visit (INDEPENDENT_AMBULATORY_CARE_PROVIDER_SITE_OTHER): Payer: BLUE CROSS/BLUE SHIELD | Admitting: Psychiatry

## 2015-04-07 ENCOUNTER — Encounter (HOSPITAL_COMMUNITY): Payer: Self-pay | Admitting: Psychiatry

## 2015-04-07 VITALS — BP 176/81 | HR 50 | Ht 71.0 in | Wt 226.8 lb

## 2015-04-07 DIAGNOSIS — F411 Generalized anxiety disorder: Secondary | ICD-10-CM

## 2015-04-07 DIAGNOSIS — F322 Major depressive disorder, single episode, severe without psychotic features: Secondary | ICD-10-CM | POA: Diagnosis not present

## 2015-04-07 MED ORDER — CARBAMAZEPINE 200 MG PO TABS: 400.0000 mg | ORAL_TABLET | Freq: Every day | ORAL | Status: DC

## 2015-04-07 MED ORDER — QUETIAPINE FUMARATE 50 MG PO TABS: 50.0000 mg | ORAL_TABLET | Freq: Every day | ORAL | Status: DC

## 2015-04-07 MED ORDER — DIAZEPAM 10 MG PO TABS: 10.0000 mg | ORAL_TABLET | Freq: Four times a day (QID) | ORAL | Status: DC

## 2015-04-07 MED ORDER — PRAZOSIN HCL 5 MG PO CAPS: 5.0000 mg | ORAL_CAPSULE | Freq: Every day | ORAL | Status: DC

## 2015-04-07 MED ORDER — BUPROPION HCL ER (XL) 300 MG PO TB24: 300.0000 mg | ORAL_TABLET | ORAL | Status: DC

## 2015-04-07 NOTE — Progress Notes (Signed)
Patient ID: April Ward, female   DOB: 10-12-1960, 55 y.o.   MRN: [id number] Patient ID: April Ward, female   DOB: April 06, 1960, 55 y.o.   MRN: [id number] Patient ID: April Ward, female   DOB: 13-Apr-1960, 55 y.o.   MRN: [id number] Patient ID: April Ward, female   DOB: 11/18/1960, 55 y.o.   MRN: [id number] Patient ID: April Ward, female   DOB: 23-Oct-1960, 55 y.o.   MRN: [id number] Patient ID: April Ward, female   DOB: 08-Feb-1961, 55 y.o.   MRN: [id number] Patient ID: April Ward, female   DOB: 1960/04/05, 55 y.o.   MRN: [id number] Patient ID: April Ward, female   DOB: [birth year], 55 y.o.   MRN: [id number] Patient ID: April Ward, female   DOB: [birth year], 55 y.o.   MRN: [id number] Patient ID: April Ward, female   DOB: 1961-02-09, 55 y.o.   MRN: [id number] Patient ID: April Ward, female   DOB: [birth year], 55 y.o.   MRN: [id number] Patient ID: April Ward, female   DOB: 1960-04-08, 55 y.o.   MRN: [id number] Patient ID: April Ward, female   DOB: [birth year], 55 y.o.   MRN: [id number]  Psychiatric Assessment Adult  Patient Identification:  April Ward Date of Evaluation:  04/07/2015 Chief Complaint: "I'm doing better " History of Chief Complaint:   Chief Complaint  Patient presents with  . Depression  . Anxiety  . Follow-up    Depression        Associated symptoms include decreased concentration, fatigue, appetite change and suicidal ideas.  Past medical history includes anxiety.   Anxiety Symptoms include decreased concentration, nervous/anxious behavior and suicidal ideas.     this patient is a 55 year old married black female who lives with her husband, and 85 year old son and 80 year-old nephew  In [place]. 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 The patient states that she had a cyst removed in her left  kidney in December. After she had the surgery initially she was told everything was okay. She later found that 60% of her kidney was removed and that she had renal cancer. She's very upset because of the conflicting information. She also developed severe anemia and probably will have to on iron infusion. She now doesn't trust the doctors because she doesn't know what to believe. She is scheduled to see an oncologist to make sure she doesn't need any more treatment for cancer. Her nephrologist so has released her and states she needs to come back in 6 months.  The patient travels 3 hours to her job and stays there in an apartment. She's been doing this since June. Now however she doesn't feel able to function. She's been very depressed since the surgery and the news about the cancer. She's been crying all the time. Her thoughts are racing and she is unable to sleep. She's very tired and doesn't have any appetite. She's had passive suicidal ideation but no specific plan. She doesn't want to be around people because she gets so upset.  The patient had a depressive episode in May. At that time her sisters were upsetting her. Her mother died in Aug 02, 2011 and left her property to the patient and her 2 sisters. 2 sisters are not good about paying bills and the light bill is  in her name. Her primary doctor had put her on  Vibryd which helped but her insurance did not cover it so she is no longer taking it. She's never been on any other antidepressants or had previous psychiatric treatment  The patient returns after 2 months. She is doing okay in general. However she was distressed over the holidays because both her cousin and her aunt died fairly suddenly. She is trying to get more active again and start walking and watching her diet. She gained 5 pounds over the holidays which is distressing since she had gone through gastric bypass surgery. The most part she's sleeping well her energy is pretty good and she denies  frequent panic attacks or nightmares Review of Systems  Constitutional: Positive for appetite change and fatigue.  Psychiatric/Behavioral: Positive for depression, suicidal ideas, sleep disturbance, dysphoric mood and decreased concentration. The patient is nervous/anxious.    Physical Exam not done Depressive Symptoms: depressed mood, anhedonia, insomnia, psychomotor retardation, fatigue, feelings of worthlessness/guilt, hopelessness, suicidal thoughts without plan, anxiety,  (Hypo) Manic Symptoms:   Elevated Mood:  No Irritable Mood:  No Grandiosity:  No Distractibility:  No Labiality of Mood:  No Delusions:  No Hallucinations:  No Impulsivity:  No Sexually Inappropriate Behavior:  No Financial Extravagance:  No Flight of Ideas:  No  Anxiety Symptoms: Excessive Worry:  Yes Panic Symptoms:  Yes Agoraphobia:  Yes Obsessive Compulsive: No  Symptoms: None, Specific Phobias:  No Social Anxiety:  Yes  Psychotic Symptoms:  Hallucinations: No None Delusions:  No Paranoia:  No   Ideas of Reference:  No  PTSD Symptoms: Ever had a traumatic exposure:  No Had a traumatic exposure in the last month:  No Re-experiencing: No None Hypervigilance:  No Hyperarousal: No None Avoidance: No None  Traumatic Brain Injury: No   Past Psychiatric History: Diagnosis: Maj. depression   Hospitalizations: None   Outpatient Care: None   Substance Abuse Care: None   Self-Mutilation: None   Suicidal Attempts: None   Violent Behaviors: None    Past Medical History:   Past Medical History  Diagnosis Date  . Hypertension   . Diabetes mellitus without complication ([place])   . Heart murmur   . Depression   . Anxiety   . GERD (gastroesophageal reflux disease)   . Arthritis   . Gout   . Renal disease 10/2012  . Renal insufficiency   . Renal cancer ([place])    History of Loss of Consciousness:  No Seizure History:  No Cardiac History:  No Allergies:   Allergies  Allergen  Reactions  . Skelaxin [Metaxalone] Hives and Rash   Current Medications:  Current Outpatient Prescriptions  Medication Sig Dispense Refill  . amLODipine (NORVASC) 5 MG tablet Take 1 tablet (5 mg total) by mouth daily. 90 tablet 3  . aspirin EC 81 MG tablet Take 1 tablet (81 mg total) by mouth daily.    Marland Kitchen buPROPion (WELLBUTRIN XL) 300 MG 24 hr tablet Take 1 tablet (300 mg total) by mouth every morning. 90 tablet 2  . calcium citrate-vitamin D 500-400 MG-UNIT chewable tablet Chew 1 tablet by mouth 3 (three) times daily.    . carbamazepine (TEGRETOL) 200 MG tablet Take 2 tablets (400 mg total) by mouth daily. 180 tablet 2  . carvedilol (COREG) 6.25 MG tablet Take 6.25 mg by mouth 2 (two) times daily with a meal.    . diazepam (VALIUM) 10 MG tablet Take 1 tablet (10 mg total) by mouth 4 (four) times  daily. 120 tablet 2  . FERREX 150 150 MG capsule     . losartan (COZAAR) 50 MG tablet Take 75 mg by mouth daily.     . Multiple Vitamin (MULTIVITAMIN) tablet Take 1 tablet by mouth 3 (three) times daily.     Marland Kitchen omeprazole (PRILOSEC) 20 MG capsule Take 20 mg by mouth daily.    . prazosin (MINIPRESS) 5 MG capsule Take 1 capsule (5 mg total) by mouth at bedtime. 90 capsule 2  . QUEtiapine (SEROQUEL) 50 MG tablet Take 1 tablet (50 mg total) by mouth at bedtime. 90 tablet 2  . vitamin B-12 (CYANOCOBALAMIN) 500 MCG tablet Take 500 mcg by mouth daily.     No current facility-administered medications for this visit.    Previous Psychotropic Medications:  Medication Dose   Xanax   0.5 mg 3 times a day   Vibryd  40 mg every morning                   Substance Abuse History in the last 12 months: Substance Age of 1st Use Last Use Amount Specific Type  Nicotine      Alcohol      Cannabis      Opiates      Cocaine      Methamphetamines      LSD      Ecstasy      Benzodiazepines      Caffeine      Inhalants      Others:                          Medical Consequences of Substance  Abuse:n/a  Legal Consequences of Substance Abuse: n/a  Family Consequences of Substance Abuse: n/a  Blackouts:  No DT's:  No Withdrawal Symptoms:  No None  Social History: Current Place of Residence: [place] of Birth: [place] Family Members: Husband 2 children, one nephew 4 grandchildren Marital Status:  Married Children:   Sons: 1  Daughters: 1 Relationships:  Education:  Dentist Problems/Performance:  Religious Beliefs/Practices: Christian History of Abuse: none Occupational Experiences; 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 Legal History: None Hobbies/Interests: Traveling, visiting with grandchildren  Family History:   Family History  Problem Relation Age of Onset  . Heart attack Mother   . Heart attack Father   . Depression Paternal Aunt   . Alcohol abuse Maternal Uncle   . Colon cancer Maternal Aunt   . Colon cancer Maternal Uncle     Mental Status Examination/Evaluation: Objective:  Appearance: Casual and Fairly Groomed appears much thinner   Eye Contact::  Fair  Speech:  Clear and Coherent  Volume:  Normal  Mood: Fairly good   Affect:  Constricted   Thought Process:  Intact  Orientation:  Full (Time, Place, and Person)  Thought Content:  WDL  Suicidal Thoughts:no  Homicidal Thoughts:no  Judgement:  Intact  Insight:  Good  Psychomotor Activity:  Normal  Akathisia:  No  Handed:  Right  AIMS (if indicated):    Assets:  Communication Skills Desire for Improvement    Laboratory/X-Ray Psychological Evaluation(s)       Assessment:  Axis I: Generalized Anxiety Disorder and Major Depression, Recurrent severe  AXIS I Generalized Anxiety Disorder and Major Depression, Recurrent severe  AXIS II Deferred  AXIS III Past Medical History  Diagnosis Date  . Hypertension   . Diabetes mellitus without complication (  [facility])   . Heart murmur   . Depression   . Anxiety   . GERD  (gastroesophageal reflux disease)   . Arthritis   . Gout   . Renal disease 10/2012  . Renal insufficiency   . Renal cancer ([place])      AXIS IV other psychosocial or environmental problems  AXIS V 51-60 moderate symptoms   Treatment Plan/Recommendations:  Plan of Care: Medication management   Laboratory:   Psychotherapy: The patient is seeing Maurice Small here and will reschedule with her   Medications: She'll continue Wellbutrin  300 mg XL daily, continue Tegretol and Seroquel for mood stabilization and Valium for anxiety and prazosin 5 mg at bedtime for nightmares   Routine PRN Medications:  No  Consultations:   Safety Concerns:  She contracts for safety   Other:    return in  3 months     [name], MD 1/5/20174:20 PM

## 2015-04-13 ENCOUNTER — Ambulatory Visit (HOSPITAL_COMMUNITY): Payer: Self-pay | Admitting: Psychiatry

## 2015-05-18 ENCOUNTER — Encounter (HOSPITAL_COMMUNITY): Payer: Self-pay

## 2015-06-13 ENCOUNTER — Ambulatory Visit: Payer: Self-pay | Admitting: Cardiology

## 2015-07-01 ENCOUNTER — Encounter: Payer: Self-pay | Admitting: Cardiology

## 2015-07-01 ENCOUNTER — Ambulatory Visit (INDEPENDENT_AMBULATORY_CARE_PROVIDER_SITE_OTHER): Payer: Self-pay | Admitting: Cardiology

## 2015-07-01 VITALS — BP 162/71 | HR 52 | Ht 71.0 in | Wt 220.4 lb

## 2015-07-01 DIAGNOSIS — I10 Essential (primary) hypertension: Secondary | ICD-10-CM

## 2015-07-01 DIAGNOSIS — R42 Dizziness and giddiness: Secondary | ICD-10-CM

## 2015-07-01 MED ORDER — MECLIZINE HCL 25 MG PO TABS: 25.0000 mg | ORAL_TABLET | Freq: Three times a day (TID) | ORAL | Status: DC | PRN

## 2015-07-01 NOTE — Progress Notes (Signed)
Patient ID: April Ward, female   DOB: 1960-05-17, 55 y.o.   MRN: [id number]     Clinical Summary April Ward is a 55 y.o.female seen today for follow up of the following medical problems.   1. HTN  - reports diagnosed with HTN age 19. Historically has been difficult to control - CT scan in 2006 with normal renal arteries. TSH 1.80. No EtoH. Normal renin and aldo ratio. Normal sleep study 10/2013 at [facility].  - since recent gastric sleeve surgery she has had significant weight loss, bp's have trended down and she has been taken off some of her bp meds  - typically 130-140s/70-80s at home   2. Obesity - had gastric sleeve procedure done, significant weight loss since that time.   3. CKD - followed Dr Kris Mouton at [facility].  - July 2016 Cr 2.62, BUN 40  4. Dizziness - can occur with lying, sitting or standing. Can occur with changing positions of her head. Often has sinus congestion, sinus pressure    Past Medical History  Diagnosis Date  . Hypertension   . Diabetes mellitus without complication ([place])   . Heart murmur   . Depression   . Anxiety   . GERD (gastroesophageal reflux disease)   . Arthritis   . Gout   . Renal disease 10/2012  . Renal insufficiency   . Renal cancer ([place])      Allergies  Allergen Reactions  . Skelaxin [Metaxalone] Hives and Rash     Current Outpatient Prescriptions  Medication Sig Dispense Refill  . amLODipine (NORVASC) 5 MG tablet Take 1 tablet (5 mg total) by mouth daily. 90 tablet 3  . aspirin EC 81 MG tablet Take 1 tablet (81 mg total) by mouth daily.    Marland Kitchen buPROPion (WELLBUTRIN XL) 300 MG 24 hr tablet Take 1 tablet (300 mg total) by mouth every morning. 90 tablet 2  . calcium citrate-vitamin D 500-400 MG-UNIT chewable tablet Chew 1 tablet by mouth 3 (three) times daily.    . carbamazepine (TEGRETOL) 200 MG tablet Take 2 tablets (400 mg total) by mouth daily. 180 tablet 2  . carvedilol (COREG) 6.25 MG tablet Take 6.25 mg by mouth 2 (two)  times daily with a meal.    . diazepam (VALIUM) 10 MG tablet Take 1 tablet (10 mg total) by mouth 4 (four) times daily. 120 tablet 2  . FERREX 150 150 MG capsule     . losartan (COZAAR) 50 MG tablet Take 75 mg by mouth daily.     . Multiple Vitamin (MULTIVITAMIN) tablet Take 1 tablet by mouth 3 (three) times daily.     Marland Kitchen omeprazole (PRILOSEC) 20 MG capsule Take 20 mg by mouth daily.    . prazosin (MINIPRESS) 5 MG capsule Take 1 capsule (5 mg total) by mouth at bedtime. 90 capsule 2  . QUEtiapine (SEROQUEL) 50 MG tablet Take 1 tablet (50 mg total) by mouth at bedtime. 90 tablet 2  . vitamin B-12 (CYANOCOBALAMIN) 500 MCG tablet Take 500 mcg by mouth daily.     No current facility-administered medications for this visit.     Past Surgical History  Procedure Laterality Date  . Cesarean section  [id number]  . Knee arthroscopy with medial menisectomy Right 08/22/2012    Procedure: KNEE ARTHROSCOPY WITH PARTIAL MEDIAL MENISECTOMY;  Surgeon: Carole Civil, MD;  Location: [facility];  Service: Orthopedics;  Laterality: Right;  . Chondroplasty Right 08/22/2012    Procedure: CHONDROPLASTY;  Surgeon: Carole Civil, MD;  Location: [facility];  Service: Orthopedics;  Laterality: Right;  . Partial nephrectomy  Dec 2014    left  . Colonoscopy with propofol N/A 03/16/2014    Procedure: COLONOSCOPY WITH PROPOFOL (at cecum 1023, total withdrawal time=9 minutes);  Surgeon: Danie Binder, MD;  Location: [facility];  Service: Endoscopy;  Laterality: N/A;  . Tendon repair    . Gastric bypass       Allergies  Allergen Reactions  . Skelaxin [Metaxalone] Hives and Rash      Family History  Problem Relation Age of Onset  . Heart attack Mother   . Heart attack Father   . Depression Paternal Aunt   . Alcohol abuse Maternal Uncle   . Colon cancer Maternal Aunt   . Colon cancer Maternal Uncle      Social History April Ward reports that she has never smoked. She has never used smokeless tobacco. April Ward  reports that she does not drink alcohol.   Review of Systems CONSTITUTIONAL: No weight loss, fever, chills, weakness or fatigue.  HEENT: Eyes: No visual loss, blurred vision, double vision or yellow sclerae.No hearing loss, sneezing, congestion, runny nose or sore throat.  SKIN: No rash or itching.  CARDIOVASCULAR: no chest pain, no palpiations.  RESPIRATORY: No shortness of breath, cough or sputum.  GASTROINTESTINAL: No anorexia, nausea, vomiting or diarrhea. No abdominal pain or blood.  GENITOURINARY: No burning on urination, no polyuria NEUROLOGICAL: +dizziness MUSCULOSKELETAL: No muscle, back pain, joint pain or stiffness.  LYMPHATICS: No enlarged nodes. No history of splenectomy.  PSYCHIATRIC: No history of depression or anxiety.  ENDOCRINOLOGIC: No reports of sweating, cold or heat intolerance. No polyuria or polydipsia.  Marland Kitchen   Physical Examination Filed Vitals:   07/01/15 1438  BP: 162/71  Pulse: 52   Filed Vitals:   07/01/15 1438  Height: 5\' 11"  (1.803 m)  Weight: 220 lb 6.4 oz (99.973 kg)    Gen: resting comfortably, no acute distress HEENT: no scleral icterus, pupils equal round and reactive, no palptable cervical adenopathy,  CV: RRR, 2/6 systolic murmur RUSB, no jvd Resp: Clear to auscultation bilaterally GI: abdomen is soft, non-tender, non-distended, normal bowel sounds, no hepatosplenomegaly MSK: extremities are warm, no edema.  Skin: warm, no rash Neuro:  no focal deficits Psych: appropriate affect   Diagnostic Studies 09/2013 Carotid US 1-39% bilateral ICA disease  09/2013 Exercise MPI Overall low risk exercise/Lexiscan Cardiolite. Patient had limited exercise capacity achieving maximum work load of 7 METS, limited by shortness of breath and hypertensive response. There were no clearly diagnostic ST segment abnormalities. Occasional to frequent PVCs and ventricular couplets were noted early in exercise. There were no sustained arrhythmias. Perfusion  imaging shows probable variable soft tissue attenuation, less likely a minor degree of basal lateral ischemia. LVEF is normal at 61% with upper normal chamber volume and no obvious wall motion abnormalities.    Assessment and Plan  1. HTN  - negative workup for secondary HTN. BP significantly improved since gastric sleeve procedure and significant weight loss - bp elevated in clinic but home numbers at goal, will conitnue current meds  2. Dizziness - orthostatics negative in clinic. Symptoms suggestive of vertigo, will prescribe prn meclizine.    F/u 1 year   Arnoldo Lenis, M.D

## 2015-07-01 NOTE — Patient Instructions (Signed)
Your physician wants you to follow-up in: [place] DR[address] BRANCH You will receive a reminder letter in the mail two months in advance. If you don't receive a letter, please call our office to schedule the follow-up appointment.  Your physician has recommended you make the following change in your medication:   TAKE MECLIZINE 25 MG EVERY EIGHT HOURS AS NEEDED FOR DIZZINESS  Thank you for choosing [facility]!!

## 2015-07-05 ENCOUNTER — Encounter (HOSPITAL_COMMUNITY): Payer: Self-pay | Admitting: Psychiatry

## 2015-07-05 ENCOUNTER — Ambulatory Visit (INDEPENDENT_AMBULATORY_CARE_PROVIDER_SITE_OTHER): Payer: BLUE CROSS/BLUE SHIELD | Admitting: Psychiatry

## 2015-07-05 VITALS — BP 171/87 | HR 53 | Ht 71.0 in | Wt 216.6 lb

## 2015-07-05 DIAGNOSIS — F411 Generalized anxiety disorder: Secondary | ICD-10-CM | POA: Diagnosis not present

## 2015-07-05 DIAGNOSIS — F322 Major depressive disorder, single episode, severe without psychotic features: Secondary | ICD-10-CM

## 2015-07-05 MED ORDER — CARBAMAZEPINE 200 MG PO TABS: 400.0000 mg | ORAL_TABLET | Freq: Every day | ORAL | Status: DC

## 2015-07-05 MED ORDER — PRAZOSIN HCL 5 MG PO CAPS: 5.0000 mg | ORAL_CAPSULE | Freq: Every day | ORAL | Status: DC

## 2015-07-05 MED ORDER — DIAZEPAM 10 MG PO TABS: 10.0000 mg | ORAL_TABLET | Freq: Four times a day (QID) | ORAL | Status: DC

## 2015-07-05 MED ORDER — BUPROPION HCL ER (XL) 300 MG PO TB24: 300.0000 mg | ORAL_TABLET | ORAL | Status: DC

## 2015-07-05 MED ORDER — QUETIAPINE FUMARATE 50 MG PO TABS: 50.0000 mg | ORAL_TABLET | Freq: Every day | ORAL | Status: DC

## 2015-07-05 NOTE — Progress Notes (Signed)
Patient ID: April Ward, female   DOB: [birth year], 55 y.o.   MRN: [id number] Patient ID: April Ward, female   DOB: 04-Jul-1960, 55 y.o.   MRN: [id number] Patient ID: April Ward, female   DOB: Dec 01, 1960, 55 y.o.   MRN: [id number] Patient ID: April Ward, female   DOB: 1961/01/16, 55 y.o.   MRN: [id number] Patient ID: April Ward, female   DOB: 13-May-1960, 55 y.o.   MRN: [id number] Patient ID: April Ward, female   DOB: 08-09-1960, 55 y.o.   MRN: [id number] Patient ID: April Ward, female   DOB: 09-15-1960, 55 y.o.   MRN: [id number] Patient ID: April Ward, female   DOB: 1961/01/19, 55 y.o.   MRN: [id number] Patient ID: April Ward, female   DOB: 1961/02/08, 55 y.o.   MRN: [id number] Patient ID: April Ward, female   DOB: [birth year], 55 y.o.   MRN: [id number] Patient ID: April Ward, female   DOB: 1960-12-20, 55 y.o.   MRN: [id number] Patient ID: April Ward, female   DOB: [birth year], 55 y.o.   MRN: [id number] Patient ID: April Ward, female   DOB: 1961-02-25, 55 y.o.   MRN: [id number] Patient ID: April Ward, female   DOB: January 23, 1961, 55 y.o.   MRN: [id number]  Psychiatric Assessment Adult  Patient Identification:  April Ward Date of Evaluation:  07/05/2015 Chief Complaint: "I'm doing better " History of Chief Complaint:   Chief Complaint  Patient presents with  . Depression  . Anxiety  . Follow-up    Depression        Associated symptoms include decreased concentration, fatigue, appetite change and suicidal ideas.  Past medical history includes anxiety.   Anxiety Symptoms include decreased concentration, nervous/anxious behavior and suicidal ideas.     this patient is a 55 year old married black female who lives with her husband, and 55 year old son and 55 year-old nephew  In [place]. 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 The patient states that she had a cyst removed in her left kidney in December. After she had the surgery initially she was told everything was okay. She later found that 60% of her kidney was removed and that she had renal cancer. She's very upset because of the conflicting information. She also developed severe anemia and probably will have to on iron infusion. She now doesn't trust the doctors because she doesn't know what to believe. She is scheduled to see an oncologist to make sure she doesn't need any more treatment for cancer. Her nephrologist so has released her and states she needs to come back in 6 months.  The patient travels 3 hours to her job and stays there in an apartment. She's been doing this since June. Now however she doesn't feel able to function. She's been very depressed since the surgery and the news about the cancer. She's been crying all the time. Her thoughts are racing and she is unable to sleep. She's very tired and doesn't have any appetite. She's had passive suicidal ideation but no specific plan. She doesn't want to be around people because she gets so upset.  The patient had a depressive episode in May. At that time her sisters were upsetting her. Her mother died in 07-17-11 and left her property to the patient and  her 2 sisters. 2 sisters are not good about paying bills and the light bill is in her name. Her primary doctor had put her on  Vibryd which helped but her insurance did not cover it so she is no longer taking it. She's never been on any other antidepressants or had previous psychiatric treatment  The patient returns after 3 months. She is doing very well. She has lost a few more pounds and is down to 216 pounds. She still gets irritable with people at times and thinks the combinations of medicines is really helping her mood and energy. She sleeping well at night without nightmares. Sometimes her heart rate is slow but is probably because of her  antihypertensives. During her recent visit to cardiology no medicines were changed Review of Systems  Constitutional: Positive for appetite change and fatigue.  Psychiatric/Behavioral: Positive for depression, suicidal ideas, sleep disturbance, dysphoric mood and decreased concentration. The patient is nervous/anxious.    Physical Exam not done Depressive Symptoms: depressed mood, anhedonia, insomnia, psychomotor retardation, fatigue, feelings of worthlessness/guilt, hopelessness, suicidal thoughts without plan, anxiety,  (Hypo) Manic Symptoms:   Elevated Mood:  No Irritable Mood:  No Grandiosity:  No Distractibility:  No Labiality of Mood:  No Delusions:  No Hallucinations:  No Impulsivity:  No Sexually Inappropriate Behavior:  No Financial Extravagance:  No Flight of Ideas:  No  Anxiety Symptoms: Excessive Worry:  Yes Panic Symptoms:  Yes Agoraphobia:  Yes Obsessive Compulsive: No  Symptoms: None, Specific Phobias:  No Social Anxiety:  Yes  Psychotic Symptoms:  Hallucinations: No None Delusions:  No Paranoia:  No   Ideas of Reference:  No  PTSD Symptoms: Ever had a traumatic exposure:  No Had a traumatic exposure in the last month:  No Re-experiencing: No None Hypervigilance:  No Hyperarousal: No None Avoidance: No None  Traumatic Brain Injury: No   Past Psychiatric History: Diagnosis: Maj. depression   Hospitalizations: None   Outpatient Care: None   Substance Abuse Care: None   Self-Mutilation: None   Suicidal Attempts: None   Violent Behaviors: None    Past Medical History:   Past Medical History  Diagnosis Date  . Hypertension   . Diabetes mellitus without complication ([place])   . Heart murmur   . Depression   . Anxiety   . GERD (gastroesophageal reflux disease)   . Arthritis   . Gout   . Renal disease 10/2012  . Renal insufficiency   . Renal cancer ([place])    History of Loss of Consciousness:  No Seizure History:  No Cardiac History:   No Allergies:   Allergies  Allergen Reactions  . Skelaxin [Metaxalone] Hives and Rash   Current Medications:  Current Outpatient Prescriptions  Medication Sig Dispense Refill  . amLODipine (NORVASC) 5 MG tablet Take 1 tablet (5 mg total) by mouth daily. 90 tablet 3  . aspirin EC 81 MG tablet Take 1 tablet (81 mg total) by mouth daily.    Marland Kitchen buPROPion (WELLBUTRIN XL) 300 MG 24 hr tablet Take 1 tablet (300 mg total) by mouth every morning. 90 tablet 2  . calcium citrate-vitamin D 500-400 MG-UNIT chewable tablet Chew 1 tablet by mouth 3 (three) times daily.    . carbamazepine (TEGRETOL) 200 MG tablet Take 2 tablets (400 mg total) by mouth daily. 180 tablet 2  . diazepam (VALIUM) 10 MG tablet Take 1 tablet (10 mg total) by mouth 4 (four) times daily. 120 tablet 2  . FERREX 150 150 MG capsule  Take 150 mg by mouth daily.     Marland Kitchen losartan (COZAAR) 50 MG tablet Take 100 mg by mouth daily.     . meclizine (ANTIVERT) 25 MG tablet Take 1 tablet (25 mg total) by mouth every 8 (eight) hours as needed for dizziness. 90 tablet 0  . Multiple Vitamin (MULTIVITAMIN) tablet Take 1 tablet by mouth 3 (three) times daily.     Marland Kitchen omeprazole (PRILOSEC) 20 MG capsule Take 20 mg by mouth daily.    . prazosin (MINIPRESS) 5 MG capsule Take 1 capsule (5 mg total) by mouth at bedtime. 90 capsule 2  . QUEtiapine (SEROQUEL) 50 MG tablet Take 1 tablet (50 mg total) by mouth at bedtime. 90 tablet 2  . vitamin B-12 (CYANOCOBALAMIN) 500 MCG tablet Take 500 mcg by mouth daily.     No current facility-administered medications for this visit.    Previous Psychotropic Medications:  Medication Dose   Xanax   0.5 mg 3 times a day   Vibryd  40 mg every morning                   Substance Abuse History in the last 12 months: Substance Age of 1st Use Last Use Amount Specific Type  Nicotine      Alcohol      Cannabis      Opiates      Cocaine      Methamphetamines      LSD      Ecstasy      Benzodiazepines       Caffeine      Inhalants      Others:                          Medical Consequences of Substance Abuse:n/a  Legal Consequences of Substance Abuse: n/a  Family Consequences of Substance Abuse: n/a  Blackouts:  No DT's:  No Withdrawal Symptoms:  No None  Social History: Current Place of Residence: [place] of Birth: [place] Family Members: Husband 2 children, one nephew 4 grandchildren Marital Status:  Married Children:   Sons: 1  Daughters: 1 Relationships:  Education:  Dentist Problems/Performance:  Religious Beliefs/Practices: Christian History of Abuse: none Occupational Experiences; 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 Legal History: None Hobbies/Interests: Traveling, visiting with grandchildren  Family History:   Family History  Problem Relation Age of Onset  . Heart attack Mother   . Heart attack Father   . Depression Paternal Aunt   . Alcohol abuse Maternal Uncle   . Colon cancer Maternal Aunt   . Colon cancer Maternal Uncle     Mental Status Examination/Evaluation: Objective:  Appearance: Casual and Fairly Groomed appears much thinner   Eye Contact::  Fair  Speech:  Clear and Coherent  Volume:  Normal  Mood: Fairly good   Affect:  A bit constricted but brighter than before   Thought Process:  Intact  Orientation:  Full (Time, Place, and Person)  Thought Content:  WDL  Suicidal Thoughts:no  Homicidal Thoughts:no  Judgement:  Intact  Insight:  Good  Psychomotor Activity:  Normal  Akathisia:  No  Handed:  Right  AIMS (if indicated):    Assets:  Communication Skills Desire for Improvement    Laboratory/X-Ray Psychological Evaluation(s)       Assessment:  Axis I: Generalized Anxiety Disorder and Major Depression, Recurrent severe  AXIS I Generalized Anxiety Disorder and  Major Depression, Recurrent severe  AXIS II Deferred  AXIS III Past Medical History   Diagnosis Date  . Hypertension   . Diabetes mellitus without complication ([place])   . Heart murmur   . Depression   . Anxiety   . GERD (gastroesophageal reflux disease)   . Arthritis   . Gout   . Renal disease 10/2012  . Renal insufficiency   . Renal cancer ([place])      AXIS IV other psychosocial or environmental problems  AXIS V 51-60 moderate symptoms   Treatment Plan/Recommendations:  Plan of Care: Medication management   Laboratory:   Psychotherapy:    Medications: She'll continue Wellbutrin  300 mg XL daily, continue Tegretol and Seroquel for mood stabilization and Valium for anxiety and prazosin 5 mg at bedtime for nightmares   Routine PRN Medications:  No  Consultations:   Safety Concerns:  She contracts for safety   Other:    return in  3 months     [name], MD 4/4/20174:38 PM

## 2015-08-04 DIAGNOSIS — M2012 Hallux valgus (acquired), left foot: Secondary | ICD-10-CM | POA: Insufficient documentation

## 2015-08-04 DIAGNOSIS — M205X2 Other deformities of toe(s) (acquired), left foot: Secondary | ICD-10-CM | POA: Insufficient documentation

## 2015-09-28 ENCOUNTER — Encounter (HOSPITAL_COMMUNITY): Payer: Self-pay | Admitting: Psychiatry

## 2015-09-28 ENCOUNTER — Ambulatory Visit (INDEPENDENT_AMBULATORY_CARE_PROVIDER_SITE_OTHER): Payer: Self-pay | Admitting: Psychiatry

## 2015-09-28 VITALS — BP 143/75 | HR 60 | Ht 71.0 in | Wt 218.2 lb

## 2015-09-28 DIAGNOSIS — F322 Major depressive disorder, single episode, severe without psychotic features: Secondary | ICD-10-CM

## 2015-09-28 MED ORDER — QUETIAPINE FUMARATE 50 MG PO TABS: 50.0000 mg | ORAL_TABLET | Freq: Every day | ORAL | Status: DC

## 2015-09-28 MED ORDER — PRAZOSIN HCL 5 MG PO CAPS: 5.0000 mg | ORAL_CAPSULE | Freq: Every day | ORAL | Status: DC

## 2015-09-28 MED ORDER — CARBAMAZEPINE 200 MG PO TABS: 400.0000 mg | ORAL_TABLET | Freq: Every day | ORAL | Status: DC

## 2015-09-28 MED ORDER — DIAZEPAM 10 MG PO TABS: 10.0000 mg | ORAL_TABLET | Freq: Four times a day (QID) | ORAL | Status: DC

## 2015-09-28 MED ORDER — BUPROPION HCL ER (XL) 300 MG PO TB24: 300.0000 mg | ORAL_TABLET | ORAL | Status: DC

## 2015-09-28 NOTE — Progress Notes (Signed)
Patient ID: April Ward, female   DOB: [birth year], 55 y.o.   MRN: [id number] Patient ID: April Ward, female   DOB: [birth year], 55 y.o.   MRN: [id number] Patient ID: April Ward, female   DOB: 1960-08-19, 55 y.o.   MRN: [id number] Patient ID: April Ward, female   DOB: 07/24/1960, 55 y.o.   MRN: [id number] Patient ID: April Ward, female   DOB: Feb 19, 1961, 55 y.o.   MRN: [id number] Patient ID: April Ward, female   DOB: March 05, 1961, 55 y.o.   MRN: [id number] Patient ID: April Ward, female   DOB: 1960-10-12, 55 y.o.   MRN: [id number] Patient ID: April Ward, female   DOB: 12-24-1960, 55 y.o.   MRN: [id number] Patient ID: April Ward, female   DOB: 04-24-1960, 55 y.o.   MRN: [id number] Patient ID: April Ward, female   DOB: 1960-05-08, 55 y.o.   MRN: [id number] Patient ID: April Ward, female   DOB: 1960/08/06, 55 y.o.   MRN: [id number] Patient ID: April Ward, female   DOB: [birth year], 55 y.o.   MRN: [id number] Patient ID: April Ward, female   DOB: Aug 15, 1960, 55 y.o.   MRN: [id number] Patient ID: April Ward, female   DOB: 06-Aug-1960, 55 y.o.   MRN: [id number] Patient ID: April Ward, female   DOB: [birth year], 55 y.o.   MRN: [id number]  Psychiatric Assessment Adult  Patient Identification:  April Ward Date of Evaluation:  09/28/2015 Chief Complaint: "I'm doing better " History of Chief Complaint:   Chief Complaint  Patient presents with  . Depression  . Anxiety  . Follow-up    Depression        Associated symptoms include decreased concentration, fatigue, appetite change and suicidal ideas.  Past medical history includes anxiety.   Anxiety Symptoms include decreased concentration, nervous/anxious behavior and suicidal ideas.     this patient is a 55 year old married black female who lives with her husband, and 83 year old son and 6 year-old nephew  In [place]. 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 The patient states that she had a cyst removed in her left kidney in December. After she had the surgery initially she was told everything was okay. She later found that 60% of her kidney was removed and that she had renal cancer. She's very upset because of the conflicting information. She also developed severe anemia and probably will have to on iron infusion. She now doesn't trust the doctors because she doesn't know what to believe. She is scheduled to see an oncologist to make sure she doesn't need any more treatment for cancer. Her nephrologist so has released her and states she needs to come back in 6 months.  The patient travels 3 hours to her job and stays there in an apartment. She's been doing this since June. Now however she doesn't feel able to function. She's been very depressed since the surgery and the news about the cancer. She's been crying all the time. Her thoughts are racing and she is unable to sleep. She's very tired and doesn't have any appetite. She's had passive suicidal ideation but no specific plan. She doesn't want to be around people because she gets so upset.  The patient had a depressive episode in May. At that time her sisters  were upsetting her. Her mother died in 07/06/11 and left her property to the patient and her 2 sisters. 2 sisters are not good about paying bills and the light bill is in her name. Her primary doctor had put her on  Vibryd which helped but her insurance did not cover it so she is no longer taking it. She's never been on any other antidepressants or had previous psychiatric treatment  The patient returns after 3 months. She is doing ok. She had to have foot surgery and there is a little spot that is not healing so she is in a soft boot right now. She is frustrated because she cannot exercise. She's not regained any of the weight however. She also had a thyroid nodule  which was recently biopsied. Her thyroid labs are normal by her report. She states she is also had problems with vertigo lately and an ENT doctor evaluated this and is going to order more studies. All of these medical problems are frustrating her. Overall however she is functioning fairly well and thinks the medications are helping. She is not having nightmares like she use to Review of Systems  Constitutional: Positive for appetite change and fatigue.  Psychiatric/Behavioral: Positive for depression, suicidal ideas, sleep disturbance, dysphoric mood and decreased concentration. The patient is nervous/anxious.    Physical Exam not done Depressive Symptoms: depressed mood, anhedonia, insomnia, psychomotor retardation, fatigue, feelings of worthlessness/guilt, hopelessness, suicidal thoughts without plan, anxiety,  (Hypo) Manic Symptoms:   Elevated Mood:  No Irritable Mood:  No Grandiosity:  No Distractibility:  No Labiality of Mood:  No Delusions:  No Hallucinations:  No Impulsivity:  No Sexually Inappropriate Behavior:  No Financial Extravagance:  No Flight of Ideas:  No  Anxiety Symptoms: Excessive Worry:  Yes Panic Symptoms:  Yes Agoraphobia:  Yes Obsessive Compulsive: No  Symptoms: None, Specific Phobias:  No Social Anxiety:  Yes  Psychotic Symptoms:  Hallucinations: No None Delusions:  No Paranoia:  No   Ideas of Reference:  No  PTSD Symptoms: Ever had a traumatic exposure:  No Had a traumatic exposure in the last month:  No Re-experiencing: No None Hypervigilance:  No Hyperarousal: No None Avoidance: No None  Traumatic Brain Injury: No   Past Psychiatric History: Diagnosis: Maj. depression   Hospitalizations: None   Outpatient Care: None   Substance Abuse Care: None   Self-Mutilation: None   Suicidal Attempts: None   Violent Behaviors: None    Past Medical History:   Past Medical History  Diagnosis Date  . Hypertension   . Diabetes mellitus  without complication ([place])   . Heart murmur   . Depression   . Anxiety   . GERD (gastroesophageal reflux disease)   . Arthritis   . Gout   . Renal disease 10/2012  . Renal insufficiency   . Renal cancer ([place])    History of Loss of Consciousness:  No Seizure History:  No Cardiac History:  No Allergies:   Allergies  Allergen Reactions  . Skelaxin [Metaxalone] Hives and Rash   Current Medications:  Current Outpatient Prescriptions  Medication Sig Dispense Refill  . amLODipine (NORVASC) 5 MG tablet Take 1 tablet (5 mg total) by mouth daily. 90 tablet 3  . aspirin EC 81 MG tablet Take 1 tablet (81 mg total) by mouth daily.    Marland Kitchen buPROPion (WELLBUTRIN XL) 300 MG 24 hr tablet Take 1 tablet (300 mg total) by mouth every morning. 90 tablet 2  . calcium citrate-vitamin D 500-400  MG-UNIT chewable tablet Chew 1 tablet by mouth 3 (three) times daily.    . carbamazepine (TEGRETOL) 200 MG tablet Take 2 tablets (400 mg total) by mouth daily. 180 tablet 2  . diazepam (VALIUM) 10 MG tablet Take 1 tablet (10 mg total) by mouth 4 (four) times daily. 120 tablet 2  . FERREX 150 150 MG capsule Take 150 mg by mouth daily.     Marland Kitchen losartan (COZAAR) 50 MG tablet Take 100 mg by mouth daily.     . meclizine (ANTIVERT) 25 MG tablet Take 1 tablet (25 mg total) by mouth every 8 (eight) hours as needed for dizziness. 90 tablet 0  . Multiple Vitamin (MULTIVITAMIN) tablet Take 1 tablet by mouth 3 (three) times daily.     Marland Kitchen omeprazole (PRILOSEC) 20 MG capsule Take 20 mg by mouth daily.    . prazosin (MINIPRESS) 5 MG capsule Take 1 capsule (5 mg total) by mouth at bedtime. 90 capsule 2  . QUEtiapine (SEROQUEL) 50 MG tablet Take 1 tablet (50 mg total) by mouth at bedtime. 90 tablet 2  . vitamin B-12 (CYANOCOBALAMIN) 500 MCG tablet Take 500 mcg by mouth daily.     No current facility-administered medications for this visit.    Previous Psychotropic Medications:  Medication Dose   Xanax   0.5 mg 3 times a day    Vibryd  40 mg every morning                   Substance Abuse History in the last 12 months: Substance Age of 1st Use Last Use Amount Specific Type  Nicotine      Alcohol      Cannabis      Opiates      Cocaine      Methamphetamines      LSD      Ecstasy      Benzodiazepines      Caffeine      Inhalants      Others:                          Medical Consequences of Substance Abuse:n/a  Legal Consequences of Substance Abuse: n/a  Family Consequences of Substance Abuse: n/a  Blackouts:  No DT's:  No Withdrawal Symptoms:  No None  Social History: Current Place of Residence: [place] of Birth: [place] Family Members: Husband 2 children, one nephew 4 grandchildren Marital Status:  Married Children:   Sons: 1  Daughters: 1 Relationships:  Education:  Dentist Problems/Performance:  Religious Beliefs/Practices: Christian History of Abuse: none Occupational Experiences; 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 Legal History: None Hobbies/Interests: Traveling, visiting with grandchildren  Family History:   Family History  Problem Relation Age of Onset  . Heart attack Mother   . Heart attack Father   . Depression Paternal Aunt   . Alcohol abuse Maternal Uncle   . Colon cancer Maternal Aunt   . Colon cancer Maternal Uncle     Mental Status Examination/Evaluation: Objective:  Appearance: Casual and Fairly Groomed appears much thinner   Eye Contact::  Fair  Speech:  Clear and Coherent  Volume:  Normal  Mood: Fairly good   Affect:  A bit constricted   Thought Process:  Intact  Orientation:  Full (Time, Place, and Person)  Thought Content:  WDL  Suicidal Thoughts:no  Homicidal Thoughts:no  Judgement:  Intact  Insight:  Good  Psychomotor Activity:  Normal  Akathisia:  No  Handed:  Right  AIMS (if indicated):    Assets:  Communication Skills Desire for Improvement     Laboratory/X-Ray Psychological Evaluation(s)       Assessment:  Axis I: Generalized Anxiety Disorder and Major Depression, Recurrent severe  AXIS I Generalized Anxiety Disorder and Major Depression, Recurrent severe  AXIS II Deferred  AXIS III Past Medical History  Diagnosis Date  . Hypertension   . Diabetes mellitus without complication ([place])   . Heart murmur   . Depression   . Anxiety   . GERD (gastroesophageal reflux disease)   . Arthritis   . Gout   . Renal disease 10/2012  . Renal insufficiency   . Renal cancer ([place])      AXIS IV other psychosocial or environmental problems  AXIS V 51-60 moderate symptoms   Treatment Plan/Recommendations:  Plan of Care: Medication management   Laboratory:   Psychotherapy:    Medications: She'll continue Wellbutrin  300 mg XL daily, continue Tegretol and Seroquel for mood stabilization and Valium for anxiety and prazosin 5 mg at bedtime for nightmares   Routine PRN Medications:  No  Consultations:   Safety Concerns:  She contracts for safety   Other:    return in  3 months     [name], MD 6/28/20174:16 PM

## 2015-10-18 DIAGNOSIS — E042 Nontoxic multinodular goiter: Secondary | ICD-10-CM | POA: Insufficient documentation

## 2015-11-23 ENCOUNTER — Encounter: Payer: Self-pay | Admitting: Podiatry

## 2015-12-01 NOTE — Progress Notes (Signed)
This encounter was created in error - please disregard.

## 2015-12-29 ENCOUNTER — Ambulatory Visit (HOSPITAL_COMMUNITY): Payer: Self-pay | Admitting: Psychiatry

## 2016-01-10 ENCOUNTER — Ambulatory Visit (INDEPENDENT_AMBULATORY_CARE_PROVIDER_SITE_OTHER): Payer: BLUE CROSS/BLUE SHIELD | Admitting: Psychiatry

## 2016-01-10 ENCOUNTER — Encounter (HOSPITAL_COMMUNITY): Payer: Self-pay | Admitting: Psychiatry

## 2016-01-10 DIAGNOSIS — F322 Major depressive disorder, single episode, severe without psychotic features: Secondary | ICD-10-CM | POA: Diagnosis not present

## 2016-01-10 DIAGNOSIS — Z8249 Family history of ischemic heart disease and other diseases of the circulatory system: Secondary | ICD-10-CM

## 2016-01-10 DIAGNOSIS — Z800 Family history of malignant neoplasm of digestive organs: Secondary | ICD-10-CM

## 2016-01-10 DIAGNOSIS — Z811 Family history of alcohol abuse and dependence: Secondary | ICD-10-CM

## 2016-01-10 DIAGNOSIS — Z818 Family history of other mental and behavioral disorders: Secondary | ICD-10-CM | POA: Diagnosis not present

## 2016-01-10 DIAGNOSIS — F411 Generalized anxiety disorder: Secondary | ICD-10-CM | POA: Diagnosis not present

## 2016-01-10 MED ORDER — CARBAMAZEPINE 200 MG PO TABS: 400.0000 mg | ORAL_TABLET | Freq: Every day | ORAL | 2 refills | Status: DC

## 2016-01-10 MED ORDER — DIAZEPAM 10 MG PO TABS: 10.0000 mg | ORAL_TABLET | Freq: Four times a day (QID) | ORAL | 2 refills | Status: DC

## 2016-01-10 MED ORDER — BUPROPION HCL ER (XL) 300 MG PO TB24: 300.0000 mg | ORAL_TABLET | ORAL | 2 refills | Status: DC

## 2016-01-10 MED ORDER — PRAZOSIN HCL 5 MG PO CAPS: 5.0000 mg | ORAL_CAPSULE | Freq: Every day | ORAL | 2 refills | Status: DC

## 2016-01-10 MED ORDER — QUETIAPINE FUMARATE 50 MG PO TABS: 50.0000 mg | ORAL_TABLET | Freq: Every day | ORAL | 2 refills | Status: DC

## 2016-01-10 NOTE — Progress Notes (Signed)
Patient ID: April Ward, female   DOB: 1961/03/02, 55 y.o.   MRN: [id number] Patient ID: April Ward, female   DOB: 09/11/1960, 55 y.o.   MRN: [id number] Patient ID: April Ward, female   DOB: Sep 11, 1960, 55 y.o.   MRN: [id number] Patient ID: April Ward, female   DOB: 1961/01/02, 56 y.o.   MRN: [id number] Patient ID: April Ward, female   DOB: [birth year], 55 y.o.   MRN: [id number] Patient ID: April Ward, female   DOB: March 09, 1961, 55 y.o.   MRN: [id number] Patient ID: April Ward, female   DOB: 05-27-1960, 55 y.o.   MRN: [id number] Patient ID: April Ward, female   DOB: 16-Jun-1960, 55 y.o.   MRN: [id number] Patient ID: April Ward, female   DOB: March 09, 1961, 55 y.o.   MRN: [id number] Patient ID: April Ward, female   DOB: [birth year], 55 y.o.   MRN: [id number] Patient ID: April Ward, female   DOB: Feb 13, 1961, 55 y.o.   MRN: [id number] Patient ID: April Ward, female   DOB: June 27, 1960, 55 y.o.   MRN: [id number] Patient ID: April Ward, female   DOB: [birth year], 55 y.o.   MRN: [id number] Patient ID: April Ward, female   DOB: 12/03/1960, 55 y.o.   MRN: [id number] Patient ID: April Ward, female   DOB: [birth year], 55 y.o.   MRN: [id number]  Psychiatric Assessment Adult  Patient Identification:  April Ward Date of Evaluation:  01/10/2016 Chief Complaint: "I'm doing better " History of Chief Complaint:   Chief Complaint  Patient presents with  . Depression  . Anxiety  . Follow-up    Depression         Associated symptoms include decreased concentration, fatigue, appetite change and suicidal ideas.  Past medical history includes anxiety.   Anxiety  Symptoms include decreased concentration, nervous/anxious behavior and suicidal ideas.     this patient is a 55 year old married black female who lives with her husband, and 40 year old son and 57 year-old nephew  In [place]. 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 The patient states that she had a cyst removed in her left kidney in December. After she had the surgery initially she was told everything was okay. She later found that 60% of her kidney was removed and that she had renal cancer. She's very upset because of the conflicting information. She also developed severe anemia and probably will have to on iron infusion. She now doesn't trust the doctors because she doesn't know what to believe. She is scheduled to see an oncologist to make sure she doesn't need any more treatment for cancer. Her nephrologist so has released her and states she needs to come back in 6 months.  The patient travels 3 hours to her job and stays there in an apartment. She's been doing this since June. Now however she doesn't feel able to function. She's been very depressed since the surgery and the news about the cancer. She's been crying all the time. Her thoughts are racing and she is unable to sleep. She's very tired and doesn't have any appetite. She's had passive suicidal ideation but no specific plan. She doesn't want to be around people because she gets so upset.  The patient had a depressive episode in May. At that time  her sisters were upsetting her. Her mother died in 07-26-11 and left her property to the patient and her 2 sisters. 2 sisters are not good about paying bills and the light bill is in her name. Her primary doctor had put her on  Vibryd which helped but her insurance did not cover it so she is no longer taking it. She's never been on any other antidepressants or had previous psychiatric treatment  The patient returns after 3 months. She was diagnosed with thyroid cancer and had a thyroidectomy in August followed by radioactive iodine treatment. She is now cancer free and on thyroid replacement. She gets frustrated with all her medical setbacks but for now she seems to be  okay. Her mood is up and down but she still feels that her medications have been helpful. She's no longer having nightmares and sleeps well most of the time. She is just now getting her energy back so she can start back to her exercise regimen Review of Systems  Constitutional: Positive for appetite change and fatigue.  Psychiatric/Behavioral: Positive for decreased concentration, depression, dysphoric mood, sleep disturbance and suicidal ideas. The patient is nervous/anxious.    Physical Exam not done Depressive Symptoms: depressed mood, anhedonia, insomnia, psychomotor retardation, fatigue, feelings of worthlessness/guilt, hopelessness, suicidal thoughts without plan, anxiety,  (Hypo) Manic Symptoms:   Elevated Mood:  No Irritable Mood:  No Grandiosity:  No Distractibility:  No Labiality of Mood:  No Delusions:  No Hallucinations:  No Impulsivity:  No Sexually Inappropriate Behavior:  No Financial Extravagance:  No Flight of Ideas:  No  Anxiety Symptoms: Excessive Worry:  Yes Panic Symptoms:  Yes Agoraphobia:  Yes Obsessive Compulsive: No  Symptoms: None, Specific Phobias:  No Social Anxiety:  Yes  Psychotic Symptoms:  Hallucinations: No None Delusions:  No Paranoia:  No   Ideas of Reference:  No  PTSD Symptoms: Ever had a traumatic exposure:  No Had a traumatic exposure in the last month:  No Re-experiencing: No None Hypervigilance:  No Hyperarousal: No None Avoidance: No None  Traumatic Brain Injury: No   Past Psychiatric History: Diagnosis: Maj. depression   Hospitalizations: None   Outpatient Care: None   Substance Abuse Care: None   Self-Mutilation: None   Suicidal Attempts: None   Violent Behaviors: None    Past Medical History:   Past Medical History:  Diagnosis Date  . Anxiety   . Arthritis   . Depression   . Diabetes mellitus without complication ([place])   . GERD (gastroesophageal reflux disease)   . Gout   . Heart murmur   .  Hypertension   . Renal cancer ([place])   . Renal disease 10/2012  . Renal insufficiency    History of Loss of Consciousness:  No Seizure History:  No Cardiac History:  No Allergies:   Allergies  Allergen Reactions  . Skelaxin [Metaxalone] Hives and Rash   Current Medications:  Current Outpatient Prescriptions  Medication Sig Dispense Refill  . amLODipine (NORVASC) 5 MG tablet Take 1 tablet (5 mg total) by mouth daily. 90 tablet 3  . buPROPion (WELLBUTRIN XL) 300 MG 24 hr tablet Take 1 tablet (300 mg total) by mouth every morning. 90 tablet 2  . calcium citrate-vitamin D 500-400 MG-UNIT chewable tablet Chew 1 tablet by mouth 3 (three) times daily.    . carbamazepine (TEGRETOL) 200 MG tablet Take 2 tablets (400 mg total) by mouth daily. 180 tablet 2  . diazepam (VALIUM) 10 MG tablet Take 1 tablet (10  mg total) by mouth 4 (four) times daily. 120 tablet 2  . furosemide (LASIX) 40 MG tablet Take 40 mg by mouth as needed.    Marland Kitchen levothyroxine (SYNTHROID, LEVOTHROID) 200 MCG tablet Take 200 mcg by mouth daily before breakfast.    . losartan (COZAAR) 50 MG tablet Take 100 mg by mouth daily.     . meclizine (ANTIVERT) 25 MG tablet Take 1 tablet (25 mg total) by mouth every 8 (eight) hours as needed for dizziness. 90 tablet 0  . Multiple Vitamin (MULTIVITAMIN) tablet Take 1 tablet by mouth 3 (three) times daily.     Marland Kitchen nystatin (MYCOSTATIN/NYSTOP) powder as needed.    Marland Kitchen omeprazole (PRILOSEC) 20 MG capsule Take 20 mg by mouth daily.    . prazosin (MINIPRESS) 5 MG capsule Take 1 capsule (5 mg total) by mouth at bedtime. 90 capsule 2  . QUEtiapine (SEROQUEL) 50 MG tablet Take 1 tablet (50 mg total) by mouth at bedtime. 90 tablet 2  . vitamin B-12 (CYANOCOBALAMIN) 500 MCG tablet Take 500 mcg by mouth daily.     No current facility-administered medications for this visit.     Previous Psychotropic Medications:  Medication Dose   Xanax   0.5 mg 3 times a day   Vibryd  40 mg every morning                    Substance Abuse History in the last 12 months: Substance Age of 1st Use Last Use Amount Specific Type  Nicotine      Alcohol      Cannabis      Opiates      Cocaine      Methamphetamines      LSD      Ecstasy      Benzodiazepines      Caffeine      Inhalants      Others:                          Medical Consequences of Substance Abuse:n/a  Legal Consequences of Substance Abuse: n/a  Family Consequences of Substance Abuse: n/a  Blackouts:  No DT's:  No Withdrawal Symptoms:  No None  Social History: Current Place of Residence: [place] of Birth: [place] Family Members: Husband 2 children, one nephew 4 grandchildren Marital Status:  Married Children:   Sons: 1  Daughters: 1 Relationships:  Education:  Dentist Problems/Performance:  Religious Beliefs/Practices: Christian History of Abuse: none Occupational Experiences; 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 Legal History: None Hobbies/Interests: Traveling, visiting with grandchildren  Family History:   Family History  Problem Relation Age of Onset  . Heart attack Mother   . Heart attack Father   . Depression Paternal Aunt   . Alcohol abuse Maternal Uncle   . Colon cancer Maternal Aunt   . Colon cancer Maternal Uncle     Mental Status Examination/Evaluation: Objective:  Appearance: Casual and Fairly Groomed   Engineer, water::  Fair  Speech:  Clear and Coherent  Volume:  Normal  Mood: Fairly good   Affect:  A bit constricted   Thought Process:  Intact  Orientation:  Full (Time, Place, and Person)  Thought Content:  WDL  Suicidal Thoughts:no  Homicidal Thoughts:no  Judgement:  Intact  Insight:  Good  Psychomotor Activity:  Normal  Akathisia:  No  Handed:  Right  AIMS (if indicated):  Assets:  Communication Skills Desire for Improvement    Laboratory/X-Ray Psychological Evaluation(s)       Assessment:  Axis  I: Generalized Anxiety Disorder and Major Depression, Recurrent severe  AXIS I Generalized Anxiety Disorder and Major Depression, Recurrent severe  AXIS II Deferred  AXIS III Past Medical History:  Diagnosis Date  . Anxiety   . Arthritis   . Depression   . Diabetes mellitus without complication ([place])   . GERD (gastroesophageal reflux disease)   . Gout   . Heart murmur   . Hypertension   . Renal cancer ([place])   . Renal disease 10/2012  . Renal insufficiency      AXIS IV other psychosocial or environmental problems  AXIS V 51-60 moderate symptoms   Treatment Plan/Recommendations:  Plan of Care: Medication management   Laboratory:   Psychotherapy:    Medications: She'll continue Wellbutrin  300 mg XL daily, continue Tegretol and Seroquel for mood stabilization and Valium for anxiety and prazosin 5 mg at bedtime for nightmares   Routine PRN Medications:  No  Consultations:   Safety Concerns:  She contracts for safety   Other:    return in  3 months     Marletta Bousquet, Neoma Laming, MD 10/10/20172:11 PM

## 2016-01-18 ENCOUNTER — Other Ambulatory Visit (HOSPITAL_COMMUNITY): Payer: Self-pay | Admitting: Psychiatry

## 2016-02-15 ENCOUNTER — Ambulatory Visit (INDEPENDENT_AMBULATORY_CARE_PROVIDER_SITE_OTHER): Payer: Self-pay

## 2016-02-15 ENCOUNTER — Encounter: Payer: Self-pay | Admitting: Orthopedic Surgery

## 2016-02-15 ENCOUNTER — Ambulatory Visit (INDEPENDENT_AMBULATORY_CARE_PROVIDER_SITE_OTHER): Payer: Self-pay | Admitting: Orthopedic Surgery

## 2016-02-15 VITALS — BP 189/102 | HR 71 | Ht 71.0 in | Wt 209.0 lb

## 2016-02-15 DIAGNOSIS — M25512 Pain in left shoulder: Secondary | ICD-10-CM

## 2016-02-15 DIAGNOSIS — M7552 Bursitis of left shoulder: Secondary | ICD-10-CM

## 2016-02-15 NOTE — Patient Instructions (Addendum)
You have received an injection of steroids into the joint. 15% of patients will have increased pain within the 24 hours postinjection.   This is transient and will go away.   We recommend that you use ice packs on the injection site for 20 minutes every 2 hours and extra strength Tylenol 2 tablets every 8 as needed until the pain resolves.  If you continue to have pain after taking the Tylenol and using the ice please call the office for further instructions.   Bursitis Introduction Bursitis is when the fluid-filled sac (bursa) that covers and protects a joint is swollen (inflamed). Bursitis is most common near joints, especially the knees, elbows, hips, and shoulders. Follow these instructions at home:  Take medicines only as told by your doctor.  If you were prescribed an antibiotic medicine, finish it all even if you start to feel better.  Rest the affected area as told by your doctor.  Keep the area raised up.  Avoid doing things that make the pain worse.  Apply ice to the injured area:  Place ice in a plastic bag.  Place a towel between your skin and the bag.  Leave the ice on for 20 minutes, 2-3 times a day.  Use splints, braces, pads, or walking aids as told by your doctor.  Keep all follow-up visits as told by your doctor. This is important. Contact a doctor if:  You have more pain with home care.  You have a fever.  You have chills. This information is not intended to replace advice given to you by your health care provider. Make sure you discuss any questions you have with your health care provider. Document Released: 09/06/2009 Document Revised: 08/25/2015 Document Reviewed: 06/08/2013  2017 [employer]

## 2016-02-15 NOTE — Progress Notes (Signed)
Patient ID: April Ward, female   DOB: [birth year], 55 y.o.   MRN: [id number]  Chief Complaint  Patient presents with  . Follow-up    Recheck on left shoulder pain.    HPI April Ward is a 55 y.o. female.  Presents for evaluation of pain and crepitance in the left shoulder. She reports no trauma to the left shoulder. She does exercise a lot. She complains of catching when bringing her arm down with weakness when she is lifting weights. She does have pain with lifting arm above her shoulder  Review of Systems Review of Systems  Respiratory: Negative for shortness of breath.   Cardiovascular: Negative for chest pain.  Gastrointestinal: Negative for nausea.    Past Medical History:  Diagnosis Date  . Anxiety   . Arthritis   . Depression   . Diabetes mellitus without complication ([place])   . GERD (gastroesophageal reflux disease)   . Gout   . Heart murmur   . Hypertension   . Renal cancer (April Ward)   . Renal disease 10/2012  . Renal insufficiency     Past Surgical History:  Procedure Laterality Date  . CESAREAN SECTION  [id number]  . CHONDROPLASTY Right 08/22/2012   Procedure: CHONDROPLASTY;  Surgeon: Carole Civil, MD;  Location: [facility];  Service: Orthopedics;  Laterality: Right;  . COLONOSCOPY WITH PROPOFOL N/A 03/16/2014   Procedure: COLONOSCOPY WITH PROPOFOL (at cecum 1023, total withdrawal time=9 minutes);  Surgeon: Danie Binder, MD;  Location: [facility];  Service: Endoscopy;  Laterality: N/A;  . GASTRIC BYPASS    . KNEE ARTHROSCOPY WITH MEDIAL MENISECTOMY Right 08/22/2012   Procedure: KNEE ARTHROSCOPY WITH PARTIAL MEDIAL MENISECTOMY;  Surgeon: Carole Civil, MD;  Location: [facility];  Service: Orthopedics;  Laterality: Right;  . PARTIAL NEPHRECTOMY  Dec 2014   left  . TENDON REPAIR      Social History Social History  Substance Use Topics  . Smoking status: Never Smoker  . Smokeless tobacco: Never Used  . Alcohol use No    Allergies  Allergen Reactions  .  Skelaxin [Metaxalone] Hives and Rash    No outpatient prescriptions have been marked as taking for the 02/15/16 encounter (Office Visit) with Carole Civil, MD.      Physical Exam Physical Exam BP (!) 189/102   Pulse 71   Ht 5\' 11"  (1.803 m)   Wt 209 lb (94.8 kg)   BMI 29.15 kg/m   Gen. appearance. The patient is well-developed and well-nourished, grooming and hygiene are normal. There are no gross congenital abnormalities  The patient is alert and oriented to person place and time  Mood and affect are normal  Ambulation remains normal  Examination reveals the following: On inspection we find tenderness in the left shoulder with high level of crepitance on range of motion in all planes  With the range of motion of  normal internal rotation external rotation painful forward elevation and 120 passive range of motion up to 180  Stability tests were normal  in abduction external rotation  Impingement sign was positive at 120  Strength tests revealed grade 5 motor strength  Skin we find no rash ulceration or erythema  Sensation remains intact  Impression vascular system shows no peripheral edema  Data Reviewed Plain films show before AC joint arthritis and mild bone related changes consistent with chronic rotator cuff disease  Assessment    Encounter Diagnoses  Name Primary?  . Pain in joint of left shoulder  Yes  . Bursitis of left shoulder        Plan  Recommend shoulder exercises with a pep pad for home use     Procedure note the subacromial injection shoulder left   Verbal consent was obtained to inject the  Left   Shoulder  Timeout was completed to confirm the injection site is a subacromial space of the  left  shoulder  Medication used Depo-Medrol 40 mg and lidocaine 1% 3 cc  Anesthesia was provided by ethyl chloride  The injection was performed in the left  posterior subacromial space. After pinning the skin with alcohol and anesthetized  the skin with ethyl chloride the subacromial space was injected using a 20-gauge needle. There were no complications  Sterile dressing was applied.               Arther Abbott 02/15/2016, 10:40 AM

## 2016-02-27 ENCOUNTER — Encounter: Payer: Self-pay | Admitting: Orthopedic Surgery

## 2016-03-08 ENCOUNTER — Ambulatory Visit (INDEPENDENT_AMBULATORY_CARE_PROVIDER_SITE_OTHER): Payer: BLUE CROSS/BLUE SHIELD | Admitting: Podiatry

## 2016-03-08 ENCOUNTER — Encounter: Payer: Self-pay | Admitting: Podiatry

## 2016-03-08 VITALS — BP 186/156 | HR 66 | Temp 97.8°F | Ht 71.5 in | Wt 212.0 lb

## 2016-03-08 DIAGNOSIS — B351 Tinea unguium: Secondary | ICD-10-CM | POA: Diagnosis not present

## 2016-03-08 DIAGNOSIS — L600 Ingrowing nail: Secondary | ICD-10-CM | POA: Diagnosis not present

## 2016-03-11 NOTE — Progress Notes (Signed)
Subjective:     Patient ID: April Ward, female   DOB: [birth year], 55 y.o.   MRN: [id number]  HPI patient states my left nail has become loose irritated with some drainage and redness and pain. Do not remember specific injury   Review of Systems  All other systems reviewed and are negative.      Objective:   Physical Exam  Constitutional: She is oriented to person, place, and time.  Cardiovascular: Intact distal pulses.   Musculoskeletal: Normal range of motion.  Neurological: She is oriented to person, place, and time.  Skin: Skin is warm.  Nursing note and vitals reviewed.  neurovascular status intact muscle strength adequate range of motion within normal limits with patient noted to have a damaged left hallux nail that's loose when palpated with proximal erythema and no active drainage noted. Patient does have good digital perfusion and is well oriented 3     Assessment:     Damaged nail plate left hallux with looseness noted and redness    Plan:     H&P conditions reviewed and at this point I recommended removal of the nail flush the bed out Nail to regrow and explained may or may not regrow normally. I did infiltrate the left hallux 60 mg like Marcaine mixture remove the hallux nail flush the bed and remove tissue and applied sterile dressing. Gave instructions on soaks and reappoint

## 2016-03-21 ENCOUNTER — Encounter: Payer: Self-pay | Admitting: Podiatry

## 2016-03-30 ENCOUNTER — Ambulatory Visit: Payer: BLUE CROSS/BLUE SHIELD | Admitting: Podiatry

## 2016-04-10 ENCOUNTER — Encounter (HOSPITAL_COMMUNITY): Payer: Self-pay | Admitting: Psychiatry

## 2016-04-10 ENCOUNTER — Ambulatory Visit (INDEPENDENT_AMBULATORY_CARE_PROVIDER_SITE_OTHER): Payer: BLUE CROSS/BLUE SHIELD | Admitting: Psychiatry

## 2016-04-10 VITALS — BP 160/92 | Ht 71.0 in | Wt 216.0 lb

## 2016-04-10 DIAGNOSIS — Z888 Allergy status to other drugs, medicaments and biological substances status: Secondary | ICD-10-CM

## 2016-04-10 DIAGNOSIS — Z79899 Other long term (current) drug therapy: Secondary | ICD-10-CM

## 2016-04-10 DIAGNOSIS — Z8249 Family history of ischemic heart disease and other diseases of the circulatory system: Secondary | ICD-10-CM

## 2016-04-10 DIAGNOSIS — Z818 Family history of other mental and behavioral disorders: Secondary | ICD-10-CM

## 2016-04-10 DIAGNOSIS — Z800 Family history of malignant neoplasm of digestive organs: Secondary | ICD-10-CM

## 2016-04-10 DIAGNOSIS — Z811 Family history of alcohol abuse and dependence: Secondary | ICD-10-CM

## 2016-04-10 DIAGNOSIS — F322 Major depressive disorder, single episode, severe without psychotic features: Secondary | ICD-10-CM

## 2016-04-10 MED ORDER — PRAZOSIN HCL 5 MG PO CAPS: 5.0000 mg | ORAL_CAPSULE | Freq: Every day | ORAL | 2 refills | Status: DC

## 2016-04-10 MED ORDER — QUETIAPINE FUMARATE 50 MG PO TABS: 50.0000 mg | ORAL_TABLET | Freq: Every day | ORAL | 2 refills | Status: DC

## 2016-04-10 MED ORDER — BUPROPION HCL ER (XL) 300 MG PO TB24: 300.0000 mg | ORAL_TABLET | ORAL | 2 refills | Status: DC

## 2016-04-10 MED ORDER — DIAZEPAM 10 MG PO TABS: 10.0000 mg | ORAL_TABLET | Freq: Four times a day (QID) | ORAL | 2 refills | Status: DC

## 2016-04-10 MED ORDER — CARBAMAZEPINE 200 MG PO TABS: 400.0000 mg | ORAL_TABLET | Freq: Every day | ORAL | 2 refills | Status: DC

## 2016-04-10 NOTE — Progress Notes (Signed)
Patient ID: April Ward, female   DOB: [birth year], 56 y.o.   MRN: [id number] Patient ID: April Ward, female   DOB: Jan 13, 1961, 56 y.o.   MRN: [id number] Patient ID: April Ward, female   DOB: 05/23/1960, 56 y.o.   MRN: [id number] Patient ID: April Ward, female   DOB: 10/24/1960, 56 y.o.   MRN: [id number] Patient ID: April Ward, female   DOB: [birth year], 56 y.o.   MRN: [id number] Patient ID: April Ward, female   DOB: 1960/08/02, 56 y.o.   MRN: [id number] Patient ID: April Ward, female   DOB: 1961-01-15, 56 y.o.   MRN: [id number] Patient ID: April Ward, female   DOB: [birth year], 56 y.o.   MRN: [id number] Patient ID: April Ward, female   DOB: 04/08/1960, 56 y.o.   MRN: [id number] Patient ID: April Ward, female   DOB: [birth year], 56 y.o.   MRN: [id number] Patient ID: April Ward, female   DOB: 07-26-1960, 56 y.o.   MRN: [id number] Patient ID: April Ward, female   DOB: 11-29-1960, 56 y.o.   MRN: [id number] Patient ID: April Ward, female   DOB: [birth year], 56 y.o.   MRN: [id number] Patient ID: April Ward, female   DOB: 01/25/1961, 56 y.o.   MRN: [id number] Patient ID: April Ward, female   DOB: 02-20-1961, 56 y.o.   MRN: [id number]  Psychiatric Assessment Adult  Patient Identification:  April Ward Date of Evaluation:  04/10/2016 Chief Complaint: "I'm doing ok" History of Chief Complaint:   Chief Complaint  Patient presents with  . Depression  . Anxiety  . Follow-up    Depression         Associated symptoms include decreased concentration, fatigue, appetite change and suicidal ideas.  Past medical history includes anxiety.   Anxiety  Symptoms include decreased concentration, nervous/anxious behavior and suicidal ideas.     this patient is a 56 year old married black female who lives with her husband, and 11 year old son and 42 year-old nephew  In [place]. 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 The patient states that she had a cyst removed in her left kidney in December. After she had the surgery initially she was told everything was okay. She later found that 60% of her kidney was removed and that she had renal cancer. She's very upset because of the conflicting information. She also developed severe anemia and probably will have to on iron infusion. She now doesn't trust the doctors because she doesn't know what to believe. She is scheduled to see an oncologist to make sure she doesn't need any more treatment for cancer. Her nephrologist so has released her and states she needs to come back in 6 months.  The patient travels 3 hours to her job and stays there in an apartment. She's been doing this since June. Now however she doesn't feel able to function. She's been very depressed since the surgery and the news about the cancer. She's been crying all the time. Her thoughts are racing and she is unable to sleep. She's very tired and doesn't have any appetite. She's had passive suicidal ideation but no specific plan. She doesn't want to be around people because she gets so upset.  The patient had a depressive episode in May. At that time her  sisters were upsetting her. Her mother died in 07-27-11 and left her property to the patient and her 2 sisters. 2 sisters are not good about paying bills and the light bill is in her name. Her primary doctor had put her on  Vibryd which helped but her insurance did not cover it so she is no longer taking it. She's never been on any other antidepressants or had previous psychiatric treatment  The patient returns after 3 months. She was diagnosed with thyroid cancer and had a thyroidectomy in August followed by radioactive iodine treatment. She is now cancer free and on thyroid replacement. She gets frustrated with all her medical setbacks but for now she seems to be okay. She fell  recently was seen in the emergency room at [facility]. I looked at her labs and a potassium and calcium were both low as well as her T4. She is going to review this with her endocrinologist. Overall her mood is good and she is attending a "boot camp" for fitness and enjoying it. She's having trouble sleeping in the middle of the night but states she's been like this for years. She's no longer having nightmares or hallucinations and the Valium controls her anxiety Review of Systems  Constitutional: Positive for appetite change and fatigue.  Psychiatric/Behavioral: Positive for decreased concentration, depression, dysphoric mood, sleep disturbance and suicidal ideas. The patient is nervous/anxious.    Physical Exam not done Depressive Symptoms: depressed mood, anhedonia, insomnia, psychomotor retardation, fatigue, feelings of worthlessness/guilt, hopelessness, suicidal thoughts without plan, anxiety,  (Hypo) Manic Symptoms:   Elevated Mood:  No Irritable Mood:  No Grandiosity:  No Distractibility:  No Labiality of Mood:  No Delusions:  No Hallucinations:  No Impulsivity:  No Sexually Inappropriate Behavior:  No Financial Extravagance:  No Flight of Ideas:  No  Anxiety Symptoms: Excessive Worry:  Yes Panic Symptoms:  Yes Agoraphobia:  Yes Obsessive Compulsive: No  Symptoms: None, Specific Phobias:  No Social Anxiety:  Yes  Psychotic Symptoms:  Hallucinations: No None Delusions:  No Paranoia:  No   Ideas of Reference:  No  PTSD Symptoms: Ever had a traumatic exposure:  No Had a traumatic exposure in the last month:  No Re-experiencing: No None Hypervigilance:  No Hyperarousal: No None Avoidance: No None  Traumatic Brain Injury: No   Past Psychiatric History: Diagnosis: Maj. depression   Hospitalizations: None   Outpatient Care: None   Substance Abuse Care: None   Self-Mutilation: None   Suicidal Attempts: None   Violent Behaviors: None    Past Medical  History:   Past Medical History:  Diagnosis Date  . Anxiety   . Arthritis   . Depression   . Diabetes mellitus without complication ([place])   . GERD (gastroesophageal reflux disease)   . Gout   . Heart murmur   . Hypertension   . Renal cancer ([place])   . Renal disease 10/2012  . Renal insufficiency    History of Loss of Consciousness:  No Seizure History:  No Cardiac History:  No Allergies:   Allergies  Allergen Reactions  . Skelaxin [Metaxalone] Hives and Rash   Current Medications:  Current Outpatient Prescriptions  Medication Sig Dispense Refill  . amLODipine (NORVASC) 5 MG tablet Take 1 tablet (5 mg total) by mouth daily. 90 tablet 3  . buPROPion (WELLBUTRIN XL) 300 MG 24 hr tablet Take 1 tablet (300 mg total) by mouth every morning. 90 tablet 2  . calcium citrate-vitamin D 500-400 MG-UNIT chewable tablet  Chew 1 tablet by mouth 3 (three) times daily.    . carbamazepine (TEGRETOL) 200 MG tablet Take 2 tablets (400 mg total) by mouth daily. 180 tablet 2  . diazepam (VALIUM) 10 MG tablet Take 1 tablet (10 mg total) by mouth 4 (four) times daily. 120 tablet 2  . furosemide (LASIX) 40 MG tablet Take 40 mg by mouth as needed.    Marland Kitchen levothyroxine (SYNTHROID, LEVOTHROID) 200 MCG tablet Take 200 mcg by mouth daily before breakfast.    . losartan (COZAAR) 50 MG tablet Take 100 mg by mouth daily.     . meclizine (ANTIVERT) 25 MG tablet Take 1 tablet (25 mg total) by mouth every 8 (eight) hours as needed for dizziness. 90 tablet 0  . Multiple Vitamin (MULTIVITAMIN) tablet Take 1 tablet by mouth 3 (three) times daily.     Marland Kitchen nystatin (MYCOSTATIN/NYSTOP) powder as needed.    Marland Kitchen omeprazole (PRILOSEC) 20 MG capsule Take 20 mg by mouth daily.    . prazosin (MINIPRESS) 5 MG capsule Take 1 capsule (5 mg total) by mouth at bedtime. 90 capsule 2  . QUEtiapine (SEROQUEL) 50 MG tablet Take 1 tablet (50 mg total) by mouth at bedtime. 90 tablet 2  . vitamin B-12 (CYANOCOBALAMIN) 500 MCG tablet Take 500  mcg by mouth daily.     No current facility-administered medications for this visit.     Previous Psychotropic Medications:  Medication Dose   Xanax   0.5 mg 3 times a day   Vibryd  40 mg every morning                   Substance Abuse History in the last 12 months: Substance Age of 1st Use Last Use Amount Specific Type  Nicotine      Alcohol      Cannabis      Opiates      Cocaine      Methamphetamines      LSD      Ecstasy      Benzodiazepines      Caffeine      Inhalants      Others:                          Medical Consequences of Substance Abuse:n/a  Legal Consequences of Substance Abuse: n/a  Family Consequences of Substance Abuse: n/a  Blackouts:  No DT's:  No Withdrawal Symptoms:  No None  Social History: Current Place of Residence: [place] of Birth: [place] Family Members: Husband 2 children, one nephew 4 grandchildren Marital Status:  Married Children:   Sons: 1  Daughters: 1 Relationships:  Education:  Dentist Problems/Performance:  Religious Beliefs/Practices: Christian History of Abuse: none Occupational Experiences; 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 Legal History: None Hobbies/Interests: Traveling, visiting with grandchildren  Family History:   Family History  Problem Relation Age of Onset  . Heart attack Mother   . Heart attack Father   . Depression Paternal Aunt   . Alcohol abuse Maternal Uncle   . Colon cancer Maternal Aunt   . Colon cancer Maternal Uncle     Mental Status Examination/Evaluation: Objective:  Appearance: Casual and Fairly Groomed   Engineer, water::  Fair  Speech:  Clear and Coherent  Volume:  Normal  Mood: Fairly good   Affect:  Bright   Thought Process:  Intact  Orientation:  Full (Time, Place, and  Person)  Thought Content:  WDL  Suicidal Thoughts:no  Homicidal Thoughts:no  Judgement:  Intact  Insight:  Good   Psychomotor Activity:  Normal  Akathisia:  No  Handed:  Right  AIMS (if indicated):    Assets:  Communication Skills Desire for Improvement    Laboratory/X-Ray Psychological Evaluation(s)       Assessment:  Axis I: Generalized Anxiety Disorder and Major Depression, Recurrent severe  AXIS I Generalized Anxiety Disorder and Major Depression, Recurrent severe  AXIS II Deferred  AXIS III Past Medical History:  Diagnosis Date  . Anxiety   . Arthritis   . Depression   . Diabetes mellitus without complication ([place])   . GERD (gastroesophageal reflux disease)   . Gout   . Heart murmur   . Hypertension   . Renal cancer ([place])   . Renal disease 10/2012  . Renal insufficiency      AXIS IV other psychosocial or environmental problems  AXIS V 51-60 moderate symptoms   Treatment Plan/Recommendations:  Plan of Care: Medication management   Laboratory:   Psychotherapy:    Medications: She'll continue Wellbutrin  300 mg XL daily, continue Tegretol and Seroquel for mood stabilization and Valium for anxiety and prazosin 5 mg at bedtime for nightmares   Routine PRN Medications:  No  Consultations:   Safety Concerns:  She contracts for safety   Other:    return in  3 months     [name], MD 1/9/20183:35 PM

## 2016-04-11 ENCOUNTER — Ambulatory Visit (HOSPITAL_COMMUNITY): Payer: Self-pay | Admitting: Psychiatry

## 2016-04-13 ENCOUNTER — Encounter: Payer: Self-pay | Admitting: Cardiology

## 2016-04-13 ENCOUNTER — Ambulatory Visit (INDEPENDENT_AMBULATORY_CARE_PROVIDER_SITE_OTHER): Payer: Self-pay | Admitting: Cardiology

## 2016-04-13 VITALS — BP 143/87 | HR 74 | Ht 71.0 in | Wt 220.0 lb

## 2016-04-13 DIAGNOSIS — I951 Orthostatic hypotension: Secondary | ICD-10-CM

## 2016-04-13 DIAGNOSIS — R55 Syncope and collapse: Secondary | ICD-10-CM

## 2016-04-13 NOTE — Patient Instructions (Signed)
Your physician recommends that you schedule a follow-up appointment in: [address] [place]   Your physician recommends that you continue on your current medications as directed. Please refer to the Current Medication list given to you today.  Your physician has requested that you regularly monitor and record your blood pressure readings at home [address] [place]. Please use the same machine at the same time of day to check your readings and record them to bring to your follow-up visit.  NURSE VISIT FOR 1 WEEK TO COMPARE BLOOD PRESSURE MACHINE   Thank you for choosing [facility]!!

## 2016-04-13 NOTE — Progress Notes (Signed)
Clinical Summary April Ward is a 56 y.o.female  seen today for follow up of the following medical problems. This is a focused visit on recent episode of syncope.    1. Syncope - episode on Monday while at home in kitchen at [place] dizzy. Felt dizzy. She reports heart rate was around 48. Fell to floor. LOC for just a few seconds. No other episodes - had been feeling well prior.  - Poor oral intake that Sunday.  - Cr 3.10, K 3.3. Hgb 10.9, MCV 73,      Past Medical History:  Diagnosis Date  . Anxiety   . Arthritis   . Depression   . Diabetes mellitus without complication ([place])   . GERD (gastroesophageal reflux disease)   . Gout   . Heart murmur   . Hypertension   . Renal cancer ([place])   . Renal disease 10/2012  . Renal insufficiency      Allergies  Allergen Reactions  . Skelaxin [Metaxalone] Hives and Rash     Current Outpatient Prescriptions  Medication Sig Dispense Refill  . amLODipine (NORVASC) 5 MG tablet Take 1 tablet (5 mg total) by mouth daily. 90 tablet 3  . buPROPion (WELLBUTRIN XL) 300 MG 24 hr tablet Take 1 tablet (300 mg total) by mouth every morning. 90 tablet 2  . calcium citrate-vitamin D 500-400 MG-UNIT chewable tablet Chew 1 tablet by mouth 3 (three) times daily.    . carbamazepine (TEGRETOL) 200 MG tablet Take 2 tablets (400 mg total) by mouth daily. 180 tablet 2  . diazepam (VALIUM) 10 MG tablet Take 1 tablet (10 mg total) by mouth 4 (four) times daily. 120 tablet 2  . furosemide (LASIX) 40 MG tablet Take 40 mg by mouth as needed.    Marland Kitchen levothyroxine (SYNTHROID, LEVOTHROID) 200 MCG tablet Take 200 mcg by mouth daily before breakfast.    . losartan (COZAAR) 50 MG tablet Take 100 mg by mouth daily.     . meclizine (ANTIVERT) 25 MG tablet Take 1 tablet (25 mg total) by mouth every 8 (eight) hours as needed for dizziness. 90 tablet 0  . Multiple Vitamin (MULTIVITAMIN) tablet Take 1 tablet by mouth 3 (three) times daily.     Marland Kitchen nystatin  (MYCOSTATIN/NYSTOP) powder as needed.    Marland Kitchen omeprazole (PRILOSEC) 20 MG capsule Take 20 mg by mouth daily.    . prazosin (MINIPRESS) 5 MG capsule Take 1 capsule (5 mg total) by mouth at bedtime. 90 capsule 2  . QUEtiapine (SEROQUEL) 50 MG tablet Take 1 tablet (50 mg total) by mouth at bedtime. 90 tablet 2  . vitamin B-12 (CYANOCOBALAMIN) 500 MCG tablet Take 500 mcg by mouth daily.     No current facility-administered medications for this visit.      Past Surgical History:  Procedure Laterality Date  . CESAREAN SECTION  [id number]  . CHONDROPLASTY Right 08/22/2012   Procedure: CHONDROPLASTY;  Surgeon: Carole Civil, MD;  Location: [facility];  Service: Orthopedics;  Laterality: Right;  . COLONOSCOPY WITH PROPOFOL N/A 03/16/2014   Procedure: COLONOSCOPY WITH PROPOFOL (at cecum 1023, total withdrawal time=9 minutes);  Surgeon: Danie Binder, MD;  Location: [facility];  Service: Endoscopy;  Laterality: N/A;  . GASTRIC BYPASS    . KNEE ARTHROSCOPY WITH MEDIAL MENISECTOMY Right 08/22/2012   Procedure: KNEE ARTHROSCOPY WITH PARTIAL MEDIAL MENISECTOMY;  Surgeon: Carole Civil, MD;  Location: [facility];  Service: Orthopedics;  Laterality: Right;  . PARTIAL NEPHRECTOMY  Dec  2014   left  . TENDON REPAIR       Allergies  Allergen Reactions  . Skelaxin [Metaxalone] Hives and Rash      Family History  Problem Relation Age of Onset  . Heart attack Mother   . Heart attack Father   . Depression Paternal Aunt   . Alcohol abuse Maternal Uncle   . Colon cancer Maternal Aunt   . Colon cancer Maternal Uncle      Social History April Ward reports that she has never smoked. She has never used smokeless tobacco. April Ward reports that she does not drink alcohol.   Review of Systems CONSTITUTIONAL: No weight loss, fever, chills, weakness or fatigue.  HEENT: Eyes: No visual loss, blurred vision, double vision or yellow sclerae.No hearing loss, sneezing, congestion, runny nose or sore throat.  SKIN:  No rash or itching.  CARDIOVASCULAR: no chest pain, no palpitations RESPIRATORY: No shortness of breath, cough or sputum.  GASTROINTESTINAL: No anorexia, nausea, vomiting or diarrhea. No abdominal pain or blood.  GENITOURINARY: No burning on urination, no polyuria NEUROLOGICAL: +dizziness MUSCULOSKELETAL: No muscle, back pain, joint pain or stiffness.  LYMPHATICS: No enlarged nodes. No history of splenectomy.  PSYCHIATRIC: No history of depression or anxiety.  ENDOCRINOLOGIC: No reports of sweating, cold or heat intolerance. No polyuria or polydipsia.  Marland Kitchen   Physical Examination Vitals:   04/13/16 1621 04/13/16 1625  BP: (!) 143/82 (!) 143/87  Pulse: 69 74   Vitals:   04/13/16 1612  Weight: 220 lb (99.8 kg)  Height: 5\' 11"  (1.803 m)    Gen: resting comfortably, no acute distress HEENT: no scleral icterus, pupils equal round and reactive, no palptable cervical adenopathy,  CV: RRR, no m/r/g, no jvd Resp: Clear to auscultation bilaterally GI: abdomen is soft, non-tender, non-distended, normal bowel sounds, no hepatosplenomegaly MSK: extremities are warm, no edema.  Skin: warm, no rash Neuro:  no focal deficits Psych: appropriate affect   Diagnostic Studies 09/2013 Carotid US 1-39% bilateral ICA disease  09/2013 Exercise MPI Overall low risk exercise/Lexiscan Cardiolite. Patient had limited exercise capacity achieving maximum work load of 7 METS, limited by shortness of breath and hypertensive response. There were no clearly diagnostic ST segment abnormalities. Occasional to frequent PVCs and ventricular couplets were noted early in exercise. There were no sustained arrhythmias. Perfusion imaging shows probable variable soft tissue attenuation, less likely a minor degree of basal lateral ischemia. LVEF is normal at 61% with upper normal chamber volume and no obvious wall motion abnormalities.    Assessment and Plan  1.Syncope - probable orthostatic syncope. SBP drops  18 ponts today in lcinic with standing, diastolic drops 11 points.  - encouraged increased oral intake - if recurrent episode, consider home event monitor      Arnoldo Lenis, M.D.

## 2016-04-30 DIAGNOSIS — I10 Essential (primary) hypertension: Secondary | ICD-10-CM | POA: Diagnosis not present

## 2016-04-30 DIAGNOSIS — E1122 Type 2 diabetes mellitus with diabetic chronic kidney disease: Secondary | ICD-10-CM | POA: Diagnosis not present

## 2016-05-04 DIAGNOSIS — H25813 Combined forms of age-related cataract, bilateral: Secondary | ICD-10-CM | POA: Diagnosis not present

## 2016-05-04 DIAGNOSIS — H02831 Dermatochalasis of right upper eyelid: Secondary | ICD-10-CM | POA: Diagnosis not present

## 2016-05-04 DIAGNOSIS — H43811 Vitreous degeneration, right eye: Secondary | ICD-10-CM | POA: Diagnosis not present

## 2016-05-04 DIAGNOSIS — H53001 Unspecified amblyopia, right eye: Secondary | ICD-10-CM | POA: Diagnosis not present

## 2016-05-10 DIAGNOSIS — H2511 Age-related nuclear cataract, right eye: Secondary | ICD-10-CM | POA: Diagnosis not present

## 2016-05-10 DIAGNOSIS — H25811 Combined forms of age-related cataract, right eye: Secondary | ICD-10-CM | POA: Diagnosis not present

## 2016-05-17 ENCOUNTER — Other Ambulatory Visit (HOSPITAL_COMMUNITY): Payer: Self-pay | Admitting: Psychiatry

## 2016-05-17 DIAGNOSIS — H2512 Age-related nuclear cataract, left eye: Secondary | ICD-10-CM | POA: Diagnosis not present

## 2016-05-17 DIAGNOSIS — H25812 Combined forms of age-related cataract, left eye: Secondary | ICD-10-CM | POA: Diagnosis not present

## 2016-05-21 ENCOUNTER — Other Ambulatory Visit (HOSPITAL_COMMUNITY): Payer: Self-pay | Admitting: Psychiatry

## 2016-05-25 ENCOUNTER — Encounter: Payer: Self-pay | Admitting: Cardiology

## 2016-05-25 ENCOUNTER — Ambulatory Visit: Payer: Self-pay | Admitting: Cardiology

## 2016-05-25 NOTE — Progress Notes (Deleted)
Clinical Summary Ms. Rathmann is a 56 y.o.female  1. Syncope - episode on Monday while at home in kitchen at [place] dizzy. Felt dizzy. She reports heart rate was around 48. Fell to floor. LOC for just a few seconds. No other episodes - had been feeling well prior.  - Poor oral intake that Sunday.  - Cr 3.10, K 3.3. Hgb 10.9, MCV 73,     2. Obesity - had gastric sleeve procedure done, significant weight loss since that time.   3. CKD - followed Dr Kris Mouton at [facility].  - July 2016 Cr 2.62, BUN 40  4. Dizziness - can occur with lying, sitting or standing. Can occur with changing positions of her head. Often has sinus congestion, sinus pressure    Past Medical History:  Diagnosis Date  . Anxiety   . Arthritis   . Depression   . Diabetes mellitus without complication ([place])   . GERD (gastroesophageal reflux disease)   . Gout   . Heart murmur   . Hypertension   . Renal cancer ([place])   . Renal disease 10/2012  . Renal insufficiency      Allergies  Allergen Reactions  . Skelaxin [Metaxalone] Hives and Rash     Current Outpatient Prescriptions  Medication Sig Dispense Refill  . amLODipine (NORVASC) 5 MG tablet Take 1 tablet (5 mg total) by mouth daily. 90 tablet 3  . buPROPion (WELLBUTRIN XL) 300 MG 24 hr tablet Take 1 tablet (300 mg total) by mouth every morning. 90 tablet 2  . calcium citrate-vitamin D 500-400 MG-UNIT chewable tablet Chew 1 tablet by mouth 3 (three) times daily.    . carbamazepine (TEGRETOL) 200 MG tablet Take 2 tablets (400 mg total) by mouth daily. 180 tablet 2  . diazepam (VALIUM) 10 MG tablet Take 1 tablet (10 mg total) by mouth 4 (four) times daily. 120 tablet 2  . furosemide (LASIX) 40 MG tablet Take 40 mg by mouth as needed.    Marland Kitchen levothyroxine (SYNTHROID, LEVOTHROID) 200 MCG tablet Take 200 mcg by mouth daily before breakfast.    . losartan (COZAAR) 50 MG tablet Take 100 mg by mouth daily.     . meclizine (ANTIVERT) 25 MG tablet Take 1  tablet (25 mg total) by mouth every 8 (eight) hours as needed for dizziness. 90 tablet 0  . Multiple Vitamin (MULTIVITAMIN) tablet Take 1 tablet by mouth 3 (three) times daily.     Marland Kitchen nystatin (MYCOSTATIN/NYSTOP) powder as needed.    Marland Kitchen omeprazole (PRILOSEC) 20 MG capsule Take 20 mg by mouth daily.    . prazosin (MINIPRESS) 5 MG capsule Take 1 capsule (5 mg total) by mouth at bedtime. 90 capsule 2  . QUEtiapine (SEROQUEL) 50 MG tablet Take 1 tablet (50 mg total) by mouth at bedtime. 90 tablet 2  . vitamin B-12 (CYANOCOBALAMIN) 500 MCG tablet Take 500 mcg by mouth daily.     No current facility-administered medications for this visit.      Past Surgical History:  Procedure Laterality Date  . CESAREAN SECTION  [id number]  . CHONDROPLASTY Right 08/22/2012   Procedure: CHONDROPLASTY;  Surgeon: Carole Civil, MD;  Location: [facility];  Service: Orthopedics;  Laterality: Right;  . COLONOSCOPY WITH PROPOFOL N/A 03/16/2014   Procedure: COLONOSCOPY WITH PROPOFOL (at cecum 1023, total withdrawal time=9 minutes);  Surgeon: Danie Binder, MD;  Location: [facility];  Service: Endoscopy;  Laterality: N/A;  . GASTRIC BYPASS    . KNEE ARTHROSCOPY  WITH MEDIAL MENISECTOMY Right 08/22/2012   Procedure: KNEE ARTHROSCOPY WITH PARTIAL MEDIAL MENISECTOMY;  Surgeon: Carole Civil, MD;  Location: [facility];  Service: Orthopedics;  Laterality: Right;  . PARTIAL NEPHRECTOMY  Dec 2014   left  . TENDON REPAIR       Allergies  Allergen Reactions  . Skelaxin [Metaxalone] Hives and Rash      Family History  Problem Relation Age of Onset  . Heart attack Mother   . Heart attack Father   . Depression Paternal Aunt   . Alcohol abuse Maternal Uncle   . Colon cancer Maternal Aunt   . Colon cancer Maternal Uncle      Social History Ms. Okland reports that she has never smoked. She has never used smokeless tobacco. Ms. Kusak reports that she does not drink alcohol.   Review of Systems CONSTITUTIONAL: No weight  loss, fever, chills, weakness or fatigue.  HEENT: Eyes: No visual loss, blurred vision, double vision or yellow sclerae.No hearing loss, sneezing, congestion, runny nose or sore throat.  SKIN: No rash or itching.  CARDIOVASCULAR:  RESPIRATORY: No shortness of breath, cough or sputum.  GASTROINTESTINAL: No anorexia, nausea, vomiting or diarrhea. No abdominal pain or blood.  GENITOURINARY: No burning on urination, no polyuria NEUROLOGICAL: No headache, dizziness, syncope, paralysis, ataxia, numbness or tingling in the extremities. No change in bowel or bladder control.  MUSCULOSKELETAL: No muscle, back pain, joint pain or stiffness.  LYMPHATICS: No enlarged nodes. No history of splenectomy.  PSYCHIATRIC: No history of depression or anxiety.  ENDOCRINOLOGIC: No reports of sweating, cold or heat intolerance. No polyuria or polydipsia.  Marland Kitchen   Physical Examination There were no vitals filed for this visit. There were no vitals filed for this visit.  Gen: resting comfortably, no acute distress HEENT: no scleral icterus, pupils equal round and reactive, no palptable cervical adenopathy,  CV Resp: Clear to auscultation bilaterally GI: abdomen is soft, non-tender, non-distended, normal bowel sounds, no hepatosplenomegaly MSK: extremities are warm, no edema.  Skin: warm, no rash Neuro:  no focal deficits Psych: appropriate affect   Diagnostic Studies 09/2013 Carotid US 1-39% bilateral ICA disease  09/2013 Exercise MPI Overall low risk exercise/Lexiscan Cardiolite. Patient had limited exercise capacity achieving maximum work load of 7 METS, limited by shortness of breath and hypertensive response. There were no clearly diagnostic ST segment abnormalities. Occasional to frequent PVCs and ventricular couplets were noted early in exercise. There were no sustained arrhythmias. Perfusion imaging shows probable variable soft tissue attenuation, less likely a minor degree of basal  lateral ischemia. LVEF is normal at 61% with upper normal chamber volume and no obvious wall motion abnormalities.    Assessment and Plan  1.Syncope - probable orthostatic syncope. SBP drops 18 ponts today in lcinic with standing, diastolic drops 11 points.  - encouraged increased oral intake - if recurrent episode, consider home event monitor   1. HTN  - negative workup for secondary HTN. BP significantly improved since gastric sleeve procedure and significant weight loss - bp elevated in clinic but home numbers at goal, will conitnue current meds  2. Dizziness - orthostatics negative in clinic. Symptoms suggestive of vertigo, will prescribe prn meclizine.    Arnoldo Lenis, M.D., F.A.C.C.

## 2016-05-30 DIAGNOSIS — H43811 Vitreous degeneration, right eye: Secondary | ICD-10-CM | POA: Diagnosis not present

## 2016-06-07 ENCOUNTER — Telehealth (HOSPITAL_COMMUNITY): Payer: Self-pay | Admitting: *Deleted

## 2016-06-07 MED ORDER — QUETIAPINE FUMARATE 50 MG PO TABS: 50.0000 mg | ORAL_TABLET | Freq: Every day | ORAL | 1 refills | Status: DC

## 2016-06-07 NOTE — Telephone Encounter (Signed)
Per Dr. Harrington Challenger to call in 61 supply of pt Seroquel due insurance request she get it filled for 90 days supply.  Called pharmacy and spoke with Shonna Chock and RMA informed with what provider stated and she verified understanding. Elmyra Ricks then pass call to [facility] the pharmacist to verify the refill that's being called in. 90 tabs 1 refill. Called pt and informed her refill was called in and pt is aware.

## 2016-06-07 NOTE — Telephone Encounter (Signed)
Request from [place] for refills for pt Quetiapine 50 mg and Prazosin. Per pt chart, her Prazosin is not on her current med list but Seroquel is. Per pt chart, pt medication was last sent to [facility] on 04-10-2016. Called pt to verify her pharmacy and she stated her insurance is making her fill her medications at [facility]. Pt pharmacy fax number is [phone].

## 2016-06-07 NOTE — Telephone Encounter (Signed)
Medication called into pharmacy. Spoke with [place] and nicole

## 2016-06-07 NOTE — Telephone Encounter (Signed)
Please send 90 day supply of seroquel to [facility]

## 2016-07-05 ENCOUNTER — Encounter (HOSPITAL_COMMUNITY): Payer: Self-pay | Admitting: Psychiatry

## 2016-07-05 ENCOUNTER — Ambulatory Visit (INDEPENDENT_AMBULATORY_CARE_PROVIDER_SITE_OTHER): Payer: BLUE CROSS/BLUE SHIELD | Admitting: Psychiatry

## 2016-07-05 VITALS — BP 130/88 | HR 60 | Ht 71.0 in | Wt 217.0 lb

## 2016-07-05 DIAGNOSIS — Z811 Family history of alcohol abuse and dependence: Secondary | ICD-10-CM

## 2016-07-05 DIAGNOSIS — F411 Generalized anxiety disorder: Secondary | ICD-10-CM

## 2016-07-05 DIAGNOSIS — Z818 Family history of other mental and behavioral disorders: Secondary | ICD-10-CM | POA: Diagnosis not present

## 2016-07-05 DIAGNOSIS — F322 Major depressive disorder, single episode, severe without psychotic features: Secondary | ICD-10-CM

## 2016-07-05 DIAGNOSIS — Z79899 Other long term (current) drug therapy: Secondary | ICD-10-CM | POA: Diagnosis not present

## 2016-07-05 MED ORDER — PRAZOSIN HCL 5 MG PO CAPS: 5.0000 mg | ORAL_CAPSULE | Freq: Every day | ORAL | 2 refills | Status: DC

## 2016-07-05 MED ORDER — BUPROPION HCL ER (XL) 300 MG PO TB24: 300.0000 mg | ORAL_TABLET | ORAL | 2 refills | Status: DC

## 2016-07-05 MED ORDER — DIAZEPAM 10 MG PO TABS: 10.0000 mg | ORAL_TABLET | Freq: Four times a day (QID) | ORAL | 2 refills | Status: DC

## 2016-07-05 MED ORDER — QUETIAPINE FUMARATE 100 MG PO TABS: 100.0000 mg | ORAL_TABLET | Freq: Every day | ORAL | 2 refills | Status: DC

## 2016-07-05 MED ORDER — CARBAMAZEPINE 200 MG PO TABS: 400.0000 mg | ORAL_TABLET | Freq: Every day | ORAL | 2 refills | Status: DC

## 2016-07-05 NOTE — Progress Notes (Signed)
Patient ID: SONYIA MURO, female   DOB: 08/12/1960, 56 y.o.   MRN: [id number] Patient ID: SUHAYLA CHISOM, female   DOB: 01-Jun-1960, 56 y.o.   MRN: [id number] Patient ID: ANTONISHA WASKEY, female   DOB: 1960-11-26, 56 y.o.   MRN: [id number] Patient ID: SAGA BALTHAZAR, female   DOB: 1960/07/31, 56 y.o.   MRN: [id number] Patient ID: CHEQUITA MOFIELD, female   DOB: 1960-04-09, 56 y.o.   MRN: [id number] Patient ID: OVAL MORALEZ, female   DOB: 06/18/1960, 56 y.o.   MRN: [id number] Patient ID: MAILI SHUTTERS, female   DOB: [birth year], 56 y.o.   MRN: [id number] Patient ID: MARYROSE COLVIN, female   DOB: 08/31/1960, 56 y.o.   MRN: [id number] Patient ID: AZULA ZAPPIA, female   DOB: 1961-01-31, 56 y.o.   MRN: [id number] Patient ID: JOYCELYNN FRITSCHE, female   DOB: Dec 29, 1960, 56 y.o.   MRN: [id number] Patient ID: WENONAH MILO, female   DOB: 28-Sep-1960, 56 y.o.   MRN: [id number] Patient ID: MELAYA HOSELTON, female   DOB: Jun 07, 1960, 56 y.o.   MRN: [id number] Patient ID: ASLAN MONTAGNA, female   DOB: 1960/10/30, 57 y.o.   MRN: [id number] Patient ID: DUNIA PRINGLE, female   DOB: Apr 13, 1960, 56 y.o.   MRN: [id number] Patient ID: ALANIE SYLER, female   DOB: 01-16-1961, 56 y.o.   MRN: [id number]  Psychiatric Assessment Adult  Patient Identification:  NIKEIA HENKES Date of Evaluation:  07/05/2016 Chief Complaint: "I'm doing ok" History of Chief Complaint:   Chief Complaint  Patient presents with  . Depression  . Anxiety  . Follow-up    Depression         Associated symptoms include decreased concentration, fatigue, appetite change and suicidal ideas.  Past medical history includes anxiety.   Anxiety  Symptoms include decreased concentration, nervous/anxious behavior and suicidal ideas.     this patient is a 56 year old married black female who lives with her husband, and 23 year old son and 42 year-old nephew  In [place]. 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 The patient states that she had a cyst removed in her left kidney in December. After she had the surgery initially she was told everything was okay. She later found that 60% of her kidney was removed and that she had renal cancer. She's very upset because of the conflicting information. She also developed severe anemia and probably will have to on iron infusion. She now doesn't trust the doctors because she doesn't know what to believe. She is scheduled to see an oncologist to make sure she doesn't need any more treatment for cancer. Her nephrologist so has released her and states she needs to come back in 6 months.  The patient travels 3 hours to her job and stays there in an apartment. She's been doing this since June. Now however she doesn't feel able to function. She's been very depressed since the surgery and the news about the cancer. She's been crying all the time. Her thoughts are racing and she is unable to sleep. She's very tired and doesn't have any appetite. She's had passive suicidal ideation but no specific plan. She doesn't want to be around people because she gets so upset.  The patient had a depressive episode in May. At that time her  sisters were upsetting her. Her mother died in 07-23-2011 and left her property to the patient and her 2 sisters. 2 sisters are not good about paying bills and the light bill is in her name. Her primary doctor had put her on  Vibryd which helped but her insurance did not cover it so she is no longer taking it. She's never been on any other antidepressants or had previous psychiatric treatment  The patient returns after 3 months. She is not doing well. Her husband died suddenly of a heart attack on March 24 while he was driving. He could not be revived. She is totally in shock. She's not sleeping well. She does have good support from other family members friends and his family as  well as the church. She looks sad today somewhat tearful but mostly just blunted. She is trying to take care of herself the best she can but is having difficulty eating and sleeping. Her insurance also has not been giving her her Wellbutrin and we are going to try to get this straightened out. I also suggested an increase in Seroquel which may help with sleep and appetite Review of Systems  Constitutional: Positive for appetite change and fatigue.  Psychiatric/Behavioral: Positive for decreased concentration, depression, dysphoric mood, sleep disturbance and suicidal ideas. The patient is nervous/anxious.    Physical Exam not done Depressive Symptoms: depressed mood, anhedonia, insomnia, psychomotor retardation, fatigue, feelings of worthlessness/guilt, hopelessness, suicidal thoughts without plan, anxiety,  (Hypo) Manic Symptoms:   Elevated Mood:  No Irritable Mood:  No Grandiosity:  No Distractibility:  No Labiality of Mood:  No Delusions:  No Hallucinations:  No Impulsivity:  No Sexually Inappropriate Behavior:  No Financial Extravagance:  No Flight of Ideas:  No  Anxiety Symptoms: Excessive Worry:  Yes Panic Symptoms:  Yes Agoraphobia:  Yes Obsessive Compulsive: No  Symptoms: None, Specific Phobias:  No Social Anxiety:  Yes  Psychotic Symptoms:  Hallucinations: No None Delusions:  No Paranoia:  No   Ideas of Reference:  No  PTSD Symptoms: Ever had a traumatic exposure:  No Had a traumatic exposure in the last month:  No Re-experiencing: No None Hypervigilance:  No Hyperarousal: No None Avoidance: No None  Traumatic Brain Injury: No   Past Psychiatric History: Diagnosis: Maj. depression   Hospitalizations: None   Outpatient Care: None   Substance Abuse Care: None   Self-Mutilation: None   Suicidal Attempts: None   Violent Behaviors: None    Past Medical History:   Past Medical History:  Diagnosis Date  . Anxiety   . Arthritis   . Depression   .  Diabetes mellitus without complication ([place])   . GERD (gastroesophageal reflux disease)   . Gout   . Heart murmur   . Hypertension   . Renal cancer ([place])   . Renal disease 10/2012  . Renal insufficiency    History of Loss of Consciousness:  No Seizure History:  No Cardiac History:  No Allergies:   Allergies  Allergen Reactions  . Skelaxin [Metaxalone] Hives and Rash   Current Medications:  Current Outpatient Prescriptions  Medication Sig Dispense Refill  . amLODipine (NORVASC) 5 MG tablet Take 1 tablet (5 mg total) by mouth daily. 90 tablet 3  . calcium citrate-vitamin D 500-400 MG-UNIT chewable tablet Chew 1 tablet by mouth 3 (three) times daily.    . carbamazepine (TEGRETOL) 200 MG tablet Take 2 tablets (400 mg total) by mouth daily. 180 tablet 2  . diazepam (VALIUM) 10 MG  tablet Take 1 tablet (10 mg total) by mouth 4 (four) times daily. 120 tablet 2  . furosemide (LASIX) 40 MG tablet Take 40 mg by mouth as needed.    Marland Kitchen levothyroxine (SYNTHROID, LEVOTHROID) 200 MCG tablet Take 200 mcg by mouth daily before breakfast.    . losartan (COZAAR) 50 MG tablet Take 100 mg by mouth daily.     . meclizine (ANTIVERT) 25 MG tablet Take 1 tablet (25 mg total) by mouth every 8 (eight) hours as needed for dizziness. 90 tablet 0  . Multiple Vitamin (MULTIVITAMIN) tablet Take 1 tablet by mouth 3 (three) times daily.     Marland Kitchen nystatin (MYCOSTATIN/NYSTOP) powder as needed.    Marland Kitchen omeprazole (PRILOSEC) 20 MG capsule Take 20 mg by mouth daily.    . prazosin (MINIPRESS) 5 MG capsule Take 1 capsule (5 mg total) by mouth at bedtime. 90 capsule 2  . vitamin B-12 (CYANOCOBALAMIN) 500 MCG tablet Take 500 mcg by mouth daily.    Marland Kitchen buPROPion (WELLBUTRIN XL) 300 MG 24 hr tablet Take 1 tablet (300 mg total) by mouth every morning. 90 tablet 2  . QUEtiapine (SEROQUEL) 100 MG tablet Take 1 tablet (100 mg total) by mouth at bedtime. 90 tablet 2   No current facility-administered medications for this visit.      Previous Psychotropic Medications:  Medication Dose   Xanax   0.5 mg 3 times a day   Vibryd  40 mg every morning                   Substance Abuse History in the last 12 months: Substance Age of 1st Use Last Use Amount Specific Type  Nicotine      Alcohol      Cannabis      Opiates      Cocaine      Methamphetamines      LSD      Ecstasy      Benzodiazepines      Caffeine      Inhalants      Others:                          Medical Consequences of Substance Abuse:n/a  Legal Consequences of Substance Abuse: n/a  Family Consequences of Substance Abuse: n/a  Blackouts:  No DT's:  No Withdrawal Symptoms:  No None  Social History: Current Place of Residence: [place] of Birth: [place] Family Members: Husband 2 children, one nephew 4 grandchildren Marital Status:  Married Children:   Sons: 1  Daughters: 1 Relationships:  Education:  Dentist Problems/Performance:  Religious Beliefs/Practices: Christian History of Abuse: none Occupational Experiences; 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 Legal History: None Hobbies/Interests: Traveling, visiting with grandchildren  Family History:   Family History  Problem Relation Age of Onset  . Heart attack Mother   . Heart attack Father   . Depression Paternal Aunt   . Alcohol abuse Maternal Uncle   . Colon cancer Maternal Aunt   . Colon cancer Maternal Uncle     Mental Status Examination/Evaluation: Objective:  Appearance: Casual and Fairly Groomed   Engineer, water::  Fair  Speech:  Clear and Coherent  Volume:  Normal  Mood: Depressed and sad   Affect: Constricted and tearful  Thought Process:  Intact  Orientation:  Full (Time, Place, and Person)  Thought Content:  WDL  Suicidal Thoughts:no  Homicidal Thoughts:no  Judgement:  Intact  Insight:  Good  Psychomotor Activity:  Normal  Akathisia:  No  Handed:  Right  AIMS (if  indicated):    Assets:  Communication Skills Desire for Improvement    Laboratory/X-Ray Psychological Evaluation(s)       Assessment:  Axis I: Generalized Anxiety Disorder and Major Depression, Recurrent severe  AXIS I Generalized Anxiety Disorder and Major Depression, Recurrent severe  AXIS II Deferred  AXIS III Past Medical History:  Diagnosis Date  . Anxiety   . Arthritis   . Depression   . Diabetes mellitus without complication ([place])   . GERD (gastroesophageal reflux disease)   . Gout   . Heart murmur   . Hypertension   . Renal cancer ([place])   . Renal disease 10/2012  . Renal insufficiency      AXIS IV other psychosocial or environmental problems  AXIS V 51-60 moderate symptoms   Treatment Plan/Recommendations:  Plan of Care: Medication management   Laboratory:   Psychotherapy:She'll restart her therapy with Peggy Bynum    Medications: She'll continue Wellbutrin  300 mg XL daily, continue Tegretol and Seroquel for mood stabilization and Valium for anxiety and prazosin 5 mg at bedtime for nightmares. She'll increase Seroquel from 50-100 mg at bedtime   Routine PRN Medications:  No  Consultations:   Safety Concerns:  She contracts for safety   Other:    return in 4 weeks     [name], MD 4/5/20183:21 PM

## 2016-07-12 ENCOUNTER — Ambulatory Visit (HOSPITAL_COMMUNITY): Payer: BLUE CROSS/BLUE SHIELD | Admitting: Psychiatry

## 2016-07-16 DIAGNOSIS — E46 Unspecified protein-calorie malnutrition: Secondary | ICD-10-CM | POA: Diagnosis not present

## 2016-07-16 DIAGNOSIS — Z9884 Bariatric surgery status: Secondary | ICD-10-CM | POA: Diagnosis not present

## 2016-07-23 DIAGNOSIS — E669 Obesity, unspecified: Secondary | ICD-10-CM | POA: Diagnosis not present

## 2016-07-23 DIAGNOSIS — Z6831 Body mass index (BMI) 31.0-31.9, adult: Secondary | ICD-10-CM | POA: Diagnosis not present

## 2016-07-23 DIAGNOSIS — R948 Abnormal results of function studies of other organs and systems: Secondary | ICD-10-CM | POA: Diagnosis not present

## 2016-07-24 DIAGNOSIS — Z6831 Body mass index (BMI) 31.0-31.9, adult: Secondary | ICD-10-CM | POA: Diagnosis not present

## 2016-07-24 DIAGNOSIS — N184 Chronic kidney disease, stage 4 (severe): Secondary | ICD-10-CM | POA: Diagnosis not present

## 2016-07-24 DIAGNOSIS — I129 Hypertensive chronic kidney disease with stage 1 through stage 4 chronic kidney disease, or unspecified chronic kidney disease: Secondary | ICD-10-CM | POA: Diagnosis not present

## 2016-07-24 DIAGNOSIS — Z903 Acquired absence of stomach [part of]: Secondary | ICD-10-CM | POA: Diagnosis not present

## 2016-07-27 DIAGNOSIS — N184 Chronic kidney disease, stage 4 (severe): Secondary | ICD-10-CM | POA: Diagnosis not present

## 2016-07-27 DIAGNOSIS — D631 Anemia in chronic kidney disease: Secondary | ICD-10-CM | POA: Diagnosis not present

## 2016-07-27 DIAGNOSIS — C73 Malignant neoplasm of thyroid gland: Secondary | ICD-10-CM | POA: Diagnosis not present

## 2016-07-27 DIAGNOSIS — N2889 Other specified disorders of kidney and ureter: Secondary | ICD-10-CM | POA: Diagnosis not present

## 2016-07-27 DIAGNOSIS — E1122 Type 2 diabetes mellitus with diabetic chronic kidney disease: Secondary | ICD-10-CM | POA: Diagnosis not present

## 2016-07-27 DIAGNOSIS — R809 Proteinuria, unspecified: Secondary | ICD-10-CM | POA: Diagnosis not present

## 2016-07-27 DIAGNOSIS — I129 Hypertensive chronic kidney disease with stage 1 through stage 4 chronic kidney disease, or unspecified chronic kidney disease: Secondary | ICD-10-CM | POA: Diagnosis not present

## 2016-07-27 DIAGNOSIS — R634 Abnormal weight loss: Secondary | ICD-10-CM | POA: Diagnosis not present

## 2016-07-27 DIAGNOSIS — N051 Unspecified nephritic syndrome with focal and segmental glomerular lesions: Secondary | ICD-10-CM | POA: Diagnosis not present

## 2016-08-02 ENCOUNTER — Encounter (HOSPITAL_COMMUNITY): Payer: Self-pay | Admitting: Psychiatry

## 2016-08-02 ENCOUNTER — Ambulatory Visit (INDEPENDENT_AMBULATORY_CARE_PROVIDER_SITE_OTHER): Payer: Medicare PPO | Admitting: Psychiatry

## 2016-08-02 VITALS — BP 160/86 | HR 58 | Ht 71.0 in | Wt 226.0 lb

## 2016-08-02 DIAGNOSIS — Z811 Family history of alcohol abuse and dependence: Secondary | ICD-10-CM

## 2016-08-02 DIAGNOSIS — F322 Major depressive disorder, single episode, severe without psychotic features: Secondary | ICD-10-CM | POA: Diagnosis not present

## 2016-08-02 DIAGNOSIS — Z818 Family history of other mental and behavioral disorders: Secondary | ICD-10-CM

## 2016-08-02 DIAGNOSIS — Z79899 Other long term (current) drug therapy: Secondary | ICD-10-CM | POA: Diagnosis not present

## 2016-08-02 DIAGNOSIS — F411 Generalized anxiety disorder: Secondary | ICD-10-CM | POA: Diagnosis not present

## 2016-08-02 MED ORDER — ZOLPIDEM TARTRATE 10 MG PO TABS
10.0000 mg | ORAL_TABLET | Freq: Every evening | ORAL | 2 refills | Status: DC | PRN
Start: 2016-08-02 — End: 2016-10-22

## 2016-08-02 MED ORDER — BUPROPION HCL ER (XL) 300 MG PO TB24: 300.0000 mg | ORAL_TABLET | ORAL | 2 refills | Status: DC

## 2016-08-02 NOTE — Progress Notes (Signed)
Patient ID: April Ward, female   DOB: [birth year], 56 y.o.   MRN: [id number] Patient ID: April Ward, female   DOB: 1961-03-10, 56 y.o.   MRN: [id number] Patient ID: April Ward, female   DOB: 11/11/1960, 56 y.o.   MRN: [id number] Patient ID: April Ward, female   DOB: Apr 04, 1960, 56 y.o.   MRN: [id number] Patient ID: April Ward, female   DOB: 1961-01-08, 56 y.o.   MRN: [id number] Patient ID: April Ward, female   DOB: [birth year], 56 y.o.   MRN: [id number] Patient ID: April Ward, female   DOB: 03-01-1961, 56 y.o.   MRN: [id number] Patient ID: April Ward, female   DOB: Jul 09, 1960, 56 y.o.   MRN: [id number] Patient ID: April Ward, female   DOB: 04-21-1960, 56 y.o.   MRN: [id number] Patient ID: April Ward, female   DOB: 1961/02/16, 56 y.o.   MRN: [id number] Patient ID: April Ward, female   DOB: Apr 11, 1960, 56 y.o.   MRN: [id number] Patient ID: April Ward, female   DOB: Jan 26, 1961, 56 y.o.   MRN: [id number] Patient ID: April Ward, female   DOB: [birth year], 56 y.o.   MRN: [id number] Patient ID: April Ward, female   DOB: [birth year], 56 y.o.   MRN: [id number] Patient ID: April Ward, female   DOB: 1960/12/29, 56 y.o.   MRN: [id number]  Psychiatric Assessment Adult  Patient Identification:  April Ward Date of Evaluation:  08/02/2016 Chief Complaint: "I'm doing ok" History of Chief Complaint:   Chief Complaint  Patient presents with  . Depression  . Anxiety  . Follow-up    Depression         Associated symptoms include decreased concentration, fatigue, appetite change and suicidal ideas.  Past medical history includes anxiety.   Anxiety  Symptoms include decreased concentration, nervous/anxious behavior and suicidal ideas.     this patient is a 56 year old married black female who lives with her husband, and 35 year old son and 22 year-old nephew  In [place]. 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 The patient states that she had a cyst removed in her left kidney in December. After she had the surgery initially she was told everything was okay. She later found that 60% of her kidney was removed and that she had renal cancer. She's very upset because of the conflicting information. She also developed severe anemia and probably will have to on iron infusion. She now doesn't trust the doctors because she doesn't know what to believe. She is scheduled to see an oncologist to make sure she doesn't need any more treatment for cancer. Her nephrologist so has released her and states she needs to come back in 6 months.  The patient travels 3 hours to her job and stays there in an apartment. She's been doing this since June. Now however she doesn't feel able to function. She's been very depressed since the surgery and the news about the cancer. She's been crying all the time. Her thoughts are racing and she is unable to sleep. She's very tired and doesn't have any appetite. She's had passive suicidal ideation but no specific plan. She doesn't want to be around people because she gets so upset.  The patient had a depressive episode in May. At that time her  sisters were upsetting her. Her mother died in July 16, 2011 and left her property to the patient and her 2 sisters. 2 sisters are not good about paying bills and the light bill is in her name. Her primary doctor had put her on  Vibryd which helped but her insurance did not cover it so she is no longer taking it. She's never been on any other antidepressants or had previous psychiatric treatment  The patient returns after 4 months. She is still not doing well. Her husband died suddenly of a heart attack on March 24 while he was driving. She is trying to handle the financial things like life insurance etc. and is having a lot of frustrations. Her thyroglobulin was also low and her  thyroid replacement was recently increased. She's having a lot of trouble sleeping even with the increased Seroquel. She's trying to "stay strong" for her son. She is seeing Maurice Small here for counseling. She thinks she would feel better if she could sleep so we will add Ambien temporarily. She is going to try to get back to her exercise program since she had bariatric surgery she doesn't want to reverse the improvements she has made Review of Systems  Constitutional: Positive for appetite change and fatigue.  Psychiatric/Behavioral: Positive for decreased concentration, depression, dysphoric mood, sleep disturbance and suicidal ideas. The patient is nervous/anxious.    Physical Exam not done Depressive Symptoms: depressed mood, anhedonia, insomnia, psychomotor retardation, fatigue, feelings of worthlessness/guilt, hopelessness, suicidal thoughts without plan, anxiety,  (Hypo) Manic Symptoms:   Elevated Mood:  No Irritable Mood:  No Grandiosity:  No Distractibility:  No Labiality of Mood:  No Delusions:  No Hallucinations:  No Impulsivity:  No Sexually Inappropriate Behavior:  No Financial Extravagance:  No Flight of Ideas:  No  Anxiety Symptoms: Excessive Worry:  Yes Panic Symptoms:  Yes Agoraphobia:  Yes Obsessive Compulsive: No  Symptoms: None, Specific Phobias:  No Social Anxiety:  Yes  Psychotic Symptoms:  Hallucinations: No None Delusions:  No Paranoia:  No   Ideas of Reference:  No  PTSD Symptoms: Ever had a traumatic exposure:  No Had a traumatic exposure in the last month:  No Re-experiencing: No None Hypervigilance:  No Hyperarousal: No None Avoidance: No None  Traumatic Brain Injury: No   Past Psychiatric History: Diagnosis: Maj. depression   Hospitalizations: None   Outpatient Care: None   Substance Abuse Care: None   Self-Mutilation: None   Suicidal Attempts: None   Violent Behaviors: None    Past Medical History:   Past Medical History:   Diagnosis Date  . Anxiety   . Arthritis   . Depression   . Diabetes mellitus without complication ([place])   . GERD (gastroesophageal reflux disease)   . Gout   . Heart murmur   . Hypertension   . Renal cancer ([place])   . Renal disease 10/2012  . Renal insufficiency    History of Loss of Consciousness:  No Seizure History:  No Cardiac History:  No Allergies:   Allergies  Allergen Reactions  . Skelaxin [Metaxalone] Hives and Rash   Current Medications:  Current Outpatient Prescriptions  Medication Sig Dispense Refill  . amLODipine (NORVASC) 5 MG tablet Take 1 tablet (5 mg total) by mouth daily. 90 tablet 3  . calcium citrate-vitamin D 500-400 MG-UNIT chewable tablet Chew 1 tablet by mouth 3 (three) times daily.    . carbamazepine (TEGRETOL) 200 MG tablet Take 2 tablets (400 mg total) by mouth daily. 180 tablet  2  . diazepam (VALIUM) 10 MG tablet Take 1 tablet (10 mg total) by mouth 4 (four) times daily. 120 tablet 2  . furosemide (LASIX) 40 MG tablet Take 40 mg by mouth as needed.    Marland Kitchen levothyroxine (SYNTHROID, LEVOTHROID) 200 MCG tablet Take 200 mcg by mouth daily before breakfast.    . losartan (COZAAR) 50 MG tablet Take 100 mg by mouth daily.     . meclizine (ANTIVERT) 25 MG tablet Take 1 tablet (25 mg total) by mouth every 8 (eight) hours as needed for dizziness. 90 tablet 0  . Multiple Vitamin (MULTIVITAMIN) tablet Take 1 tablet by mouth 3 (three) times daily.     Marland Kitchen nystatin (MYCOSTATIN/NYSTOP) powder as needed.    Marland Kitchen omeprazole (PRILOSEC) 20 MG capsule Take 20 mg by mouth daily.    . prazosin (MINIPRESS) 5 MG capsule Take 1 capsule (5 mg total) by mouth at bedtime. 90 capsule 2  . QUEtiapine (SEROQUEL) 100 MG tablet Take 1 tablet (100 mg total) by mouth at bedtime. 90 tablet 2  . vitamin B-12 (CYANOCOBALAMIN) 500 MCG tablet Take 500 mcg by mouth daily.    Marland Kitchen buPROPion (WELLBUTRIN XL) 300 MG 24 hr tablet Take 1 tablet (300 mg total) by mouth every morning. 60 tablet 2  . zolpidem  (AMBIEN) 10 MG tablet Take 1 tablet (10 mg total) by mouth at bedtime as needed for sleep. 30 tablet 2   No current facility-administered medications for this visit.     Previous Psychotropic Medications:  Medication Dose   Xanax   0.5 mg 3 times a day   Vibryd  40 mg every morning                   Substance Abuse History in the last 12 months: Substance Age of 1st Use Last Use Amount Specific Type  Nicotine      Alcohol      Cannabis      Opiates      Cocaine      Methamphetamines      LSD      Ecstasy      Benzodiazepines      Caffeine      Inhalants      Others:                          Medical Consequences of Substance Abuse:n/a  Legal Consequences of Substance Abuse: n/a  Family Consequences of Substance Abuse: n/a  Blackouts:  No DT's:  No Withdrawal Symptoms:  No None  Social History: Current Place of Residence: [place] of Birth: 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 Legal History: None Hobbies/Interests: Traveling, visiting with grandchildren  Family History:   Family History  Problem Relation Age of Onset  . Heart attack Mother   . Heart attack Father   . Depression Paternal Aunt   . Alcohol abuse Maternal Uncle   . Colon cancer Maternal Aunt   . Colon cancer Maternal Uncle     Mental Status Examination/Evaluation: Objective:  Appearance: Casual and Fairly Groomed   Eye Contact::  Fair  Speech:  Clear and Coherent  Volume:  Normal  Mood: Somewhat depressed  but better than last visit   Affect: Constricted   Thought Process:  Intact  Orientation:  Full (Time, Place, and Person)   Thought Content:  WDL  Suicidal Thoughts:no  Homicidal Thoughts:no  Judgement:  Intact  Insight:  Good  Psychomotor Activity:  Normal  Akathisia:  No  Handed:  Right  AIMS (if indicated):    Assets:  Communication Skills Desire for Improvement    Laboratory/X-Ray Psychological Evaluation(s)       Assessment:  Axis I: Generalized Anxiety Disorder and Major Depression, Recurrent severe  AXIS I Generalized Anxiety Disorder and Major Depression, Recurrent severe  AXIS II Deferred  AXIS III Past Medical History:  Diagnosis Date  . Anxiety   . Arthritis   . Depression   . Diabetes mellitus without complication ([place])   . GERD (gastroesophageal reflux disease)   . Gout   . Heart murmur   . Hypertension   . Renal cancer ([place])   . Renal disease 10/2012  . Renal insufficiency      AXIS IV other psychosocial or environmental problems  AXIS V 51-60 moderate symptoms   Treatment Plan/Recommendations:  Plan of Care: Medication management   Laboratory:   Psychotherapy:She'll restart her therapy with Peggy Bynum    Medications: She'll continue Wellbutrin  300 mg XL daily, continue Tegretol and Seroquel for mood stabilization and Valium for anxiety and prazosin 5 mg at bedtime for nightmares. She'll Into new Seroquel 100 mg at bedtime . she'll add Ambien 10 mg at bedtime for sleep   Routine PRN Medications:  No  Consultations:   Safety Concerns:  She contracts for safety   Other:    return in 4 weeks     [name], MD 5/3/20189:54 AM

## 2016-08-21 DIAGNOSIS — Z6830 Body mass index (BMI) 30.0-30.9, adult: Secondary | ICD-10-CM | POA: Diagnosis not present

## 2016-08-21 DIAGNOSIS — I10 Essential (primary) hypertension: Secondary | ICD-10-CM | POA: Diagnosis not present

## 2016-08-21 DIAGNOSIS — E669 Obesity, unspecified: Secondary | ICD-10-CM | POA: Diagnosis not present

## 2016-08-30 ENCOUNTER — Ambulatory Visit (INDEPENDENT_AMBULATORY_CARE_PROVIDER_SITE_OTHER): Payer: Medicare PPO | Admitting: Psychiatry

## 2016-08-30 ENCOUNTER — Encounter (HOSPITAL_COMMUNITY): Payer: Self-pay | Admitting: Psychiatry

## 2016-08-30 VITALS — BP 163/92 | HR 58 | Ht 71.0 in | Wt 223.0 lb

## 2016-08-30 DIAGNOSIS — F411 Generalized anxiety disorder: Secondary | ICD-10-CM

## 2016-08-30 DIAGNOSIS — Z818 Family history of other mental and behavioral disorders: Secondary | ICD-10-CM | POA: Diagnosis not present

## 2016-08-30 DIAGNOSIS — F322 Major depressive disorder, single episode, severe without psychotic features: Secondary | ICD-10-CM

## 2016-08-30 DIAGNOSIS — Z811 Family history of alcohol abuse and dependence: Secondary | ICD-10-CM

## 2016-08-30 MED ORDER — PRAZOSIN HCL 5 MG PO CAPS: 5.0000 mg | ORAL_CAPSULE | Freq: Every day | ORAL | 2 refills | Status: DC

## 2016-08-30 MED ORDER — QUETIAPINE FUMARATE 100 MG PO TABS: 100.0000 mg | ORAL_TABLET | Freq: Every day | ORAL | 2 refills | Status: DC

## 2016-08-30 MED ORDER — CARBAMAZEPINE 200 MG PO TABS: 400.0000 mg | ORAL_TABLET | Freq: Every day | ORAL | 2 refills | Status: DC

## 2016-08-30 MED ORDER — BUPROPION HCL ER (XL) 300 MG PO TB24: 300.0000 mg | ORAL_TABLET | ORAL | 2 refills | Status: DC

## 2016-08-30 NOTE — Progress Notes (Signed)
Patient ID: JANAYIA BURGGRAF, female   DOB: 05/22/1960, 56 y.o.   MRN: [id number] Patient ID: MEKAELA AZIZI, female   DOB: Dec 13, 1960, 56 y.o.   MRN: [id number] Patient ID: BRENTLEY HORRELL, female   DOB: [birth year], 56 y.o.   MRN: [id number] Patient ID: LINDEE LEASON, female   DOB: 1960/05/14, 56 y.o.   MRN: [id number] Patient ID: IMANIE DARROW, female   DOB: 10-22-1960, 56 y.o.   MRN: [id number] Patient ID: JULIET VASBINDER, female   DOB: 01-07-1961, 56 y.o.   MRN: [id number] Patient ID: ANALYCIA KHOKHAR, female   DOB: 04-01-1961, 56 y.o.   MRN: [id number] Patient ID: MAKALYA NAVE, female   DOB: [birth year], 56 y.o.   MRN: [id number] Patient ID: ALLYSEN LAZO, female   DOB: 1960-10-31, 56 y.o.   MRN: [id number] Patient ID: JASMIN WINBERRY, female   DOB: Nov 05, 1960, 56 y.o.   MRN: [id number] Patient ID: SAMARA STANKOWSKI, female   DOB: [birth year], 56 y.o.   MRN: [id number] Patient ID: AIRYANNA DIPALMA, female   DOB: 1960/04/10, 56 y.o.   MRN: [id number] Patient ID: MEE MACDONNELL, female   DOB: 1960/06/28, 56 y.o.   MRN: [id number] Patient ID: OCIE STANZIONE, female   DOB: 25-Mar-1961, 56 y.o.   MRN: [id number] Patient ID: CANDICE TOBEY, female   DOB: [birth year], 56 y.o.   MRN: [id number]  Psychiatric Assessment Adult  Patient Identification:  ARALY KAAS Date of Evaluation:  08/30/2016 Chief Complaint: "I'm doing ok" History of Chief Complaint:   Chief Complaint  Patient presents with  . Anxiety  . Depression  . Follow-up    Depression         Associated symptoms include decreased concentration, fatigue, appetite change and suicidal ideas.  Past medical history includes anxiety.   Anxiety  Symptoms include decreased concentration, nervous/anxious behavior and suicidal ideas.     this patient is a 56 year old married black female who lives with her husband, and 59 year old son and 2 year-old nephew  In [place]. 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 The patient states that she had a cyst removed in her left kidney in December. After she had the surgery initially she was told everything was okay. She later found that 60% of her kidney was removed and that she had renal cancer. She's very upset because of the conflicting information. She also developed severe anemia and probably will have to on iron infusion. She now doesn't trust the doctors because she doesn't know what to believe. She is scheduled to see an oncologist to make sure she doesn't need any more treatment for cancer. Her nephrologist so has released her and states she needs to come back in 6 months.  The patient travels 3 hours to her job and stays there in an apartment. She's been doing this since June. Now however she doesn't feel able to function. She's been very depressed since the surgery and the news about the cancer. She's been crying all the time. Her thoughts are racing and she is unable to sleep. She's very tired and doesn't have any appetite. She's had passive suicidal ideation but no specific plan. She doesn't want to be around people because she gets so upset.  The patient had a depressive episode in May. At that time her  sisters were upsetting her. Her mother died in 07/02/11 and left her property to the patient and her 2 sisters. 2 sisters are not good about paying bills and the light bill is in her name. Her primary doctor had put her on  Vibryd which helped but her insurance did not cover it so she is no longer taking it. She's never been on any other antidepressants or had previous psychiatric treatment  The patient returns after 4 weeks. She still struggling with her husband's death in 07/02/22. Her mood is up and down which is understandable. Last time she was not sleeping and we added Ambien which is helped at times but other nights she still doesn't sleep well. She is doing everything to  maintain her health. She is still dieting and exercising and working with the bariatrics postsurgical program. She denies suicidal ideation or auditory or visual hallucinations. She really doesn't want to do counseling right now but I suggested a grief support group Review of Systems  Constitutional: Positive for appetite change and fatigue.  Psychiatric/Behavioral: Positive for decreased concentration, depression, dysphoric mood, sleep disturbance and suicidal ideas. The patient is nervous/anxious.    Physical Exam not done Depressive Symptoms: depressed mood, anhedonia, insomnia, psychomotor retardation, fatigue, feelings of worthlessness/guilt, hopelessness, suicidal thoughts without plan, anxiety,  (Hypo) Manic Symptoms:   Elevated Mood:  No Irritable Mood:  No Grandiosity:  No Distractibility:  No Labiality of Mood:  No Delusions:  No Hallucinations:  No Impulsivity:  No Sexually Inappropriate Behavior:  No Financial Extravagance:  No Flight of Ideas:  No  Anxiety Symptoms: Excessive Worry:  Yes Panic Symptoms:  Yes Agoraphobia:  Yes Obsessive Compulsive: No  Symptoms: None, Specific Phobias:  No Social Anxiety:  Yes  Psychotic Symptoms:  Hallucinations: No None Delusions:  No Paranoia:  No   Ideas of Reference:  No  PTSD Symptoms: Ever had a traumatic exposure:  No Had a traumatic exposure in the last month:  No Re-experiencing: No None Hypervigilance:  No Hyperarousal: No None Avoidance: No None  Traumatic Brain Injury: No   Past Psychiatric History: Diagnosis: Maj. depression   Hospitalizations: None   Outpatient Care: None   Substance Abuse Care: None   Self-Mutilation: None   Suicidal Attempts: None   Violent Behaviors: None    Past Medical History:   Past Medical History:  Diagnosis Date  . Anxiety   . Arthritis   . Depression   . Diabetes mellitus without complication ([place])   . GERD (gastroesophageal reflux disease)   . Gout   .  Heart murmur   . Hypertension   . Renal cancer ([place])   . Renal disease 10/2012  . Renal insufficiency    History of Loss of Consciousness:  No Seizure History:  No Cardiac History:  No Allergies:   Allergies  Allergen Reactions  . Skelaxin [Metaxalone] Hives and Rash   Current Medications:  Current Outpatient Prescriptions  Medication Sig Dispense Refill  . amLODipine (NORVASC) 5 MG tablet Take 1 tablet (5 mg total) by mouth daily. 90 tablet 3  . buPROPion (WELLBUTRIN XL) 300 MG 24 hr tablet Take 1 tablet (300 mg total) by mouth every morning. 60 tablet 2  . calcium citrate-vitamin D 500-400 MG-UNIT chewable tablet Chew 1 tablet by mouth 3 (three) times daily.    . carbamazepine (TEGRETOL) 200 MG tablet Take 2 tablets (400 mg total) by mouth daily. 180 tablet 2  . diazepam (VALIUM) 10 MG tablet Take 1 tablet (10 mg  total) by mouth 4 (four) times daily. 120 tablet 2  . furosemide (LASIX) 40 MG tablet Take 40 mg by mouth as needed.    Marland Kitchen levothyroxine (SYNTHROID, LEVOTHROID) 200 MCG tablet Take 200 mcg by mouth daily before breakfast.    . losartan (COZAAR) 50 MG tablet Take 100 mg by mouth daily.     . meclizine (ANTIVERT) 25 MG tablet Take 1 tablet (25 mg total) by mouth every 8 (eight) hours as needed for dizziness. 90 tablet 0  . Multiple Vitamin (MULTIVITAMIN) tablet Take 1 tablet by mouth 3 (three) times daily.     . naltrexone (DEPADE) 50 MG tablet     . nystatin (MYCOSTATIN/NYSTOP) powder as needed.    Marland Kitchen omeprazole (PRILOSEC) 20 MG capsule Take 20 mg by mouth daily.    . prazosin (MINIPRESS) 5 MG capsule Take 1 capsule (5 mg total) by mouth at bedtime. 90 capsule 2  . QUEtiapine (SEROQUEL) 100 MG tablet Take 1 tablet (100 mg total) by mouth at bedtime. 90 tablet 2  . vitamin B-12 (CYANOCOBALAMIN) 500 MCG tablet Take 500 mcg by mouth daily.    Marland Kitchen zolpidem (AMBIEN) 10 MG tablet Take 1 tablet (10 mg total) by mouth at bedtime as needed for sleep. 30 tablet 2   No current  facility-administered medications for this visit.     Previous Psychotropic Medications:  Medication Dose   Xanax   0.5 mg 3 times a day   Vibryd  40 mg every morning                   Substance Abuse History in the last 12 months: Substance Age of 1st Use Last Use Amount Specific Type  Nicotine      Alcohol      Cannabis      Opiates      Cocaine      Methamphetamines      LSD      Ecstasy      Benzodiazepines      Caffeine      Inhalants      Others:                          Medical Consequences of Substance Abuse:n/a  Legal Consequences of Substance Abuse: n/a  Family Consequences of Substance Abuse: n/a  Blackouts:  No DT's:  No Withdrawal Symptoms:  No None  Social History: Current Place of Residence: [place] of Birth: [place] Family Members: Husband 2 children, one nephew 4 grandchildren Marital Status:  Married Children:   Sons: 1  Daughters: 1 Relationships:  Education:  Dentist Problems/Performance:  Religious Beliefs/Practices: Christian History of Abuse: none Occupational Experiences; 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 Legal History: None Hobbies/Interests: Traveling, visiting with grandchildren  Family History:   Family History  Problem Relation Age of Onset  . Heart attack Mother   . Heart attack Father   . Depression Paternal Aunt   . Alcohol abuse Maternal Uncle   . Colon cancer Maternal Aunt   . Colon cancer Maternal Uncle     Mental Status Examination/Evaluation: Objective:  Appearance: Casual and Fairly Groomed   Engineer, water::  Fair  Speech:  Clear and Coherent  Volume:  Normal  Mood: A little dysphoric   Affect: Constricted   Thought Process:  Intact  Orientation:  Full (Time, Place, and Person)  Thought Content:  WDL  Suicidal  Thoughts:no  Homicidal Thoughts:no  Judgement:  Intact  Insight:  Good  Psychomotor Activity:  Normal   Akathisia:  No  Handed:  Right  AIMS (if indicated):    Assets:  Communication Skills Desire for Improvement    Laboratory/X-Ray Psychological Evaluation(s)       Assessment:  Axis I: Generalized Anxiety Disorder and Major Depression, Recurrent severe  AXIS I Generalized Anxiety Disorder and Major Depression, Recurrent severe  AXIS II Deferred  AXIS III Past Medical History:  Diagnosis Date  . Anxiety   . Arthritis   . Depression   . Diabetes mellitus without complication ([place])   . GERD (gastroesophageal reflux disease)   . Gout   . Heart murmur   . Hypertension   . Renal cancer ([place])   . Renal disease 10/2012  . Renal insufficiency      AXIS IV other psychosocial or environmental problems  AXIS V 51-60 moderate symptoms   Treatment Plan/Recommendations:  Plan of Care: Medication management   Laboratory:   Psychotherapy:She'll restart her therapy with Peggy Bynum    Medications: She'll continue Wellbutrin  300 mg XL daily, continue Tegretol and Seroquel for mood stabilization and Valium for anxiety and prazosin 5 mg at bedtime for nightmares. She'll Continue Seroquel 100 mg at bedtime And add Ambien 10 mg at bedtime for sleep   Routine PRN Medications:  No  Consultations:   Safety Concerns:  She contracts for safety   Other:    return in 2 months     [name], MD 5/31/201812:02 PM

## 2016-09-05 DIAGNOSIS — H53001 Unspecified amblyopia, right eye: Secondary | ICD-10-CM | POA: Diagnosis not present

## 2016-09-05 DIAGNOSIS — Z961 Presence of intraocular lens: Secondary | ICD-10-CM | POA: Diagnosis not present

## 2016-09-20 DIAGNOSIS — Z79890 Hormone replacement therapy (postmenopausal): Secondary | ICD-10-CM | POA: Diagnosis not present

## 2016-09-20 DIAGNOSIS — C73 Malignant neoplasm of thyroid gland: Secondary | ICD-10-CM | POA: Diagnosis not present

## 2016-09-20 DIAGNOSIS — E890 Postprocedural hypothyroidism: Secondary | ICD-10-CM | POA: Diagnosis not present

## 2016-10-22 ENCOUNTER — Ambulatory Visit (INDEPENDENT_AMBULATORY_CARE_PROVIDER_SITE_OTHER): Payer: Medicare PPO | Admitting: Psychiatry

## 2016-10-22 ENCOUNTER — Encounter (HOSPITAL_COMMUNITY): Payer: Self-pay | Admitting: Psychiatry

## 2016-10-22 VITALS — BP 158/100 | Ht 71.0 in | Wt 226.0 lb

## 2016-10-22 DIAGNOSIS — Z811 Family history of alcohol abuse and dependence: Secondary | ICD-10-CM | POA: Diagnosis not present

## 2016-10-22 DIAGNOSIS — F411 Generalized anxiety disorder: Secondary | ICD-10-CM | POA: Diagnosis not present

## 2016-10-22 DIAGNOSIS — Z818 Family history of other mental and behavioral disorders: Secondary | ICD-10-CM | POA: Diagnosis not present

## 2016-10-22 DIAGNOSIS — F322 Major depressive disorder, single episode, severe without psychotic features: Secondary | ICD-10-CM

## 2016-10-22 MED ORDER — DIAZEPAM 10 MG PO TABS: 10.0000 mg | ORAL_TABLET | Freq: Four times a day (QID) | ORAL | 2 refills | Status: DC

## 2016-10-22 MED ORDER — BUPROPION HCL ER (XL) 300 MG PO TB24: 300.0000 mg | ORAL_TABLET | ORAL | 2 refills | Status: DC

## 2016-10-22 MED ORDER — TEMAZEPAM 15 MG PO CAPS: 15.0000 mg | ORAL_CAPSULE | Freq: Every day | ORAL | 2 refills | Status: DC

## 2016-10-22 MED ORDER — PRAZOSIN HCL 5 MG PO CAPS: 5.0000 mg | ORAL_CAPSULE | Freq: Every day | ORAL | 2 refills | Status: DC

## 2016-10-22 MED ORDER — QUETIAPINE FUMARATE 100 MG PO TABS: 100.0000 mg | ORAL_TABLET | Freq: Every day | ORAL | 2 refills | Status: DC

## 2016-10-22 MED ORDER — CARBAMAZEPINE 200 MG PO TABS: 400.0000 mg | ORAL_TABLET | Freq: Every day | ORAL | 2 refills | Status: DC

## 2016-10-22 NOTE — Progress Notes (Signed)
Patient ID: April Ward, female   DOB: 07/03/1960, 56 y.o.   MRN: [id number] Patient ID: April Ward, female   DOB: 07/01/1960, 56 y.o.   MRN: [id number] Patient ID: April Ward, female   DOB: [birth year], 56 y.o.   MRN: [id number] Patient ID: April Ward, female   DOB: [birth year], 56 y.o.   MRN: [id number] Patient ID: April Ward, female   DOB: May 05, 1960, 56 y.o.   MRN: [id number] Patient ID: April Ward, female   DOB: May 23, 1960, 56 y.o.   MRN: [id number] Patient ID: April Ward, female   DOB: 1961-03-07, 56 y.o.   MRN: [id number] Patient ID: April Ward, female   DOB: September 12, 1960, 56 y.o.   MRN: [id number] Patient ID: April Ward, female   DOB: 1960/10/05, 56 y.o.   MRN: [id number] Patient ID: April Ward, female   DOB: [birth year], 56 y.o.   MRN: [id number] Patient ID: April Ward, female   DOB: December 12, 1960, 56 y.o.   MRN: [id number] Patient ID: April Ward, female   DOB: 02/01/1961, 56 y.o.   MRN: [id number] Patient ID: April Ward, female   DOB: 1960/07/19, 56 y.o.   MRN: [id number] Patient ID: April Ward, female   DOB: 1961/03/31, 56 y.o.   MRN: [id number] Patient ID: April Ward, female   DOB: [birth year], 56 y.o.   MRN: [id number]  Psychiatric Assessment Adult  Patient Identification:  April Ward Date of Evaluation:  10/22/2016 Chief Complaint: "I'm doing ok" History of Chief Complaint:   Chief Complaint  Patient presents with  . Depression  . Anxiety  . Follow-up    Anxiety  Symptoms include decreased concentration, nervous/anxious behavior and suicidal ideas.    Depression         Associated symptoms include decreased concentration, fatigue, appetite change and suicidal ideas.  Past medical history includes anxiety.    this patient is a 56 year old widowed black female who lives with her  son  In [place]. 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 The patient states that she had a cyst removed in her left kidney in December. After she had the surgery initially she was told everything was okay. She later found that 60% of her kidney was removed and that she had renal cancer. She's very upset because of the conflicting information. She also developed severe anemia and probably will have to on iron infusion. She now doesn't trust the doctors because she doesn't know what to believe. She is scheduled to see an oncologist to make sure she doesn't need any more treatment for cancer. Her nephrologist so has released her and states she needs to come back in 6 months.  The patient travels 3 hours to her job and stays there in an apartment. She's been doing this since June. Now however she doesn't feel able to function. She's been very depressed since the surgery and the news about the cancer. She's been crying all the time. Her thoughts are racing and she is unable to sleep. She's very tired and doesn't have any appetite. She's had passive suicidal ideation but no specific plan. She doesn't want to be around people because she gets so upset.  The patient had a depressive episode in May. At that time her sisters were upsetting her. Her mother  died in 07/30/2011 and left her property to the patient and her 2 sisters. 2 sisters are not good about paying bills and the light bill is in her name. Her primary doctor had put her on  Vibryd which helped but her insurance did not cover it so she is no longer taking it. She's never been on any other antidepressants or had previous psychiatric treatment  The patient returns after 2 months. She still struggling since her husband died suddenly in 07/30/22. She's having good and bad days and having a good deal of difficulty sleeping. The Ambien 10 mg is not helping and she is only getting 4 or 5 hours a night. At times she also gets leg cramps at night. I suggested we switch to Restoril and she  is willing to try this. Her moods are somewhat up and down but she is not suicidal and she is trying to stick with her exercise plan. She doesn't want to go to church or participate in grief counseling right now. She prefers to stay to herself Review of Systems  Constitutional: Positive for appetite change and fatigue.  Psychiatric/Behavioral: Positive for decreased concentration, depression, dysphoric mood, sleep disturbance and suicidal ideas. The patient is nervous/anxious.    Physical Exam not done Depressive Symptoms: depressed mood, anhedonia, insomnia, psychomotor retardation, fatigue, feelings of worthlessness/guilt, hopelessness, suicidal thoughts without plan, anxiety,  (Hypo) Manic Symptoms:   Elevated Mood:  No Irritable Mood:  No Grandiosity:  No Distractibility:  No Labiality of Mood:  No Delusions:  No Hallucinations:  No Impulsivity:  No Sexually Inappropriate Behavior:  No Financial Extravagance:  No Flight of Ideas:  No  Anxiety Symptoms: Excessive Worry:  Yes Panic Symptoms:  Yes Agoraphobia:  Yes Obsessive Compulsive: No  Symptoms: None, Specific Phobias:  No Social Anxiety:  Yes  Psychotic Symptoms:  Hallucinations: No None Delusions:  No Paranoia:  No   Ideas of Reference:  No  PTSD Symptoms: Ever had a traumatic exposure:  No Had a traumatic exposure in the last month:  No Re-experiencing: No None Hypervigilance:  No Hyperarousal: No None Avoidance: No None  Traumatic Brain Injury: No   Past Psychiatric History: Diagnosis: Maj. depression   Hospitalizations: None   Outpatient Care: None   Substance Abuse Care: None   Self-Mutilation: None   Suicidal Attempts: None   Violent Behaviors: None    Past Medical History:   Past Medical History:  Diagnosis Date  . Anxiety   . Arthritis   . Depression   . Diabetes mellitus without complication ([place])   . GERD (gastroesophageal reflux disease)   . Gout   . Heart murmur   .  Hypertension   . Renal cancer ([place])   . Renal disease 10/2012  . Renal insufficiency    History of Loss of Consciousness:  No Seizure History:  No Cardiac History:  No Allergies:   Allergies  Allergen Reactions  . Skelaxin [Metaxalone] Hives and Rash   Current Medications:  Current Outpatient Prescriptions  Medication Sig Dispense Refill  . amLODipine (NORVASC) 5 MG tablet Take 1 tablet (5 mg total) by mouth daily. 90 tablet 3  . buPROPion (WELLBUTRIN XL) 300 MG 24 hr tablet Take 1 tablet (300 mg total) by mouth every morning. 60 tablet 2  . calcium citrate-vitamin D 500-400 MG-UNIT chewable tablet Chew 1 tablet by mouth 3 (three) times daily.    . carbamazepine (TEGRETOL) 200 MG tablet Take 2 tablets (400 mg total) by mouth daily. 180 tablet  2  . diazepam (VALIUM) 10 MG tablet Take 1 tablet (10 mg total) by mouth 4 (four) times daily. 120 tablet 2  . furosemide (LASIX) 40 MG tablet Take 40 mg by mouth as needed.    Marland Kitchen levothyroxine (SYNTHROID, LEVOTHROID) 200 MCG tablet Take 200 mcg by mouth daily before breakfast.    . losartan (COZAAR) 50 MG tablet Take 100 mg by mouth daily.     . meclizine (ANTIVERT) 25 MG tablet Take 1 tablet (25 mg total) by mouth every 8 (eight) hours as needed for dizziness. 90 tablet 0  . Multiple Vitamin (MULTIVITAMIN) tablet Take 1 tablet by mouth 3 (three) times daily.     . naltrexone (DEPADE) 50 MG tablet     . nystatin (MYCOSTATIN/NYSTOP) powder as needed.    Marland Kitchen omeprazole (PRILOSEC) 20 MG capsule Take 20 mg by mouth daily.    . prazosin (MINIPRESS) 5 MG capsule Take 1 capsule (5 mg total) by mouth at bedtime. 90 capsule 2  . QUEtiapine (SEROQUEL) 100 MG tablet Take 1 tablet (100 mg total) by mouth at bedtime. 90 tablet 2  . temazepam (RESTORIL) 15 MG capsule Take 1 capsule (15 mg total) by mouth at bedtime. 30 capsule 2  . vitamin B-12 (CYANOCOBALAMIN) 500 MCG tablet Take 500 mcg by mouth daily.     No current facility-administered medications for this  visit.     Previous Psychotropic Medications:  Medication Dose   Xanax   0.5 mg 3 times a day   Vibryd  40 mg every morning                   Substance Abuse History in the last 12 months: Substance Age of 1st Use Last Use Amount Specific Type  Nicotine      Alcohol      Cannabis      Opiates      Cocaine      Methamphetamines      LSD      Ecstasy      Benzodiazepines      Caffeine      Inhalants      Others:                          Medical Consequences of Substance Abuse:n/a  Legal Consequences of Substance Abuse: n/a  Family Consequences of Substance Abuse: n/a  Blackouts:  No DT's:  No Withdrawal Symptoms:  No None  Social History: Current Place of Residence: [place] of Birth: [place] Family Members: Husband 2 children, one nephew 4 grandchildren Marital Status:  Married Children:   Sons: 1  Daughters: 1 Relationships:  Education:  Dentist Problems/Performance:  Religious Beliefs/Practices: Christian History of Abuse: none Occupational Experiences; 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 Legal History: None Hobbies/Interests: Traveling, visiting with grandchildren  Family History:   Family History  Problem Relation Age of Onset  . Heart attack Mother   . Heart attack Father   . Depression Paternal Aunt   . Alcohol abuse Maternal Uncle   . Colon cancer Maternal Aunt   . Colon cancer Maternal Uncle     Mental Status Examination/Evaluation: Objective:  Appearance: Casual and Fairly Groomed   Engineer, water::  Fair  Speech:  Clear and Coherent  Volume:  Normal  Mood:  dysphoric   Affect: Constricted And tearful   Thought Process:  Intact  Orientation:  Full (Time,  Place, and Person)  Thought Content:  WDL  Suicidal Thoughts:no  Homicidal Thoughts:no  Judgement:  Intact  Insight:  Good  Psychomotor Activity:  Normal  Akathisia:  No  Handed:  Right  AIMS (if  indicated):    Assets:  Communication Skills Desire for Improvement    Laboratory/X-Ray Psychological Evaluation(s)       Assessment:  Axis I: Generalized Anxiety Disorder and Major Depression, Recurrent severe  AXIS I Generalized Anxiety Disorder and Major Depression, Recurrent severe  AXIS II Deferred  AXIS III Past Medical History:  Diagnosis Date  . Anxiety   . Arthritis   . Depression   . Diabetes mellitus without complication ([place])   . GERD (gastroesophageal reflux disease)   . Gout   . Heart murmur   . Hypertension   . Renal cancer ([place])   . Renal disease 10/2012  . Renal insufficiency      AXIS IV other psychosocial or environmental problems  AXIS V 51-60 moderate symptoms   Treatment Plan/Recommendations:  Plan of Care: Medication management   Laboratory:   Psychotherapy:She'll restart her therapy with Peggy Bynum    Medications: She'll continue Wellbutrin  300 mg XL daily, continue Tegretol and Seroquel for mood stabilization and Valium for anxiety and prazosin 5 mg at bedtime for nightmares. She'll Continue Seroquel 100 mg at bedtime And add Restoril 15 mg at bedtime   Routine PRN Medications:  No  Consultations:   Safety Concerns:  She contracts for safety   Other:    return in 2 months     [name], MD 7/23/20184:12 PM

## 2016-10-24 ENCOUNTER — Ambulatory Visit (HOSPITAL_COMMUNITY): Payer: Self-pay | Admitting: Psychiatry

## 2016-10-26 DIAGNOSIS — N184 Chronic kidney disease, stage 4 (severe): Secondary | ICD-10-CM | POA: Diagnosis not present

## 2016-11-05 DIAGNOSIS — N184 Chronic kidney disease, stage 4 (severe): Secondary | ICD-10-CM | POA: Diagnosis not present

## 2016-11-05 DIAGNOSIS — E1122 Type 2 diabetes mellitus with diabetic chronic kidney disease: Secondary | ICD-10-CM | POA: Diagnosis not present

## 2016-11-05 DIAGNOSIS — I10 Essential (primary) hypertension: Secondary | ICD-10-CM | POA: Diagnosis not present

## 2016-12-19 ENCOUNTER — Ambulatory Visit (HOSPITAL_COMMUNITY): Payer: Medicare PPO | Admitting: Psychiatry

## 2016-12-24 DIAGNOSIS — E669 Obesity, unspecified: Secondary | ICD-10-CM | POA: Diagnosis not present

## 2016-12-24 DIAGNOSIS — Z6831 Body mass index (BMI) 31.0-31.9, adult: Secondary | ICD-10-CM | POA: Diagnosis not present

## 2016-12-24 DIAGNOSIS — Z9884 Bariatric surgery status: Secondary | ICD-10-CM | POA: Diagnosis not present

## 2016-12-24 DIAGNOSIS — R635 Abnormal weight gain: Secondary | ICD-10-CM | POA: Diagnosis not present

## 2017-02-06 DIAGNOSIS — N051 Unspecified nephritic syndrome with focal and segmental glomerular lesions: Secondary | ICD-10-CM | POA: Diagnosis not present

## 2017-02-06 DIAGNOSIS — D631 Anemia in chronic kidney disease: Secondary | ICD-10-CM | POA: Diagnosis not present

## 2017-02-06 DIAGNOSIS — Z85528 Personal history of other malignant neoplasm of kidney: Secondary | ICD-10-CM | POA: Diagnosis not present

## 2017-02-06 DIAGNOSIS — I129 Hypertensive chronic kidney disease with stage 1 through stage 4 chronic kidney disease, or unspecified chronic kidney disease: Secondary | ICD-10-CM | POA: Diagnosis not present

## 2017-02-06 DIAGNOSIS — N2581 Secondary hyperparathyroidism of renal origin: Secondary | ICD-10-CM | POA: Diagnosis not present

## 2017-02-06 DIAGNOSIS — N183 Chronic kidney disease, stage 3 (moderate): Secondary | ICD-10-CM | POA: Diagnosis not present

## 2017-02-06 DIAGNOSIS — Z6832 Body mass index (BMI) 32.0-32.9, adult: Secondary | ICD-10-CM | POA: Diagnosis not present

## 2017-02-06 DIAGNOSIS — E1122 Type 2 diabetes mellitus with diabetic chronic kidney disease: Secondary | ICD-10-CM | POA: Diagnosis not present

## 2017-02-06 DIAGNOSIS — Z905 Acquired absence of kidney: Secondary | ICD-10-CM | POA: Diagnosis not present

## 2017-02-08 ENCOUNTER — Ambulatory Visit: Payer: BLUE CROSS/BLUE SHIELD | Admitting: Podiatry

## 2017-03-05 ENCOUNTER — Encounter: Payer: Self-pay | Admitting: Cardiology

## 2017-03-05 ENCOUNTER — Ambulatory Visit: Payer: Self-pay | Admitting: Cardiology

## 2017-03-05 NOTE — Progress Notes (Deleted)
Clinical Summary Ms. Luty is a 56 y.o.female  1. Syncope - episode on Monday while at home in kitchen at [place] dizzy. Felt dizzy. She reports heart rate was around 48. Fell to floor. LOC for just a few seconds. No other episodes - had been feeling well prior.  - Poor oral intake that Sunday.  - Cr 3.10, K 3.3. Hgb 10.9, MCV 73,    1. HTN  - reports diagnosed with HTN age 70. Historically has been difficult to control - CT scan in 2006 with normal renal arteries. TSH 1.80. No EtoH. Normal renin and aldo ratio. Normal sleep study 10/2013 at [facility].  - since recent gastric sleeve surgery she has had significant weight loss, bp's have trended down and she has been taken off some of her bp meds  - typically 130-140s/70-80s at home   2. Obesity - had gastric sleeve procedure done, significant weight loss since that time.   3. CKD - followed Dr Kris Mouton at [facility].  - July 2016 Cr 2.62, BUN 40  4. Dizziness - can occur with lying, sitting or standing. Can occur with changing positions of her head. Often has sinus congestion, sinus pressure  Past Medical History:  Diagnosis Date  . Anxiety   . Arthritis   . Depression   . Diabetes mellitus without complication ([place])   . GERD (gastroesophageal reflux disease)   . Gout   . Heart murmur   . Hypertension   . Renal cancer ([place])   . Renal disease 10/2012  . Renal insufficiency      Allergies  Allergen Reactions  . Skelaxin [Metaxalone] Hives and Rash     Current Outpatient Medications  Medication Sig Dispense Refill  . amLODipine (NORVASC) 5 MG tablet Take 1 tablet (5 mg total) by mouth daily. 90 tablet 3  . buPROPion (WELLBUTRIN XL) 300 MG 24 hr tablet Take 1 tablet (300 mg total) by mouth every morning. 60 tablet 2  . calcium citrate-vitamin D 500-400 MG-UNIT chewable tablet Chew 1 tablet by mouth 3 (three) times daily.    . carbamazepine (TEGRETOL) 200 MG tablet Take 2 tablets (400 mg total) by mouth  daily. 180 tablet 2  . diazepam (VALIUM) 10 MG tablet Take 1 tablet (10 mg total) by mouth 4 (four) times daily. 120 tablet 2  . furosemide (LASIX) 40 MG tablet Take 40 mg by mouth as needed.    Marland Kitchen levothyroxine (SYNTHROID, LEVOTHROID) 200 MCG tablet Take 200 mcg by mouth daily before breakfast.    . losartan (COZAAR) 50 MG tablet Take 100 mg by mouth daily.     . meclizine (ANTIVERT) 25 MG tablet Take 1 tablet (25 mg total) by mouth every 8 (eight) hours as needed for dizziness. 90 tablet 0  . Multiple Vitamin (MULTIVITAMIN) tablet Take 1 tablet by mouth 3 (three) times daily.     . naltrexone (DEPADE) 50 MG tablet     . nystatin (MYCOSTATIN/NYSTOP) powder as needed.    Marland Kitchen omeprazole (PRILOSEC) 20 MG capsule Take 20 mg by mouth daily.    . prazosin (MINIPRESS) 5 MG capsule Take 1 capsule (5 mg total) by mouth at bedtime. 90 capsule 2  . QUEtiapine (SEROQUEL) 100 MG tablet Take 1 tablet (100 mg total) by mouth at bedtime. 90 tablet 2  . temazepam (RESTORIL) 15 MG capsule Take 1 capsule (15 mg total) by mouth at bedtime. 30 capsule 2  . vitamin B-12 (CYANOCOBALAMIN) 500 MCG tablet Take 500 mcg  by mouth daily.     No current facility-administered medications for this visit.      Past Surgical History:  Procedure Laterality Date  . CESAREAN SECTION  [id number]  . CHONDROPLASTY Right 08/22/2012   Procedure: CHONDROPLASTY;  Surgeon: Carole Civil, MD;  Location: [facility];  Service: Orthopedics;  Laterality: Right;  . COLONOSCOPY WITH PROPOFOL N/A 03/16/2014   Procedure: COLONOSCOPY WITH PROPOFOL (at cecum 1023, total withdrawal time=9 minutes);  Surgeon: Danie Binder, MD;  Location: [facility];  Service: Endoscopy;  Laterality: N/A;  . GASTRIC BYPASS    . KNEE ARTHROSCOPY WITH MEDIAL MENISECTOMY Right 08/22/2012   Procedure: KNEE ARTHROSCOPY WITH PARTIAL MEDIAL MENISECTOMY;  Surgeon: Carole Civil, MD;  Location: [facility];  Service: Orthopedics;  Laterality: Right;  . PARTIAL NEPHRECTOMY  Dec  2014   left  . TENDON REPAIR       Allergies  Allergen Reactions  . Skelaxin [Metaxalone] Hives and Rash      Family History  Problem Relation Age of Onset  . Heart attack Mother   . Heart attack Father   . Depression Paternal Aunt   . Alcohol abuse Maternal Uncle   . Colon cancer Maternal Aunt   . Colon cancer Maternal Uncle      Social History Ms. Hinger reports that  has never smoked. she has never used smokeless tobacco. Ms. Kirsten reports that she does not drink alcohol.   Review of Systems CONSTITUTIONAL: No weight loss, fever, chills, weakness or fatigue.  HEENT: Eyes: No visual loss, blurred vision, double vision or yellow sclerae.No hearing loss, sneezing, congestion, runny nose or sore throat.  SKIN: No rash or itching.  CARDIOVASCULAR:  RESPIRATORY: No shortness of breath, cough or sputum.  GASTROINTESTINAL: No anorexia, nausea, vomiting or diarrhea. No abdominal pain or blood.  GENITOURINARY: No burning on urination, no polyuria NEUROLOGICAL: No headache, dizziness, syncope, paralysis, ataxia, numbness or tingling in the extremities. No change in bowel or bladder control.  MUSCULOSKELETAL: No muscle, back pain, joint pain or stiffness.  LYMPHATICS: No enlarged nodes. No history of splenectomy.  PSYCHIATRIC: No history of depression or anxiety.  ENDOCRINOLOGIC: No reports of sweating, cold or heat intolerance. No polyuria or polydipsia.  Marland Kitchen   Physical Examination There were no vitals filed for this visit. There were no vitals filed for this visit.  Gen: resting comfortably, no acute distress HEENT: no scleral icterus, pupils equal round and reactive, no palptable cervical adenopathy,  CV Resp: Clear to auscultation bilaterally GI: abdomen is soft, non-tender, non-distended, normal bowel sounds, no hepatosplenomegaly MSK: extremities are warm, no edema.  Skin: warm, no rash Neuro:  no focal deficits Psych: appropriate affect   Diagnostic  Studies 09/2013 Carotid US 1-39% bilateral ICA disease  09/2013 Exercise MPI Overall low risk exercise/Lexiscan Cardiolite. Patient had limited exercise capacity achieving maximum work load of 7 METS, limited by shortness of breath and hypertensive response. There were no clearly diagnostic ST segment abnormalities. Occasional to frequent PVCs and ventricular couplets were noted early in exercise. There were no sustained arrhythmias. Perfusion imaging shows probable variable soft tissue attenuation, less likely a minor degree of basal lateral ischemia. LVEF is normal at 61% with upper normal chamber volume and no obvious wall motion abnormalities.    Assessment and Plan  1.Syncope - probable orthostatic syncope. SBP drops 18 ponts today in lcinic with standing, diastolic drops 11 points.  - encouraged increased oral intake - if recurrent episode, consider home event monitor  1.  HTN  - negative workup for secondary HTN. BP significantly improved since gastric sleeve procedure and significant weight loss - bp elevated in clinic but home numbers at goal, will conitnue current meds  2. Dizziness - orthostatics negative in clinic. Symptoms suggestive of vertigo, will prescribe prn meclizine.     Arnoldo Lenis, M.D.

## 2017-03-11 ENCOUNTER — Other Ambulatory Visit (HOSPITAL_COMMUNITY): Payer: Self-pay | Admitting: Psychiatry

## 2017-03-22 DIAGNOSIS — Z23 Encounter for immunization: Secondary | ICD-10-CM | POA: Diagnosis not present

## 2017-03-22 DIAGNOSIS — F4321 Adjustment disorder with depressed mood: Secondary | ICD-10-CM | POA: Diagnosis not present

## 2017-03-22 DIAGNOSIS — C73 Malignant neoplasm of thyroid gland: Secondary | ICD-10-CM | POA: Diagnosis not present

## 2017-04-17 ENCOUNTER — Encounter (HOSPITAL_COMMUNITY): Payer: Self-pay | Admitting: Psychiatry

## 2017-04-17 ENCOUNTER — Ambulatory Visit (INDEPENDENT_AMBULATORY_CARE_PROVIDER_SITE_OTHER): Payer: Medicare PPO | Admitting: Psychiatry

## 2017-04-17 VITALS — BP 190/81 | HR 57 | Ht 71.0 in | Wt 229.0 lb

## 2017-04-17 DIAGNOSIS — Z811 Family history of alcohol abuse and dependence: Secondary | ICD-10-CM

## 2017-04-17 DIAGNOSIS — F411 Generalized anxiety disorder: Secondary | ICD-10-CM

## 2017-04-17 DIAGNOSIS — Z736 Limitation of activities due to disability: Secondary | ICD-10-CM | POA: Diagnosis not present

## 2017-04-17 DIAGNOSIS — F322 Major depressive disorder, single episode, severe without psychotic features: Secondary | ICD-10-CM

## 2017-04-17 DIAGNOSIS — Z818 Family history of other mental and behavioral disorders: Secondary | ICD-10-CM

## 2017-04-17 DIAGNOSIS — R450 Nervousness: Secondary | ICD-10-CM | POA: Diagnosis not present

## 2017-04-17 MED ORDER — BUPROPION HCL ER (XL) 300 MG PO TB24: 300.0000 mg | ORAL_TABLET | ORAL | 2 refills | Status: DC

## 2017-04-17 MED ORDER — TEMAZEPAM 15 MG PO CAPS: 15.0000 mg | ORAL_CAPSULE | Freq: Every day | ORAL | 2 refills | Status: DC

## 2017-04-17 MED ORDER — ALPRAZOLAM 1 MG PO TABS: 1.0000 mg | ORAL_TABLET | Freq: Four times a day (QID) | ORAL | 2 refills | Status: DC

## 2017-04-17 MED ORDER — PRAZOSIN HCL 5 MG PO CAPS: 5.0000 mg | ORAL_CAPSULE | Freq: Every day | ORAL | 2 refills | Status: DC

## 2017-04-17 MED ORDER — QUETIAPINE FUMARATE 100 MG PO TABS: 100.0000 mg | ORAL_TABLET | Freq: Every day | ORAL | 2 refills | Status: DC

## 2017-04-17 MED ORDER — CARBAMAZEPINE 200 MG PO TABS: 200.0000 mg | ORAL_TABLET | Freq: Every day | ORAL | 2 refills | Status: DC

## 2017-04-17 NOTE — Progress Notes (Signed)
[facility] MD/PA/NP OP Progress Note  04/17/2017 1:28 PM April Ward  MRN:  [id number]  Chief Complaint:  Chief Complaint    Depression; Anxiety; Follow-up; Manic Behavior     HPI: This patient is a 57 year old widowed black female who lives with her son and asked in [place].  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  Patient returns after 6 months for further treatment of depression and anxiety.  She has missed some appointments.  She states that she is not been doing well since her husband died last 07/26/22.  He died suddenly of a heart attack.  The holidays were particularly bad for her and she did not care to take many of her medications.  She got off her Synthroid and her thyroid test got really messed up.  She is now back on track with this after seeing her surgeon last month.  She does not feel like doing anything her energy is low and she is no longer exercising.  She has been avoiding people.  She realizes that most of this is due to a grief reaction and that she needs to get her life back in order.  She denies being suicidal.  She is trying to "stay strong" for her children.  I strongly suggested that she get back into therapy here and she agrees.  In looking at her medications she is on several psychotropic medicines and perhaps she is on too much which is causing some fatigue.  Many of these were started when she was going through a lot of stress at work a few years ago.  At this point I think we can start to taper off some of the mood stabilizers such as the Tegretol and we will decrease this by half today.  She also thinks that the Xanax works better for her anxiety than the Valium Visit Diagnosis:    ICD-10-CM   1. Major depressive disorder, single episode, severe without psychotic features ([place]) F32.2   2. GAD (generalized anxiety disorder) F41.1     Past Psychiatric History: none  Past Medical History:  Past Medical History:  Diagnosis Date  . Anxiety   .  Arthritis   . Depression   . Diabetes mellitus without complication ([place])   . GERD (gastroesophageal reflux disease)   . Gout   . Heart murmur   . Hypertension   . Renal cancer ([place])   . Renal disease 10/2012  . Renal insufficiency     Past Surgical History:  Procedure Laterality Date  . CESAREAN SECTION  [id number]  . CHONDROPLASTY Right 08/22/2012   Procedure: CHONDROPLASTY;  Surgeon: Carole Civil, MD;  Location: [facility];  Service: Orthopedics;  Laterality: Right;  . COLONOSCOPY WITH PROPOFOL N/A 03/16/2014   Procedure: COLONOSCOPY WITH PROPOFOL (at cecum 1023, total withdrawal time=9 minutes);  Surgeon: Danie Binder, MD;  Location: [facility];  Service: Endoscopy;  Laterality: N/A;  . GASTRIC BYPASS    . KNEE ARTHROSCOPY WITH MEDIAL MENISECTOMY Right 08/22/2012   Procedure: KNEE ARTHROSCOPY WITH PARTIAL MEDIAL MENISECTOMY;  Surgeon: Carole Civil, MD;  Location: [facility];  Service: Orthopedics;  Laterality: Right;  . PARTIAL NEPHRECTOMY  Dec 2014   left  . TENDON REPAIR      Family Psychiatric History: See below  Family History:  Family History  Problem Relation Age of Onset  . Heart attack Mother   . Heart attack Father   . Depression Paternal Aunt   .  Alcohol abuse Maternal Uncle   . Colon cancer Maternal Aunt   . Colon cancer Maternal Uncle     Social History:  Social History   Socioeconomic History  . Marital status: Married    Spouse name: None  . Number of children: None  . Years of education: None  . Highest education level: None  Social Needs  . Financial resource strain: None  . Food insecurity - worry: None  . Food insecurity - inability: None  . Transportation needs - medical: None  . Transportation needs - non-medical: None  Occupational History  . Occupation: Visual merchandiser: [employer]  Tobacco Use  . Smoking status: Never Smoker  . Smokeless tobacco: Never Used  Substance and Sexual Activity  . Alcohol use: No     Alcohol/week: 0.0 oz  . Drug use: No  . Sexual activity: None  Other Topics Concern  . None  Social History Narrative  . None    Allergies:  Allergies  Allergen Reactions  . Skelaxin [Metaxalone] Hives and Rash    Metabolic Disorder Labs: No results found for: HGBA1C, MPG No results found for: PROLACTIN Lab Results  Component Value Date   CHOL 215 (H) 03/11/2014   TRIG 288 (H) 03/11/2014   HDL 33 (L) 03/11/2014   CHOLHDL 6.5 03/11/2014   VLDL 58 (H) 03/11/2014   LDLCALC 124 (H) 03/11/2014   No results found for: TSH  Therapeutic Level Labs: No results found for: LITHIUM No results found for: VALPROATE No components found for:  CBMZ  Current Medications: Current Outpatient Medications  Medication Sig Dispense Refill  . amLODipine (NORVASC) 5 MG tablet Take 1 tablet (5 mg total) by mouth daily. 90 tablet 3  . buPROPion (WELLBUTRIN XL) 300 MG 24 hr tablet Take 1 tablet (300 mg total) by mouth every morning. 60 tablet 2  . calcium citrate-vitamin D 500-400 MG-UNIT chewable tablet Chew 1 tablet by mouth 3 (three) times daily.    . carbamazepine (TEGRETOL) 200 MG tablet Take 1 tablet (200 mg total) by mouth daily. 90 tablet 2  . furosemide (LASIX) 40 MG tablet Take 40 mg by mouth as needed.    Marland Kitchen levothyroxine (SYNTHROID, LEVOTHROID) 200 MCG tablet Take 200 mcg by mouth daily before breakfast.    . losartan (COZAAR) 50 MG tablet Take 100 mg by mouth daily.     . meclizine (ANTIVERT) 25 MG tablet Take 1 tablet (25 mg total) by mouth every 8 (eight) hours as needed for dizziness. 90 tablet 0  . Multiple Vitamin (MULTIVITAMIN) tablet Take 1 tablet by mouth 3 (three) times daily.     . naltrexone (DEPADE) 50 MG tablet     . nystatin (MYCOSTATIN/NYSTOP) powder as needed.    Marland Kitchen omeprazole (PRILOSEC) 20 MG capsule Take 20 mg by mouth daily.    . prazosin (MINIPRESS) 5 MG capsule Take 1 capsule (5 mg total) by mouth at bedtime. 90 capsule 2  . QUEtiapine (SEROQUEL) 100 MG tablet  Take 1 tablet (100 mg total) by mouth at bedtime. 90 tablet 2  . temazepam (RESTORIL) 15 MG capsule Take 1 capsule (15 mg total) by mouth at bedtime. 30 capsule 2  . vitamin B-12 (CYANOCOBALAMIN) 500 MCG tablet Take 500 mcg by mouth daily.    Marland Kitchen ALPRAZolam (XANAX) 1 MG tablet Take 1 tablet (1 mg total) by mouth 4 (four) times daily. 120 tablet 2   No current facility-administered medications for this visit.  Musculoskeletal: Strength & Muscle Tone: within normal limits Gait & Station: normal Patient leans: N/A  Psychiatric Specialty Exam: Review of Systems  Constitutional: Positive for malaise/fatigue.  Psychiatric/Behavioral: Positive for depression. The patient is nervous/anxious.   All other systems reviewed and are negative.   Blood pressure (!) 190/81, pulse (!) 57, height 5\' 11"  (1.803 m), weight 229 lb (103.9 kg).Body mass index is 31.94 kg/m.  General Appearance: Casual and Fairly Groomed  Eye Contact:  Good  Speech:  Clear and Coherent  Volume:  Normal  Mood:  Depressed and Hopeless  Affect:  Constricted and Depressed  Thought Process:  Goal Directed  Orientation:  Full (Time, Place, and Person)  Thought Content: Rumination   Suicidal Thoughts:  No  Homicidal Thoughts:  No  Memory:  Immediate;   Good Recent;   Good Remote;   Fair  Judgement:  Fair  Insight:  Fair  Psychomotor Activity:  Decreased  Concentration:  Concentration: Fair and Attention Span: Fair  Recall:  Good  Fund of Knowledge: Good  Language: Good  Akathisia:  No  Handed:  Left  AIMS (if indicated): not done  Assets:  Communication Skills Desire for Improvement Resilience Social Support Talents/Skills  ADL's:  Intact  Cognition: WNL  Sleep:  Poor   Screenings:   Assessment and Plan: This patient is a 57 year old female with a history of depression and anxiety.  When she was working and under a lot of stress she seemed to have hypomanic symptoms but now she just feels lethargic and  worn out.  We will cut back the Seroquel at bedtime to 200 mg.  She will continue Seroquel 100 mg at bedtime for mood stabilization but this may be the next 1 to be cut back.  She will continue Wellbutrin XL 300 mg daily for depression prazosin 5 mg at bedtime for nightmares.  We will discontinue Valium and start Xanax 1 mg 4 times daily for anxiety.  She will restart her counseling with Maurice Small here and return to see me in 2 months   [name], MD 04/17/2017, 1:28 PM

## 2017-04-29 DIAGNOSIS — Z9884 Bariatric surgery status: Secondary | ICD-10-CM | POA: Diagnosis not present

## 2017-05-01 ENCOUNTER — Encounter (HOSPITAL_COMMUNITY): Payer: Self-pay | Admitting: Psychiatry

## 2017-05-01 ENCOUNTER — Ambulatory Visit (HOSPITAL_COMMUNITY): Payer: Medicare PPO | Admitting: Psychiatry

## 2017-05-01 DIAGNOSIS — F411 Generalized anxiety disorder: Secondary | ICD-10-CM | POA: Diagnosis not present

## 2017-05-01 DIAGNOSIS — F329 Major depressive disorder, single episode, unspecified: Secondary | ICD-10-CM | POA: Diagnosis not present

## 2017-05-01 DIAGNOSIS — Z634 Disappearance and death of family member: Secondary | ICD-10-CM

## 2017-05-01 DIAGNOSIS — F322 Major depressive disorder, single episode, severe without psychotic features: Secondary | ICD-10-CM

## 2017-05-01 NOTE — Progress Notes (Signed)
   THERAPIST PROGRESS NOTE  Session Time:    Wednesday 05/01/2017 2:04 PM -  2:54 PM    Participation Level: Active  Behavioral Response: Casual/Alert/depressed/tearful   Type of Therapy: Individual Therapy  Treatment Goals addressed:Begin a healthy grieving process around the loss of husband   Interventions: CBT and Supportive  Summary: April Ward is a 57 y.o. female who presents with a history of recurrent symptoms of depression and anxiety. She states she initially became depressed in May of 2014 when she and her sisters began having issues regarding expenses for the family home. 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 Patient reports lifelong perfectionistic tendencies and says anxiety has worsened stating that she tends to think too much. Patient's symptoms included depressed mood, anxiety, racing thoughts, ruminating thoughts, sleep difficulty (2-3 hours of sleep per night) excessive worrying, crying spells, loss of interest in activities, and loss of appetite.   Patient last was seen in November 2016. She is resuming services today as she reports she is having difficulty coping with her husband's death. He died suddenly in July 24, 2016 due to having a heart attack. They were married for 38 years.  Patient reports she has been unable to share or discuss her feelings as she has to be the strong one for the family especially her 63 yo son and her 13 yo granddaughter. The holidays were very difficult and patient avoided family events. She states having no interest or pleasure in activities, She reports poor appetite, fatigue, sleep difficulty, daily crying spells, poor concentration, and memory difficulty.  Suicidal/Homicidal:  Therapist Response: Established rapport, reviewed  symptoms, facilitated patient sharing the narrative of husband's death, discussed mourning rituals in which patient participated, facilitated expression of thoughts and feelings,  normalized feelings associated with grief process, discussed the grief process, provided patient with handout on grief process and stages of grief  Plan: Return again in 1 week.   Diagnosis:Axis I: MDD, GAD  Axis II: No diagnosis

## 2017-05-07 ENCOUNTER — Ambulatory Visit (INDEPENDENT_AMBULATORY_CARE_PROVIDER_SITE_OTHER): Payer: Medicare PPO | Admitting: Psychiatry

## 2017-05-07 ENCOUNTER — Encounter (HOSPITAL_COMMUNITY): Payer: Self-pay | Admitting: Psychiatry

## 2017-05-07 DIAGNOSIS — Z631 Problems in relationship with in-laws: Secondary | ICD-10-CM | POA: Diagnosis not present

## 2017-05-07 DIAGNOSIS — Z634 Disappearance and death of family member: Secondary | ICD-10-CM | POA: Diagnosis not present

## 2017-05-07 DIAGNOSIS — F411 Generalized anxiety disorder: Secondary | ICD-10-CM | POA: Diagnosis not present

## 2017-05-07 DIAGNOSIS — F329 Major depressive disorder, single episode, unspecified: Secondary | ICD-10-CM

## 2017-05-07 DIAGNOSIS — F322 Major depressive disorder, single episode, severe without psychotic features: Secondary | ICD-10-CM

## 2017-05-07 NOTE — Progress Notes (Signed)
   THERAPIST PROGRESS NOTE  Session Time:   Tuesday 05/07/2017 1:12 PM - 2:00 PM  Participation Level: Active  Behavioral Response: Casual/Alert/depressed/tearful   Type of Therapy: Individual Therapy  Treatment Goals addressed:Begin a healthy grieving process around the loss of husband   Interventions: CBT and Supportive  Summary: April Ward is a 57 y.o. female who presents with a history of recurrent symptoms of depression and anxiety. She states she initially became depressed in May of 2014 when she and her sisters began having issues regarding expenses for the family home. 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 Patient reports lifelong perfectionistic tendencies and says anxiety has worsened stating that she tends to think too much. Patient's symptoms included depressed mood, anxiety, racing thoughts, ruminating thoughts, sleep difficulty (2-3 hours of sleep per night) excessive worrying, crying spells, loss of interest in activities, and loss of appetite.   Patient reports continued sleep difficulty, poor appetite, and sadness since last session. She has continued attending church. She eports resuming interest and experiencing pleasure in some activities like walking and working in her flower beds. She also enjoys attending her 83 yo granddaughter's dance competition. Patient continues to miss husband and  being part of a couple. She also admits being hurt by his family members not including her in activities like they once did. She also expresses anger regarding the way her in-laws treated her husband when he was alive.  Suicidal/Homicidal:  Therapist Response:  reviewed symptoms, continued discussing stages of grief, used nondirective technique to allow patient to identify  sources of anger and express feelings of anger regarding her husband's death, discussed the loss of connection with husband's family and effects on patient, assigned patient to continuing reviewing stages of grief and write experiences she has had with each stage, Plan: Return again in 1 week.   Diagnosis:Axis I: MDD, GAD  Axis II: No diagnosis

## 2017-05-09 DIAGNOSIS — H524 Presbyopia: Secondary | ICD-10-CM | POA: Diagnosis not present

## 2017-05-09 DIAGNOSIS — H26493 Other secondary cataract, bilateral: Secondary | ICD-10-CM | POA: Diagnosis not present

## 2017-05-09 DIAGNOSIS — Z961 Presence of intraocular lens: Secondary | ICD-10-CM | POA: Diagnosis not present

## 2017-05-09 DIAGNOSIS — H52203 Unspecified astigmatism, bilateral: Secondary | ICD-10-CM | POA: Diagnosis not present

## 2017-05-09 DIAGNOSIS — H04123 Dry eye syndrome of bilateral lacrimal glands: Secondary | ICD-10-CM | POA: Diagnosis not present

## 2017-05-15 ENCOUNTER — Ambulatory Visit (INDEPENDENT_AMBULATORY_CARE_PROVIDER_SITE_OTHER): Payer: Medicare PPO | Admitting: Psychiatry

## 2017-05-15 ENCOUNTER — Encounter (HOSPITAL_COMMUNITY): Payer: Self-pay | Admitting: Psychiatry

## 2017-05-15 DIAGNOSIS — F322 Major depressive disorder, single episode, severe without psychotic features: Secondary | ICD-10-CM

## 2017-05-15 NOTE — Progress Notes (Signed)
   THERAPIST PROGRESS NOTE  Session Time:   Wednesday 05/15/2017 8:34 AM  - 9:35 AM        Participation Level: Active  Behavioral Response: Casual/Alert/depressed/tearful   Type of Therapy: Individual Therapy  Treatment Goals addressed:Begin a healthy grieving process around the loss of husband   Interventions: CBT and Supportive  Summary: April Ward is a 57 y.o. female who presents with a history of recurrent symptoms of depression and anxiety. She states she initially became depressed in May of 2014 when she and her sisters began having issues regarding expenses for the family home. 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 Patient reports lifelong perfectionistic tendencies and says anxiety has worsened stating that she tends to think too much. Patient's symptoms included depressed mood, anxiety, racing thoughts, ruminating thoughts, sleep difficulty (2-3 hours of sleep per night) excessive worrying, crying spells, loss of interest in activities, and loss of appetite.   Patient reports continued sleep difficulty, poor appetite, and sadness since last session. She has continued attending church. She has been less involved in outside activities due to the [place] weather and reports feeling as though she is slipping back to where she was. She continues to express sadness and hurt regarding regarding the loss of connection with her in-laws. Patient also shares more information about other stressors that occurred last year which affected her ability to grieve the loss of her husband. Patient had to assume responsibility for care for her aunt who has dementia. She recently placed aunt in a rest home. Patient also reports a close friend completed suicide in June 2018. Patient's sister  moved in with her temporarily after patient's husband's death but patient reports this resulted in conflict and having to ask sister to leave inOctober of last year.  Patient reports having limited support other than her children and her niece.   Suicidal/Homicidal: No  Therapist Response:  reviewed symptoms, facilitated expression of thoughts and feelings, discussed stressors patient faced last year and affects on patient, assisted patient identify ways she depended on husband and verbalize and resolve feelings of abandonment and being left alone, discussed other losses patient has experienced, discussed patient's spirituality and ways to use a coping tool   Plan: Return again in 1 week.   Diagnosis:Axis I: MDD, GAD  Axis II: No diagnosis

## 2017-05-24 ENCOUNTER — Ambulatory Visit (HOSPITAL_COMMUNITY): Payer: Self-pay | Admitting: Psychiatry

## 2017-05-29 DIAGNOSIS — H26492 Other secondary cataract, left eye: Secondary | ICD-10-CM | POA: Diagnosis not present

## 2017-05-31 ENCOUNTER — Encounter (HOSPITAL_COMMUNITY): Payer: Self-pay | Admitting: Psychiatry

## 2017-05-31 ENCOUNTER — Ambulatory Visit (INDEPENDENT_AMBULATORY_CARE_PROVIDER_SITE_OTHER): Payer: Medicare PPO | Admitting: Psychiatry

## 2017-05-31 DIAGNOSIS — F322 Major depressive disorder, single episode, severe without psychotic features: Secondary | ICD-10-CM | POA: Diagnosis not present

## 2017-05-31 NOTE — Progress Notes (Signed)
   THERAPIST PROGRESS NOTE  Session Time:   Friday 05/31/2017 9:05 AM - 9:57 AM    Participation Level: Active  Behavioral Response: Casual/Alert/depressed/tearful   Type of Therapy: Individual Therapy  Treatment Goals addressed:Begin a healthy grieving process around the loss of husband   Interventions: CBT and Supportive  Summary: April Ward is a 57 y.o. female who presents with a history of recurrent symptoms of depression and anxiety. She states she initially became depressed in May of 2014 when she and her sisters began having issues regarding expenses for the family home. 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 Patient reports lifelong perfectionistic tendencies and says anxiety has worsened stating that she tends to think too much. Patient's symptoms included depressed mood, anxiety, racing thoughts, ruminating thoughts, sleep difficulty (2-3 hours of sleep per night) excessive worrying, crying spells, loss of interest in activities, and loss of appetite.   Patient reports continued sleep difficulty, poor appetite, and sadness since last session. She states wanting to get back to like she used to be but still having little motivation to do things. She reports she has gone to gym 3 times since last session but has been involved in little other activity. She says she likes being outside on sunny days but just mainly stays in the bed or watch TV on cloudy days. Patient reports little to no structure in her day.  Suicidal/Homicidal: No  Therapist Response:  reviewed symptoms, facilitated expression of thoughts and feelings, discussed patient's values and assisted her identify value congruent activities to increase behavioral activation, praised and reinforced patient's increased  physical activity regarding going to gym, discussed effects on patient's mood/sense of pleasure/accomplishment, assisted patient develop plan to increase and maintain consistency regarding going to gym, introduced and practiced ways to use activity scheduling form, assigned patient to use form daily to plan activities, and bring to next session.    Plan: Return again in 1 week.   Diagnosis:Axis I: MDD, GAD  Axis II: No diagnosis

## 2017-06-07 ENCOUNTER — Ambulatory Visit (HOSPITAL_COMMUNITY): Payer: Self-pay | Admitting: Psychiatry

## 2017-06-12 DIAGNOSIS — H26491 Other secondary cataract, right eye: Secondary | ICD-10-CM | POA: Diagnosis not present

## 2017-06-14 ENCOUNTER — Ambulatory Visit (HOSPITAL_COMMUNITY): Payer: Medicare PPO | Admitting: Psychiatry

## 2017-06-17 ENCOUNTER — Ambulatory Visit (INDEPENDENT_AMBULATORY_CARE_PROVIDER_SITE_OTHER): Payer: Medicare PPO | Admitting: Psychiatry

## 2017-06-17 ENCOUNTER — Encounter (HOSPITAL_COMMUNITY): Payer: Self-pay | Admitting: Psychiatry

## 2017-06-17 VITALS — BP 169/93 | HR 60 | Ht 71.0 in | Wt 217.0 lb

## 2017-06-17 DIAGNOSIS — Z736 Limitation of activities due to disability: Secondary | ICD-10-CM

## 2017-06-17 DIAGNOSIS — D649 Anemia, unspecified: Secondary | ICD-10-CM | POA: Diagnosis not present

## 2017-06-17 DIAGNOSIS — C649 Malignant neoplasm of unspecified kidney, except renal pelvis: Secondary | ICD-10-CM | POA: Diagnosis not present

## 2017-06-17 DIAGNOSIS — Z818 Family history of other mental and behavioral disorders: Secondary | ICD-10-CM

## 2017-06-17 DIAGNOSIS — G4700 Insomnia, unspecified: Secondary | ICD-10-CM | POA: Diagnosis not present

## 2017-06-17 DIAGNOSIS — F322 Major depressive disorder, single episode, severe without psychotic features: Secondary | ICD-10-CM

## 2017-06-17 DIAGNOSIS — Z9884 Bariatric surgery status: Secondary | ICD-10-CM | POA: Diagnosis not present

## 2017-06-17 DIAGNOSIS — Z811 Family history of alcohol abuse and dependence: Secondary | ICD-10-CM

## 2017-06-17 DIAGNOSIS — I129 Hypertensive chronic kidney disease with stage 1 through stage 4 chronic kidney disease, or unspecified chronic kidney disease: Secondary | ICD-10-CM | POA: Diagnosis not present

## 2017-06-17 DIAGNOSIS — R808 Other proteinuria: Secondary | ICD-10-CM | POA: Diagnosis not present

## 2017-06-17 DIAGNOSIS — N2581 Secondary hyperparathyroidism of renal origin: Secondary | ICD-10-CM | POA: Diagnosis not present

## 2017-06-17 DIAGNOSIS — N184 Chronic kidney disease, stage 4 (severe): Secondary | ICD-10-CM | POA: Diagnosis not present

## 2017-06-17 DIAGNOSIS — N183 Chronic kidney disease, stage 3 (moderate): Secondary | ICD-10-CM | POA: Diagnosis not present

## 2017-06-17 MED ORDER — PRAZOSIN HCL 5 MG PO CAPS: 5.0000 mg | ORAL_CAPSULE | Freq: Every day | ORAL | 2 refills | Status: DC

## 2017-06-17 MED ORDER — TEMAZEPAM 30 MG PO CAPS: 30.0000 mg | ORAL_CAPSULE | Freq: Every evening | ORAL | 2 refills | Status: DC | PRN

## 2017-06-17 MED ORDER — QUETIAPINE FUMARATE 100 MG PO TABS: 100.0000 mg | ORAL_TABLET | Freq: Every day | ORAL | 2 refills | Status: DC

## 2017-06-17 MED ORDER — BUPROPION HCL ER (XL) 300 MG PO TB24: 300.0000 mg | ORAL_TABLET | ORAL | 2 refills | Status: DC

## 2017-06-17 MED ORDER — ALPRAZOLAM 1 MG PO TABS: 1.0000 mg | ORAL_TABLET | Freq: Four times a day (QID) | ORAL | 2 refills | Status: DC

## 2017-06-17 NOTE — Progress Notes (Signed)
[facility] MD/PA/NP OP Progress Note  06/17/2017 11:01 AM DELLENE MCGROARTY  MRN:  [id number]  Chief Complaint:  Chief Complaint    Depression; Anxiety; Follow-up     HPI: This patient is a 57 year old widowed black female who lives with her son in [place], [place].  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  The patient returns after 2 months.  She states that she is doing somewhat better.  This month marks the one-year anniversary of her husband's death and she is still struggling with this.  She wakes up at night calling his name and does not even know what she dreamed about.  She does not remember any dreams or nightmares.  Last time we cut down her Tegretol and she has not noticed much difference so I think we can go ahead and stop it.  She is up every night around 2 AM and cannot get back to sleep so we will increase her Restoril.  Her mood is somewhat up and down but to her credit she is doing more trying to exercise and has lost 12 more pounds.  She is in therapy here with Maurice Small which she finds very helpful.  She still thinks the medications are helping.  She is working with her physician to get her thyroid better regulated. Visit Diagnosis:    ICD-10-CM   1. Major depressive disorder, single episode, severe without psychotic features ([place]) F32.2     Past Psychiatric History: none  Past Medical History:  Past Medical History:  Diagnosis Date  . Anxiety   . Arthritis   . Depression   . Diabetes mellitus without complication ([place])   . GERD (gastroesophageal reflux disease)   . Gout   . Heart murmur   . Hypertension   . Renal cancer ([place])   . Renal disease 10/2012  . Renal insufficiency     Past Surgical History:  Procedure Laterality Date  . CESAREAN SECTION  [id number]  . CHONDROPLASTY Right 08/22/2012   Procedure: CHONDROPLASTY;  Surgeon: Carole Civil, MD;  Location: [facility];  Service: Orthopedics;  Laterality: Right;  . COLONOSCOPY WITH PROPOFOL  N/A 03/16/2014   Procedure: COLONOSCOPY WITH PROPOFOL (at cecum 1023, total withdrawal time=9 minutes);  Surgeon: Danie Binder, MD;  Location: [facility];  Service: Endoscopy;  Laterality: N/A;  . GASTRIC BYPASS    . KNEE ARTHROSCOPY WITH MEDIAL MENISECTOMY Right 08/22/2012   Procedure: KNEE ARTHROSCOPY WITH PARTIAL MEDIAL MENISECTOMY;  Surgeon: Carole Civil, MD;  Location: [facility];  Service: Orthopedics;  Laterality: Right;  . PARTIAL NEPHRECTOMY  Dec 2014   left  . TENDON REPAIR      Family Psychiatric History: See below  Family History:  Family History  Problem Relation Age of Onset  . Heart attack Mother   . Heart attack Father   . Depression Paternal Aunt   . Alcohol abuse Maternal Uncle   . Colon cancer Maternal Aunt   . Colon cancer Maternal Uncle     Social History:  Social History   Socioeconomic History  . Marital status: Married    Spouse name: None  . Number of children: None  . Years of education: None  . Highest education level: None  Social Needs  . Financial resource strain: None  . Food insecurity - worry: None  . Food insecurity - inability: None  . Transportation needs - medical: None  . Transportation needs - non-medical: None  Occupational History  .  Occupation: Visual merchandiser: [employer]  Tobacco Use  . Smoking status: Never Smoker  . Smokeless tobacco: Never Used  Substance and Sexual Activity  . Alcohol use: No    Alcohol/week: 0.0 oz  . Drug use: No  . Sexual activity: None  Other Topics Concern  . None  Social History Narrative  . None    Allergies:  Allergies  Allergen Reactions  . Skelaxin [Metaxalone] Hives and Rash    Metabolic Disorder Labs: No results found for: HGBA1C, MPG No results found for: PROLACTIN Lab Results  Component Value Date   CHOL 215 (H) 03/11/2014   TRIG 288 (H) 03/11/2014   HDL 33 (L) 03/11/2014   CHOLHDL 6.5 03/11/2014   VLDL 58 (H) 03/11/2014   LDLCALC 124 (H)  03/11/2014   No results found for: TSH  Therapeutic Level Labs: No results found for: LITHIUM No results found for: VALPROATE No components found for:  CBMZ  Current Medications: Current Outpatient Medications  Medication Sig Dispense Refill  . ALPRAZolam (XANAX) 1 MG tablet Take 1 tablet (1 mg total) by mouth 4 (four) times daily. 120 tablet 2  . amLODipine (NORVASC) 5 MG tablet Take 1 tablet (5 mg total) by mouth daily. 90 tablet 3  . buPROPion (WELLBUTRIN XL) 300 MG 24 hr tablet Take 1 tablet (300 mg total) by mouth every morning. 60 tablet 2  . calcium citrate-vitamin D 500-400 MG-UNIT chewable tablet Chew 1 tablet by mouth 3 (three) times daily.    . furosemide (LASIX) 40 MG tablet Take 40 mg by mouth as needed.    Marland Kitchen levothyroxine (SYNTHROID, LEVOTHROID) 200 MCG tablet Take 200 mcg by mouth daily before breakfast.    . losartan (COZAAR) 50 MG tablet Take 100 mg by mouth daily.     . meclizine (ANTIVERT) 25 MG tablet Take 1 tablet (25 mg total) by mouth every 8 (eight) hours as needed for dizziness. 90 tablet 0  . Multiple Vitamin (MULTIVITAMIN) tablet Take 1 tablet by mouth 3 (three) times daily.     . naltrexone (DEPADE) 50 MG tablet     . nystatin (MYCOSTATIN/NYSTOP) powder as needed.    Marland Kitchen omeprazole (PRILOSEC) 20 MG capsule Take 20 mg by mouth daily.    . prazosin (MINIPRESS) 5 MG capsule Take 1 capsule (5 mg total) by mouth at bedtime. 90 capsule 2  . QUEtiapine (SEROQUEL) 100 MG tablet Take 1 tablet (100 mg total) by mouth at bedtime. 90 tablet 2  . vitamin B-12 (CYANOCOBALAMIN) 500 MCG tablet Take 500 mcg by mouth daily.    . temazepam (RESTORIL) 30 MG capsule Take 1 capsule (30 mg total) by mouth at bedtime as needed for sleep. 30 capsule 2   No current facility-administered medications for this visit.      Musculoskeletal: Strength & Muscle Tone: within normal limits Gait & Station: normal Patient leans: N/A  Psychiatric Specialty Exam: Review of Systems   Psychiatric/Behavioral: The patient has insomnia.   All other systems reviewed and are negative.   Blood pressure (!) 169/93, pulse 60, height 5\' 11"  (1.803 m), weight 217 lb (98.4 kg), SpO2 100 %.Body mass index is 30.27 kg/m.  General Appearance: Casual and Fairly Groomed  Eye Contact:  Good  Speech:  Clear and Coherent  Volume:  Normal  Mood:  Dysphoric  Affect:  Appropriate  Thought Process:  Goal Directed  Orientation:  Full (Time, Place, and Person)  Thought Content: Rumination   Suicidal Thoughts:  No  Homicidal Thoughts:  No  Memory:  Immediate;   Good Recent;   Good Remote;   Good  Judgement:  Good  Insight:  Good  Psychomotor Activity:  Normal  Concentration:  Concentration: Good and Attention Span: Good  Recall:  Good  Fund of Knowledge: Good  Language: Good  Akathisia:  No  Handed:  Right  AIMS (if indicated): not done  Assets:  Communication Skills Desire for Improvement Resilience Social Support Talents/Skills  ADL's:  Intact  Cognition: WNL  Sleep:  Poor   Screenings:   Assessment and Plan: This patient is a 57 year old female with a history of depression and anxiety.  She used to have severe mood swings but it seems more related to her situation and her former job.  We are slowly getting her off Tegretol and she is at a point where it can be discontinued.  She will continue Restoril for sleep but increase the dosage to 30 mg at bedtime.  She will continue Seroquel 100 mill grams at bedtime for mood stabilization prazosin 5 mg at bedtime to prevent nightmares and Wellbutrin XL 300 mg daily for depression and Xanax 1 mg up to 4 times a day for anxiety.  She will continue her therapy and return to see me in 2 months   [name], MD 06/17/2017, 11:01 AM

## 2017-06-21 ENCOUNTER — Ambulatory Visit (HOSPITAL_COMMUNITY): Payer: Self-pay | Admitting: Psychiatry

## 2017-06-28 ENCOUNTER — Ambulatory Visit (HOSPITAL_COMMUNITY): Payer: Medicare PPO | Admitting: Psychiatry

## 2017-07-05 ENCOUNTER — Encounter (HOSPITAL_COMMUNITY): Payer: Self-pay | Admitting: Psychiatry

## 2017-07-05 ENCOUNTER — Ambulatory Visit (HOSPITAL_COMMUNITY): Payer: Self-pay | Admitting: Psychiatry

## 2017-07-05 ENCOUNTER — Ambulatory Visit (INDEPENDENT_AMBULATORY_CARE_PROVIDER_SITE_OTHER): Payer: Medicare PPO | Admitting: Psychiatry

## 2017-07-05 DIAGNOSIS — F411 Generalized anxiety disorder: Secondary | ICD-10-CM

## 2017-07-05 DIAGNOSIS — F322 Major depressive disorder, single episode, severe without psychotic features: Secondary | ICD-10-CM
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
## 2017-07-15 ENCOUNTER — Other Ambulatory Visit (HOSPITAL_COMMUNITY): Payer: Self-pay | Admitting: Obstetrics and Gynecology

## 2017-07-15 ENCOUNTER — Ambulatory Visit (HOSPITAL_COMMUNITY)
Admission: RE | Admit: 2017-07-15 | Discharge: 2017-07-15 | Disposition: A | Discharge status: Home / Self Care | Payer: Medicare PPO | Source: Ambulatory Visit | Attending: Obstetrics and Gynecology | Admitting: Obstetrics and Gynecology

## 2017-07-15 DIAGNOSIS — Z1231 Encounter for screening mammogram for malignant neoplasm of breast: Secondary | ICD-10-CM

## 2017-07-16 DIAGNOSIS — Z0000 Encounter for general adult medical examination without abnormal findings: Secondary | ICD-10-CM | POA: Diagnosis not present

## 2017-07-16 DIAGNOSIS — Z01419 Encounter for gynecological examination (general) (routine) without abnormal findings: Secondary | ICD-10-CM | POA: Diagnosis not present

## 2017-07-18 ENCOUNTER — Ambulatory Visit: Payer: Medicare PPO | Admitting: Orthopedic Surgery

## 2017-07-18 ENCOUNTER — Encounter: Payer: Self-pay | Admitting: Orthopedic Surgery

## 2017-07-18 NOTE — Progress Notes (Deleted)
Patient ID: April Ward, female   DOB: Jun 07, 1960, 57 y.o.   MRN: [id number]  No chief complaint on file.   MEDICAL DECISION SECTION  xrays ordered? ***  My independent reading of xrays: ***  X-rays from several years ago: FINDINGS CLINICAL DATA:  LOW BACK AND LEFT HIP PAIN. LUMBAR SPINE COMPLETE THERE ARE FIVE LUMBAR TYPE VERTEBRAE PRESENT.  DEGENERATIVE DISK SPACE NARROWING IS SEEN AT THE L3- 4 AND L4-5 LEVELS.  THERE ARE NO FRACTURES, SUBLUXATIONS OR DESTRUCTIVE CHANGES. IMPRESSION DEGENERATIVE DISK SPACE NARROWING NOTED AT THE L3-4 AND L4-5 LEVELS.  OTHERWISE, NORMAL STUDY. LEFT HIP 2 VIEWS THE LEFT HIP JOINT SPACE IS NORMALLY MAINTAINED.  THERE ARE NO EROSIVE OR DESTRUCTIVE CHANGES.  THE BONES APPEAR INTRINSICALLY NORMAL. IMPRESSION NORMAL VIEWS OF THE LEFT HIP.  No diagnosis found.   PLAN:   No orders of the defined types were placed in this encounter.  Injection? *** MRI/CT/? ***    HPI April Ward is a 58 y.o. female.  ***  Review of Systems Review of Systems  Past Medical History:  Diagnosis Date  . Anxiety   . Arthritis   . Depression   . Diabetes mellitus without complication ([place])   . GERD (gastroesophageal reflux disease)   . Gout   . Heart murmur   . Hypertension   . Renal cancer ([place])   . Renal disease 10/2012  . Renal insufficiency     Past Surgical History:  Procedure Laterality Date  . CESAREAN SECTION  [id number]  . CHONDROPLASTY Right 08/22/2012   Procedure: CHONDROPLASTY;  Surgeon: Carole Civil, MD;  Location: [facility];  Service: Orthopedics;  Laterality: Right;  . COLONOSCOPY WITH PROPOFOL N/A 03/16/2014   Procedure: COLONOSCOPY WITH PROPOFOL (at cecum 1023, total withdrawal time=9 minutes);  Surgeon: Danie Binder, MD;  Location: [facility];  Service: Endoscopy;  Laterality: N/A;  . GASTRIC BYPASS    . KNEE ARTHROSCOPY WITH MEDIAL MENISECTOMY Right 08/22/2012   Procedure: KNEE ARTHROSCOPY WITH PARTIAL MEDIAL MENISECTOMY;  Surgeon:  Carole Civil, MD;  Location: [facility];  Service: Orthopedics;  Laterality: Right;  . PARTIAL NEPHRECTOMY  Dec 2014   left  . TENDON REPAIR      Family History  Problem Relation Age of Onset  . Heart attack Mother   . Heart attack Father   . Depression Paternal Aunt   . Alcohol abuse Maternal Uncle   . Colon cancer Maternal Aunt   . Colon cancer Maternal Uncle    was reviewed  Social History Social History   Tobacco Use  . Smoking status: Never Smoker  . Smokeless tobacco: Never Used  Substance Use Topics  . Alcohol use: No    Alcohol/week: 0.0 oz  . Drug use: No    Allergies  Allergen Reactions  . Skelaxin [Metaxalone] Hives and Rash    Current Outpatient Medications  Medication Sig Dispense Refill  . ALPRAZolam (XANAX) 1 MG tablet Take 1 tablet (1 mg total) by mouth 4 (four) times daily. 120 tablet 2  . amLODipine (NORVASC) 5 MG tablet Take 1 tablet (5 mg total) by mouth daily. 90 tablet 3  . buPROPion (WELLBUTRIN XL) 300 MG 24 hr tablet Take 1 tablet (300 mg total) by mouth every morning. 60 tablet 2  . calcium citrate-vitamin D 500-400 MG-UNIT chewable tablet Chew 1 tablet by mouth 3 (three) times daily.    . furosemide (LASIX) 40 MG tablet Take 40 mg by mouth as needed.    Marland Kitchen  levothyroxine (SYNTHROID, LEVOTHROID) 200 MCG tablet Take 200 mcg by mouth daily before breakfast.    . losartan (COZAAR) 50 MG tablet Take 100 mg by mouth daily.     . meclizine (ANTIVERT) 25 MG tablet Take 1 tablet (25 mg total) by mouth every 8 (eight) hours as needed for dizziness. 90 tablet 0  . Multiple Vitamin (MULTIVITAMIN) tablet Take 1 tablet by mouth 3 (three) times daily.     . naltrexone (DEPADE) 50 MG tablet     . nystatin (MYCOSTATIN/NYSTOP) powder as needed.    Marland Kitchen omeprazole (PRILOSEC) 20 MG capsule Take 20 mg by mouth daily.    . prazosin (MINIPRESS) 5 MG capsule Take 1 capsule (5 mg total) by mouth at bedtime. 90 capsule 2  . QUEtiapine (SEROQUEL) 100 MG tablet Take 1  tablet (100 mg total) by mouth at bedtime. 90 tablet 2  . temazepam (RESTORIL) 30 MG capsule Take 1 capsule (30 mg total) by mouth at bedtime as needed for sleep. 30 capsule 2  . vitamin B-12 (CYANOCOBALAMIN) 500 MCG tablet Take 500 mcg by mouth daily.     No current facility-administered medications for this visit.        Physical Exam There were no vitals taken for this visit. Physical Exam Gen. appearance: The patient is well-developed and well-nourished grooming and hygiene are normal The patient is oriented to person place and time The patient's mood is normal and the affect is normal  Ortho Exam Gait assessment: The patient stands with normal gait and station  Lumbar spine Tenderness  to palpation is noted in the lower L4-5 and 5 S1 segment  Range of motion  *** Muscle tone normal muscle tone on the right and left sides of the spine  Lower extremities right and left Normal range of motion hip knee and ankle All 3 joints are reduced and stable  Strength right lower extremity *** Strength left lower extremity ***  Neurologic right lower extremity examination  Reflexes were 2+ and equal at the knee and 1+ and equal at the ankle    Sensation was normal in both feet and legs    Babinski's tests were down going  Straight leg raise testing was***  normal bilaterally  The vascular examination revealed normal dorsalis pedis pulses in both feet and both feet were warm with good capillary refill

## 2017-08-14 ENCOUNTER — Ambulatory Visit (HOSPITAL_COMMUNITY): Payer: Self-pay | Admitting: Psychiatry

## 2017-08-19 ENCOUNTER — Ambulatory Visit (INDEPENDENT_AMBULATORY_CARE_PROVIDER_SITE_OTHER): Payer: Medicare PPO | Admitting: Psychiatry

## 2017-08-19 ENCOUNTER — Encounter (HOSPITAL_COMMUNITY): Payer: Self-pay | Admitting: Psychiatry

## 2017-08-19 VITALS — BP 182/90 | HR 62 | Ht 71.0 in | Wt 225.0 lb

## 2017-08-19 DIAGNOSIS — Z818 Family history of other mental and behavioral disorders: Secondary | ICD-10-CM

## 2017-08-19 DIAGNOSIS — G4700 Insomnia, unspecified: Secondary | ICD-10-CM | POA: Diagnosis not present

## 2017-08-19 DIAGNOSIS — F411 Generalized anxiety disorder: Secondary | ICD-10-CM

## 2017-08-19 DIAGNOSIS — Z634 Disappearance and death of family member: Secondary | ICD-10-CM | POA: Diagnosis not present

## 2017-08-19 DIAGNOSIS — Z736 Limitation of activities due to disability: Secondary | ICD-10-CM

## 2017-08-19 DIAGNOSIS — Z811 Family history of alcohol abuse and dependence: Secondary | ICD-10-CM

## 2017-08-19 DIAGNOSIS — F322 Major depressive disorder, single episode, severe without psychotic features: Secondary | ICD-10-CM

## 2017-08-19 MED ORDER — PRAZOSIN HCL 5 MG PO CAPS: 5.0000 mg | ORAL_CAPSULE | Freq: Every day | ORAL | 2 refills | Status: DC

## 2017-08-19 MED ORDER — TRAZODONE HCL 100 MG PO TABS: 100.0000 mg | ORAL_TABLET | Freq: Every day | ORAL | 2 refills | Status: DC

## 2017-08-19 MED ORDER — ALPRAZOLAM 1 MG PO TABS
1.0000 mg | ORAL_TABLET | Freq: Four times a day (QID) | ORAL | 2 refills | Status: DC
Start: 2017-08-19 — End: 2017-10-16

## 2017-08-19 MED ORDER — BUPROPION HCL ER (XL) 300 MG PO TB24: 300.0000 mg | ORAL_TABLET | ORAL | 2 refills | Status: DC

## 2017-08-19 NOTE — Progress Notes (Signed)
[facility] MD/PA/NP OP Progress Note  08/19/2017 10:55 AM April Ward  MRN:  [id number]  Chief Complaint:  Chief Complaint    Depression; Anxiety; Follow-up     HPI: This patient is a 57 year old widowed black female who lives with her son and asked in [place].  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  The patient returns for follow-up of depression and anxiety.  This past March mark the one year anniversary of her husband's sudden death from a heart attack.  She still struggling and misses him a lot.  She still goes out with some of the same couples the used to go with as a couple themselves.  She went to a restaurant with them yesterday and this was very difficult.  She is not sleeping well and therefore her energy is poor.  The Restoril and Seroquel at night are not really helping.  She is trying to continue to exercise and be active in her garden.  She still active with her granddaughter going to dance competitions.  She is now looking at being evaluated for kidney transplant in the future.  She was tearful and somewhat upset today.  Yesterday was a very hard day for her but she denies being suicidal.  At times however she wishes "I could just go." Visit Diagnosis:    ICD-10-CM   1. Major depressive disorder, single episode, severe without psychotic features ([place]) F32.2   2. GAD (generalized anxiety disorder) F41.1     Past Psychiatric History: none  Past Medical History:  Past Medical History:  Diagnosis Date  . Anxiety   . Arthritis   . Depression   . Diabetes mellitus without complication ([place])   . GERD (gastroesophageal reflux disease)   . Gout   . Heart murmur   . Hypertension   . Renal cancer ([place])   . Renal disease 10/2012  . Renal insufficiency     Past Surgical History:  Procedure Laterality Date  . CESAREAN SECTION  [id number]  . CHONDROPLASTY Right 08/22/2012   Procedure: CHONDROPLASTY;  Surgeon: Carole Civil, MD;  Location: [facility];   Service: Orthopedics;  Laterality: Right;  . COLONOSCOPY WITH PROPOFOL N/A 03/16/2014   Procedure: COLONOSCOPY WITH PROPOFOL (at cecum 1023, total withdrawal time=9 minutes);  Surgeon: Danie Binder, MD;  Location: [facility];  Service: Endoscopy;  Laterality: N/A;  . GASTRIC BYPASS    . KNEE ARTHROSCOPY WITH MEDIAL MENISECTOMY Right 08/22/2012   Procedure: KNEE ARTHROSCOPY WITH PARTIAL MEDIAL MENISECTOMY;  Surgeon: Carole Civil, MD;  Location: [facility];  Service: Orthopedics;  Laterality: Right;  . PARTIAL NEPHRECTOMY  Dec 2014   left  . TENDON REPAIR      Family Psychiatric History: See below  Family History:  Family History  Problem Relation Age of Onset  . Heart attack Mother   . Heart attack Father   . Depression Paternal Aunt   . Alcohol abuse Maternal Uncle   . Colon cancer Maternal Aunt   . Colon cancer Maternal Uncle     Social History:  Social History   Socioeconomic History  . Marital status: Married    Spouse name: Not on file  . Number of children: Not on file  . Years of education: Not on file  . Highest education level: Not on file  Occupational History  . Occupation: Visual merchandiser: [employer]  Social Needs  . Financial resource strain: Not on file  .  Food insecurity:    Worry: Not on file    Inability: Not on file  . Transportation needs:    Medical: Not on file    Non-medical: Not on file  Tobacco Use  . Smoking status: Never Smoker  . Smokeless tobacco: Never Used  Substance and Sexual Activity  . Alcohol use: No    Alcohol/week: 0.0 oz  . Drug use: No  . Sexual activity: Not on file  Lifestyle  . Physical activity:    Days per week: Not on file    Minutes per session: Not on file  . Stress: Not on file  Relationships  . Social connections:    Talks on phone: Not on file    Gets together: Not on file    Attends religious service: Not on file    Active member of club or organization: Not on file    Attends  meetings of clubs or organizations: Not on file    Relationship status: Not on file  Other Topics Concern  . Not on file  Social History Narrative  . Not on file    Allergies:  Allergies  Allergen Reactions  . Skelaxin [Metaxalone] Hives and Rash    Metabolic Disorder Labs: No results found for: HGBA1C, MPG No results found for: PROLACTIN Lab Results  Component Value Date   CHOL 215 (H) 03/11/2014   TRIG 288 (H) 03/11/2014   HDL 33 (L) 03/11/2014   CHOLHDL 6.5 03/11/2014   VLDL 58 (H) 03/11/2014   LDLCALC 124 (H) 03/11/2014   No results found for: TSH  Therapeutic Level Labs: No results found for: LITHIUM No results found for: VALPROATE No components found for:  CBMZ  Current Medications: Current Outpatient Medications  Medication Sig Dispense Refill  . ALPRAZolam (XANAX) 1 MG tablet Take 1 tablet (1 mg total) by mouth 4 (four) times daily. 120 tablet 2  . amLODipine (NORVASC) 5 MG tablet Take 1 tablet (5 mg total) by mouth daily. 90 tablet 3  . buPROPion (WELLBUTRIN XL) 300 MG 24 hr tablet Take 1 tablet (300 mg total) by mouth every morning. 60 tablet 2  . calcium citrate-vitamin D 500-400 MG-UNIT chewable tablet Chew 1 tablet by mouth 3 (three) times daily.    . furosemide (LASIX) 40 MG tablet Take 40 mg by mouth as needed.    Marland Kitchen levothyroxine (SYNTHROID, LEVOTHROID) 200 MCG tablet Take 200 mcg by mouth daily before breakfast.    . losartan (COZAAR) 50 MG tablet Take 100 mg by mouth daily.     . meclizine (ANTIVERT) 25 MG tablet Take 1 tablet (25 mg total) by mouth every 8 (eight) hours as needed for dizziness. 90 tablet 0  . Multiple Vitamin (MULTIVITAMIN) tablet Take 1 tablet by mouth 3 (three) times daily.     . naltrexone (DEPADE) 50 MG tablet     . nystatin (MYCOSTATIN/NYSTOP) powder as needed.    Marland Kitchen omeprazole (PRILOSEC) 20 MG capsule Take 20 mg by mouth daily.    . prazosin (MINIPRESS) 5 MG capsule Take 1 capsule (5 mg total) by mouth at bedtime. 90 capsule 2   . vitamin B-12 (CYANOCOBALAMIN) 500 MCG tablet Take 500 mcg by mouth daily.    . traZODone (DESYREL) 100 MG tablet Take 1 tablet (100 mg total) by mouth at bedtime. 30 tablet 2   No current facility-administered medications for this visit.      Musculoskeletal: Strength & Muscle Tone: within normal limits Gait & Station: normal Patient leans: N/A  Psychiatric Specialty Exam: Review of Systems  Constitutional: Positive for malaise/fatigue.  Psychiatric/Behavioral: Positive for depression. The patient has insomnia.   All other systems reviewed and are negative.   Blood pressure (!) 182/90, pulse 62, height 5\' 11"  (1.803 m), weight 225 lb (102.1 kg), SpO2 100 %.Body mass index is 31.38 kg/m.  General Appearance: Casual, Neat and Well Groomed  Eye Contact:  Fair  Speech:  Clear and Coherent  Volume:  Decreased  Mood:  Depressed  Affect:  Constricted and Tearful  Thought Process:  Goal Directed  Orientation:  Full (Time, Place, and Person)  Thought Content: Rumination   Suicidal Thoughts:  No  Homicidal Thoughts:  No  Memory:  Immediate;   Good Recent;   Good Remote;   Good  Judgement:  Fair  Insight:  Fair  Psychomotor Activity:  Decreased  Concentration:  Concentration: Fair and Attention Span: Fair  Recall:  Good  Fund of Knowledge: Good  Language: Good  Akathisia:  No  Handed:  Right  AIMS (if indicated): not done  Assets:  Communication Skills Desire for Improvement Resilience Social Support Talents/Skills Transportation  ADL's:  Intact  Cognition: WNL  Sleep:  Poor   Screenings:   Assessment and Plan: This patient is a 57 year old female with a history of depression and anxiety.  Her symptoms have been worsened by the recent death of her husband.  She is still struggling with this loss.  Fortunately she is in counseling here with Maurice Small which has been helpful.  I also suggested a hospice support group.  For now however she will discontinue Seroquel and  Restoril and start trazodone 100 mg at bedtime.  She will continue prazosin 5 mg at bedtime for nightmares, Wellbutrin XL 300 mg daily for depression and Xanax 1 mg 4 times daily for anxiety.  She will return to see me in 6 weeks   [name], MD 08/19/2017, 10:55 AM

## 2017-08-22 DIAGNOSIS — N184 Chronic kidney disease, stage 4 (severe): Secondary | ICD-10-CM | POA: Diagnosis not present

## 2017-08-27 DIAGNOSIS — R21 Rash and other nonspecific skin eruption: Secondary | ICD-10-CM | POA: Diagnosis not present

## 2017-08-27 DIAGNOSIS — N184 Chronic kidney disease, stage 4 (severe): Secondary | ICD-10-CM | POA: Diagnosis not present

## 2017-08-27 DIAGNOSIS — E1122 Type 2 diabetes mellitus with diabetic chronic kidney disease: Secondary | ICD-10-CM | POA: Diagnosis not present

## 2017-08-27 DIAGNOSIS — I10 Essential (primary) hypertension: Secondary | ICD-10-CM | POA: Diagnosis not present

## 2017-08-30 ENCOUNTER — Ambulatory Visit (HOSPITAL_COMMUNITY): Payer: Medicare PPO | Admitting: Psychiatry

## 2017-09-11 DIAGNOSIS — N184 Chronic kidney disease, stage 4 (severe): Secondary | ICD-10-CM | POA: Diagnosis not present

## 2017-09-13 ENCOUNTER — Ambulatory Visit (HOSPITAL_COMMUNITY): Payer: Medicare PPO | Admitting: Psychiatry

## 2017-09-24 ENCOUNTER — Encounter (HOSPITAL_COMMUNITY): Payer: Self-pay | Admitting: Psychiatry

## 2017-09-24 DIAGNOSIS — Z6830 Body mass index (BMI) 30.0-30.9, adult: Secondary | ICD-10-CM | POA: Diagnosis not present

## 2017-09-24 DIAGNOSIS — Z85528 Personal history of other malignant neoplasm of kidney: Secondary | ICD-10-CM | POA: Diagnosis not present

## 2017-09-24 DIAGNOSIS — N289 Disorder of kidney and ureter, unspecified: Secondary | ICD-10-CM | POA: Diagnosis not present

## 2017-09-24 DIAGNOSIS — C642 Malignant neoplasm of left kidney, except renal pelvis: Secondary | ICD-10-CM | POA: Diagnosis not present

## 2017-09-24 DIAGNOSIS — N200 Calculus of kidney: Secondary | ICD-10-CM | POA: Diagnosis not present

## 2017-09-24 DIAGNOSIS — Z905 Acquired absence of kidney: Secondary | ICD-10-CM | POA: Diagnosis not present

## 2017-09-24 DIAGNOSIS — E6609 Other obesity due to excess calories: Secondary | ICD-10-CM | POA: Diagnosis not present

## 2017-09-24 NOTE — Progress Notes (Signed)
Outpatient Therapist Discharge Summary  April Ward    18-Dec-1960   Admission Date: 04/13/2013  Discharge Date:  09/24/2017 Reason for Discharge:  No contact in 60 days, excessive no shows Diagnosis:  Axis I:  Major Depressive Disorder, Recurrent, Moderate    Comments:   [name]

## 2017-09-25 DIAGNOSIS — Z9884 Bariatric surgery status: Secondary | ICD-10-CM | POA: Diagnosis not present

## 2017-09-27 ENCOUNTER — Ambulatory Visit (HOSPITAL_COMMUNITY): Payer: Medicare PPO | Admitting: Psychiatry

## 2017-09-30 ENCOUNTER — Ambulatory Visit (HOSPITAL_COMMUNITY): Payer: Medicare PPO | Admitting: Psychiatry

## 2017-09-30 DIAGNOSIS — L304 Erythema intertrigo: Secondary | ICD-10-CM | POA: Diagnosis not present

## 2017-09-30 DIAGNOSIS — L219 Seborrheic dermatitis, unspecified: Secondary | ICD-10-CM | POA: Diagnosis not present

## 2017-10-16 ENCOUNTER — Ambulatory Visit (INDEPENDENT_AMBULATORY_CARE_PROVIDER_SITE_OTHER): Payer: Medicare PPO | Admitting: Psychiatry

## 2017-10-16 ENCOUNTER — Encounter (HOSPITAL_COMMUNITY): Payer: Self-pay | Admitting: Psychiatry

## 2017-10-16 VITALS — BP 160/91 | HR 60 | Resp 16 | Wt 221.8 lb

## 2017-10-16 DIAGNOSIS — Z818 Family history of other mental and behavioral disorders: Secondary | ICD-10-CM

## 2017-10-16 DIAGNOSIS — Z811 Family history of alcohol abuse and dependence: Secondary | ICD-10-CM

## 2017-10-16 DIAGNOSIS — F411 Generalized anxiety disorder: Secondary | ICD-10-CM

## 2017-10-16 DIAGNOSIS — F322 Major depressive disorder, single episode, severe without psychotic features: Secondary | ICD-10-CM | POA: Diagnosis not present

## 2017-10-16 DIAGNOSIS — Z634 Disappearance and death of family member: Secondary | ICD-10-CM | POA: Diagnosis not present

## 2017-10-16 MED ORDER — TEMAZEPAM 30 MG PO CAPS: 30.0000 mg | ORAL_CAPSULE | Freq: Every evening | ORAL | 0 refills | Status: DC | PRN

## 2017-10-16 MED ORDER — BUPROPION HCL ER (XL) 300 MG PO TB24
300.0000 mg | ORAL_TABLET | ORAL | 2 refills | Status: DC
Start: 2017-10-16 — End: 2017-12-17

## 2017-10-16 MED ORDER — ALPRAZOLAM 1 MG PO TABS: 1.0000 mg | ORAL_TABLET | Freq: Four times a day (QID) | ORAL | 2 refills | Status: DC

## 2017-10-16 MED ORDER — PRAZOSIN HCL 5 MG PO CAPS: 5.0000 mg | ORAL_CAPSULE | Freq: Every day | ORAL | 2 refills | Status: DC

## 2017-10-16 NOTE — Progress Notes (Signed)
[place] MD/PA/NP OP Progress Note  10/16/2017 9:43 AM ACASIA SKILTON  MRN:  [id number]  Chief Complaint:  Chief Complaint    Depression; Anxiety; Follow-up     HPI: This patient is a 57 year old widowed black female who lives with her son a in [place].  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  The patient returns after 2 months for follow-up of depression and anxiety.  She is still struggling with her husband's sudden death of a heart attack in 07/18/2016.  She still misses him terribly.  She states that she is tired of people at church and others asking her why she has not gotten over it.  She still has all his clothing in the room and she and her son are not ready to let go of his things.  She spends a lot of time in her garden.  He gave her most of the flowers that are in the garden it makes her feel at peace.  Last time she was not sleeping well and we started trazodone but it actually made her feel worse and she was even having some suicidal thoughts.  She is no longer having these she is stop the trazodone.  She states that she slept better on Restoril.  She is not having nightmares with the prazosin.  She thinks her antidepressant is working but she is still struggling with chronic grief and admits that she is probably is too isolated but does not feel ready to be around that many people.  She is no longer in therapy here and I urged her to find another therapist. Visit Diagnosis:    ICD-10-CM   1. Major depressive disorder, single episode, severe without psychotic features ([place]) F32.2     Past Psychiatric History: none  Past Medical History:  Past Medical History:  Diagnosis Date  . Anxiety   . Arthritis   . Depression   . Diabetes mellitus without complication ([place])   . GERD (gastroesophageal reflux disease)   . Gout   . Heart murmur   . Hypertension   . Renal cancer ([place])   . Renal disease 10/2012  . Renal insufficiency     Past Surgical  History:  Procedure Laterality Date  . CESAREAN SECTION  [id number]  . CHONDROPLASTY Right 08/22/2012   Procedure: CHONDROPLASTY;  Surgeon: Carole Civil, MD;  Location: [facility];  Service: Orthopedics;  Laterality: Right;  . COLONOSCOPY WITH PROPOFOL N/A 03/16/2014   Procedure: COLONOSCOPY WITH PROPOFOL (at cecum 1023, total withdrawal time=9 minutes);  Surgeon: Danie Binder, MD;  Location: [facility];  Service: Endoscopy;  Laterality: N/A;  . GASTRIC BYPASS    . KNEE ARTHROSCOPY WITH MEDIAL MENISECTOMY Right 08/22/2012   Procedure: KNEE ARTHROSCOPY WITH PARTIAL MEDIAL MENISECTOMY;  Surgeon: Carole Civil, MD;  Location: [facility];  Service: Orthopedics;  Laterality: Right;  . PARTIAL NEPHRECTOMY  Dec 2014   left  . TENDON REPAIR      Family Psychiatric History: See below  Family History:  Family History  Problem Relation Age of Onset  . Heart attack Mother   . Heart attack Father   . Depression Paternal Aunt   . Alcohol abuse Maternal Uncle   . Colon cancer Maternal Aunt   . Colon cancer Maternal Uncle     Social History:  Social History   Socioeconomic History  . Marital status: Married    Spouse name: Not on file  . Number of  children: Not on file  . Years of education: Not on file  . Highest education level: Not on file  Occupational History  . Occupation: Visual merchandiser: [employer]  Social Needs  . Financial resource strain: Not on file  . Food insecurity:    Worry: Not on file    Inability: Not on file  . Transportation needs:    Medical: Not on file    Non-medical: Not on file  Tobacco Use  . Smoking status: Never Smoker  . Smokeless tobacco: Never Used  Substance and Sexual Activity  . Alcohol use: No    Alcohol/week: 0.0 oz  . Drug use: No  . Sexual activity: Not on file  Lifestyle  . Physical activity:    Days per week: Not on file    Minutes per session: Not on file  . Stress: Not on file  Relationships  . Social  connections:    Talks on phone: Not on file    Gets together: Not on file    Attends religious service: Not on file    Active member of club or organization: Not on file    Attends meetings of clubs or organizations: Not on file    Relationship status: Not on file  Other Topics Concern  . Not on file  Social History Narrative  . Not on file    Allergies:  Allergies  Allergen Reactions  . Skelaxin [Metaxalone] Hives and Rash    Metabolic Disorder Labs: No results found for: HGBA1C, MPG No results found for: PROLACTIN Lab Results  Component Value Date   CHOL 215 (H) 03/11/2014   TRIG 288 (H) 03/11/2014   HDL 33 (L) 03/11/2014   CHOLHDL 6.5 03/11/2014   VLDL 58 (H) 03/11/2014   LDLCALC 124 (H) 03/11/2014   No results found for: TSH  Therapeutic Level Labs: No results found for: LITHIUM No results found for: VALPROATE No components found for:  CBMZ  Current Medications: Current Outpatient Medications  Medication Sig Dispense Refill  . ALPRAZolam (XANAX) 1 MG tablet Take 1 tablet (1 mg total) by mouth 4 (four) times daily. 120 tablet 2  . amLODipine (NORVASC) 5 MG tablet Take 1 tablet (5 mg total) by mouth daily. 90 tablet 3  . buPROPion (WELLBUTRIN XL) 300 MG 24 hr tablet Take 1 tablet (300 mg total) by mouth every morning. 60 tablet 2  . calcium citrate-vitamin D 500-400 MG-UNIT chewable tablet Chew 1 tablet by mouth 3 (three) times daily.    . furosemide (LASIX) 40 MG tablet Take 40 mg by mouth as needed.    Marland Kitchen levothyroxine (SYNTHROID, LEVOTHROID) 200 MCG tablet Take 200 mcg by mouth daily before breakfast.    . losartan (COZAAR) 50 MG tablet Take 100 mg by mouth daily.     . meclizine (ANTIVERT) 25 MG tablet Take 1 tablet (25 mg total) by mouth every 8 (eight) hours as needed for dizziness. 90 tablet 0  . Multiple Vitamin (MULTIVITAMIN) tablet Take 1 tablet by mouth 3 (three) times daily.     . naltrexone (DEPADE) 50 MG tablet     . nystatin (MYCOSTATIN/NYSTOP)  powder as needed.    Marland Kitchen omeprazole (PRILOSEC) 20 MG capsule Take 20 mg by mouth daily.    . prazosin (MINIPRESS) 5 MG capsule Take 1 capsule (5 mg total) by mouth at bedtime. 90 capsule 2  . temazepam (RESTORIL) 30 MG capsule Take 1 capsule (30 mg total) by mouth at bedtime as  needed for sleep. 30 capsule 0  . traZODone (DESYREL) 100 MG tablet Take 1 tablet (100 mg total) by mouth at bedtime. 30 tablet 2  . vitamin B-12 (CYANOCOBALAMIN) 500 MCG tablet Take 500 mcg by mouth daily.     No current facility-administered medications for this visit.      Musculoskeletal: Strength & Muscle Tone: within normal limits Gait & Station: normal Patient leans: N/A  Psychiatric Specialty Exam: Review of Systems  Psychiatric/Behavioral: Positive for depression.  All other systems reviewed and are negative.   Blood pressure (!) 160/91, pulse 60, resp. rate 16, weight 221 lb 12.8 oz (100.6 kg).Body mass index is 30.93 kg/m.  General Appearance: Casual, Neat and Well Groomed  Eye Contact:  Good  Speech:  Clear and Coherent  Volume:  Decreased  Mood:  Dysphoric  Affect:  Constricted  Thought Process:  Goal Directed  Orientation:  Full (Time, Place, and Person)  Thought Content: Rumination   Suicidal Thoughts:  No  Homicidal Thoughts:  No  Memory:  Immediate;   Good Recent;   Good Remote;   Good  Judgement:  Fair  Insight:  Fair  Psychomotor Activity:  Decreased  Concentration:  Concentration: Fair and Attention Span: Fair  Recall:  Good  Fund of Knowledge: Good  Language: Good  Akathisia:  No  Handed:  Right  AIMS (if indicated): not done  Assets:  Communication Skills Desire for Improvement Resilience Social Support Talents/Skills  ADL's:  Intact  Cognition: WNL  Sleep:  Fair   Screenings:   Assessment and Plan: This patient is a 57 year old female who is dealing with depression anxiety and now significant grief regarding her husband's death.  I have urged her to get back into  therapy.  She is not sleeping all that well trazodone because of bad thoughts so we will start Restoril 30 mill grams at bedtime again along with the prazosin 5 mg at bedtime to prevent nightmares.  She will continue Wellbutrin XL 300 mg daily for depression and Xanax 1 mg 4 times daily for anxiety.  She will return to see me in 2 months   [name], MD 10/16/2017, 9:43 AM

## 2017-10-30 DIAGNOSIS — N189 Chronic kidney disease, unspecified: Secondary | ICD-10-CM | POA: Diagnosis not present

## 2017-10-30 DIAGNOSIS — N184 Chronic kidney disease, stage 4 (severe): Secondary | ICD-10-CM | POA: Diagnosis not present

## 2017-10-30 DIAGNOSIS — R946 Abnormal results of thyroid function studies: Secondary | ICD-10-CM | POA: Diagnosis not present

## 2017-10-31 ENCOUNTER — Ambulatory Visit (INDEPENDENT_AMBULATORY_CARE_PROVIDER_SITE_OTHER): Payer: Medicare PPO | Admitting: Psychiatry

## 2017-10-31 DIAGNOSIS — F411 Generalized anxiety disorder: Secondary | ICD-10-CM

## 2017-10-31 DIAGNOSIS — F322 Major depressive disorder, single episode, severe without psychotic features: Secondary | ICD-10-CM | POA: Diagnosis not present

## 2017-10-31 NOTE — Progress Notes (Signed)
   THERAPIST PROGRESS NOTE  Session Time:   Thursday 10/31/2017 3:02 PM - 3:58 PM    Participation Level: Active  Behavioral Response: Casual/Alert/lessdepressed/anxious  Type of Therapy: Individual Therapy  Treatment Goals addressed:Begin a healthy grieving process around the loss of husband      Interventions: CBT and Supportive  Summary: April Ward is a 57 y.o. female who presents with a history of recurrent symptoms of depression and anxiety. She states she initially became depressed in May of 2014 when she and her sisters began having issues regarding expenses for the family home. 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 Patient reports lifelong perfectionistic tendencies and says anxiety has worsened stating that she tends to think too much. Patient's symptoms included depressed mood, anxiety, racing thoughts, ruminating thoughts, sleep difficulty (2-3 hours of sleep per night) excessive worrying, crying spells, loss of interest in activities, and loss of appetite.   Patient last was seen in April 2019. She reports missing appointments due to depressed mood, fatigue, and loss of motivation. She reports experiencing  increased feelings of sadness along with tearfulness. She reports decreased involvement in activities l and no longer attending gym or going to church. She declines dinner invitations to restaurants she and husband used to go to and does not answer the door when people come to visit. She also reports loss of appetite and poor concentration. She reports difficulty staying asleep and states getting 3-4 hours of sleep per night. She reports still missing husband and constantly thinking about him. She reports worries of her dog being dead if the dog doesn't come to the door  as soon as she gets home. She also reports having thoughts of her kidney functioning declining and needing dialysis although there has been no change in her kidney functioning.  She reports she has had thoughts of death but no plan or intent to harm self. Increased symptoms of depression appear to have been triggered by the deaths of three family members including a cousin who died shortly after he had gone to dialysis and developed a blood clot. This triggered grief loss issues regarding her husband as well as patient's fears about her own health.   Suicidal/Homicidal: No  Therapist Response:  Discussed no show policy, discussed importance of treatment compliance and obtained commitment to attend treatment, did behavioral analysis of missing appointments to assist patient identify strategies  to use to avoid missing appointments, reviewed symptoms, facilitated expression of thoughts and feelings, validated feelings, administered Hogan Grief Reaction Checklist, assigned patient to implement strategies discussed in session to avoid missing appointments    Plan: Return again in 2 weeks.   Diagnosis:Axis I: MDD, GAD  Axis II: No diagnosis

## 2017-11-18 ENCOUNTER — Ambulatory Visit (HOSPITAL_COMMUNITY): Payer: Medicare PPO | Admitting: Psychiatry

## 2017-11-25 DIAGNOSIS — N184 Chronic kidney disease, stage 4 (severe): Secondary | ICD-10-CM | POA: Diagnosis not present

## 2017-11-25 DIAGNOSIS — E46 Unspecified protein-calorie malnutrition: Secondary | ICD-10-CM | POA: Diagnosis not present

## 2017-11-25 DIAGNOSIS — Z9884 Bariatric surgery status: Secondary | ICD-10-CM | POA: Diagnosis not present

## 2017-11-25 DIAGNOSIS — Z6830 Body mass index (BMI) 30.0-30.9, adult: Secondary | ICD-10-CM | POA: Diagnosis not present

## 2017-11-25 DIAGNOSIS — E669 Obesity, unspecified: Secondary | ICD-10-CM | POA: Diagnosis not present

## 2017-12-17 ENCOUNTER — Ambulatory Visit (INDEPENDENT_AMBULATORY_CARE_PROVIDER_SITE_OTHER): Payer: Medicare PPO | Admitting: Psychiatry

## 2017-12-17 ENCOUNTER — Encounter (HOSPITAL_COMMUNITY): Payer: Self-pay | Admitting: Psychiatry

## 2017-12-17 VITALS — BP 152/84 | HR 116 | Ht 71.0 in | Wt 218.0 lb

## 2017-12-17 DIAGNOSIS — F322 Major depressive disorder, single episode, severe without psychotic features: Secondary | ICD-10-CM

## 2017-12-17 DIAGNOSIS — F411 Generalized anxiety disorder: Secondary | ICD-10-CM | POA: Diagnosis not present

## 2017-12-17 MED ORDER — ALPRAZOLAM 1 MG PO TABS: 1.0000 mg | ORAL_TABLET | Freq: Four times a day (QID) | ORAL | 2 refills | Status: DC

## 2017-12-17 MED ORDER — TEMAZEPAM 30 MG PO CAPS: 30.0000 mg | ORAL_CAPSULE | Freq: Every evening | ORAL | 0 refills | Status: DC | PRN

## 2017-12-17 MED ORDER — PRAZOSIN HCL 5 MG PO CAPS: 5.0000 mg | ORAL_CAPSULE | Freq: Every day | ORAL | 2 refills | Status: DC

## 2017-12-17 MED ORDER — BUPROPION HCL ER (XL) 300 MG PO TB24: 300.0000 mg | ORAL_TABLET | ORAL | 2 refills | Status: DC

## 2017-12-17 NOTE — Progress Notes (Signed)
[facility] MD/PA/NP OP Progress Note  12/17/2017 8:47 AM April Ward  MRN:  [id number]  Chief Complaint:  Chief Complaint    Depression; Anxiety; Follow-up     HPI: This patient is a 57 year old widowed black female who lives with her son in [place].  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  The patient returns after 2 months for follow-up of depression and anxiety.  She lost her husband in March 2018 and this is been difficult for her.  She also has chronic renal failure and recently her numbers have gotten worse.  Her GFR is down to 8.  Her nephrologist is talking to her now about going on dialysis.  She is upset about this and is even thought about refusing dialysis but she knows this would be a death sentence.  She does not overtly want to harm herself at the ideal of dialysis is upsetting.  She is willing to think about peritoneal dialysis but she does not want to do hemodialysis.  She is also trying to get on a [facility] and [facility].  For the most part her mood is been good and she has been walking 3 to 4 miles a day.  Her weight continues to come down.  Her energy is fairly good she has very good support from her nephrology team.  She is sleeping well and states that the Xanax is really helping with her anxiety. Visit Diagnosis:    ICD-10-CM   1. Major depressive disorder, single episode, severe without psychotic features ([place]) F32.2   2. GAD (generalized anxiety disorder) F41.1     Past Psychiatric History: none  Past Medical History:  Past Medical History:  Diagnosis Date  . Anxiety   . Arthritis   . Depression   . Diabetes mellitus without complication ([place])   . GERD (gastroesophageal reflux disease)   . Gout   . Heart murmur   . Hypertension   . Renal cancer ([place])   . Renal disease 10/2012  . Renal insufficiency     Past Surgical History:  Procedure Laterality Date  . CESAREAN SECTION  [id number]  . CHONDROPLASTY Right 08/22/2012   Procedure: CHONDROPLASTY;  Surgeon: Carole Civil, MD;  Location: [facility];  Service: Orthopedics;  Laterality: Right;  . COLONOSCOPY WITH PROPOFOL N/A 03/16/2014   Procedure: COLONOSCOPY WITH PROPOFOL (at cecum 1023, total withdrawal time=9 minutes);  Surgeon: Danie Binder, MD;  Location: [facility];  Service: Endoscopy;  Laterality: N/A;  . GASTRIC BYPASS    . KNEE ARTHROSCOPY WITH MEDIAL MENISECTOMY Right 08/22/2012   Procedure: KNEE ARTHROSCOPY WITH PARTIAL MEDIAL MENISECTOMY;  Surgeon: Carole Civil, MD;  Location: [facility];  Service: Orthopedics;  Laterality: Right;  . PARTIAL NEPHRECTOMY  Dec 2014   left  . TENDON REPAIR      Family Psychiatric History: See below  Family History:  Family History  Problem Relation Age of Onset  . Heart attack Mother   . Heart attack Father   . Depression Paternal Aunt   . Alcohol abuse Maternal Uncle   . Colon cancer Maternal Aunt   . Colon cancer Maternal Uncle     Social History:  Social History   Socioeconomic History  . Marital status: Married    Spouse name: Not on file  . Number of children: Not on file  . Years of education: Not on file  . Highest education level: Not on file  Occupational History  .  Occupation: Visual merchandiser: [employer]  Social Needs  . Financial resource strain: Not on file  . Food insecurity:    Worry: Not on file    Inability: Not on file  . Transportation needs:    Medical: Not on file    Non-medical: Not on file  Tobacco Use  . Smoking status: Never Smoker  . Smokeless tobacco: Never Used  Substance and Sexual Activity  . Alcohol use: No    Alcohol/week: 0.0 standard drinks  . Drug use: No  . Sexual activity: Not on file  Lifestyle  . Physical activity:    Days per week: Not on file    Minutes per session: Not on file  . Stress: Not on file  Relationships  . Social connections:    Talks on phone: Not on file    Gets together: Not on file    Attends religious  service: Not on file    Active member of club or organization: Not on file    Attends meetings of clubs or organizations: Not on file    Relationship status: Not on file  Other Topics Concern  . Not on file  Social History Narrative  . Not on file    Allergies:  Allergies  Allergen Reactions  . Skelaxin [Metaxalone] Hives and Rash    Metabolic Disorder Labs: No results found for: HGBA1C, MPG No results found for: PROLACTIN Lab Results  Component Value Date   CHOL 215 (H) 03/11/2014   TRIG 288 (H) 03/11/2014   HDL 33 (L) 03/11/2014   CHOLHDL 6.5 03/11/2014   VLDL 58 (H) 03/11/2014   LDLCALC 124 (H) 03/11/2014   No results found for: TSH  Therapeutic Level Labs: No results found for: LITHIUM No results found for: VALPROATE No components found for:  CBMZ  Current Medications: Current Outpatient Medications  Medication Sig Dispense Refill  . ALPRAZolam (XANAX) 1 MG tablet Take 1 tablet (1 mg total) by mouth 4 (four) times daily. 120 tablet 2  . amLODipine (NORVASC) 5 MG tablet Take 1 tablet (5 mg total) by mouth daily. 90 tablet 3  . buPROPion (WELLBUTRIN XL) 300 MG 24 hr tablet Take 1 tablet (300 mg total) by mouth every morning. 60 tablet 2  . calcium citrate-vitamin D 500-400 MG-UNIT chewable tablet Chew 1 tablet by mouth 3 (three) times daily.    . furosemide (LASIX) 40 MG tablet Take 40 mg by mouth as needed.    Marland Kitchen levothyroxine (SYNTHROID, LEVOTHROID) 200 MCG tablet Take 200 mcg by mouth daily before breakfast.    . losartan (COZAAR) 50 MG tablet Take 100 mg by mouth daily.     . meclizine (ANTIVERT) 25 MG tablet Take 1 tablet (25 mg total) by mouth every 8 (eight) hours as needed for dizziness. 90 tablet 0  . Multiple Vitamin (MULTIVITAMIN) tablet Take 1 tablet by mouth 3 (three) times daily.     . naltrexone (DEPADE) 50 MG tablet     . nystatin (MYCOSTATIN/NYSTOP) powder as needed.    Marland Kitchen omeprazole (PRILOSEC) 20 MG capsule Take 20 mg by mouth daily.    . prazosin  (MINIPRESS) 5 MG capsule Take 1 capsule (5 mg total) by mouth at bedtime. 90 capsule 2  . sevelamer carbonate (RENVELA) 800 MG tablet Take by mouth.    . temazepam (RESTORIL) 30 MG capsule Take 1 capsule (30 mg total) by mouth at bedtime as needed for sleep. 30 capsule 0  . vitamin B-12 (CYANOCOBALAMIN)  500 MCG tablet Take 500 mcg by mouth daily.     No current facility-administered medications for this visit.      Musculoskeletal: Strength & Muscle Tone: within normal limits Gait & Station: normal Patient leans: N/A  Psychiatric Specialty Exam: Review of Systems  Psychiatric/Behavioral: The patient is nervous/anxious.   All other systems reviewed and are negative.   Blood pressure (!) 152/84, pulse (!) 116, height 5\' 11"  (1.803 m), weight 218 lb (98.9 kg), SpO2 99 %.Body mass index is 30.4 kg/m.  General Appearance: Casual, Neat and Well Groomed  Eye Contact:  Good  Speech:  Clear and Coherent  Volume:  Normal  Mood:  Anxious and Dysphoric  Affect:  Constricted  Thought Process:  Goal Directed  Orientation:  Full (Time, Place, and Person)  Thought Content: Rumination   Suicidal Thoughts:  No  Homicidal Thoughts:  No  Memory:  Immediate;   Good Recent;   Good Remote;   Good  Judgement:  Good  Insight:  Good  Psychomotor Activity:  Decreased  Concentration:  Concentration: Good and Attention Span: Good  Recall:  Good  Fund of Knowledge: Good  Language: Good  Akathisia:  No  Handed:  Right  AIMS (if indicated): not done  Assets:  Communication Skills Desire for Improvement Resilience Social Support Talents/Skills  ADL's:  Intact  Cognition: WNL  Sleep:  Good   Screenings:   Assessment and Plan: This patient is a 57 year old female with chronic renal failure, end-stage renal disease depression and anxiety as well as nightmares.  She has some very big decisions to make right now.  She is trying to get information and wants to talk to other people been through  dialysis to see what it is like and I think this is a reasonable idea.  She promises that she is not suicidal or overtly trying to harm herself in any way.  She will continue Restoril 30 mg at bedtime for sleep, prazosin 5 mg at bedtime for nightmares, Wellbutrin XL 300 mg every morning for depression and Xanax 1 mg 4 times daily for anxiety.  She will return to see me in 2 months   [name], MD 12/17/2017, 8:47 AM

## 2017-12-18 ENCOUNTER — Ambulatory Visit (HOSPITAL_COMMUNITY): Payer: Self-pay | Admitting: Psychiatry

## 2017-12-23 DIAGNOSIS — Z23 Encounter for immunization: Secondary | ICD-10-CM | POA: Diagnosis not present

## 2017-12-23 DIAGNOSIS — C649 Malignant neoplasm of unspecified kidney, except renal pelvis: Secondary | ICD-10-CM | POA: Diagnosis not present

## 2017-12-23 DIAGNOSIS — N184 Chronic kidney disease, stage 4 (severe): Secondary | ICD-10-CM | POA: Diagnosis not present

## 2017-12-23 DIAGNOSIS — N2581 Secondary hyperparathyroidism of renal origin: Secondary | ICD-10-CM | POA: Diagnosis not present

## 2017-12-23 DIAGNOSIS — Z6830 Body mass index (BMI) 30.0-30.9, adult: Secondary | ICD-10-CM | POA: Diagnosis not present

## 2017-12-23 DIAGNOSIS — I129 Hypertensive chronic kidney disease with stage 1 through stage 4 chronic kidney disease, or unspecified chronic kidney disease: Secondary | ICD-10-CM | POA: Diagnosis not present

## 2017-12-23 DIAGNOSIS — N183 Chronic kidney disease, stage 3 (moderate): Secondary | ICD-10-CM | POA: Diagnosis not present

## 2017-12-23 DIAGNOSIS — D631 Anemia in chronic kidney disease: Secondary | ICD-10-CM | POA: Diagnosis not present

## 2018-01-01 ENCOUNTER — Encounter (HOSPITAL_COMMUNITY): Payer: Self-pay | Admitting: Psychiatry

## 2018-01-01 ENCOUNTER — Ambulatory Visit (INDEPENDENT_AMBULATORY_CARE_PROVIDER_SITE_OTHER): Payer: Medicare PPO | Admitting: Psychiatry

## 2018-01-01 DIAGNOSIS — F411 Generalized anxiety disorder: Secondary | ICD-10-CM

## 2018-01-01 DIAGNOSIS — F322 Major depressive disorder, single episode, severe without psychotic features: Secondary | ICD-10-CM | POA: Diagnosis not present

## 2018-01-01 NOTE — Progress Notes (Signed)
   THERAPIST PROGRESS NOTE  Session Time:   Wednesday 01/01/2018 8:15 AM - 9:00 AM   Participation Level: Active  Behavioral Response: Casual/Alert/lessdepressed/anxious  Type of Therapy: Individual Therapy  Treatment Goals addressed:Begin a healthy grieving process around the loss of husband            Interventions: CBT and Supportive  Summary: April Ward is a 57 y.o. female who presents with a history of recurrent symptoms of depression and anxiety. She states she initially became depressed in May of 2014 when she and her sisters began having issues regarding expenses for the family home. 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 Patient reports lifelong perfectionistic tendencies and says anxiety has worsened stating that she tends to think too much. Patient's symptoms included depressed mood, anxiety, racing thoughts, ruminating thoughts, sleep difficulty (2-3 hours of sleep per night) excessive worrying, crying spells, loss of interest in activities, and loss of appetite.   Patient last was seen in August 2019. She reports increased stress as her kidney functioning is continuing to decline. Per patient's report, she saw her nephrologist in September who advised patient to go on dialysis, however patient is against this but is willing to consider as a last resort. She is waiting for paperwork to complete to be considered for a kidney transplant doing [facility]. Patient reports using her spirituality and researching ways to change her diet to try to cope. She is trying to maintain involvement in activity such as working in her 4 hours and walking. However, her activity level fluctuates due to fatigue. Patient reports sometimes having difficulty sleeping at night due to severe  itching as a result of kidneys not filtering phosphorus in her body. She reports going to medical appointments by herself as she does not want to worry her children. She reports not really talking to anyone about her feelings as she does not want people to worry. She expresses sadness and fear. She continues to miss her husband and expresses sadness he is not here to support her through this.  Suicidal/Homicidal: No  Therapist Response:  Discussed stressors, facilitated expression of thoughts and assisted patient identify and verbalize feelings, validated feelings, assisted patient identify ways to maintain involvement in activities consistent with her values    Plan: Return again in 2 weeks.   Diagnosis:Axis I: MDD, GAD  Axis II: No diagnosis

## 2018-01-06 DIAGNOSIS — I129 Hypertensive chronic kidney disease with stage 1 through stage 4 chronic kidney disease, or unspecified chronic kidney disease: Secondary | ICD-10-CM | POA: Diagnosis not present

## 2018-01-06 DIAGNOSIS — N184 Chronic kidney disease, stage 4 (severe): Secondary | ICD-10-CM | POA: Diagnosis not present

## 2018-01-06 DIAGNOSIS — E1122 Type 2 diabetes mellitus with diabetic chronic kidney disease: Secondary | ICD-10-CM | POA: Diagnosis not present

## 2018-01-06 DIAGNOSIS — Z9884 Bariatric surgery status: Secondary | ICD-10-CM | POA: Diagnosis not present

## 2018-01-10 ENCOUNTER — Ambulatory Visit (HOSPITAL_COMMUNITY): Payer: Self-pay | Admitting: Psychiatry

## 2018-01-17 ENCOUNTER — Ambulatory Visit (HOSPITAL_COMMUNITY): Payer: Self-pay | Admitting: Psychiatry

## 2018-01-22 DIAGNOSIS — I10 Essential (primary) hypertension: Secondary | ICD-10-CM | POA: Diagnosis not present

## 2018-01-22 DIAGNOSIS — N184 Chronic kidney disease, stage 4 (severe): Secondary | ICD-10-CM | POA: Diagnosis not present

## 2018-01-22 DIAGNOSIS — L905 Scar conditions and fibrosis of skin: Secondary | ICD-10-CM | POA: Diagnosis not present

## 2018-01-22 DIAGNOSIS — K660 Peritoneal adhesions (postprocedural) (postinfection): Secondary | ICD-10-CM | POA: Diagnosis not present

## 2018-01-22 DIAGNOSIS — R110 Nausea: Secondary | ICD-10-CM | POA: Diagnosis not present

## 2018-01-22 DIAGNOSIS — R112 Nausea with vomiting, unspecified: Secondary | ICD-10-CM | POA: Diagnosis not present

## 2018-01-22 DIAGNOSIS — I129 Hypertensive chronic kidney disease with stage 1 through stage 4 chronic kidney disease, or unspecified chronic kidney disease: Secondary | ICD-10-CM | POA: Diagnosis not present

## 2018-01-22 DIAGNOSIS — R42 Dizziness and giddiness: Secondary | ICD-10-CM | POA: Diagnosis not present

## 2018-01-22 DIAGNOSIS — N186 End stage renal disease: Secondary | ICD-10-CM | POA: Diagnosis not present

## 2018-01-22 DIAGNOSIS — Z992 Dependence on renal dialysis: Secondary | ICD-10-CM | POA: Diagnosis not present

## 2018-01-22 DIAGNOSIS — Z905 Acquired absence of kidney: Secondary | ICD-10-CM | POA: Diagnosis not present

## 2018-01-22 DIAGNOSIS — I120 Hypertensive chronic kidney disease with stage 5 chronic kidney disease or end stage renal disease: Secondary | ICD-10-CM | POA: Diagnosis not present

## 2018-01-22 DIAGNOSIS — E1122 Type 2 diabetes mellitus with diabetic chronic kidney disease: Secondary | ICD-10-CM | POA: Diagnosis not present

## 2018-01-22 DIAGNOSIS — R0902 Hypoxemia: Secondary | ICD-10-CM | POA: Diagnosis not present

## 2018-01-22 DIAGNOSIS — I160 Hypertensive urgency: Secondary | ICD-10-CM | POA: Diagnosis not present

## 2018-01-23 DIAGNOSIS — E1122 Type 2 diabetes mellitus with diabetic chronic kidney disease: Secondary | ICD-10-CM | POA: Diagnosis not present

## 2018-01-23 DIAGNOSIS — I10 Essential (primary) hypertension: Secondary | ICD-10-CM | POA: Diagnosis not present

## 2018-01-23 DIAGNOSIS — I120 Hypertensive chronic kidney disease with stage 5 chronic kidney disease or end stage renal disease: Secondary | ICD-10-CM | POA: Diagnosis not present

## 2018-01-23 DIAGNOSIS — R42 Dizziness and giddiness: Secondary | ICD-10-CM | POA: Diagnosis not present

## 2018-01-23 DIAGNOSIS — I440 Atrioventricular block, first degree: Secondary | ICD-10-CM | POA: Diagnosis not present

## 2018-01-23 DIAGNOSIS — I160 Hypertensive urgency: Secondary | ICD-10-CM | POA: Diagnosis not present

## 2018-01-23 DIAGNOSIS — R9431 Abnormal electrocardiogram [ECG] [EKG]: Secondary | ICD-10-CM | POA: Diagnosis not present

## 2018-01-23 DIAGNOSIS — N185 Chronic kidney disease, stage 5: Secondary | ICD-10-CM | POA: Diagnosis not present

## 2018-01-24 DIAGNOSIS — Z4902 Encounter for fitting and adjustment of peritoneal dialysis catheter: Secondary | ICD-10-CM | POA: Diagnosis not present

## 2018-01-24 DIAGNOSIS — Z992 Dependence on renal dialysis: Secondary | ICD-10-CM | POA: Diagnosis not present

## 2018-01-24 DIAGNOSIS — Z905 Acquired absence of kidney: Secondary | ICD-10-CM | POA: Diagnosis not present

## 2018-01-24 DIAGNOSIS — N051 Unspecified nephritic syndrome with focal and segmental glomerular lesions: Secondary | ICD-10-CM | POA: Diagnosis not present

## 2018-01-24 DIAGNOSIS — N186 End stage renal disease: Secondary | ICD-10-CM | POA: Diagnosis not present

## 2018-01-24 DIAGNOSIS — E1122 Type 2 diabetes mellitus with diabetic chronic kidney disease: Secondary | ICD-10-CM | POA: Diagnosis not present

## 2018-01-24 DIAGNOSIS — F329 Major depressive disorder, single episode, unspecified: Secondary | ICD-10-CM | POA: Diagnosis not present

## 2018-01-24 DIAGNOSIS — I120 Hypertensive chronic kidney disease with stage 5 chronic kidney disease or end stage renal disease: Secondary | ICD-10-CM | POA: Diagnosis not present

## 2018-01-25 DIAGNOSIS — I120 Hypertensive chronic kidney disease with stage 5 chronic kidney disease or end stage renal disease: Secondary | ICD-10-CM | POA: Diagnosis not present

## 2018-01-25 DIAGNOSIS — Z905 Acquired absence of kidney: Secondary | ICD-10-CM | POA: Diagnosis not present

## 2018-01-25 DIAGNOSIS — Z992 Dependence on renal dialysis: Secondary | ICD-10-CM | POA: Diagnosis not present

## 2018-01-25 DIAGNOSIS — I160 Hypertensive urgency: Secondary | ICD-10-CM | POA: Diagnosis not present

## 2018-01-25 DIAGNOSIS — R42 Dizziness and giddiness: Secondary | ICD-10-CM | POA: Diagnosis not present

## 2018-01-25 DIAGNOSIS — N185 Chronic kidney disease, stage 5: Secondary | ICD-10-CM | POA: Diagnosis not present

## 2018-01-25 DIAGNOSIS — E1122 Type 2 diabetes mellitus with diabetic chronic kidney disease: Secondary | ICD-10-CM | POA: Diagnosis not present

## 2018-01-25 DIAGNOSIS — Z95828 Presence of other vascular implants and grafts: Secondary | ICD-10-CM | POA: Diagnosis not present

## 2018-01-30 ENCOUNTER — Other Ambulatory Visit (HOSPITAL_COMMUNITY): Payer: Self-pay | Admitting: Psychiatry

## 2018-01-30 DIAGNOSIS — N186 End stage renal disease: Secondary | ICD-10-CM | POA: Diagnosis not present

## 2018-01-30 MED ORDER — ALPRAZOLAM 1 MG PO TABS: 1.0000 mg | ORAL_TABLET | Freq: Four times a day (QID) | ORAL | 2 refills | Status: DC

## 2018-01-31 DIAGNOSIS — R591 Generalized enlarged lymph nodes: Secondary | ICD-10-CM | POA: Diagnosis not present

## 2018-02-03 ENCOUNTER — Other Ambulatory Visit (HOSPITAL_COMMUNITY): Payer: Self-pay | Admitting: Psychiatry

## 2018-02-03 MED ORDER — ALPRAZOLAM 1 MG PO TABS: 1.0000 mg | ORAL_TABLET | Freq: Four times a day (QID) | ORAL | 2 refills | Status: DC

## 2018-02-06 DIAGNOSIS — Z4932 Encounter for adequacy testing for peritoneal dialysis: Secondary | ICD-10-CM | POA: Diagnosis not present

## 2018-02-06 DIAGNOSIS — N186 End stage renal disease: Secondary | ICD-10-CM | POA: Diagnosis not present

## 2018-02-06 DIAGNOSIS — R591 Generalized enlarged lymph nodes: Secondary | ICD-10-CM | POA: Diagnosis not present

## 2018-02-10 DIAGNOSIS — D631 Anemia in chronic kidney disease: Secondary | ICD-10-CM | POA: Diagnosis not present

## 2018-02-10 DIAGNOSIS — N2581 Secondary hyperparathyroidism of renal origin: Secondary | ICD-10-CM | POA: Diagnosis not present

## 2018-02-10 DIAGNOSIS — N186 End stage renal disease: Secondary | ICD-10-CM | POA: Diagnosis not present

## 2018-02-11 DIAGNOSIS — N2581 Secondary hyperparathyroidism of renal origin: Secondary | ICD-10-CM | POA: Diagnosis not present

## 2018-02-11 DIAGNOSIS — D631 Anemia in chronic kidney disease: Secondary | ICD-10-CM | POA: Diagnosis not present

## 2018-02-11 DIAGNOSIS — N186 End stage renal disease: Secondary | ICD-10-CM | POA: Diagnosis not present

## 2018-02-12 ENCOUNTER — Other Ambulatory Visit (HOSPITAL_COMMUNITY): Payer: Self-pay | Admitting: Psychiatry

## 2018-02-12 DIAGNOSIS — N186 End stage renal disease: Secondary | ICD-10-CM | POA: Diagnosis not present

## 2018-02-12 DIAGNOSIS — E119 Type 2 diabetes mellitus without complications: Secondary | ICD-10-CM | POA: Diagnosis not present

## 2018-02-12 DIAGNOSIS — D631 Anemia in chronic kidney disease: Secondary | ICD-10-CM | POA: Diagnosis not present

## 2018-02-12 DIAGNOSIS — Z1159 Encounter for screening for other viral diseases: Secondary | ICD-10-CM | POA: Diagnosis not present

## 2018-02-12 DIAGNOSIS — N2581 Secondary hyperparathyroidism of renal origin: Secondary | ICD-10-CM | POA: Diagnosis not present

## 2018-02-13 DIAGNOSIS — N186 End stage renal disease: Secondary | ICD-10-CM | POA: Diagnosis not present

## 2018-02-13 DIAGNOSIS — D631 Anemia in chronic kidney disease: Secondary | ICD-10-CM | POA: Diagnosis not present

## 2018-02-13 DIAGNOSIS — N2581 Secondary hyperparathyroidism of renal origin: Secondary | ICD-10-CM | POA: Diagnosis not present

## 2018-02-14 DIAGNOSIS — E890 Postprocedural hypothyroidism: Secondary | ICD-10-CM | POA: Diagnosis not present

## 2018-02-14 DIAGNOSIS — N186 End stage renal disease: Secondary | ICD-10-CM | POA: Diagnosis not present

## 2018-02-14 DIAGNOSIS — D509 Iron deficiency anemia, unspecified: Secondary | ICD-10-CM | POA: Diagnosis not present

## 2018-02-14 DIAGNOSIS — C73 Malignant neoplasm of thyroid gland: Secondary | ICD-10-CM | POA: Diagnosis not present

## 2018-02-15 DIAGNOSIS — D509 Iron deficiency anemia, unspecified: Secondary | ICD-10-CM | POA: Diagnosis not present

## 2018-02-15 DIAGNOSIS — N186 End stage renal disease: Secondary | ICD-10-CM | POA: Diagnosis not present

## 2018-02-16 DIAGNOSIS — N186 End stage renal disease: Secondary | ICD-10-CM | POA: Diagnosis not present

## 2018-02-16 DIAGNOSIS — D509 Iron deficiency anemia, unspecified: Secondary | ICD-10-CM | POA: Diagnosis not present

## 2018-02-17 ENCOUNTER — Encounter (HOSPITAL_COMMUNITY): Payer: Self-pay | Admitting: Psychiatry

## 2018-02-17 ENCOUNTER — Ambulatory Visit (INDEPENDENT_AMBULATORY_CARE_PROVIDER_SITE_OTHER): Payer: Medicare PPO | Admitting: Psychiatry

## 2018-02-17 VITALS — BP 160/80 | HR 68 | Ht 71.0 in | Wt 217.0 lb

## 2018-02-17 DIAGNOSIS — F411 Generalized anxiety disorder: Secondary | ICD-10-CM | POA: Diagnosis not present

## 2018-02-17 DIAGNOSIS — D509 Iron deficiency anemia, unspecified: Secondary | ICD-10-CM | POA: Diagnosis not present

## 2018-02-17 DIAGNOSIS — F322 Major depressive disorder, single episode, severe without psychotic features: Secondary | ICD-10-CM | POA: Diagnosis not present

## 2018-02-17 DIAGNOSIS — N186 End stage renal disease: Secondary | ICD-10-CM | POA: Diagnosis not present

## 2018-02-17 MED ORDER — BUPROPION HCL ER (XL) 300 MG PO TB24: 300.0000 mg | ORAL_TABLET | ORAL | 2 refills | Status: DC

## 2018-02-17 MED ORDER — ALPRAZOLAM 1 MG PO TABS: 1.0000 mg | ORAL_TABLET | Freq: Four times a day (QID) | ORAL | 2 refills | Status: DC

## 2018-02-17 MED ORDER — TEMAZEPAM 30 MG PO CAPS: 30.0000 mg | ORAL_CAPSULE | Freq: Every evening | ORAL | 2 refills | Status: DC | PRN

## 2018-02-17 MED ORDER — PRAZOSIN HCL 5 MG PO CAPS: 5.0000 mg | ORAL_CAPSULE | Freq: Every day | ORAL | 2 refills | Status: DC

## 2018-02-17 NOTE — Progress Notes (Signed)
[place] MD/PA/NP OP Progress Note  02/17/2018 8:54 AM April Ward  MRN:  [id number]  Chief Complaint:  Chief Complaint    Depression; Anxiety; Follow-up     HPI: This patient is a 57 year old widowed black female who lives with her son in [place].  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  The patient returns after 2 months for follow-up of depression and anxiety.  She has had more stress recently.  Her youngest sister became very ill mostly because she had not been following her medical regimen for heart disease.  The patient's stayed with her sister in the hospital and the sister ended up dying in October.  This is been difficult particularly on the heels of her husband's death in 2016/07/21.  Following that the patient herself was hospitalized for syncope and was found to have worsening renal failure.  She had a shunt put in for peritoneal dialysis and is now having to do this every night.  For a while she felt like she should die as well but her son stated that she is the only thing he has left.  She has made up her mind to stay alive and try to fight this.  She has an appointment at [facility] for possible renal transplant.  Because of the recent shunt placement she is not allowed to do any exercise.  She states that she is "going stir crazy but in 2 weeks she should be able to go back to the gym.  Her sleep is disrupted by the dialysis at night but the Restoril helps to some degree.  She thinks her medications have been helpful but she is just gone through a lot of stress.  She currently denies suicidal ideation Visit Diagnosis:    ICD-10-CM   1. Major depressive disorder, single episode, severe without psychotic features ([place]) F32.2   2. GAD (generalized anxiety disorder) F41.1     Past Psychiatric History: none  Past Medical History:  Past Medical History:  Diagnosis Date  . Anxiety   . Arthritis   . Depression   . Diabetes mellitus without complication ([place])    . GERD (gastroesophageal reflux disease)   . Gout   . Heart murmur   . Hypertension   . Renal cancer ([place])   . Renal disease 10/2012  . Renal insufficiency     Past Surgical History:  Procedure Laterality Date  . CESAREAN SECTION  [id number]  . CHONDROPLASTY Right 08/22/2012   Procedure: CHONDROPLASTY;  Surgeon: Carole Civil, MD;  Location: [facility];  Service: Orthopedics;  Laterality: Right;  . COLONOSCOPY WITH PROPOFOL N/A 03/16/2014   Procedure: COLONOSCOPY WITH PROPOFOL (at cecum 1023, total withdrawal time=9 minutes);  Surgeon: Danie Binder, MD;  Location: [facility];  Service: Endoscopy;  Laterality: N/A;  . GASTRIC BYPASS    . KNEE ARTHROSCOPY WITH MEDIAL MENISECTOMY Right 08/22/2012   Procedure: KNEE ARTHROSCOPY WITH PARTIAL MEDIAL MENISECTOMY;  Surgeon: Carole Civil, MD;  Location: [facility];  Service: Orthopedics;  Laterality: Right;  . PARTIAL NEPHRECTOMY  Dec 2014   left  . TENDON REPAIR      Family Psychiatric History: See below  Family History:  Family History  Problem Relation Age of Onset  . Heart attack Mother   . Heart attack Father   . Depression Paternal Aunt   . Alcohol abuse Maternal Uncle   . Colon cancer Maternal Aunt   . Colon cancer Maternal Uncle  Social History:  Social History   Socioeconomic History  . Marital status: Married    Spouse name: Not on file  . Number of children: Not on file  . Years of education: Not on file  . Highest education level: Not on file  Occupational History  . Occupation: Visual merchandiser: [employer]  Social Needs  . Financial resource strain: Not on file  . Food insecurity:    Worry: Not on file    Inability: Not on file  . Transportation needs:    Medical: Not on file    Non-medical: Not on file  Tobacco Use  . Smoking status: Never Smoker  . Smokeless tobacco: Never Used  Substance and Sexual Activity  . Alcohol use: No    Alcohol/week: 0.0 standard drinks  . Drug  use: No  . Sexual activity: Not on file  Lifestyle  . Physical activity:    Days per week: Not on file    Minutes per session: Not on file  . Stress: Not on file  Relationships  . Social connections:    Talks on phone: Not on file    Gets together: Not on file    Attends religious service: Not on file    Active member of club or organization: Not on file    Attends meetings of clubs or organizations: Not on file    Relationship status: Not on file  Other Topics Concern  . Not on file  Social History Narrative  . Not on file    Allergies:  Allergies  Allergen Reactions  . Skelaxin [Metaxalone] Hives and Rash    Metabolic Disorder Labs: No results found for: HGBA1C, MPG No results found for: PROLACTIN Lab Results  Component Value Date   CHOL 215 (H) 03/11/2014   TRIG 288 (H) 03/11/2014   HDL 33 (L) 03/11/2014   CHOLHDL 6.5 03/11/2014   VLDL 58 (H) 03/11/2014   LDLCALC 124 (H) 03/11/2014   No results found for: TSH  Therapeutic Level Labs: No results found for: LITHIUM No results found for: VALPROATE No components found for:  CBMZ  Current Medications: Current Outpatient Medications  Medication Sig Dispense Refill  . ALPRAZolam (XANAX) 1 MG tablet Take 1 tablet (1 mg total) by mouth 4 (four) times daily. 120 tablet 2  . amLODipine (NORVASC) 5 MG tablet Take 1 tablet (5 mg total) by mouth daily. 90 tablet 3  . buPROPion (WELLBUTRIN XL) 300 MG 24 hr tablet Take 1 tablet (300 mg total) by mouth every morning. 60 tablet 2  . calcium citrate-vitamin D 500-400 MG-UNIT chewable tablet Chew 1 tablet by mouth 3 (three) times daily.    . furosemide (LASIX) 40 MG tablet Take 40 mg by mouth as needed.    Marland Kitchen levothyroxine (SYNTHROID, LEVOTHROID) 200 MCG tablet Take 200 mcg by mouth daily before breakfast.    . losartan (COZAAR) 50 MG tablet Take 100 mg by mouth daily.     . meclizine (ANTIVERT) 25 MG tablet Take 1 tablet (25 mg total) by mouth every 8 (eight) hours as needed  for dizziness. 90 tablet 0  . Multiple Vitamin (MULTIVITAMIN) tablet Take 1 tablet by mouth 3 (three) times daily.     . naltrexone (DEPADE) 50 MG tablet     . nystatin (MYCOSTATIN/NYSTOP) powder as needed.    Marland Kitchen omeprazole (PRILOSEC) 20 MG capsule Take 20 mg by mouth daily.    . prazosin (MINIPRESS) 5 MG capsule Take 1 capsule (5  mg total) by mouth at bedtime. 90 capsule 2  . sevelamer carbonate (RENVELA) 800 MG tablet Take by mouth.    . temazepam (RESTORIL) 30 MG capsule Take 1 capsule (30 mg total) by mouth at bedtime as needed. for sleep 30 capsule 2  . vitamin B-12 (CYANOCOBALAMIN) 500 MCG tablet Take 500 mcg by mouth daily.     No current facility-administered medications for this visit.      Musculoskeletal: Strength & Muscle Tone: within normal limits Gait & Station: normal Patient leans: N/A  Psychiatric Specialty Exam: Review of Systems  Constitutional: Positive for malaise/fatigue.  Psychiatric/Behavioral: The patient has insomnia.     Blood pressure (!) 160/80, pulse 68, height 5\' 11"  (1.803 m), weight 217 lb (98.4 kg), SpO2 100 %.Body mass index is 30.27 kg/m.  General Appearance: Casual, Neat and Well Groomed  Eye Contact:  Good  Speech:  Clear and Coherent  Volume:  Normal  Mood:  Anxious  Affect:  Appropriate and Congruent  Thought Process:  Goal Directed  Orientation:  Full (Time, Place, and Person)  Thought Content: Rumination   Suicidal Thoughts:  No  Homicidal Thoughts:  No  Memory:  Immediate;   Good Recent;   Good Remote;   Good  Judgement:  Good  Insight:  Good  Psychomotor Activity:  Decreased  Concentration:  Concentration: Good and Attention Span: Good  Recall:  Good  Fund of Knowledge: Good  Language: Good  Akathisia:  No  Handed:  Right  AIMS (if indicated): not done  Assets:  Communication Skills Desire for Improvement Resilience Social Support Talents/Skills  ADL's:  Intact  Cognition: WNL  Sleep:  Fair    Screenings:   Assessment and Plan: This patient is a 57 year old female with a history of depression and anxiety.  She has undergone more stress that she has started peritoneal dialysis and recently lost her sister.  So far she seems to be holding up pretty well.  She will continue Restoril 30 mg at bedtime for sleep as well as prazosin 5 mg at bedtime for nightmares.  She will also continue Wellbutrin XL 300 mg daily for depression and Xanax 1 mg 4 times daily for anxiety.  She will return to see me in 2 months and will continue her counseling here.   [name], MD 02/17/2018, 8:54 AM

## 2018-02-18 DIAGNOSIS — N186 End stage renal disease: Secondary | ICD-10-CM | POA: Diagnosis not present

## 2018-02-18 DIAGNOSIS — D509 Iron deficiency anemia, unspecified: Secondary | ICD-10-CM | POA: Diagnosis not present

## 2018-02-19 DIAGNOSIS — Z4932 Encounter for adequacy testing for peritoneal dialysis: Secondary | ICD-10-CM | POA: Diagnosis not present

## 2018-02-19 DIAGNOSIS — N186 End stage renal disease: Secondary | ICD-10-CM | POA: Diagnosis not present

## 2018-02-19 DIAGNOSIS — D509 Iron deficiency anemia, unspecified: Secondary | ICD-10-CM | POA: Diagnosis not present

## 2018-02-20 DIAGNOSIS — N186 End stage renal disease: Secondary | ICD-10-CM | POA: Diagnosis not present

## 2018-02-20 DIAGNOSIS — D509 Iron deficiency anemia, unspecified: Secondary | ICD-10-CM | POA: Diagnosis not present

## 2018-02-20 DIAGNOSIS — N2581 Secondary hyperparathyroidism of renal origin: Secondary | ICD-10-CM | POA: Diagnosis not present

## 2018-02-21 DIAGNOSIS — D509 Iron deficiency anemia, unspecified: Secondary | ICD-10-CM | POA: Diagnosis not present

## 2018-02-21 DIAGNOSIS — N2581 Secondary hyperparathyroidism of renal origin: Secondary | ICD-10-CM | POA: Diagnosis not present

## 2018-02-21 DIAGNOSIS — N186 End stage renal disease: Secondary | ICD-10-CM | POA: Diagnosis not present

## 2018-02-22 DIAGNOSIS — N2581 Secondary hyperparathyroidism of renal origin: Secondary | ICD-10-CM | POA: Diagnosis not present

## 2018-02-22 DIAGNOSIS — N186 End stage renal disease: Secondary | ICD-10-CM | POA: Diagnosis not present

## 2018-02-22 DIAGNOSIS — D509 Iron deficiency anemia, unspecified: Secondary | ICD-10-CM | POA: Diagnosis not present

## 2018-02-23 DIAGNOSIS — N2581 Secondary hyperparathyroidism of renal origin: Secondary | ICD-10-CM | POA: Diagnosis not present

## 2018-02-23 DIAGNOSIS — N186 End stage renal disease: Secondary | ICD-10-CM | POA: Diagnosis not present

## 2018-02-23 DIAGNOSIS — D509 Iron deficiency anemia, unspecified: Secondary | ICD-10-CM | POA: Diagnosis not present

## 2018-02-24 DIAGNOSIS — N186 End stage renal disease: Secondary | ICD-10-CM | POA: Diagnosis not present

## 2018-02-24 DIAGNOSIS — N2581 Secondary hyperparathyroidism of renal origin: Secondary | ICD-10-CM | POA: Diagnosis not present

## 2018-02-24 DIAGNOSIS — D509 Iron deficiency anemia, unspecified: Secondary | ICD-10-CM | POA: Diagnosis not present

## 2018-02-25 DIAGNOSIS — D509 Iron deficiency anemia, unspecified: Secondary | ICD-10-CM | POA: Diagnosis not present

## 2018-02-25 DIAGNOSIS — N2581 Secondary hyperparathyroidism of renal origin: Secondary | ICD-10-CM | POA: Diagnosis not present

## 2018-02-25 DIAGNOSIS — N186 End stage renal disease: Secondary | ICD-10-CM | POA: Diagnosis not present

## 2018-02-26 DIAGNOSIS — D509 Iron deficiency anemia, unspecified: Secondary | ICD-10-CM | POA: Diagnosis not present

## 2018-02-26 DIAGNOSIS — N2581 Secondary hyperparathyroidism of renal origin: Secondary | ICD-10-CM | POA: Diagnosis not present

## 2018-02-26 DIAGNOSIS — N186 End stage renal disease: Secondary | ICD-10-CM | POA: Diagnosis not present

## 2018-02-27 DIAGNOSIS — N2581 Secondary hyperparathyroidism of renal origin: Secondary | ICD-10-CM | POA: Diagnosis not present

## 2018-02-27 DIAGNOSIS — D509 Iron deficiency anemia, unspecified: Secondary | ICD-10-CM | POA: Diagnosis not present

## 2018-02-27 DIAGNOSIS — N186 End stage renal disease: Secondary | ICD-10-CM | POA: Diagnosis not present

## 2018-02-28 DIAGNOSIS — D509 Iron deficiency anemia, unspecified: Secondary | ICD-10-CM | POA: Diagnosis not present

## 2018-02-28 DIAGNOSIS — N186 End stage renal disease: Secondary | ICD-10-CM | POA: Diagnosis not present

## 2018-02-28 DIAGNOSIS — N2581 Secondary hyperparathyroidism of renal origin: Secondary | ICD-10-CM | POA: Diagnosis not present

## 2018-03-01 DIAGNOSIS — N186 End stage renal disease: Secondary | ICD-10-CM | POA: Diagnosis not present

## 2018-03-01 DIAGNOSIS — N2581 Secondary hyperparathyroidism of renal origin: Secondary | ICD-10-CM | POA: Diagnosis not present

## 2018-03-01 DIAGNOSIS — D509 Iron deficiency anemia, unspecified: Secondary | ICD-10-CM | POA: Diagnosis not present

## 2018-03-02 DIAGNOSIS — N186 End stage renal disease: Secondary | ICD-10-CM | POA: Diagnosis not present

## 2018-03-02 DIAGNOSIS — D509 Iron deficiency anemia, unspecified: Secondary | ICD-10-CM | POA: Diagnosis not present

## 2018-03-02 DIAGNOSIS — Z23 Encounter for immunization: Secondary | ICD-10-CM | POA: Diagnosis not present

## 2018-03-02 DIAGNOSIS — N2581 Secondary hyperparathyroidism of renal origin: Secondary | ICD-10-CM | POA: Diagnosis not present

## 2018-03-02 DIAGNOSIS — D631 Anemia in chronic kidney disease: Secondary | ICD-10-CM | POA: Diagnosis not present

## 2018-03-03 DIAGNOSIS — D631 Anemia in chronic kidney disease: Secondary | ICD-10-CM | POA: Diagnosis not present

## 2018-03-03 DIAGNOSIS — Z23 Encounter for immunization: Secondary | ICD-10-CM | POA: Diagnosis not present

## 2018-03-03 DIAGNOSIS — N2581 Secondary hyperparathyroidism of renal origin: Secondary | ICD-10-CM | POA: Diagnosis not present

## 2018-03-03 DIAGNOSIS — N186 End stage renal disease: Secondary | ICD-10-CM | POA: Diagnosis not present

## 2018-03-03 DIAGNOSIS — D509 Iron deficiency anemia, unspecified: Secondary | ICD-10-CM | POA: Diagnosis not present

## 2018-03-04 DIAGNOSIS — Z23 Encounter for immunization: Secondary | ICD-10-CM | POA: Diagnosis not present

## 2018-03-04 DIAGNOSIS — N2581 Secondary hyperparathyroidism of renal origin: Secondary | ICD-10-CM | POA: Diagnosis not present

## 2018-03-04 DIAGNOSIS — N186 End stage renal disease: Secondary | ICD-10-CM | POA: Diagnosis not present

## 2018-03-04 DIAGNOSIS — D509 Iron deficiency anemia, unspecified: Secondary | ICD-10-CM | POA: Diagnosis not present

## 2018-03-04 DIAGNOSIS — D631 Anemia in chronic kidney disease: Secondary | ICD-10-CM | POA: Diagnosis not present

## 2018-03-05 DIAGNOSIS — N2581 Secondary hyperparathyroidism of renal origin: Secondary | ICD-10-CM | POA: Diagnosis not present

## 2018-03-05 DIAGNOSIS — D631 Anemia in chronic kidney disease: Secondary | ICD-10-CM | POA: Diagnosis not present

## 2018-03-05 DIAGNOSIS — D509 Iron deficiency anemia, unspecified: Secondary | ICD-10-CM | POA: Diagnosis not present

## 2018-03-05 DIAGNOSIS — N186 End stage renal disease: Secondary | ICD-10-CM | POA: Diagnosis not present

## 2018-03-05 DIAGNOSIS — Z23 Encounter for immunization: Secondary | ICD-10-CM | POA: Diagnosis not present

## 2018-03-06 DIAGNOSIS — N2581 Secondary hyperparathyroidism of renal origin: Secondary | ICD-10-CM | POA: Diagnosis not present

## 2018-03-06 DIAGNOSIS — D509 Iron deficiency anemia, unspecified: Secondary | ICD-10-CM | POA: Diagnosis not present

## 2018-03-06 DIAGNOSIS — Z23 Encounter for immunization: Secondary | ICD-10-CM | POA: Diagnosis not present

## 2018-03-06 DIAGNOSIS — N186 End stage renal disease: Secondary | ICD-10-CM | POA: Diagnosis not present

## 2018-03-06 DIAGNOSIS — D631 Anemia in chronic kidney disease: Secondary | ICD-10-CM | POA: Diagnosis not present

## 2018-03-07 DIAGNOSIS — C73 Malignant neoplasm of thyroid gland: Secondary | ICD-10-CM | POA: Diagnosis not present

## 2018-03-07 DIAGNOSIS — Z4932 Encounter for adequacy testing for peritoneal dialysis: Secondary | ICD-10-CM | POA: Diagnosis not present

## 2018-03-07 DIAGNOSIS — N186 End stage renal disease: Secondary | ICD-10-CM | POA: Diagnosis not present

## 2018-03-07 DIAGNOSIS — N2581 Secondary hyperparathyroidism of renal origin: Secondary | ICD-10-CM | POA: Diagnosis not present

## 2018-03-07 DIAGNOSIS — D631 Anemia in chronic kidney disease: Secondary | ICD-10-CM | POA: Diagnosis not present

## 2018-03-07 DIAGNOSIS — E890 Postprocedural hypothyroidism: Secondary | ICD-10-CM | POA: Diagnosis not present

## 2018-03-07 DIAGNOSIS — D509 Iron deficiency anemia, unspecified: Secondary | ICD-10-CM | POA: Diagnosis not present

## 2018-03-07 DIAGNOSIS — Z23 Encounter for immunization: Secondary | ICD-10-CM | POA: Diagnosis not present

## 2018-03-08 DIAGNOSIS — D509 Iron deficiency anemia, unspecified: Secondary | ICD-10-CM | POA: Diagnosis not present

## 2018-03-08 DIAGNOSIS — N2581 Secondary hyperparathyroidism of renal origin: Secondary | ICD-10-CM | POA: Diagnosis not present

## 2018-03-08 DIAGNOSIS — D631 Anemia in chronic kidney disease: Secondary | ICD-10-CM | POA: Diagnosis not present

## 2018-03-08 DIAGNOSIS — N186 End stage renal disease: Secondary | ICD-10-CM | POA: Diagnosis not present

## 2018-03-08 DIAGNOSIS — Z23 Encounter for immunization: Secondary | ICD-10-CM | POA: Diagnosis not present

## 2018-03-09 DIAGNOSIS — N186 End stage renal disease: Secondary | ICD-10-CM | POA: Diagnosis not present

## 2018-03-09 DIAGNOSIS — D509 Iron deficiency anemia, unspecified: Secondary | ICD-10-CM | POA: Diagnosis not present

## 2018-03-09 DIAGNOSIS — Z23 Encounter for immunization: Secondary | ICD-10-CM | POA: Diagnosis not present

## 2018-03-09 DIAGNOSIS — D631 Anemia in chronic kidney disease: Secondary | ICD-10-CM | POA: Diagnosis not present

## 2018-03-09 DIAGNOSIS — N2581 Secondary hyperparathyroidism of renal origin: Secondary | ICD-10-CM | POA: Diagnosis not present

## 2018-03-10 DIAGNOSIS — N186 End stage renal disease: Secondary | ICD-10-CM | POA: Diagnosis not present

## 2018-03-10 DIAGNOSIS — N2581 Secondary hyperparathyroidism of renal origin: Secondary | ICD-10-CM | POA: Diagnosis not present

## 2018-03-10 DIAGNOSIS — D631 Anemia in chronic kidney disease: Secondary | ICD-10-CM | POA: Diagnosis not present

## 2018-03-10 DIAGNOSIS — Z23 Encounter for immunization: Secondary | ICD-10-CM | POA: Diagnosis not present

## 2018-03-10 DIAGNOSIS — D509 Iron deficiency anemia, unspecified: Secondary | ICD-10-CM | POA: Diagnosis not present

## 2018-03-11 DIAGNOSIS — N186 End stage renal disease: Secondary | ICD-10-CM | POA: Diagnosis not present

## 2018-03-11 DIAGNOSIS — D509 Iron deficiency anemia, unspecified: Secondary | ICD-10-CM | POA: Diagnosis not present

## 2018-03-11 DIAGNOSIS — N2581 Secondary hyperparathyroidism of renal origin: Secondary | ICD-10-CM | POA: Diagnosis not present

## 2018-03-11 DIAGNOSIS — D631 Anemia in chronic kidney disease: Secondary | ICD-10-CM | POA: Diagnosis not present

## 2018-03-11 DIAGNOSIS — Z23 Encounter for immunization: Secondary | ICD-10-CM | POA: Diagnosis not present

## 2018-03-12 ENCOUNTER — Ambulatory Visit (HOSPITAL_COMMUNITY): Payer: Medicare PPO | Admitting: Psychiatry

## 2018-03-12 DIAGNOSIS — N186 End stage renal disease: Secondary | ICD-10-CM | POA: Diagnosis not present

## 2018-03-13 DIAGNOSIS — N186 End stage renal disease: Secondary | ICD-10-CM | POA: Diagnosis not present

## 2018-03-14 DIAGNOSIS — N186 End stage renal disease: Secondary | ICD-10-CM | POA: Diagnosis not present

## 2018-03-15 DIAGNOSIS — N186 End stage renal disease: Secondary | ICD-10-CM | POA: Diagnosis not present

## 2018-03-16 DIAGNOSIS — N186 End stage renal disease: Secondary | ICD-10-CM | POA: Diagnosis not present

## 2018-03-17 DIAGNOSIS — N186 End stage renal disease: Secondary | ICD-10-CM | POA: Diagnosis not present

## 2018-03-18 DIAGNOSIS — N186 End stage renal disease: Secondary | ICD-10-CM | POA: Diagnosis not present

## 2018-03-19 DIAGNOSIS — N186 End stage renal disease: Secondary | ICD-10-CM | POA: Diagnosis not present

## 2018-03-20 DIAGNOSIS — I4589 Other specified conduction disorders: Secondary | ICD-10-CM | POA: Diagnosis not present

## 2018-03-20 DIAGNOSIS — R001 Bradycardia, unspecified: Secondary | ICD-10-CM | POA: Diagnosis not present

## 2018-03-20 DIAGNOSIS — E890 Postprocedural hypothyroidism: Secondary | ICD-10-CM | POA: Diagnosis not present

## 2018-03-20 DIAGNOSIS — I120 Hypertensive chronic kidney disease with stage 5 chronic kidney disease or end stage renal disease: Secondary | ICD-10-CM | POA: Diagnosis not present

## 2018-03-20 DIAGNOSIS — E1122 Type 2 diabetes mellitus with diabetic chronic kidney disease: Secondary | ICD-10-CM | POA: Diagnosis not present

## 2018-03-20 DIAGNOSIS — N186 End stage renal disease: Secondary | ICD-10-CM | POA: Diagnosis not present

## 2018-03-20 DIAGNOSIS — Z992 Dependence on renal dialysis: Secondary | ICD-10-CM | POA: Diagnosis not present

## 2018-03-20 DIAGNOSIS — I440 Atrioventricular block, first degree: Secondary | ICD-10-CM | POA: Diagnosis not present

## 2018-03-20 DIAGNOSIS — Z7682 Awaiting organ transplant status: Secondary | ICD-10-CM | POA: Diagnosis not present

## 2018-03-20 DIAGNOSIS — Z01818 Encounter for other preprocedural examination: Secondary | ICD-10-CM | POA: Diagnosis not present

## 2018-03-21 DIAGNOSIS — N186 End stage renal disease: Secondary | ICD-10-CM | POA: Diagnosis not present

## 2018-03-21 DIAGNOSIS — R591 Generalized enlarged lymph nodes: Secondary | ICD-10-CM | POA: Diagnosis not present

## 2018-03-22 DIAGNOSIS — N186 End stage renal disease: Secondary | ICD-10-CM | POA: Diagnosis not present

## 2018-03-23 DIAGNOSIS — N186 End stage renal disease: Secondary | ICD-10-CM | POA: Diagnosis not present

## 2018-03-24 DIAGNOSIS — N186 End stage renal disease: Secondary | ICD-10-CM | POA: Diagnosis not present

## 2018-03-25 DIAGNOSIS — N186 End stage renal disease: Secondary | ICD-10-CM | POA: Diagnosis not present

## 2018-03-26 DIAGNOSIS — N186 End stage renal disease: Secondary | ICD-10-CM | POA: Diagnosis not present

## 2018-03-27 DIAGNOSIS — N186 End stage renal disease: Secondary | ICD-10-CM | POA: Diagnosis not present

## 2018-03-28 DIAGNOSIS — N186 End stage renal disease: Secondary | ICD-10-CM | POA: Diagnosis not present

## 2018-03-29 DIAGNOSIS — N186 End stage renal disease: Secondary | ICD-10-CM | POA: Diagnosis not present

## 2018-03-30 DIAGNOSIS — N186 End stage renal disease: Secondary | ICD-10-CM | POA: Diagnosis not present

## 2018-03-31 ENCOUNTER — Ambulatory Visit (HOSPITAL_COMMUNITY): Payer: Medicare PPO | Admitting: Psychiatry

## 2018-03-31 DIAGNOSIS — N186 End stage renal disease: Secondary | ICD-10-CM | POA: Diagnosis not present

## 2018-04-01 DIAGNOSIS — N186 End stage renal disease: Secondary | ICD-10-CM | POA: Diagnosis not present

## 2018-04-02 DIAGNOSIS — N2581 Secondary hyperparathyroidism of renal origin: Secondary | ICD-10-CM | POA: Diagnosis not present

## 2018-04-02 DIAGNOSIS — D509 Iron deficiency anemia, unspecified: Secondary | ICD-10-CM | POA: Diagnosis not present

## 2018-04-02 DIAGNOSIS — Z23 Encounter for immunization: Secondary | ICD-10-CM | POA: Diagnosis not present

## 2018-04-02 DIAGNOSIS — N186 End stage renal disease: Secondary | ICD-10-CM | POA: Diagnosis not present

## 2018-04-02 DIAGNOSIS — D631 Anemia in chronic kidney disease: Secondary | ICD-10-CM | POA: Diagnosis not present

## 2018-04-03 DIAGNOSIS — N2581 Secondary hyperparathyroidism of renal origin: Secondary | ICD-10-CM | POA: Diagnosis not present

## 2018-04-03 DIAGNOSIS — D631 Anemia in chronic kidney disease: Secondary | ICD-10-CM | POA: Diagnosis not present

## 2018-04-03 DIAGNOSIS — Z23 Encounter for immunization: Secondary | ICD-10-CM | POA: Diagnosis not present

## 2018-04-03 DIAGNOSIS — D509 Iron deficiency anemia, unspecified: Secondary | ICD-10-CM | POA: Diagnosis not present

## 2018-04-03 DIAGNOSIS — N186 End stage renal disease: Secondary | ICD-10-CM | POA: Diagnosis not present

## 2018-04-04 DIAGNOSIS — D509 Iron deficiency anemia, unspecified: Secondary | ICD-10-CM | POA: Diagnosis not present

## 2018-04-04 DIAGNOSIS — D631 Anemia in chronic kidney disease: Secondary | ICD-10-CM | POA: Diagnosis not present

## 2018-04-04 DIAGNOSIS — Z23 Encounter for immunization: Secondary | ICD-10-CM | POA: Diagnosis not present

## 2018-04-04 DIAGNOSIS — N2581 Secondary hyperparathyroidism of renal origin: Secondary | ICD-10-CM | POA: Diagnosis not present

## 2018-04-04 DIAGNOSIS — N186 End stage renal disease: Secondary | ICD-10-CM | POA: Diagnosis not present

## 2018-04-05 DIAGNOSIS — N186 End stage renal disease: Secondary | ICD-10-CM | POA: Diagnosis not present

## 2018-04-05 DIAGNOSIS — N2581 Secondary hyperparathyroidism of renal origin: Secondary | ICD-10-CM | POA: Diagnosis not present

## 2018-04-05 DIAGNOSIS — D509 Iron deficiency anemia, unspecified: Secondary | ICD-10-CM | POA: Diagnosis not present

## 2018-04-05 DIAGNOSIS — Z23 Encounter for immunization: Secondary | ICD-10-CM | POA: Diagnosis not present

## 2018-04-05 DIAGNOSIS — D631 Anemia in chronic kidney disease: Secondary | ICD-10-CM | POA: Diagnosis not present

## 2018-04-06 DIAGNOSIS — Z23 Encounter for immunization: Secondary | ICD-10-CM | POA: Diagnosis not present

## 2018-04-06 DIAGNOSIS — D631 Anemia in chronic kidney disease: Secondary | ICD-10-CM | POA: Diagnosis not present

## 2018-04-06 DIAGNOSIS — N2581 Secondary hyperparathyroidism of renal origin: Secondary | ICD-10-CM | POA: Diagnosis not present

## 2018-04-06 DIAGNOSIS — D509 Iron deficiency anemia, unspecified: Secondary | ICD-10-CM | POA: Diagnosis not present

## 2018-04-06 DIAGNOSIS — N186 End stage renal disease: Secondary | ICD-10-CM | POA: Diagnosis not present

## 2018-04-07 DIAGNOSIS — Z23 Encounter for immunization: Secondary | ICD-10-CM | POA: Diagnosis not present

## 2018-04-07 DIAGNOSIS — D631 Anemia in chronic kidney disease: Secondary | ICD-10-CM | POA: Diagnosis not present

## 2018-04-07 DIAGNOSIS — N2581 Secondary hyperparathyroidism of renal origin: Secondary | ICD-10-CM | POA: Diagnosis not present

## 2018-04-07 DIAGNOSIS — D509 Iron deficiency anemia, unspecified: Secondary | ICD-10-CM | POA: Diagnosis not present

## 2018-04-07 DIAGNOSIS — N186 End stage renal disease: Secondary | ICD-10-CM | POA: Diagnosis not present

## 2018-04-08 DIAGNOSIS — N186 End stage renal disease: Secondary | ICD-10-CM | POA: Diagnosis not present

## 2018-04-08 DIAGNOSIS — D631 Anemia in chronic kidney disease: Secondary | ICD-10-CM | POA: Diagnosis not present

## 2018-04-08 DIAGNOSIS — N2581 Secondary hyperparathyroidism of renal origin: Secondary | ICD-10-CM | POA: Diagnosis not present

## 2018-04-08 DIAGNOSIS — Z23 Encounter for immunization: Secondary | ICD-10-CM | POA: Diagnosis not present

## 2018-04-08 DIAGNOSIS — D509 Iron deficiency anemia, unspecified: Secondary | ICD-10-CM | POA: Diagnosis not present

## 2018-04-09 DIAGNOSIS — D631 Anemia in chronic kidney disease: Secondary | ICD-10-CM | POA: Diagnosis not present

## 2018-04-09 DIAGNOSIS — N2581 Secondary hyperparathyroidism of renal origin: Secondary | ICD-10-CM | POA: Diagnosis not present

## 2018-04-09 DIAGNOSIS — N186 End stage renal disease: Secondary | ICD-10-CM | POA: Diagnosis not present

## 2018-04-09 DIAGNOSIS — D509 Iron deficiency anemia, unspecified: Secondary | ICD-10-CM | POA: Diagnosis not present

## 2018-04-09 DIAGNOSIS — Z23 Encounter for immunization: Secondary | ICD-10-CM | POA: Diagnosis not present

## 2018-04-09 DIAGNOSIS — E119 Type 2 diabetes mellitus without complications: Secondary | ICD-10-CM | POA: Diagnosis not present

## 2018-04-10 DIAGNOSIS — D509 Iron deficiency anemia, unspecified: Secondary | ICD-10-CM | POA: Diagnosis not present

## 2018-04-10 DIAGNOSIS — N2581 Secondary hyperparathyroidism of renal origin: Secondary | ICD-10-CM | POA: Diagnosis not present

## 2018-04-10 DIAGNOSIS — Z23 Encounter for immunization: Secondary | ICD-10-CM | POA: Diagnosis not present

## 2018-04-10 DIAGNOSIS — N186 End stage renal disease: Secondary | ICD-10-CM | POA: Diagnosis not present

## 2018-04-10 DIAGNOSIS — D631 Anemia in chronic kidney disease: Secondary | ICD-10-CM | POA: Diagnosis not present

## 2018-04-11 DIAGNOSIS — D631 Anemia in chronic kidney disease: Secondary | ICD-10-CM | POA: Diagnosis not present

## 2018-04-11 DIAGNOSIS — D509 Iron deficiency anemia, unspecified: Secondary | ICD-10-CM | POA: Diagnosis not present

## 2018-04-11 DIAGNOSIS — N186 End stage renal disease: Secondary | ICD-10-CM | POA: Diagnosis not present

## 2018-04-11 DIAGNOSIS — N2581 Secondary hyperparathyroidism of renal origin: Secondary | ICD-10-CM | POA: Diagnosis not present

## 2018-04-11 DIAGNOSIS — Z23 Encounter for immunization: Secondary | ICD-10-CM | POA: Diagnosis not present

## 2018-04-12 DIAGNOSIS — N186 End stage renal disease: Secondary | ICD-10-CM | POA: Diagnosis not present

## 2018-04-13 DIAGNOSIS — N186 End stage renal disease: Secondary | ICD-10-CM | POA: Diagnosis not present

## 2018-04-14 DIAGNOSIS — N186 End stage renal disease: Secondary | ICD-10-CM | POA: Diagnosis not present

## 2018-04-15 ENCOUNTER — Ambulatory Visit: Payer: Medicare PPO | Admitting: Cardiology

## 2018-04-15 ENCOUNTER — Encounter: Payer: Self-pay | Admitting: Cardiology

## 2018-04-15 VITALS — BP 156/85 | HR 56 | Ht 71.0 in | Wt 222.6 lb

## 2018-04-15 DIAGNOSIS — Z87898 Personal history of other specified conditions: Secondary | ICD-10-CM | POA: Diagnosis not present

## 2018-04-15 DIAGNOSIS — N186 End stage renal disease: Secondary | ICD-10-CM | POA: Diagnosis not present

## 2018-04-15 DIAGNOSIS — I10 Essential (primary) hypertension: Secondary | ICD-10-CM | POA: Diagnosis not present

## 2018-04-15 MED ORDER — HYDRALAZINE HCL 25 MG PO TABS: 25.0000 mg | ORAL_TABLET | Freq: Two times a day (BID) | ORAL | 1 refills | Status: DC

## 2018-04-15 NOTE — Progress Notes (Signed)
Clinical Summary April Ward is a 58 y.o.female seen today for follow up of the following medical problems.   1. Syncope - episode on Monday while at home in kitchen at [place] dizzy. Felt dizzy. She reports heart rate was around 48. Fell to floor. LOC for just a few seconds. No other episodes - had been feeling well prior.  - Poor oral intake that Sunday.  - Cr 3.10, K 3.3. Hgb 10.9, MCV 73,   - episode of dizzienss in October resolved, no recurrent syncope    2. HTN  - reports diagnosed with HTN age 58. Historically has been difficult to control - CT scan in 2006 with normal renal arteries. TSH 1.80. No EtoH. Normal renin and aldo ratio. Normal sleep study 10/2013 at [facility].  - since recent gastric sleeve surgery she has had significant weight loss, bp's have trended down and she has been taken off some of her bp meds  - home bp's 140s-160s.  - her nephrologist previously lowered her ARB  3. Obesity - had gastric sleeve procedure done, significant weight loss since that time.   4. ESRD - followed Dr Kris Mouton at [facility].  - July 2016 Cr 2.62, BUN 40 - being evaluated for kidney transplant - started end of October.   - upcoming transplan evaluation at [facility] with echo and stress test for tomorrow.    5. History of renal cell CA - s/p left partial nephrectomy in 2014      Past Medical History:  Diagnosis Date  . Anxiety   . Arthritis   . Depression   . Diabetes mellitus without complication ([place])   . GERD (gastroesophageal reflux disease)   . Gout   . Heart murmur   . Hypertension   . Renal cancer ([place])   . Renal disease 10/2012  . Renal insufficiency      Allergies  Allergen Reactions  . Skelaxin [Metaxalone] Hives and Rash     Current Outpatient Medications  Medication Sig Dispense Refill  . ALPRAZolam (XANAX) 1 MG tablet Take 1 tablet (1 mg total) by mouth 4 (four) times daily. 120 tablet 2  . amLODipine (NORVASC) 5 MG tablet Take 1  tablet (5 mg total) by mouth daily. 90 tablet 3  . buPROPion (WELLBUTRIN XL) 300 MG 24 hr tablet Take 1 tablet (300 mg total) by mouth every morning. 60 tablet 2  . calcium citrate-vitamin D 500-400 MG-UNIT chewable tablet Chew 1 tablet by mouth 3 (three) times daily.    . furosemide (LASIX) 40 MG tablet Take 40 mg by mouth as needed.    Marland Kitchen levothyroxine (SYNTHROID, LEVOTHROID) 200 MCG tablet Take 200 mcg by mouth daily before breakfast.    . losartan (COZAAR) 50 MG tablet Take 100 mg by mouth daily.     . meclizine (ANTIVERT) 25 MG tablet Take 1 tablet (25 mg total) by mouth every 8 (eight) hours as needed for dizziness. 90 tablet 0  . Multiple Vitamin (MULTIVITAMIN) tablet Take 1 tablet by mouth 3 (three) times daily.     . naltrexone (DEPADE) 50 MG tablet     . nystatin (MYCOSTATIN/NYSTOP) powder as needed.    Marland Kitchen omeprazole (PRILOSEC) 20 MG capsule Take 20 mg by mouth daily.    . prazosin (MINIPRESS) 5 MG capsule Take 1 capsule (5 mg total) by mouth at bedtime. 90 capsule 2  . sevelamer carbonate (RENVELA) 800 MG tablet Take by mouth.    . temazepam (RESTORIL) 30 MG capsule  Take 1 capsule (30 mg total) by mouth at bedtime as needed. for sleep 30 capsule 2  . vitamin B-12 (CYANOCOBALAMIN) 500 MCG tablet Take 500 mcg by mouth daily.     No current facility-administered medications for this visit.      Past Surgical History:  Procedure Laterality Date  . CESAREAN SECTION  [id number]  . CHONDROPLASTY Right 08/22/2012   Procedure: CHONDROPLASTY;  Surgeon: Carole Civil, MD;  Location: [facility];  Service: Orthopedics;  Laterality: Right;  . COLONOSCOPY WITH PROPOFOL N/A 03/16/2014   Procedure: COLONOSCOPY WITH PROPOFOL (at cecum 1023, total withdrawal time=9 minutes);  Surgeon: Danie Binder, MD;  Location: [facility];  Service: Endoscopy;  Laterality: N/A;  . GASTRIC BYPASS    . KNEE ARTHROSCOPY WITH MEDIAL MENISECTOMY Right 08/22/2012   Procedure: KNEE ARTHROSCOPY WITH PARTIAL MEDIAL  MENISECTOMY;  Surgeon: Carole Civil, MD;  Location: [facility];  Service: Orthopedics;  Laterality: Right;  . PARTIAL NEPHRECTOMY  Dec 2014   left  . TENDON REPAIR       Allergies  Allergen Reactions  . Skelaxin [Metaxalone] Hives and Rash      Family History  Problem Relation Age of Onset  . Heart attack Mother   . Heart attack Father   . Depression Paternal Aunt   . Alcohol abuse Maternal Uncle   . Colon cancer Maternal Aunt   . Colon cancer Maternal Uncle      Social History [name] reports that she has never smoked. She has never used smokeless tobacco. [name] reports no history of alcohol use.   Review of Systems CONSTITUTIONAL: No weight loss, fever, chills, weakness or fatigue.  HEENT: Eyes: No visual loss, blurred vision, double vision or yellow sclerae.No hearing loss, sneezing, congestion, runny nose or sore throat.  SKIN: No rash or itching.  CARDIOVASCULAR: no chest pain, no palpitations.  RESPIRATORY: No shortness of breath, cough or sputum.  GASTROINTESTINAL: No anorexia, nausea, vomiting or diarrhea. No abdominal pain or blood.  GENITOURINARY: No burning on urination, no polyuria NEUROLOGICAL: No headache, dizziness, syncope, paralysis, ataxia, numbness or tingling in the extremities. No change in bowel or bladder control.  MUSCULOSKELETAL: No muscle, back pain, joint pain or stiffness.  LYMPHATICS: No enlarged nodes. No history of splenectomy.  PSYCHIATRIC: No history of depression or anxiety.  ENDOCRINOLOGIC: No reports of sweating, cold or heat intolerance. No polyuria or polydipsia.  Marland Kitchen   Physical Examination Vitals:   04/15/18 0900  BP: (!) 156/85  Pulse: (!) 56  SpO2: 100%   Vitals:   04/15/18 0900  Weight: 222 lb 9.6 oz (101 kg)  Height: 5\' 11"  (1.803 m)    Gen: resting comfortably, no acute distress HEENT: no scleral icterus, pupils equal round and reactive, no palptable cervical adenopathy,  CV: RRR, 3/6 systolic murmur rusb, no  jvd Resp: Clear to auscultation bilaterally GI: abdomen is soft, non-tender, non-distended, normal bowel sounds, no hepatosplenomegaly MSK: extremities are warm, no edema.  Skin: warm, no rash Neuro:  no focal deficits Psych: appropriate affect   Diagnostic Studies  09/2013 Carotid US 1-39% bilateral ICA disease  09/2013 Exercise MPI Overall low risk exercise/Lexiscan Cardiolite. Patient had limited exercise capacity achieving maximum work load of 7 METS, limited by shortness of breath and hypertensive response. There were no clearly diagnostic ST segment abnormalities. Occasional to frequent PVCs and ventricular couplets were noted early in exercise. There were no sustained arrhythmias. Perfusion imaging shows probable variable soft tissue attenuation, less likely a  minor degree of basal lateral ischemia. LVEF is normal at 61% with upper normal chamber volume and no obvious wall motion abnormalities.    Assessment and Plan   1.Syncope - probable orthostatic syncope. SBP drops 18 ponts today in lcinic with standing, diastolic drops 11 points.  - no recurrence, continue to monitor  2. HTN - above goal. Limited by her renal dysfunction and bradycardia. - start hydaralinze 25mg  bid, submit bp log in 2 weeks  3. Heart murmur - upcoming echo at [facility] for transplant eval   4. ESRD - ongoing transplant evaluation at [facility], had echo and cardiac stress test planned  Arnoldo Lenis, M.D

## 2018-04-15 NOTE — Patient Instructions (Signed)
Your physician wants you to follow-up in: [place] will receive a reminder letter in the mail two months in advance. If you don't receive a letter, please call our office to schedule the follow-up appointment.  Your physician has recommended you make the following change in your medication:   START HYDRALAZINE 25 MG TWICE DAILY   UPDATE [place] IN 1 WEEK ON YOUR BLOOD PRESSURE  Thank you for choosing [facility]!!

## 2018-04-16 DIAGNOSIS — N186 End stage renal disease: Secondary | ICD-10-CM | POA: Diagnosis not present

## 2018-04-16 DIAGNOSIS — Z01818 Encounter for other preprocedural examination: Secondary | ICD-10-CM | POA: Diagnosis not present

## 2018-04-16 DIAGNOSIS — I517 Cardiomegaly: Secondary | ICD-10-CM | POA: Diagnosis not present

## 2018-04-16 DIAGNOSIS — R9431 Abnormal electrocardiogram [ECG] [EKG]: Secondary | ICD-10-CM | POA: Diagnosis not present

## 2018-04-16 DIAGNOSIS — I340 Nonrheumatic mitral (valve) insufficiency: Secondary | ICD-10-CM | POA: Diagnosis not present

## 2018-04-16 DIAGNOSIS — Z79899 Other long term (current) drug therapy: Secondary | ICD-10-CM | POA: Diagnosis not present

## 2018-04-17 ENCOUNTER — Other Ambulatory Visit (HOSPITAL_COMMUNITY): Payer: Self-pay | Admitting: Psychiatry

## 2018-04-17 ENCOUNTER — Telehealth (HOSPITAL_COMMUNITY): Payer: Self-pay | Admitting: *Deleted

## 2018-04-17 DIAGNOSIS — N186 End stage renal disease: Secondary | ICD-10-CM | POA: Diagnosis not present

## 2018-04-17 MED ORDER — PRAZOSIN HCL 5 MG PO CAPS: 5.0000 mg | ORAL_CAPSULE | Freq: Every day | ORAL | 2 refills | Status: DC

## 2018-04-17 MED ORDER — ALPRAZOLAM 1 MG PO TABS: 1.0000 mg | ORAL_TABLET | Freq: Four times a day (QID) | ORAL | 2 refills | Status: DC

## 2018-04-17 MED ORDER — BUPROPION HCL ER (XL) 300 MG PO TB24: 300.0000 mg | ORAL_TABLET | ORAL | 2 refills | Status: DC

## 2018-04-17 MED ORDER — TEMAZEPAM 30 MG PO CAPS: 30.0000 mg | ORAL_CAPSULE | Freq: Every evening | ORAL | 2 refills | Status: DC | PRN

## 2018-04-17 NOTE — Telephone Encounter (Signed)
sent 

## 2018-04-17 NOTE — Telephone Encounter (Signed)
Dr Harrington Challenger Patient called & stated that Mcarthur Rossetti is now her Primary Rx & that a script for the Restoril & all her Psych Med's  would need to be forward to them.

## 2018-04-18 DIAGNOSIS — N186 End stage renal disease: Secondary | ICD-10-CM | POA: Diagnosis not present

## 2018-04-19 DIAGNOSIS — N186 End stage renal disease: Secondary | ICD-10-CM | POA: Diagnosis not present

## 2018-04-20 DIAGNOSIS — N186 End stage renal disease: Secondary | ICD-10-CM | POA: Diagnosis not present

## 2018-04-21 ENCOUNTER — Telehealth: Payer: Self-pay | Admitting: Cardiology

## 2018-04-21 DIAGNOSIS — N186 End stage renal disease: Secondary | ICD-10-CM | POA: Diagnosis not present

## 2018-04-22 ENCOUNTER — Encounter (HOSPITAL_COMMUNITY): Payer: Self-pay | Admitting: Psychiatry

## 2018-04-22 ENCOUNTER — Ambulatory Visit (INDEPENDENT_AMBULATORY_CARE_PROVIDER_SITE_OTHER): Payer: Medicare PPO | Admitting: Psychiatry

## 2018-04-22 VITALS — BP 150/84 | HR 61 | Ht 71.0 in | Wt 223.0 lb

## 2018-04-22 DIAGNOSIS — F322 Major depressive disorder, single episode, severe without psychotic features: Secondary | ICD-10-CM | POA: Diagnosis not present

## 2018-04-22 DIAGNOSIS — N186 End stage renal disease: Secondary | ICD-10-CM | POA: Diagnosis not present

## 2018-04-22 DIAGNOSIS — F411 Generalized anxiety disorder: Secondary | ICD-10-CM | POA: Diagnosis not present

## 2018-04-22 DIAGNOSIS — D509 Iron deficiency anemia, unspecified: Secondary | ICD-10-CM | POA: Diagnosis not present

## 2018-04-22 MED ORDER — TEMAZEPAM 30 MG PO CAPS: 30.0000 mg | ORAL_CAPSULE | Freq: Every evening | ORAL | 2 refills | Status: DC | PRN

## 2018-04-22 MED ORDER — PRAZOSIN HCL 5 MG PO CAPS: 5.0000 mg | ORAL_CAPSULE | Freq: Every day | ORAL | 2 refills | Status: DC

## 2018-04-22 MED ORDER — ALPRAZOLAM 1 MG PO TABS: 1.0000 mg | ORAL_TABLET | Freq: Four times a day (QID) | ORAL | 2 refills | Status: DC

## 2018-04-22 MED ORDER — BUPROPION HCL ER (XL) 300 MG PO TB24: 300.0000 mg | ORAL_TABLET | ORAL | 2 refills | Status: DC

## 2018-04-22 NOTE — Progress Notes (Signed)
[place] MD/PA/NP OP Progress Note  04/22/2018 10:09 AM April Ward  MRN:  [id number]  Chief Complaint:  Chief Complaint    Depression; Anxiety; Follow-up     HPI: This patient is a 58 year old widowed black female who lives with her son in [place].  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  The patient returns after 2 months for follow-up of depression and anxiety.  She is still doing her nightly peritoneal dialysis.  She states that since she had her shunt put in her son has declined in terms of his mood.  He is extremely depressed and withdrawn.  He is having severe anxiety and panic attacks.  His primary doctor has sent him for therapy but it is not helping.  Apparently he has been referred here to see Dr. Modesta Messing but she is very worried about him because he has made suicidal threats at times.  The patient herself is doing fairly well except for her worry about her son.  She has been compliant with the dialysis and now is a candidate for possible renal transplant.  She is not sleeping well because she hears her son moving around at night and worries about what he is doing.  They do not have any firearms in the home.  She is more active now and is allowed to do walking and light weightlifting. Visit Diagnosis:    ICD-10-CM   1. Major depressive disorder, single episode, severe without psychotic features ([place]) F32.2   2. GAD (generalized anxiety disorder) F41.1     Past Psychiatric History: none  Past Medical History:  Past Medical History:  Diagnosis Date  . Anxiety   . Arthritis   . Depression   . Diabetes mellitus without complication ([place])   . GERD (gastroesophageal reflux disease)   . Gout   . Heart murmur   . Hypertension   . Renal cancer ([place])   . Renal disease 10/2012  . Renal insufficiency     Past Surgical History:  Procedure Laterality Date  . CESAREAN SECTION  [id number]  . CHONDROPLASTY Right 08/22/2012   Procedure: CHONDROPLASTY;  Surgeon:  Carole Civil, MD;  Location: [facility];  Service: Orthopedics;  Laterality: Right;  . COLONOSCOPY WITH PROPOFOL N/A 03/16/2014   Procedure: COLONOSCOPY WITH PROPOFOL (at cecum 1023, total withdrawal time=9 minutes);  Surgeon: Danie Binder, MD;  Location: [facility];  Service: Endoscopy;  Laterality: N/A;  . GASTRIC BYPASS    . KNEE ARTHROSCOPY WITH MEDIAL MENISECTOMY Right 08/22/2012   Procedure: KNEE ARTHROSCOPY WITH PARTIAL MEDIAL MENISECTOMY;  Surgeon: Carole Civil, MD;  Location: [facility];  Service: Orthopedics;  Laterality: Right;  . PARTIAL NEPHRECTOMY  Dec 2014   left  . TENDON REPAIR      Family Psychiatric History: See below  Family History:  Family History  Problem Relation Age of Onset  . Heart attack Mother   . Heart attack Father   . Depression Paternal Aunt   . Alcohol abuse Maternal Uncle   . Colon cancer Maternal Aunt   . Colon cancer Maternal Uncle     Social History:  Social History   Socioeconomic History  . Marital status: Married    Spouse name: Not on file  . Number of children: Not on file  . Years of education: Not on file  . Highest education level: Not on file  Occupational History  . Occupation: Visual merchandiser: [employer]  Social Needs  . Financial resource strain: Not on file  . Food insecurity:    Worry: Not on file    Inability: Not on file  . Transportation needs:    Medical: Not on file    Non-medical: Not on file  Tobacco Use  . Smoking status: Never Smoker  . Smokeless tobacco: Never Used  Substance and Sexual Activity  . Alcohol use: No    Alcohol/week: 0.0 standard drinks  . Drug use: No  . Sexual activity: Not on file  Lifestyle  . Physical activity:    Days per week: Not on file    Minutes per session: Not on file  . Stress: Not on file  Relationships  . Social connections:    Talks on phone: Not on file    Gets together: Not on file    Attends religious service: Not on file    Active  member of club or organization: Not on file    Attends meetings of clubs or organizations: Not on file    Relationship status: Not on file  Other Topics Concern  . Not on file  Social History Narrative  . Not on file    Allergies:  Allergies  Allergen Reactions  . Skelaxin [Metaxalone] Hives and Rash    Metabolic Disorder Labs: No results found for: HGBA1C, MPG No results found for: PROLACTIN Lab Results  Component Value Date   CHOL 215 (H) 03/11/2014   TRIG 288 (H) 03/11/2014   HDL 33 (L) 03/11/2014   CHOLHDL 6.5 03/11/2014   VLDL 58 (H) 03/11/2014   LDLCALC 124 (H) 03/11/2014   No results found for: TSH  Therapeutic Level Labs: No results found for: LITHIUM No results found for: VALPROATE No components found for:  CBMZ  Current Medications: Current Outpatient Medications  Medication Sig Dispense Refill  . ALPRAZolam (XANAX) 1 MG tablet Take 1 tablet (1 mg total) by mouth 4 (four) times daily. 120 tablet 2  . amLODipine (NORVASC) 10 MG tablet Take 1 tablet by mouth daily.    Marland Kitchen buPROPion (WELLBUTRIN XL) 300 MG 24 hr tablet Take 1 tablet (300 mg total) by mouth every morning. 90 tablet 2  . calcitRIOL (ROCALTROL) 0.25 MCG capsule Take 1 capsule by mouth 3 (three) times a week. M-W-F    . hydrALAZINE (APRESOLINE) 25 MG tablet Take 1 tablet (25 mg total) by mouth 2 (two) times daily. 180 tablet 1  . lactulose (CONSTULOSE) 10 GM/15ML solution Take 10 mLs by mouth 2 (two) times daily as needed for constipation.    Marland Kitchen levothyroxine (SYNTHROID, LEVOTHROID) 200 MCG tablet Take 1 tablet by mouth daily.    Marland Kitchen losartan (COZAAR) 50 MG tablet Take 25 mg by mouth daily.    . meclizine (ANTIVERT) 25 MG tablet Take 1 tablet (25 mg total) by mouth every 8 (eight) hours as needed for dizziness. 90 tablet 0  . multivitamin (RENA-VIT) TABS tablet Take 1 tablet by mouth daily.    . prazosin (MINIPRESS) 5 MG capsule Take 1 capsule (5 mg total) by mouth at bedtime. 90 capsule 2  . sevelamer  carbonate (RENVELA) 800 MG tablet Take 800 mg by mouth daily.     . temazepam (RESTORIL) 30 MG capsule Take 1 capsule (30 mg total) by mouth at bedtime as needed. for sleep 30 capsule 2  . vitamin B-12 (CYANOCOBALAMIN) 500 MCG tablet Take 500 mcg by mouth daily.     No current facility-administered medications for this visit.  Musculoskeletal: Strength & Muscle Tone: within normal limits Gait & Station: normal Patient leans: N/A  Psychiatric Specialty Exam: Review of Systems  Psychiatric/Behavioral: The patient is nervous/anxious.   All other systems reviewed and are negative.   Blood pressure (!) 150/84, pulse 61, height 5\' 11"  (1.803 m), weight 223 lb (101.2 kg), SpO2 99 %.Body mass index is 31.1 kg/m.  General Appearance: Casual, Neat and Well Groomed  Eye Contact:  Good  Speech:  Clear and Coherent  Volume:  Normal  Mood:  Anxious  Affect:  Constricted  Thought Process:  Goal Directed  Orientation:  Full (Time, Place, and Person)  Thought Content: Rumination   Suicidal Thoughts:  No  Homicidal Thoughts:  No  Memory:  Immediate;   Good Recent;   Good Remote;   Good  Judgement:  Good  Insight:  Good  Psychomotor Activity:  Decreased  Concentration:  Concentration: Fair and Attention Span: Fair  Recall:  Good  Fund of Knowledge: Good  Language: Good  Akathisia:  No  Handed:  Right  AIMS (if indicated): not done  Assets:  Communication Skills Desire for Improvement Resilience Social Support Talents/Skills  ADL's:  Intact  Cognition: WNL  Sleep:  Fair   Screenings:   Assessment and Plan: This patient is a 58 year old female with a history of end-stage renal disease depression and anxiety.  She has been through a lot of stress given that her husband died over a year ago and her sister died recently and now her son is dealing with depression.  She is holding her own so far and we will try to get her son some help.  She will continue Wellbutrin XL 300 mg daily  for depression, Xanax 1 mg 4 times daily as needed for anxiety, prazosin 5 mg at bedtime for nightmares and Restoril 30 mg at bedtime as needed for sleep.  She will return to see me in 2 months   [name], MD 04/22/2018, 10:09 AM

## 2018-04-23 DIAGNOSIS — D509 Iron deficiency anemia, unspecified: Secondary | ICD-10-CM | POA: Diagnosis not present

## 2018-04-23 DIAGNOSIS — N186 End stage renal disease: Secondary | ICD-10-CM | POA: Diagnosis not present

## 2018-04-24 DIAGNOSIS — N186 End stage renal disease: Secondary | ICD-10-CM | POA: Diagnosis not present

## 2018-04-24 DIAGNOSIS — D509 Iron deficiency anemia, unspecified: Secondary | ICD-10-CM | POA: Diagnosis not present

## 2018-04-25 ENCOUNTER — Encounter: Payer: Self-pay | Admitting: Podiatry

## 2018-04-25 ENCOUNTER — Ambulatory Visit (INDEPENDENT_AMBULATORY_CARE_PROVIDER_SITE_OTHER): Payer: Medicare PPO | Admitting: Podiatry

## 2018-04-25 DIAGNOSIS — M2012 Hallux valgus (acquired), left foot: Secondary | ICD-10-CM | POA: Diagnosis not present

## 2018-04-25 DIAGNOSIS — M2040 Other hammer toe(s) (acquired), unspecified foot: Secondary | ICD-10-CM

## 2018-04-25 DIAGNOSIS — N186 End stage renal disease: Secondary | ICD-10-CM | POA: Diagnosis not present

## 2018-04-25 DIAGNOSIS — E1142 Type 2 diabetes mellitus with diabetic polyneuropathy: Secondary | ICD-10-CM | POA: Diagnosis not present

## 2018-04-25 DIAGNOSIS — Q828 Other specified congenital malformations of skin: Secondary | ICD-10-CM

## 2018-04-25 DIAGNOSIS — M2011 Hallux valgus (acquired), right foot: Secondary | ICD-10-CM | POA: Diagnosis not present

## 2018-04-25 DIAGNOSIS — D509 Iron deficiency anemia, unspecified: Secondary | ICD-10-CM | POA: Diagnosis not present

## 2018-04-25 NOTE — Progress Notes (Signed)
This patient presents to the office stating she has severe pain from the calluses on both feet.  She says these calluses are painful walking and wearing her shoes.  She is a diet controlled diabetic.  She is also interested in diabetic shoes.  She presents to the office for evaluation and treatment. She has history of foot surgery right foot in 2014.  Vascular  Dorsalis pedis and posterior tibial pulses are palpable  B/L.  Capillary return  WNL.  Temperature gradient is  WNL.  Skin turgor  WNL  Sensorium  Senn Weinstein monofilament wire diminished.. Diminished  tactile sensation.  Nail Exam  Patient has normal nails with no evidence of bacterial or fungal infection.  Orthopedic  Exam  Muscle tone and muscle strength  WNL.  No limitations of motion feet  B/L.  No crepitus or joint effusion noted.  Foot type is unremarkable and digits show no abnormalities.  HAV  B/L.  Hammer toes 2-5  B/L. Plantar flexed second and third metatarsals left foot.  Skin  No open lesions.  Normal skin texture and turgor.  Porokeratosis sub 2,3 left foot.  Painful pinch callus right foot  Porokeratosis left foot.  Plantar flexed metatarsal left foot.  Patient qualifies for diabetic shoes due to DPN and HAV  B/L and hammer toes  B/L.  IE  Debride porokeratosis/callus  B/L. Patient was brought to [facility] for diabetic shoes.  RTC prn   Gardiner Barefoot DPM

## 2018-04-26 DIAGNOSIS — D509 Iron deficiency anemia, unspecified: Secondary | ICD-10-CM | POA: Diagnosis not present

## 2018-04-26 DIAGNOSIS — N186 End stage renal disease: Secondary | ICD-10-CM | POA: Diagnosis not present

## 2018-04-27 DIAGNOSIS — D509 Iron deficiency anemia, unspecified: Secondary | ICD-10-CM | POA: Diagnosis not present

## 2018-04-27 DIAGNOSIS — N186 End stage renal disease: Secondary | ICD-10-CM | POA: Diagnosis not present

## 2018-04-28 DIAGNOSIS — D509 Iron deficiency anemia, unspecified: Secondary | ICD-10-CM | POA: Diagnosis not present

## 2018-04-28 DIAGNOSIS — N186 End stage renal disease: Secondary | ICD-10-CM | POA: Diagnosis not present

## 2018-04-29 DIAGNOSIS — D509 Iron deficiency anemia, unspecified: Secondary | ICD-10-CM | POA: Diagnosis not present

## 2018-04-29 DIAGNOSIS — N186 End stage renal disease: Secondary | ICD-10-CM | POA: Diagnosis not present

## 2018-04-30 DIAGNOSIS — D509 Iron deficiency anemia, unspecified: Secondary | ICD-10-CM | POA: Diagnosis not present

## 2018-04-30 DIAGNOSIS — N186 End stage renal disease: Secondary | ICD-10-CM | POA: Diagnosis not present

## 2018-04-30 NOTE — Telephone Encounter (Signed)
Please increase hydralazine to 50mg  bid and update [place] next week on bp's   Zandra Abts MD

## 2018-05-01 DIAGNOSIS — N186 End stage renal disease: Secondary | ICD-10-CM | POA: Diagnosis not present

## 2018-05-01 DIAGNOSIS — D509 Iron deficiency anemia, unspecified: Secondary | ICD-10-CM | POA: Diagnosis not present

## 2018-05-02 ENCOUNTER — Other Ambulatory Visit: Payer: Self-pay | Admitting: *Deleted

## 2018-05-02 DIAGNOSIS — N186 End stage renal disease: Secondary | ICD-10-CM | POA: Diagnosis not present

## 2018-05-02 DIAGNOSIS — D509 Iron deficiency anemia, unspecified: Secondary | ICD-10-CM | POA: Diagnosis not present

## 2018-05-02 DIAGNOSIS — Z992 Dependence on renal dialysis: Secondary | ICD-10-CM | POA: Diagnosis not present

## 2018-05-02 MED ORDER — HYDRALAZINE HCL 50 MG PO TABS: 50.0000 mg | ORAL_TABLET | Freq: Two times a day (BID) | ORAL | 1 refills | Status: DC

## 2018-05-03 DIAGNOSIS — Z23 Encounter for immunization: Secondary | ICD-10-CM | POA: Diagnosis not present

## 2018-05-03 DIAGNOSIS — N186 End stage renal disease: Secondary | ICD-10-CM | POA: Diagnosis not present

## 2018-05-03 DIAGNOSIS — D509 Iron deficiency anemia, unspecified: Secondary | ICD-10-CM | POA: Diagnosis not present

## 2018-05-03 DIAGNOSIS — D631 Anemia in chronic kidney disease: Secondary | ICD-10-CM | POA: Diagnosis not present

## 2018-05-03 DIAGNOSIS — N2581 Secondary hyperparathyroidism of renal origin: Secondary | ICD-10-CM | POA: Diagnosis not present

## 2018-05-04 DIAGNOSIS — D509 Iron deficiency anemia, unspecified: Secondary | ICD-10-CM | POA: Diagnosis not present

## 2018-05-04 DIAGNOSIS — N2581 Secondary hyperparathyroidism of renal origin: Secondary | ICD-10-CM | POA: Diagnosis not present

## 2018-05-04 DIAGNOSIS — D631 Anemia in chronic kidney disease: Secondary | ICD-10-CM | POA: Diagnosis not present

## 2018-05-04 DIAGNOSIS — N186 End stage renal disease: Secondary | ICD-10-CM | POA: Diagnosis not present

## 2018-05-04 DIAGNOSIS — Z23 Encounter for immunization: Secondary | ICD-10-CM | POA: Diagnosis not present

## 2018-05-05 DIAGNOSIS — Z23 Encounter for immunization: Secondary | ICD-10-CM | POA: Diagnosis not present

## 2018-05-05 DIAGNOSIS — N186 End stage renal disease: Secondary | ICD-10-CM | POA: Diagnosis not present

## 2018-05-05 DIAGNOSIS — D509 Iron deficiency anemia, unspecified: Secondary | ICD-10-CM | POA: Diagnosis not present

## 2018-05-05 DIAGNOSIS — D631 Anemia in chronic kidney disease: Secondary | ICD-10-CM | POA: Diagnosis not present

## 2018-05-05 DIAGNOSIS — N2581 Secondary hyperparathyroidism of renal origin: Secondary | ICD-10-CM | POA: Diagnosis not present

## 2018-05-06 DIAGNOSIS — D631 Anemia in chronic kidney disease: Secondary | ICD-10-CM | POA: Diagnosis not present

## 2018-05-06 DIAGNOSIS — N186 End stage renal disease: Secondary | ICD-10-CM | POA: Diagnosis not present

## 2018-05-06 DIAGNOSIS — Z23 Encounter for immunization: Secondary | ICD-10-CM | POA: Diagnosis not present

## 2018-05-06 DIAGNOSIS — N2581 Secondary hyperparathyroidism of renal origin: Secondary | ICD-10-CM | POA: Diagnosis not present

## 2018-05-06 DIAGNOSIS — D509 Iron deficiency anemia, unspecified: Secondary | ICD-10-CM | POA: Diagnosis not present

## 2018-05-07 DIAGNOSIS — D631 Anemia in chronic kidney disease: Secondary | ICD-10-CM | POA: Diagnosis not present

## 2018-05-07 DIAGNOSIS — D509 Iron deficiency anemia, unspecified: Secondary | ICD-10-CM | POA: Diagnosis not present

## 2018-05-07 DIAGNOSIS — N2581 Secondary hyperparathyroidism of renal origin: Secondary | ICD-10-CM | POA: Diagnosis not present

## 2018-05-07 DIAGNOSIS — Z23 Encounter for immunization: Secondary | ICD-10-CM | POA: Diagnosis not present

## 2018-05-07 DIAGNOSIS — Z4932 Encounter for adequacy testing for peritoneal dialysis: Secondary | ICD-10-CM | POA: Diagnosis not present

## 2018-05-07 DIAGNOSIS — N186 End stage renal disease: Secondary | ICD-10-CM | POA: Diagnosis not present

## 2018-05-08 DIAGNOSIS — Z23 Encounter for immunization: Secondary | ICD-10-CM | POA: Diagnosis not present

## 2018-05-08 DIAGNOSIS — D631 Anemia in chronic kidney disease: Secondary | ICD-10-CM | POA: Diagnosis not present

## 2018-05-08 DIAGNOSIS — N2581 Secondary hyperparathyroidism of renal origin: Secondary | ICD-10-CM | POA: Diagnosis not present

## 2018-05-08 DIAGNOSIS — N186 End stage renal disease: Secondary | ICD-10-CM | POA: Diagnosis not present

## 2018-05-08 DIAGNOSIS — D509 Iron deficiency anemia, unspecified: Secondary | ICD-10-CM | POA: Diagnosis not present

## 2018-05-09 DIAGNOSIS — N2581 Secondary hyperparathyroidism of renal origin: Secondary | ICD-10-CM | POA: Diagnosis not present

## 2018-05-09 DIAGNOSIS — D509 Iron deficiency anemia, unspecified: Secondary | ICD-10-CM | POA: Diagnosis not present

## 2018-05-09 DIAGNOSIS — D631 Anemia in chronic kidney disease: Secondary | ICD-10-CM | POA: Diagnosis not present

## 2018-05-09 DIAGNOSIS — N186 End stage renal disease: Secondary | ICD-10-CM | POA: Diagnosis not present

## 2018-05-09 DIAGNOSIS — Z23 Encounter for immunization: Secondary | ICD-10-CM | POA: Diagnosis not present

## 2018-05-10 DIAGNOSIS — N186 End stage renal disease: Secondary | ICD-10-CM | POA: Diagnosis not present

## 2018-05-10 DIAGNOSIS — Z23 Encounter for immunization: Secondary | ICD-10-CM | POA: Diagnosis not present

## 2018-05-10 DIAGNOSIS — N2581 Secondary hyperparathyroidism of renal origin: Secondary | ICD-10-CM | POA: Diagnosis not present

## 2018-05-10 DIAGNOSIS — D631 Anemia in chronic kidney disease: Secondary | ICD-10-CM | POA: Diagnosis not present

## 2018-05-10 DIAGNOSIS — D509 Iron deficiency anemia, unspecified: Secondary | ICD-10-CM | POA: Diagnosis not present

## 2018-05-11 DIAGNOSIS — D631 Anemia in chronic kidney disease: Secondary | ICD-10-CM | POA: Diagnosis not present

## 2018-05-11 DIAGNOSIS — D509 Iron deficiency anemia, unspecified: Secondary | ICD-10-CM | POA: Diagnosis not present

## 2018-05-11 DIAGNOSIS — N186 End stage renal disease: Secondary | ICD-10-CM | POA: Diagnosis not present

## 2018-05-11 DIAGNOSIS — N2581 Secondary hyperparathyroidism of renal origin: Secondary | ICD-10-CM | POA: Diagnosis not present

## 2018-05-11 DIAGNOSIS — Z23 Encounter for immunization: Secondary | ICD-10-CM | POA: Diagnosis not present

## 2018-05-12 DIAGNOSIS — N186 End stage renal disease: Secondary | ICD-10-CM | POA: Diagnosis not present

## 2018-05-12 DIAGNOSIS — N2581 Secondary hyperparathyroidism of renal origin: Secondary | ICD-10-CM | POA: Diagnosis not present

## 2018-05-12 DIAGNOSIS — D509 Iron deficiency anemia, unspecified: Secondary | ICD-10-CM | POA: Diagnosis not present

## 2018-05-12 DIAGNOSIS — Z23 Encounter for immunization: Secondary | ICD-10-CM | POA: Diagnosis not present

## 2018-05-12 DIAGNOSIS — D631 Anemia in chronic kidney disease: Secondary | ICD-10-CM | POA: Diagnosis not present

## 2018-05-13 DIAGNOSIS — N186 End stage renal disease: Secondary | ICD-10-CM | POA: Diagnosis not present

## 2018-05-14 ENCOUNTER — Telehealth: Payer: Self-pay | Admitting: *Deleted

## 2018-05-14 DIAGNOSIS — N186 End stage renal disease: Secondary | ICD-10-CM | POA: Diagnosis not present

## 2018-05-14 NOTE — Telephone Encounter (Signed)
Patient Message Open    05/13/2018 [place], Generic Provider   Conversation: Visit Follow-Up Question  (Newest Message First)  [name]  to April Lenis, MD        05/13/18 8:36 PM  Hey Dr. Harl Bowie,  My blood pressure is still running high even with increasing the of bp to 50mg  twice a day. Do we did to try something else.  Thanks  [name]

## 2018-05-14 NOTE — Telephone Encounter (Signed)
Call placed to patient - stated her BP running 145/73 -  166/79  HR running 52-69.  Please advice if want make adjustments.

## 2018-05-15 DIAGNOSIS — N186 End stage renal disease: Secondary | ICD-10-CM | POA: Diagnosis not present

## 2018-05-15 MED ORDER — HYDRALAZINE HCL 50 MG PO TABS: 75.0000 mg | ORAL_TABLET | Freq: Two times a day (BID) | ORAL | Status: DC

## 2018-05-15 NOTE — Telephone Encounter (Signed)
Can incrase hydralzine to 75mg  bid, update [place] again next week on bp's  Zandra Abts MD

## 2018-05-15 NOTE — Telephone Encounter (Signed)
[place] message sent to patient.

## 2018-05-16 DIAGNOSIS — N186 End stage renal disease: Secondary | ICD-10-CM | POA: Diagnosis not present

## 2018-05-17 DIAGNOSIS — N186 End stage renal disease: Secondary | ICD-10-CM | POA: Diagnosis not present

## 2018-05-18 DIAGNOSIS — N186 End stage renal disease: Secondary | ICD-10-CM | POA: Diagnosis not present

## 2018-05-19 DIAGNOSIS — N186 End stage renal disease: Secondary | ICD-10-CM | POA: Diagnosis not present

## 2018-05-19 DIAGNOSIS — C73 Malignant neoplasm of thyroid gland: Secondary | ICD-10-CM | POA: Diagnosis not present

## 2018-05-20 DIAGNOSIS — N186 End stage renal disease: Secondary | ICD-10-CM | POA: Diagnosis not present

## 2018-05-21 DIAGNOSIS — N186 End stage renal disease: Secondary | ICD-10-CM | POA: Diagnosis not present

## 2018-05-22 DIAGNOSIS — N186 End stage renal disease: Secondary | ICD-10-CM | POA: Diagnosis not present

## 2018-05-23 DIAGNOSIS — N186 End stage renal disease: Secondary | ICD-10-CM | POA: Diagnosis not present

## 2018-05-24 DIAGNOSIS — N186 End stage renal disease: Secondary | ICD-10-CM | POA: Diagnosis not present

## 2018-05-25 DIAGNOSIS — N186 End stage renal disease: Secondary | ICD-10-CM | POA: Diagnosis not present

## 2018-05-26 DIAGNOSIS — N186 End stage renal disease: Secondary | ICD-10-CM | POA: Diagnosis not present

## 2018-05-27 DIAGNOSIS — N186 End stage renal disease: Secondary | ICD-10-CM | POA: Diagnosis not present

## 2018-05-28 DIAGNOSIS — N186 End stage renal disease: Secondary | ICD-10-CM | POA: Diagnosis not present

## 2018-05-29 DIAGNOSIS — N186 End stage renal disease: Secondary | ICD-10-CM | POA: Diagnosis not present

## 2018-05-30 DIAGNOSIS — N186 End stage renal disease: Secondary | ICD-10-CM | POA: Diagnosis not present

## 2018-05-31 DIAGNOSIS — Z992 Dependence on renal dialysis: Secondary | ICD-10-CM | POA: Diagnosis not present

## 2018-05-31 DIAGNOSIS — N186 End stage renal disease: Secondary | ICD-10-CM | POA: Diagnosis not present

## 2018-06-01 DIAGNOSIS — D631 Anemia in chronic kidney disease: Secondary | ICD-10-CM | POA: Diagnosis not present

## 2018-06-01 DIAGNOSIS — N186 End stage renal disease: Secondary | ICD-10-CM | POA: Diagnosis not present

## 2018-06-01 DIAGNOSIS — N2581 Secondary hyperparathyroidism of renal origin: Secondary | ICD-10-CM | POA: Diagnosis not present

## 2018-06-02 DIAGNOSIS — N2581 Secondary hyperparathyroidism of renal origin: Secondary | ICD-10-CM | POA: Diagnosis not present

## 2018-06-02 DIAGNOSIS — N186 End stage renal disease: Secondary | ICD-10-CM | POA: Diagnosis not present

## 2018-06-02 DIAGNOSIS — D631 Anemia in chronic kidney disease: Secondary | ICD-10-CM | POA: Diagnosis not present

## 2018-06-03 DIAGNOSIS — N2581 Secondary hyperparathyroidism of renal origin: Secondary | ICD-10-CM | POA: Diagnosis not present

## 2018-06-03 DIAGNOSIS — D631 Anemia in chronic kidney disease: Secondary | ICD-10-CM | POA: Diagnosis not present

## 2018-06-03 DIAGNOSIS — N186 End stage renal disease: Secondary | ICD-10-CM | POA: Diagnosis not present

## 2018-06-04 DIAGNOSIS — D631 Anemia in chronic kidney disease: Secondary | ICD-10-CM | POA: Diagnosis not present

## 2018-06-04 DIAGNOSIS — N186 End stage renal disease: Secondary | ICD-10-CM | POA: Diagnosis not present

## 2018-06-04 DIAGNOSIS — N2581 Secondary hyperparathyroidism of renal origin: Secondary | ICD-10-CM | POA: Diagnosis not present

## 2018-06-05 DIAGNOSIS — N186 End stage renal disease: Secondary | ICD-10-CM | POA: Diagnosis not present

## 2018-06-05 DIAGNOSIS — N2581 Secondary hyperparathyroidism of renal origin: Secondary | ICD-10-CM | POA: Diagnosis not present

## 2018-06-05 DIAGNOSIS — E039 Hypothyroidism, unspecified: Secondary | ICD-10-CM | POA: Diagnosis not present

## 2018-06-05 DIAGNOSIS — E119 Type 2 diabetes mellitus without complications: Secondary | ICD-10-CM | POA: Diagnosis not present

## 2018-06-05 DIAGNOSIS — D631 Anemia in chronic kidney disease: Secondary | ICD-10-CM | POA: Diagnosis not present

## 2018-06-06 DIAGNOSIS — N186 End stage renal disease: Secondary | ICD-10-CM | POA: Diagnosis not present

## 2018-06-06 DIAGNOSIS — N2581 Secondary hyperparathyroidism of renal origin: Secondary | ICD-10-CM | POA: Diagnosis not present

## 2018-06-06 DIAGNOSIS — D631 Anemia in chronic kidney disease: Secondary | ICD-10-CM | POA: Diagnosis not present

## 2018-06-07 DIAGNOSIS — N2581 Secondary hyperparathyroidism of renal origin: Secondary | ICD-10-CM | POA: Diagnosis not present

## 2018-06-07 DIAGNOSIS — D631 Anemia in chronic kidney disease: Secondary | ICD-10-CM | POA: Diagnosis not present

## 2018-06-07 DIAGNOSIS — N186 End stage renal disease: Secondary | ICD-10-CM | POA: Diagnosis not present

## 2018-06-08 DIAGNOSIS — N2581 Secondary hyperparathyroidism of renal origin: Secondary | ICD-10-CM | POA: Diagnosis not present

## 2018-06-08 DIAGNOSIS — D631 Anemia in chronic kidney disease: Secondary | ICD-10-CM | POA: Diagnosis not present

## 2018-06-08 DIAGNOSIS — N186 End stage renal disease: Secondary | ICD-10-CM | POA: Diagnosis not present

## 2018-06-09 DIAGNOSIS — N2581 Secondary hyperparathyroidism of renal origin: Secondary | ICD-10-CM | POA: Diagnosis not present

## 2018-06-09 DIAGNOSIS — D631 Anemia in chronic kidney disease: Secondary | ICD-10-CM | POA: Diagnosis not present

## 2018-06-09 DIAGNOSIS — N186 End stage renal disease: Secondary | ICD-10-CM | POA: Diagnosis not present

## 2018-06-10 DIAGNOSIS — N186 End stage renal disease: Secondary | ICD-10-CM | POA: Diagnosis not present

## 2018-06-10 DIAGNOSIS — D631 Anemia in chronic kidney disease: Secondary | ICD-10-CM | POA: Diagnosis not present

## 2018-06-10 DIAGNOSIS — N2581 Secondary hyperparathyroidism of renal origin: Secondary | ICD-10-CM | POA: Diagnosis not present

## 2018-06-11 DIAGNOSIS — N186 End stage renal disease: Secondary | ICD-10-CM | POA: Diagnosis not present

## 2018-06-12 DIAGNOSIS — N186 End stage renal disease: Secondary | ICD-10-CM | POA: Diagnosis not present

## 2018-06-13 DIAGNOSIS — N186 End stage renal disease: Secondary | ICD-10-CM | POA: Diagnosis not present

## 2018-06-14 DIAGNOSIS — N186 End stage renal disease: Secondary | ICD-10-CM | POA: Diagnosis not present

## 2018-06-15 DIAGNOSIS — N186 End stage renal disease: Secondary | ICD-10-CM | POA: Diagnosis not present

## 2018-06-16 ENCOUNTER — Other Ambulatory Visit: Payer: Self-pay | Admitting: *Deleted

## 2018-06-16 ENCOUNTER — Telehealth: Payer: Self-pay | Admitting: Cardiology

## 2018-06-16 DIAGNOSIS — N186 End stage renal disease: Secondary | ICD-10-CM | POA: Diagnosis not present

## 2018-06-16 MED ORDER — HYDRALAZINE HCL 50 MG PO TABS: 75.0000 mg | ORAL_TABLET | Freq: Two times a day (BID) | ORAL | 3 refills | Status: DC

## 2018-06-16 NOTE — Telephone Encounter (Signed)
Patient called stating that she was suppose to call in regards to her BP readings.  [phone].

## 2018-06-16 NOTE — Telephone Encounter (Signed)
Patient confirmed that she will send her BP readings via mychart.  Patient also request a refill on her hydralazine be sent to [facility]. Rx refill sent.

## 2018-06-17 DIAGNOSIS — N186 End stage renal disease: Secondary | ICD-10-CM | POA: Diagnosis not present

## 2018-06-18 DIAGNOSIS — N186 End stage renal disease: Secondary | ICD-10-CM | POA: Diagnosis not present

## 2018-06-19 DIAGNOSIS — N186 End stage renal disease: Secondary | ICD-10-CM | POA: Diagnosis not present

## 2018-06-20 DIAGNOSIS — N186 End stage renal disease: Secondary | ICD-10-CM | POA: Diagnosis not present

## 2018-06-21 DIAGNOSIS — N186 End stage renal disease: Secondary | ICD-10-CM | POA: Diagnosis not present

## 2018-06-22 DIAGNOSIS — N186 End stage renal disease: Secondary | ICD-10-CM | POA: Diagnosis not present

## 2018-06-23 ENCOUNTER — Ambulatory Visit (HOSPITAL_COMMUNITY): Payer: Medicare PPO | Admitting: Psychiatry

## 2018-06-23 DIAGNOSIS — N186 End stage renal disease: Secondary | ICD-10-CM | POA: Diagnosis not present

## 2018-06-24 ENCOUNTER — Telehealth: Payer: Self-pay | Admitting: *Deleted

## 2018-06-24 DIAGNOSIS — N186 End stage renal disease: Secondary | ICD-10-CM | POA: Diagnosis not present

## 2018-06-24 MED ORDER — HYDRALAZINE HCL 50 MG PO TABS: 75.0000 mg | ORAL_TABLET | Freq: Three times a day (TID) | ORAL | 3 refills | Status: DC

## 2018-06-24 NOTE — Telephone Encounter (Signed)
Patient informed and verbalized understanding of plan. 

## 2018-06-24 NOTE — Telephone Encounter (Signed)
Thanks for bp results, still running high. We need to change the hydralazine from bid to tid. Increase hydralazine to 75mg  tid, update [place] on bp's late next week   Zandra Abts MD

## 2018-06-24 NOTE — Telephone Encounter (Signed)
Arnoldo Lenis, MD at 06/24/2018 9:20 AM   Status: Signed    Thanks for bp results, still running high. We need to change the hydralazine from bid to tid. Increase hydralazine to 75mg  tid, update [place] on bp's late next week   Zandra Abts MD

## 2018-06-25 ENCOUNTER — Encounter (HOSPITAL_COMMUNITY): Payer: Self-pay | Admitting: Psychiatry

## 2018-06-25 ENCOUNTER — Ambulatory Visit (INDEPENDENT_AMBULATORY_CARE_PROVIDER_SITE_OTHER): Payer: Medicare PPO | Admitting: Psychiatry

## 2018-06-25 ENCOUNTER — Other Ambulatory Visit: Payer: Self-pay

## 2018-06-25 DIAGNOSIS — F322 Major depressive disorder, single episode, severe without psychotic features: Secondary | ICD-10-CM | POA: Diagnosis not present

## 2018-06-25 DIAGNOSIS — F411 Generalized anxiety disorder: Secondary | ICD-10-CM

## 2018-06-25 DIAGNOSIS — Z79899 Other long term (current) drug therapy: Secondary | ICD-10-CM

## 2018-06-25 DIAGNOSIS — N186 End stage renal disease: Secondary | ICD-10-CM | POA: Diagnosis not present

## 2018-06-25 MED ORDER — ALPRAZOLAM 1 MG PO TABS: 1.0000 mg | ORAL_TABLET | Freq: Four times a day (QID) | ORAL | 2 refills | Status: DC

## 2018-06-25 MED ORDER — TEMAZEPAM 30 MG PO CAPS: 30.0000 mg | ORAL_CAPSULE | Freq: Every evening | ORAL | 2 refills | Status: DC | PRN

## 2018-06-25 NOTE — Progress Notes (Signed)
Virtual Visit via Telephone Note  I connected with April Ward on 06/25/18 at  9:20 AM EDT by telephone and verified that I am speaking with the correct person using two identifiers.   I discussed the limitations, risks, security and privacy concerns of performing an evaluation and management service by telephone and the availability of in person appointments. I also discussed with the patient that there may be a patient responsible charge related to this service. The patient expressed understanding and agreed to proceed    I discussed the assessment and treatment plan with the patient. The patient was provided an opportunity to ask questions and all were answered. The patient agreed with the plan and demonstrated an understanding of the instructions.   The patient was advised to call back or seek an in-person evaluation if the symptoms worsen or if the condition fails to improve as anticipated.  I provided  15 minutes of non-face-to-face time during this encounter.   [name], MD  [facility] MD/PA/NP OP Progress Note  06/25/2018 9:57 AM April Ward  MRN:  [id number]  Chief Complaint:  Chief Complaint    Depression; Anxiety; Follow-up     HPI: This patient is a 58 year old widowed black female who lives with her son in [place].  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  The patient is evaluated via telephone visit due to the coronavirus.  She states that she is trying to do her best to not get around people.  She is at high risk due to her dialysis and renal failure.  Her son is actually working from home to help alleviate some of her risk.  She is glad to have someone there with her.  Overall her mood has been stable.  Since it has been warmer out she has been working a lot outdoors in her garden beds and spending time with her dog.  She still talks with her sister on the phone.  She is sleeping fairly well but ran out of Restoril because her mail order  pharmacy did not send it.  We will get this sent today to [facility] along with her Xanax.  This helps a good deal with her anxiety.  She sounds upbeat and positive and denies any suicidal ideation or thoughts of self-harm. Visit Diagnosis:    ICD-10-CM   1. Major depressive disorder, single episode, severe without psychotic features ([place]) F32.2   2. GAD (generalized anxiety disorder) F41.1     Past Psychiatric History: none  Past Medical History:  Past Medical History:  Diagnosis Date  . Anxiety   . Arthritis   . Depression   . Diabetes mellitus without complication ([place])   . GERD (gastroesophageal reflux disease)   . Gout   . Heart murmur   . Hypertension   . Renal cancer ([place])   . Renal disease 10/2012  . Renal insufficiency     Past Surgical History:  Procedure Laterality Date  . CESAREAN SECTION  [id number]  . CHONDROPLASTY Right 08/22/2012   Procedure: CHONDROPLASTY;  Surgeon: Carole Civil, MD;  Location: [facility];  Service: Orthopedics;  Laterality: Right;  . COLONOSCOPY WITH PROPOFOL N/A 03/16/2014   Procedure: COLONOSCOPY WITH PROPOFOL (at cecum 1023, total withdrawal time=9 minutes);  Surgeon: Danie Binder, MD;  Location: [facility];  Service: Endoscopy;  Laterality: N/A;  . GASTRIC BYPASS    . KNEE ARTHROSCOPY WITH MEDIAL MENISECTOMY Right 08/22/2012   Procedure: KNEE ARTHROSCOPY  WITH PARTIAL MEDIAL MENISECTOMY;  Surgeon: Carole Civil, MD;  Location: [facility];  Service: Orthopedics;  Laterality: Right;  . PARTIAL NEPHRECTOMY  Dec 2014   left  . TENDON REPAIR      Family Psychiatric History: See below  Family History:  Family History  Problem Relation Age of Onset  . Heart attack Mother   . Heart attack Father   . Depression Paternal Aunt   . Alcohol abuse Maternal Uncle   . Colon cancer Maternal Aunt   . Colon cancer Maternal Uncle     Social History:  Social History   Socioeconomic History  . Marital status: Married    Spouse name: Not on file  .  Number of children: Not on file  . Years of education: Not on file  . Highest education level: Not on file  Occupational History  . Occupation: Visual merchandiser: [employer]  Social Needs  . Financial resource strain: Not on file  . Food insecurity:    Worry: Not on file    Inability: Not on file  . Transportation needs:    Medical: Not on file    Non-medical: Not on file  Tobacco Use  . Smoking status: Never Smoker  . Smokeless tobacco: Never Used  Substance and Sexual Activity  . Alcohol use: No    Alcohol/week: 0.0 standard drinks  . Drug use: No  . Sexual activity: Not on file  Lifestyle  . Physical activity:    Days per week: Not on file    Minutes per session: Not on file  . Stress: Not on file  Relationships  . Social connections:    Talks on phone: Not on file    Gets together: Not on file    Attends religious service: Not on file    Active member of club or organization: Not on file    Attends meetings of clubs or organizations: Not on file    Relationship status: Not on file  Other Topics Concern  . Not on file  Social History Narrative  . Not on file    Allergies:  Allergies  Allergen Reactions  . Skelaxin [Metaxalone] Hives and Rash    Metabolic Disorder Labs: No results found for: HGBA1C, MPG No results found for: PROLACTIN Lab Results  Component Value Date   CHOL 215 (H) 03/11/2014   TRIG 288 (H) 03/11/2014   HDL 33 (L) 03/11/2014   CHOLHDL 6.5 03/11/2014   VLDL 58 (H) 03/11/2014   LDLCALC 124 (H) 03/11/2014   No results found for: TSH  Therapeutic Level Labs: No results found for: LITHIUM No results found for: VALPROATE No components found for:  CBMZ  Current Medications: Current Outpatient Medications  Medication Sig Dispense Refill  . ALPRAZolam (XANAX) 1 MG tablet Take 1 tablet (1 mg total) by mouth 4 (four) times daily. 120 tablet 2  . amLODipine (NORVASC) 10 MG tablet Take 1 tablet by mouth daily.    Marland Kitchen  buPROPion (WELLBUTRIN XL) 300 MG 24 hr tablet Take 1 tablet (300 mg total) by mouth every morning. 90 tablet 2  . calcitRIOL (ROCALTROL) 0.25 MCG capsule Take 1 capsule by mouth 3 (three) times a week. M-W-F    . hydrALAZINE (APRESOLINE) 50 MG tablet Take 1.5 tablets (75 mg total) by mouth 3 (three) times daily. 405 tablet 3  . lactulose (CONSTULOSE) 10 GM/15ML solution Take 10 mLs by mouth 2 (two) times daily as needed for constipation.    Marland Kitchen  levothyroxine (SYNTHROID, LEVOTHROID) 200 MCG tablet Take 1 tablet by mouth daily.    Marland Kitchen losartan (COZAAR) 50 MG tablet Take 25 mg by mouth daily.    . meclizine (ANTIVERT) 25 MG tablet Take 1 tablet (25 mg total) by mouth every 8 (eight) hours as needed for dizziness. 90 tablet 0  . multivitamin (RENA-VIT) TABS tablet Take 1 tablet by mouth daily.    . prazosin (MINIPRESS) 5 MG capsule Take 1 capsule (5 mg total) by mouth at bedtime. 90 capsule 2  . sevelamer carbonate (RENVELA) 800 MG tablet Take 800 mg by mouth daily.     . temazepam (RESTORIL) 30 MG capsule Take 1 capsule (30 mg total) by mouth at bedtime as needed. for sleep 30 capsule 2  . vitamin B-12 (CYANOCOBALAMIN) 500 MCG tablet Take 500 mcg by mouth daily.     No current facility-administered medications for this visit.      Musculoskeletal: Strength & Muscle Tone: Not able to assess due to phone visit Gait & Station:  Patient leans:   Psychiatric Specialty Exam: Review of Systems  Psychiatric/Behavioral: The patient is nervous/anxious.   All other systems reviewed and are negative.   There were no vitals taken for this visit.There is no height or weight on file to calculate BMI.  General Appearance: NA  Eye Contact:  NA  Speech:  Clear and Coherent  Volume:  Normal  Mood:  Euthymic  Affect:  Appropriate and Congruent  Thought Process:  Goal Directed  Orientation:  Full (Time, Place, and Person)  Thought Content: Rumination   Suicidal Thoughts:  No  Homicidal Thoughts:  No   Memory:  Immediate;   Good Recent;   Good Remote;   Good  Judgement:  Good  Insight:  Fair  Psychomotor Activity:  Decreased  Concentration:  Concentration: Fair and Attention Span: Fair  Recall:  Good  Fund of Knowledge: Good  Language: Good  Akathisia:  No  Handed:  Right  AIMS (if indicated): not done  Assets:  Communication Skills Desire for Improvement Resilience Social Support Talents/Skills  ADL's:  Intact  Cognition: WNL  Sleep:  Good   Screenings:   Assessment and Plan: This patient is a 58 year old female with chronic medical conditions such as end-stage renal disease and also history of depression and anxiety.  She has been particularly more depressed since her husband died 3 years ago.  She is doing well on her current regimen however.  She will continue Xanax 1 mg 4 times daily for anxiety, Wellbutrin XL 300 mg daily for depression, prazosin 5 mg at bedtime for nightmares and Restoril 30 mg at bedtime for sleep.  She will return to see me in 3 months   [name], MD 06/25/2018, 9:57 AM

## 2018-06-26 DIAGNOSIS — N186 End stage renal disease: Secondary | ICD-10-CM | POA: Diagnosis not present

## 2018-06-27 DIAGNOSIS — N186 End stage renal disease: Secondary | ICD-10-CM | POA: Diagnosis not present

## 2018-06-28 DIAGNOSIS — N186 End stage renal disease: Secondary | ICD-10-CM | POA: Diagnosis not present

## 2018-06-29 DIAGNOSIS — N186 End stage renal disease: Secondary | ICD-10-CM | POA: Diagnosis not present

## 2018-06-30 DIAGNOSIS — N186 End stage renal disease: Secondary | ICD-10-CM | POA: Diagnosis not present

## 2018-07-01 DIAGNOSIS — Z992 Dependence on renal dialysis: Secondary | ICD-10-CM | POA: Diagnosis not present

## 2018-07-01 DIAGNOSIS — N186 End stage renal disease: Secondary | ICD-10-CM | POA: Diagnosis not present

## 2018-07-02 DIAGNOSIS — N2581 Secondary hyperparathyroidism of renal origin: Secondary | ICD-10-CM | POA: Diagnosis not present

## 2018-07-02 DIAGNOSIS — N186 End stage renal disease: Secondary | ICD-10-CM | POA: Diagnosis not present

## 2018-07-02 DIAGNOSIS — D509 Iron deficiency anemia, unspecified: Secondary | ICD-10-CM | POA: Diagnosis not present

## 2018-07-02 DIAGNOSIS — D631 Anemia in chronic kidney disease: Secondary | ICD-10-CM | POA: Diagnosis not present

## 2018-07-03 DIAGNOSIS — D509 Iron deficiency anemia, unspecified: Secondary | ICD-10-CM | POA: Diagnosis not present

## 2018-07-03 DIAGNOSIS — N186 End stage renal disease: Secondary | ICD-10-CM | POA: Diagnosis not present

## 2018-07-03 DIAGNOSIS — D631 Anemia in chronic kidney disease: Secondary | ICD-10-CM | POA: Diagnosis not present

## 2018-07-03 DIAGNOSIS — N2581 Secondary hyperparathyroidism of renal origin: Secondary | ICD-10-CM | POA: Diagnosis not present

## 2018-07-04 DIAGNOSIS — D509 Iron deficiency anemia, unspecified: Secondary | ICD-10-CM | POA: Diagnosis not present

## 2018-07-04 DIAGNOSIS — N186 End stage renal disease: Secondary | ICD-10-CM | POA: Diagnosis not present

## 2018-07-04 DIAGNOSIS — N2581 Secondary hyperparathyroidism of renal origin: Secondary | ICD-10-CM | POA: Diagnosis not present

## 2018-07-04 DIAGNOSIS — D631 Anemia in chronic kidney disease: Secondary | ICD-10-CM | POA: Diagnosis not present

## 2018-07-05 DIAGNOSIS — D631 Anemia in chronic kidney disease: Secondary | ICD-10-CM | POA: Diagnosis not present

## 2018-07-05 DIAGNOSIS — D509 Iron deficiency anemia, unspecified: Secondary | ICD-10-CM | POA: Diagnosis not present

## 2018-07-05 DIAGNOSIS — N2581 Secondary hyperparathyroidism of renal origin: Secondary | ICD-10-CM | POA: Diagnosis not present

## 2018-07-05 DIAGNOSIS — N186 End stage renal disease: Secondary | ICD-10-CM | POA: Diagnosis not present

## 2018-07-06 DIAGNOSIS — D631 Anemia in chronic kidney disease: Secondary | ICD-10-CM | POA: Diagnosis not present

## 2018-07-06 DIAGNOSIS — D509 Iron deficiency anemia, unspecified: Secondary | ICD-10-CM | POA: Diagnosis not present

## 2018-07-06 DIAGNOSIS — N2581 Secondary hyperparathyroidism of renal origin: Secondary | ICD-10-CM | POA: Diagnosis not present

## 2018-07-06 DIAGNOSIS — N186 End stage renal disease: Secondary | ICD-10-CM | POA: Diagnosis not present

## 2018-07-07 DIAGNOSIS — D509 Iron deficiency anemia, unspecified: Secondary | ICD-10-CM | POA: Diagnosis not present

## 2018-07-07 DIAGNOSIS — N2581 Secondary hyperparathyroidism of renal origin: Secondary | ICD-10-CM | POA: Diagnosis not present

## 2018-07-07 DIAGNOSIS — N186 End stage renal disease: Secondary | ICD-10-CM | POA: Diagnosis not present

## 2018-07-07 DIAGNOSIS — D631 Anemia in chronic kidney disease: Secondary | ICD-10-CM | POA: Diagnosis not present

## 2018-07-08 DIAGNOSIS — N186 End stage renal disease: Secondary | ICD-10-CM | POA: Diagnosis not present

## 2018-07-08 DIAGNOSIS — D631 Anemia in chronic kidney disease: Secondary | ICD-10-CM | POA: Diagnosis not present

## 2018-07-08 DIAGNOSIS — N2581 Secondary hyperparathyroidism of renal origin: Secondary | ICD-10-CM | POA: Diagnosis not present

## 2018-07-08 DIAGNOSIS — D509 Iron deficiency anemia, unspecified: Secondary | ICD-10-CM | POA: Diagnosis not present

## 2018-07-09 DIAGNOSIS — N186 End stage renal disease: Secondary | ICD-10-CM | POA: Diagnosis not present

## 2018-07-09 DIAGNOSIS — D631 Anemia in chronic kidney disease: Secondary | ICD-10-CM | POA: Diagnosis not present

## 2018-07-09 DIAGNOSIS — N2581 Secondary hyperparathyroidism of renal origin: Secondary | ICD-10-CM | POA: Diagnosis not present

## 2018-07-09 DIAGNOSIS — D509 Iron deficiency anemia, unspecified: Secondary | ICD-10-CM | POA: Diagnosis not present

## 2018-07-09 DIAGNOSIS — E119 Type 2 diabetes mellitus without complications: Secondary | ICD-10-CM | POA: Diagnosis not present

## 2018-07-10 DIAGNOSIS — N186 End stage renal disease: Secondary | ICD-10-CM | POA: Diagnosis not present

## 2018-07-10 DIAGNOSIS — D509 Iron deficiency anemia, unspecified: Secondary | ICD-10-CM | POA: Diagnosis not present

## 2018-07-10 DIAGNOSIS — N2581 Secondary hyperparathyroidism of renal origin: Secondary | ICD-10-CM | POA: Diagnosis not present

## 2018-07-10 DIAGNOSIS — D631 Anemia in chronic kidney disease: Secondary | ICD-10-CM | POA: Diagnosis not present

## 2018-07-11 DIAGNOSIS — N2581 Secondary hyperparathyroidism of renal origin: Secondary | ICD-10-CM | POA: Diagnosis not present

## 2018-07-11 DIAGNOSIS — D509 Iron deficiency anemia, unspecified: Secondary | ICD-10-CM | POA: Diagnosis not present

## 2018-07-11 DIAGNOSIS — D631 Anemia in chronic kidney disease: Secondary | ICD-10-CM | POA: Diagnosis not present

## 2018-07-11 DIAGNOSIS — N186 End stage renal disease: Secondary | ICD-10-CM | POA: Diagnosis not present

## 2018-07-12 DIAGNOSIS — N186 End stage renal disease: Secondary | ICD-10-CM | POA: Diagnosis not present

## 2018-07-13 DIAGNOSIS — N186 End stage renal disease: Secondary | ICD-10-CM | POA: Diagnosis not present

## 2018-07-14 DIAGNOSIS — N186 End stage renal disease: Secondary | ICD-10-CM | POA: Diagnosis not present

## 2018-07-15 DIAGNOSIS — N186 End stage renal disease: Secondary | ICD-10-CM | POA: Diagnosis not present

## 2018-07-16 DIAGNOSIS — N186 End stage renal disease: Secondary | ICD-10-CM | POA: Diagnosis not present

## 2018-07-17 ENCOUNTER — Other Ambulatory Visit: Payer: Self-pay | Admitting: *Deleted

## 2018-07-17 DIAGNOSIS — N186 End stage renal disease: Secondary | ICD-10-CM | POA: Diagnosis not present

## 2018-07-17 MED ORDER — HYDRALAZINE HCL 100 MG PO TABS: 100.0000 mg | ORAL_TABLET | Freq: Three times a day (TID) | ORAL | 1 refills | Status: DC

## 2018-07-18 DIAGNOSIS — N186 End stage renal disease: Secondary | ICD-10-CM | POA: Diagnosis not present

## 2018-07-19 DIAGNOSIS — N186 End stage renal disease: Secondary | ICD-10-CM | POA: Diagnosis not present

## 2018-07-20 DIAGNOSIS — N186 End stage renal disease: Secondary | ICD-10-CM | POA: Diagnosis not present

## 2018-07-21 DIAGNOSIS — N186 End stage renal disease: Secondary | ICD-10-CM | POA: Diagnosis not present

## 2018-07-22 DIAGNOSIS — N186 End stage renal disease: Secondary | ICD-10-CM | POA: Diagnosis not present

## 2018-07-23 DIAGNOSIS — N186 End stage renal disease: Secondary | ICD-10-CM | POA: Diagnosis not present

## 2018-07-24 DIAGNOSIS — N186 End stage renal disease: Secondary | ICD-10-CM | POA: Diagnosis not present

## 2018-07-25 ENCOUNTER — Encounter: Payer: Self-pay | Admitting: Podiatry

## 2018-07-25 ENCOUNTER — Ambulatory Visit: Payer: Medicare PPO | Admitting: Podiatry

## 2018-07-25 ENCOUNTER — Other Ambulatory Visit: Payer: Self-pay

## 2018-07-25 VITALS — Temp 97.5°F

## 2018-07-25 DIAGNOSIS — E1142 Type 2 diabetes mellitus with diabetic polyneuropathy: Secondary | ICD-10-CM

## 2018-07-25 DIAGNOSIS — M2012 Hallux valgus (acquired), left foot: Secondary | ICD-10-CM

## 2018-07-25 DIAGNOSIS — L84 Corns and callosities: Secondary | ICD-10-CM | POA: Diagnosis not present

## 2018-07-25 DIAGNOSIS — M2011 Hallux valgus (acquired), right foot: Secondary | ICD-10-CM

## 2018-07-25 DIAGNOSIS — N186 End stage renal disease: Secondary | ICD-10-CM | POA: Diagnosis not present

## 2018-07-25 NOTE — Progress Notes (Signed)
This patient presents to the office with chief complaint of a painful callus and a blood blister on her big toe right foot.  She says that she enjoys walking and believes she has developed this blister walking in her sneakers.  She says that she has applied medicine to the blister which is healing without any evidence of drainage or infection.  She does have a thick painful callus noted on the bottom of her big toe right foot.  This patient is also a diabetic and we have attempted to order diabetic shoes for this patient.  She presents the office today for an evaluation and treatment of her feet.  Patient is diet controlled diabetic.    Vascular  Dorsalis pedis and posterior tibial pulses are palpable  B/L.  Capillary return  WNL.  Temperature gradient is  WNL.  Skin turgor  WNL  Sensorium  Senn Weinstein monofilament wire  Diminished . Normal tactile sensation.  Nail Exam  Patient has normal nails with no evidence of bacterial or fungal infection.  Orthopedic  Exam  Muscle tone and muscle strength  WNL.  No limitations of motion feet  B/L.  No crepitus or joint effusion noted.  Foot type is unremarkable and digits show no abnormalities.  HAV  B/L.  Hammer toes 2-5  B/L.  Plantarflexed metatarsals 2,3 left foot.   Skin  No open lesions.  Normal skin texture and turgor. Porokeratosis sub 2,3 left foot.  Pinch callus right hallux with healing blister right hallux.  Pinch callus with healing blister  ROV.  Debride pinch callus right hallux.  Debride porokeratosis 2,3 left foot.  Neosporin/DSD at sitr of blister  Neosporin/DSD.  Checked on diabetic shoes for this patient.  Discussed this condition with this patient and recommended that she return every 3 months.  Gardiner Barefoot DPM

## 2018-07-26 DIAGNOSIS — N186 End stage renal disease: Secondary | ICD-10-CM | POA: Diagnosis not present

## 2018-07-27 DIAGNOSIS — N186 End stage renal disease: Secondary | ICD-10-CM | POA: Diagnosis not present

## 2018-07-28 ENCOUNTER — Other Ambulatory Visit: Payer: Self-pay

## 2018-07-28 ENCOUNTER — Ambulatory Visit (INDEPENDENT_AMBULATORY_CARE_PROVIDER_SITE_OTHER): Payer: Medicare PPO | Admitting: Podiatry

## 2018-07-28 DIAGNOSIS — L989 Disorder of the skin and subcutaneous tissue, unspecified: Secondary | ICD-10-CM | POA: Diagnosis not present

## 2018-07-28 DIAGNOSIS — E0843 Diabetes mellitus due to underlying condition with diabetic autonomic (poly)neuropathy: Secondary | ICD-10-CM

## 2018-07-28 DIAGNOSIS — N186 End stage renal disease: Secondary | ICD-10-CM | POA: Diagnosis not present

## 2018-07-28 NOTE — Progress Notes (Signed)
   HPI: 58 year old female h/o T2DM presents the office today for a callus lesion noted to her right great toe.  Patient was last seen on 07/25/2018 at which time debridement of the callus lesions was performed.  She says she has noticed some drainage ever since the debridement which was performed by Dr. Prudence Davidson here in the office.  She presents for further treatment evaluation  Past Medical History:  Diagnosis Date  . Anxiety   . Arthritis   . Depression   . Diabetes mellitus without complication ([place])   . GERD (gastroesophageal reflux disease)   . Gout   . Heart murmur   . Hypertension   . Renal cancer ([place])   . Renal disease 10/2012  . Renal insufficiency      Physical Exam: General: The patient is alert and oriented x3 in no acute distress.  Dermatology: Skin is warm, dry and supple bilateral lower extremities. Negative for open lesions or macerations.  Hyperkeratotic callus lesion noted to the right great toe with dried sanguinous residue.  No active bleeding at the moment.  No open wound noted.  Vascular: Palpable pedal pulses bilaterally. No edema or erythema noted. Capillary refill within normal limits.  Neurological: Epicritic and protective threshold diminished bilaterally.   Musculoskeletal Exam: Range of motion within normal limits to all pedal and ankle joints bilateral. Muscle strength 5/5 in all groups bilateral.   Radiographic Exam:  Normal osseous mineralization. Joint spaces preserved. No fracture/dislocation/boney destruction.    Assessment: 1.  Pre-ulcerative callus lesion noted right great toe   Plan of Care:  1. Patient evaluated. 2.  Excisional debridement of the callus lesion was performed using a chisel blade and tissue nipper.  No bleeding noted.  Antibiotic cream applied to the area. 3.  Recommend good supportive shoe gear with antibiotic cream for another week and keep the feet moisturized 4.  Return to clinic on neck scheduled appointment for routine  care with Dr. Alcide Evener, DPM [facility]  Dr. Edrick Kins, DPM    [address], [place] [address]                Office [phone]  Fax [phone]

## 2018-07-29 DIAGNOSIS — N186 End stage renal disease: Secondary | ICD-10-CM | POA: Diagnosis not present

## 2018-07-30 DIAGNOSIS — N186 End stage renal disease: Secondary | ICD-10-CM | POA: Diagnosis not present

## 2018-07-30 NOTE — Telephone Encounter (Signed)
BP's improving but still a little elevated, we are limited on medication options due to her kidney function and low normal heart rates. Can we get most recent labs from pcp. Continue current regimen for now, she is trending down.   Zandra Abts MD

## 2018-07-31 DIAGNOSIS — N186 End stage renal disease: Secondary | ICD-10-CM | POA: Diagnosis not present

## 2018-07-31 DIAGNOSIS — Z992 Dependence on renal dialysis: Secondary | ICD-10-CM | POA: Diagnosis not present

## 2018-08-01 DIAGNOSIS — D631 Anemia in chronic kidney disease: Secondary | ICD-10-CM | POA: Diagnosis not present

## 2018-08-01 DIAGNOSIS — N2581 Secondary hyperparathyroidism of renal origin: Secondary | ICD-10-CM | POA: Diagnosis not present

## 2018-08-01 DIAGNOSIS — D509 Iron deficiency anemia, unspecified: Secondary | ICD-10-CM | POA: Diagnosis not present

## 2018-08-01 DIAGNOSIS — N186 End stage renal disease: Secondary | ICD-10-CM | POA: Diagnosis not present

## 2018-08-02 DIAGNOSIS — N186 End stage renal disease: Secondary | ICD-10-CM | POA: Diagnosis not present

## 2018-08-02 DIAGNOSIS — D631 Anemia in chronic kidney disease: Secondary | ICD-10-CM | POA: Diagnosis not present

## 2018-08-02 DIAGNOSIS — N2581 Secondary hyperparathyroidism of renal origin: Secondary | ICD-10-CM | POA: Diagnosis not present

## 2018-08-02 DIAGNOSIS — D509 Iron deficiency anemia, unspecified: Secondary | ICD-10-CM | POA: Diagnosis not present

## 2018-08-03 DIAGNOSIS — N2581 Secondary hyperparathyroidism of renal origin: Secondary | ICD-10-CM | POA: Diagnosis not present

## 2018-08-03 DIAGNOSIS — N186 End stage renal disease: Secondary | ICD-10-CM | POA: Diagnosis not present

## 2018-08-03 DIAGNOSIS — D631 Anemia in chronic kidney disease: Secondary | ICD-10-CM | POA: Diagnosis not present

## 2018-08-03 DIAGNOSIS — D509 Iron deficiency anemia, unspecified: Secondary | ICD-10-CM | POA: Diagnosis not present

## 2018-08-04 DIAGNOSIS — D509 Iron deficiency anemia, unspecified: Secondary | ICD-10-CM | POA: Diagnosis not present

## 2018-08-04 DIAGNOSIS — N2581 Secondary hyperparathyroidism of renal origin: Secondary | ICD-10-CM | POA: Diagnosis not present

## 2018-08-04 DIAGNOSIS — N186 End stage renal disease: Secondary | ICD-10-CM | POA: Diagnosis not present

## 2018-08-04 DIAGNOSIS — D631 Anemia in chronic kidney disease: Secondary | ICD-10-CM | POA: Diagnosis not present

## 2018-08-05 DIAGNOSIS — D509 Iron deficiency anemia, unspecified: Secondary | ICD-10-CM | POA: Diagnosis not present

## 2018-08-05 DIAGNOSIS — N2581 Secondary hyperparathyroidism of renal origin: Secondary | ICD-10-CM | POA: Diagnosis not present

## 2018-08-05 DIAGNOSIS — N186 End stage renal disease: Secondary | ICD-10-CM | POA: Diagnosis not present

## 2018-08-05 DIAGNOSIS — D631 Anemia in chronic kidney disease: Secondary | ICD-10-CM | POA: Diagnosis not present

## 2018-08-06 DIAGNOSIS — N186 End stage renal disease: Secondary | ICD-10-CM | POA: Diagnosis not present

## 2018-08-06 DIAGNOSIS — N2581 Secondary hyperparathyroidism of renal origin: Secondary | ICD-10-CM | POA: Diagnosis not present

## 2018-08-06 DIAGNOSIS — D631 Anemia in chronic kidney disease: Secondary | ICD-10-CM | POA: Diagnosis not present

## 2018-08-06 DIAGNOSIS — D509 Iron deficiency anemia, unspecified: Secondary | ICD-10-CM | POA: Diagnosis not present

## 2018-08-06 DIAGNOSIS — E119 Type 2 diabetes mellitus without complications: Secondary | ICD-10-CM | POA: Diagnosis not present

## 2018-08-06 DIAGNOSIS — Z4932 Encounter for adequacy testing for peritoneal dialysis: Secondary | ICD-10-CM | POA: Diagnosis not present

## 2018-08-07 DIAGNOSIS — D509 Iron deficiency anemia, unspecified: Secondary | ICD-10-CM | POA: Diagnosis not present

## 2018-08-07 DIAGNOSIS — N186 End stage renal disease: Secondary | ICD-10-CM | POA: Diagnosis not present

## 2018-08-07 DIAGNOSIS — N2581 Secondary hyperparathyroidism of renal origin: Secondary | ICD-10-CM | POA: Diagnosis not present

## 2018-08-07 DIAGNOSIS — D631 Anemia in chronic kidney disease: Secondary | ICD-10-CM | POA: Diagnosis not present

## 2018-08-08 DIAGNOSIS — C73 Malignant neoplasm of thyroid gland: Secondary | ICD-10-CM | POA: Diagnosis not present

## 2018-08-08 DIAGNOSIS — D631 Anemia in chronic kidney disease: Secondary | ICD-10-CM | POA: Diagnosis not present

## 2018-08-08 DIAGNOSIS — D509 Iron deficiency anemia, unspecified: Secondary | ICD-10-CM | POA: Diagnosis not present

## 2018-08-08 DIAGNOSIS — E890 Postprocedural hypothyroidism: Secondary | ICD-10-CM | POA: Insufficient documentation

## 2018-08-08 DIAGNOSIS — N186 End stage renal disease: Secondary | ICD-10-CM | POA: Diagnosis not present

## 2018-08-08 DIAGNOSIS — N2581 Secondary hyperparathyroidism of renal origin: Secondary | ICD-10-CM | POA: Diagnosis not present

## 2018-08-09 DIAGNOSIS — N186 End stage renal disease: Secondary | ICD-10-CM | POA: Diagnosis not present

## 2018-08-09 DIAGNOSIS — N2581 Secondary hyperparathyroidism of renal origin: Secondary | ICD-10-CM | POA: Diagnosis not present

## 2018-08-09 DIAGNOSIS — D509 Iron deficiency anemia, unspecified: Secondary | ICD-10-CM | POA: Diagnosis not present

## 2018-08-09 DIAGNOSIS — D631 Anemia in chronic kidney disease: Secondary | ICD-10-CM | POA: Diagnosis not present

## 2018-08-10 DIAGNOSIS — N186 End stage renal disease: Secondary | ICD-10-CM | POA: Diagnosis not present

## 2018-08-10 DIAGNOSIS — N2581 Secondary hyperparathyroidism of renal origin: Secondary | ICD-10-CM | POA: Diagnosis not present

## 2018-08-10 DIAGNOSIS — D631 Anemia in chronic kidney disease: Secondary | ICD-10-CM | POA: Diagnosis not present

## 2018-08-10 DIAGNOSIS — D509 Iron deficiency anemia, unspecified: Secondary | ICD-10-CM | POA: Diagnosis not present

## 2018-08-11 DIAGNOSIS — N186 End stage renal disease: Secondary | ICD-10-CM | POA: Diagnosis not present

## 2018-08-12 DIAGNOSIS — N186 End stage renal disease: Secondary | ICD-10-CM | POA: Diagnosis not present

## 2018-08-13 ENCOUNTER — Other Ambulatory Visit: Payer: Self-pay

## 2018-08-13 ENCOUNTER — Ambulatory Visit (INDEPENDENT_AMBULATORY_CARE_PROVIDER_SITE_OTHER): Payer: Self-pay | Admitting: Orthotics

## 2018-08-13 DIAGNOSIS — E0843 Diabetes mellitus due to underlying condition with diabetic autonomic (poly)neuropathy: Secondary | ICD-10-CM

## 2018-08-13 DIAGNOSIS — M2012 Hallux valgus (acquired), left foot: Secondary | ICD-10-CM

## 2018-08-13 DIAGNOSIS — N186 End stage renal disease: Secondary | ICD-10-CM | POA: Diagnosis not present

## 2018-08-13 DIAGNOSIS — L84 Corns and callosities: Secondary | ICD-10-CM

## 2018-08-13 DIAGNOSIS — M2040 Other hammer toe(s) (acquired), unspecified foot: Secondary | ICD-10-CM

## 2018-08-13 DIAGNOSIS — M2041 Other hammer toe(s) (acquired), right foot: Secondary | ICD-10-CM | POA: Diagnosis not present

## 2018-08-13 DIAGNOSIS — M2011 Hallux valgus (acquired), right foot: Secondary | ICD-10-CM

## 2018-08-13 DIAGNOSIS — M2042 Other hammer toe(s) (acquired), left foot: Secondary | ICD-10-CM

## 2018-08-13 DIAGNOSIS — E1142 Type 2 diabetes mellitus with diabetic polyneuropathy: Secondary | ICD-10-CM

## 2018-08-13 NOTE — Progress Notes (Signed)
Patient presents for diabetic shoe pick up, shoes are tried on for good fit.  Patient received 1 Pair and 3 pairs custom molded diabetic inserts.  Verbal and written break in and wear instructions given.  Patient will follow up for scheduled routine care.   

## 2018-08-14 DIAGNOSIS — N186 End stage renal disease: Secondary | ICD-10-CM | POA: Diagnosis not present

## 2018-08-15 DIAGNOSIS — R590 Localized enlarged lymph nodes: Secondary | ICD-10-CM | POA: Diagnosis not present

## 2018-08-15 DIAGNOSIS — E890 Postprocedural hypothyroidism: Secondary | ICD-10-CM | POA: Diagnosis not present

## 2018-08-15 DIAGNOSIS — C73 Malignant neoplasm of thyroid gland: Secondary | ICD-10-CM | POA: Diagnosis not present

## 2018-08-15 DIAGNOSIS — N186 End stage renal disease: Secondary | ICD-10-CM | POA: Diagnosis not present

## 2018-08-16 DIAGNOSIS — N186 End stage renal disease: Secondary | ICD-10-CM | POA: Diagnosis not present

## 2018-08-17 DIAGNOSIS — N186 End stage renal disease: Secondary | ICD-10-CM | POA: Diagnosis not present

## 2018-08-18 DIAGNOSIS — N186 End stage renal disease: Secondary | ICD-10-CM | POA: Diagnosis not present

## 2018-08-19 DIAGNOSIS — N186 End stage renal disease: Secondary | ICD-10-CM | POA: Diagnosis not present

## 2018-08-20 DIAGNOSIS — N186 End stage renal disease: Secondary | ICD-10-CM | POA: Diagnosis not present

## 2018-08-21 DIAGNOSIS — N186 End stage renal disease: Secondary | ICD-10-CM | POA: Diagnosis not present

## 2018-08-22 DIAGNOSIS — N186 End stage renal disease: Secondary | ICD-10-CM | POA: Diagnosis not present

## 2018-08-23 DIAGNOSIS — N186 End stage renal disease: Secondary | ICD-10-CM | POA: Diagnosis not present

## 2018-08-24 DIAGNOSIS — N186 End stage renal disease: Secondary | ICD-10-CM | POA: Diagnosis not present

## 2018-08-25 DIAGNOSIS — N186 End stage renal disease: Secondary | ICD-10-CM | POA: Diagnosis not present

## 2018-08-26 DIAGNOSIS — N186 End stage renal disease: Secondary | ICD-10-CM | POA: Diagnosis not present

## 2018-08-27 DIAGNOSIS — N186 End stage renal disease: Secondary | ICD-10-CM | POA: Diagnosis not present

## 2018-08-28 DIAGNOSIS — N186 End stage renal disease: Secondary | ICD-10-CM | POA: Diagnosis not present

## 2018-08-28 NOTE — Telephone Encounter (Signed)
BP's look pretty good, no changes at this time   Zandra Abts MD

## 2018-08-29 DIAGNOSIS — N186 End stage renal disease: Secondary | ICD-10-CM | POA: Diagnosis not present

## 2018-08-30 DIAGNOSIS — N186 End stage renal disease: Secondary | ICD-10-CM | POA: Diagnosis not present

## 2018-08-31 DIAGNOSIS — N186 End stage renal disease: Secondary | ICD-10-CM | POA: Diagnosis not present

## 2018-08-31 DIAGNOSIS — Z992 Dependence on renal dialysis: Secondary | ICD-10-CM | POA: Diagnosis not present

## 2018-09-01 ENCOUNTER — Ambulatory Visit (INDEPENDENT_AMBULATORY_CARE_PROVIDER_SITE_OTHER): Payer: Medicare PPO

## 2018-09-01 ENCOUNTER — Ambulatory Visit: Payer: Medicare PPO | Admitting: Podiatry

## 2018-09-01 ENCOUNTER — Other Ambulatory Visit: Payer: Self-pay

## 2018-09-01 ENCOUNTER — Encounter: Payer: Self-pay | Admitting: Podiatry

## 2018-09-01 VITALS — Temp 97.5°F

## 2018-09-01 DIAGNOSIS — D631 Anemia in chronic kidney disease: Secondary | ICD-10-CM | POA: Diagnosis not present

## 2018-09-01 DIAGNOSIS — N186 End stage renal disease: Secondary | ICD-10-CM | POA: Diagnosis not present

## 2018-09-01 DIAGNOSIS — N2581 Secondary hyperparathyroidism of renal origin: Secondary | ICD-10-CM | POA: Diagnosis not present

## 2018-09-01 DIAGNOSIS — M779 Enthesopathy, unspecified: Secondary | ICD-10-CM | POA: Diagnosis not present

## 2018-09-01 DIAGNOSIS — S9002XA Contusion of left ankle, initial encounter: Secondary | ICD-10-CM | POA: Diagnosis not present

## 2018-09-01 DIAGNOSIS — Z23 Encounter for immunization: Secondary | ICD-10-CM | POA: Diagnosis not present

## 2018-09-01 DIAGNOSIS — D509 Iron deficiency anemia, unspecified: Secondary | ICD-10-CM | POA: Diagnosis not present

## 2018-09-01 MED ORDER — TRIAMCINOLONE ACETONIDE 10 MG/ML IJ SUSP
10.0000 mg | Freq: Once | INTRAMUSCULAR | Status: AC
  Administered 2018-09-01: 10 mg

## 2018-09-01 NOTE — Progress Notes (Signed)
Subjective:   Patient ID: April Ward, female   DOB: 58 y.o.   MRN: [id number]   HPI Patient presents stating my left foot has turned in my ankle and it is been sore in the ankle and makes it hard for me to walk.  Patient likes to be active   ROS      Objective:  Physical Exam  Neurovascular status intact with patient's left-sided scars found to be quite inflamed and painful with moderate swelling to the lateral side of the left ankle with mild ligamentous instability     Assessment:  Inflammatory capsulitis sinus tarsi left with probable ankle sprain with moderate instability     Plan:  HEP condition reviewed and did sterile prep today and injected the sinus tarsi 3 mg Kenalog 5 mg Xylocaine and applied Tri-Lock ankle brace to hold the position and prevent excessive inversion eversion.  Reappoint for [place] to recheck  X-rays indicate there is no signs of fracture or diastases injury left

## 2018-09-02 DIAGNOSIS — N2581 Secondary hyperparathyroidism of renal origin: Secondary | ICD-10-CM | POA: Diagnosis not present

## 2018-09-02 DIAGNOSIS — Z23 Encounter for immunization: Secondary | ICD-10-CM | POA: Diagnosis not present

## 2018-09-02 DIAGNOSIS — D509 Iron deficiency anemia, unspecified: Secondary | ICD-10-CM | POA: Diagnosis not present

## 2018-09-02 DIAGNOSIS — D631 Anemia in chronic kidney disease: Secondary | ICD-10-CM | POA: Diagnosis not present

## 2018-09-02 DIAGNOSIS — N186 End stage renal disease: Secondary | ICD-10-CM | POA: Diagnosis not present

## 2018-09-03 ENCOUNTER — Other Ambulatory Visit (HOSPITAL_COMMUNITY): Payer: Self-pay | Admitting: Obstetrics and Gynecology

## 2018-09-03 DIAGNOSIS — Z949 Transplanted organ and tissue status, unspecified: Secondary | ICD-10-CM | POA: Diagnosis not present

## 2018-09-03 DIAGNOSIS — Z23 Encounter for immunization: Secondary | ICD-10-CM | POA: Diagnosis not present

## 2018-09-03 DIAGNOSIS — E1122 Type 2 diabetes mellitus with diabetic chronic kidney disease: Secondary | ICD-10-CM | POA: Diagnosis not present

## 2018-09-03 DIAGNOSIS — N186 End stage renal disease: Secondary | ICD-10-CM | POA: Diagnosis not present

## 2018-09-03 DIAGNOSIS — D631 Anemia in chronic kidney disease: Secondary | ICD-10-CM | POA: Diagnosis not present

## 2018-09-03 DIAGNOSIS — D509 Iron deficiency anemia, unspecified: Secondary | ICD-10-CM | POA: Diagnosis not present

## 2018-09-03 DIAGNOSIS — Z1231 Encounter for screening mammogram for malignant neoplasm of breast: Secondary | ICD-10-CM

## 2018-09-03 DIAGNOSIS — E119 Type 2 diabetes mellitus without complications: Secondary | ICD-10-CM | POA: Diagnosis not present

## 2018-09-03 DIAGNOSIS — I120 Hypertensive chronic kidney disease with stage 5 chronic kidney disease or end stage renal disease: Secondary | ICD-10-CM | POA: Diagnosis not present

## 2018-09-03 DIAGNOSIS — Z01818 Encounter for other preprocedural examination: Secondary | ICD-10-CM | POA: Diagnosis not present

## 2018-09-03 DIAGNOSIS — N2581 Secondary hyperparathyroidism of renal origin: Secondary | ICD-10-CM | POA: Diagnosis not present

## 2018-09-03 DIAGNOSIS — Z992 Dependence on renal dialysis: Secondary | ICD-10-CM | POA: Diagnosis not present

## 2018-09-04 DIAGNOSIS — N186 End stage renal disease: Secondary | ICD-10-CM | POA: Diagnosis not present

## 2018-09-04 DIAGNOSIS — D509 Iron deficiency anemia, unspecified: Secondary | ICD-10-CM | POA: Diagnosis not present

## 2018-09-04 DIAGNOSIS — N2581 Secondary hyperparathyroidism of renal origin: Secondary | ICD-10-CM | POA: Diagnosis not present

## 2018-09-04 DIAGNOSIS — D631 Anemia in chronic kidney disease: Secondary | ICD-10-CM | POA: Diagnosis not present

## 2018-09-04 DIAGNOSIS — Z23 Encounter for immunization: Secondary | ICD-10-CM | POA: Diagnosis not present

## 2018-09-05 DIAGNOSIS — D509 Iron deficiency anemia, unspecified: Secondary | ICD-10-CM | POA: Diagnosis not present

## 2018-09-05 DIAGNOSIS — N2581 Secondary hyperparathyroidism of renal origin: Secondary | ICD-10-CM | POA: Diagnosis not present

## 2018-09-05 DIAGNOSIS — Z23 Encounter for immunization: Secondary | ICD-10-CM | POA: Diagnosis not present

## 2018-09-05 DIAGNOSIS — N186 End stage renal disease: Secondary | ICD-10-CM | POA: Diagnosis not present

## 2018-09-05 DIAGNOSIS — D631 Anemia in chronic kidney disease: Secondary | ICD-10-CM | POA: Diagnosis not present

## 2018-09-06 DIAGNOSIS — D509 Iron deficiency anemia, unspecified: Secondary | ICD-10-CM | POA: Diagnosis not present

## 2018-09-06 DIAGNOSIS — Z23 Encounter for immunization: Secondary | ICD-10-CM | POA: Diagnosis not present

## 2018-09-06 DIAGNOSIS — D631 Anemia in chronic kidney disease: Secondary | ICD-10-CM | POA: Diagnosis not present

## 2018-09-06 DIAGNOSIS — N186 End stage renal disease: Secondary | ICD-10-CM | POA: Diagnosis not present

## 2018-09-06 DIAGNOSIS — N2581 Secondary hyperparathyroidism of renal origin: Secondary | ICD-10-CM | POA: Diagnosis not present

## 2018-09-07 DIAGNOSIS — Z23 Encounter for immunization: Secondary | ICD-10-CM | POA: Diagnosis not present

## 2018-09-07 DIAGNOSIS — D509 Iron deficiency anemia, unspecified: Secondary | ICD-10-CM | POA: Diagnosis not present

## 2018-09-07 DIAGNOSIS — N186 End stage renal disease: Secondary | ICD-10-CM | POA: Diagnosis not present

## 2018-09-07 DIAGNOSIS — D631 Anemia in chronic kidney disease: Secondary | ICD-10-CM | POA: Diagnosis not present

## 2018-09-07 DIAGNOSIS — N2581 Secondary hyperparathyroidism of renal origin: Secondary | ICD-10-CM | POA: Diagnosis not present

## 2018-09-08 DIAGNOSIS — D631 Anemia in chronic kidney disease: Secondary | ICD-10-CM | POA: Diagnosis not present

## 2018-09-08 DIAGNOSIS — N2581 Secondary hyperparathyroidism of renal origin: Secondary | ICD-10-CM | POA: Diagnosis not present

## 2018-09-08 DIAGNOSIS — Z23 Encounter for immunization: Secondary | ICD-10-CM | POA: Diagnosis not present

## 2018-09-08 DIAGNOSIS — D509 Iron deficiency anemia, unspecified: Secondary | ICD-10-CM | POA: Diagnosis not present

## 2018-09-08 DIAGNOSIS — N186 End stage renal disease: Secondary | ICD-10-CM | POA: Diagnosis not present

## 2018-09-09 DIAGNOSIS — Z23 Encounter for immunization: Secondary | ICD-10-CM | POA: Diagnosis not present

## 2018-09-09 DIAGNOSIS — D509 Iron deficiency anemia, unspecified: Secondary | ICD-10-CM | POA: Diagnosis not present

## 2018-09-09 DIAGNOSIS — N2581 Secondary hyperparathyroidism of renal origin: Secondary | ICD-10-CM | POA: Diagnosis not present

## 2018-09-09 DIAGNOSIS — D631 Anemia in chronic kidney disease: Secondary | ICD-10-CM | POA: Diagnosis not present

## 2018-09-09 DIAGNOSIS — N186 End stage renal disease: Secondary | ICD-10-CM | POA: Diagnosis not present

## 2018-09-10 DIAGNOSIS — N186 End stage renal disease: Secondary | ICD-10-CM | POA: Diagnosis not present

## 2018-09-10 DIAGNOSIS — D631 Anemia in chronic kidney disease: Secondary | ICD-10-CM | POA: Diagnosis not present

## 2018-09-10 DIAGNOSIS — D509 Iron deficiency anemia, unspecified: Secondary | ICD-10-CM | POA: Diagnosis not present

## 2018-09-10 DIAGNOSIS — Z23 Encounter for immunization: Secondary | ICD-10-CM | POA: Diagnosis not present

## 2018-09-10 DIAGNOSIS — N2581 Secondary hyperparathyroidism of renal origin: Secondary | ICD-10-CM | POA: Diagnosis not present

## 2018-09-11 DIAGNOSIS — N186 End stage renal disease: Secondary | ICD-10-CM | POA: Diagnosis not present

## 2018-09-11 DIAGNOSIS — N2581 Secondary hyperparathyroidism of renal origin: Secondary | ICD-10-CM | POA: Diagnosis not present

## 2018-09-12 DIAGNOSIS — N2581 Secondary hyperparathyroidism of renal origin: Secondary | ICD-10-CM | POA: Diagnosis not present

## 2018-09-12 DIAGNOSIS — N186 End stage renal disease: Secondary | ICD-10-CM | POA: Diagnosis not present

## 2018-09-13 DIAGNOSIS — N186 End stage renal disease: Secondary | ICD-10-CM | POA: Diagnosis not present

## 2018-09-13 DIAGNOSIS — N2581 Secondary hyperparathyroidism of renal origin: Secondary | ICD-10-CM | POA: Diagnosis not present

## 2018-09-14 DIAGNOSIS — N186 End stage renal disease: Secondary | ICD-10-CM | POA: Diagnosis not present

## 2018-09-14 DIAGNOSIS — N2581 Secondary hyperparathyroidism of renal origin: Secondary | ICD-10-CM | POA: Diagnosis not present

## 2018-09-15 ENCOUNTER — Ambulatory Visit (HOSPITAL_COMMUNITY)
Admission: RE | Admit: 2018-09-15 | Discharge: 2018-09-15 | Disposition: A | Discharge status: Home / Self Care | Payer: Medicare PPO | Source: Ambulatory Visit | Attending: Obstetrics and Gynecology | Admitting: Obstetrics and Gynecology

## 2018-09-15 ENCOUNTER — Other Ambulatory Visit: Payer: Self-pay

## 2018-09-15 DIAGNOSIS — Z1231 Encounter for screening mammogram for malignant neoplasm of breast: Secondary | ICD-10-CM | POA: Diagnosis not present

## 2018-09-15 DIAGNOSIS — N186 End stage renal disease: Secondary | ICD-10-CM | POA: Diagnosis not present

## 2018-09-15 DIAGNOSIS — N2581 Secondary hyperparathyroidism of renal origin: Secondary | ICD-10-CM | POA: Diagnosis not present

## 2018-09-16 DIAGNOSIS — N2581 Secondary hyperparathyroidism of renal origin: Secondary | ICD-10-CM | POA: Diagnosis not present

## 2018-09-16 DIAGNOSIS — R591 Generalized enlarged lymph nodes: Secondary | ICD-10-CM | POA: Diagnosis not present

## 2018-09-16 DIAGNOSIS — I6523 Occlusion and stenosis of bilateral carotid arteries: Secondary | ICD-10-CM | POA: Diagnosis not present

## 2018-09-16 DIAGNOSIS — Z01810 Encounter for preprocedural cardiovascular examination: Secondary | ICD-10-CM | POA: Diagnosis not present

## 2018-09-16 DIAGNOSIS — Z01818 Encounter for other preprocedural examination: Secondary | ICD-10-CM | POA: Diagnosis not present

## 2018-09-16 DIAGNOSIS — N186 End stage renal disease: Secondary | ICD-10-CM | POA: Diagnosis not present

## 2018-09-17 DIAGNOSIS — N2581 Secondary hyperparathyroidism of renal origin: Secondary | ICD-10-CM | POA: Diagnosis not present

## 2018-09-17 DIAGNOSIS — N186 End stage renal disease: Secondary | ICD-10-CM | POA: Diagnosis not present

## 2018-09-18 ENCOUNTER — Telehealth (HOSPITAL_COMMUNITY): Payer: Self-pay | Admitting: Psychiatry

## 2018-09-18 DIAGNOSIS — N186 End stage renal disease: Secondary | ICD-10-CM | POA: Diagnosis not present

## 2018-09-18 DIAGNOSIS — N2581 Secondary hyperparathyroidism of renal origin: Secondary | ICD-10-CM | POA: Diagnosis not present

## 2018-09-18 NOTE — Telephone Encounter (Signed)
Received a call from [place] from [facility]plant office about this patient having a kidney transplant.    A letter is needed saying how long she has been coming to [place], and if she is stable enough to go through the transplant procedure.      [place] fax is [phone], and phone is [phone].   Thanks

## 2018-09-19 ENCOUNTER — Encounter (HOSPITAL_COMMUNITY): Payer: Self-pay | Admitting: Psychiatry

## 2018-09-19 DIAGNOSIS — C73 Malignant neoplasm of thyroid gland: Secondary | ICD-10-CM | POA: Diagnosis not present

## 2018-09-19 DIAGNOSIS — N186 End stage renal disease: Secondary | ICD-10-CM | POA: Diagnosis not present

## 2018-09-19 DIAGNOSIS — R591 Generalized enlarged lymph nodes: Secondary | ICD-10-CM | POA: Diagnosis not present

## 2018-09-19 DIAGNOSIS — N2581 Secondary hyperparathyroidism of renal origin: Secondary | ICD-10-CM | POA: Diagnosis not present

## 2018-09-19 NOTE — Telephone Encounter (Signed)
Letter completed.

## 2018-09-20 DIAGNOSIS — N2581 Secondary hyperparathyroidism of renal origin: Secondary | ICD-10-CM | POA: Diagnosis not present

## 2018-09-20 DIAGNOSIS — N186 End stage renal disease: Secondary | ICD-10-CM | POA: Diagnosis not present

## 2018-09-21 DIAGNOSIS — N186 End stage renal disease: Secondary | ICD-10-CM | POA: Diagnosis not present

## 2018-09-22 DIAGNOSIS — N186 End stage renal disease: Secondary | ICD-10-CM | POA: Diagnosis not present

## 2018-09-23 DIAGNOSIS — M793 Panniculitis, unspecified: Secondary | ICD-10-CM | POA: Diagnosis not present

## 2018-09-23 DIAGNOSIS — N186 End stage renal disease: Secondary | ICD-10-CM | POA: Diagnosis not present

## 2018-09-24 DIAGNOSIS — N186 End stage renal disease: Secondary | ICD-10-CM | POA: Diagnosis not present

## 2018-09-25 ENCOUNTER — Other Ambulatory Visit: Payer: Self-pay | Admitting: *Deleted

## 2018-09-25 DIAGNOSIS — N186 End stage renal disease: Secondary | ICD-10-CM | POA: Diagnosis not present

## 2018-09-25 MED ORDER — LOSARTAN POTASSIUM 50 MG PO TABS: 25.0000 mg | ORAL_TABLET | Freq: Every day | ORAL | 0 refills | Status: DC

## 2018-09-26 DIAGNOSIS — N186 End stage renal disease: Secondary | ICD-10-CM | POA: Diagnosis not present

## 2018-09-27 DIAGNOSIS — N186 End stage renal disease: Secondary | ICD-10-CM | POA: Diagnosis not present

## 2018-09-28 DIAGNOSIS — N186 End stage renal disease: Secondary | ICD-10-CM | POA: Diagnosis not present

## 2018-09-29 DIAGNOSIS — N186 End stage renal disease: Secondary | ICD-10-CM | POA: Diagnosis not present

## 2018-09-30 DIAGNOSIS — N186 End stage renal disease: Secondary | ICD-10-CM | POA: Diagnosis not present

## 2018-09-30 DIAGNOSIS — Z992 Dependence on renal dialysis: Secondary | ICD-10-CM | POA: Diagnosis not present

## 2018-10-01 ENCOUNTER — Other Ambulatory Visit: Payer: Self-pay

## 2018-10-01 ENCOUNTER — Encounter (HOSPITAL_COMMUNITY): Payer: Self-pay | Admitting: Psychiatry

## 2018-10-01 ENCOUNTER — Ambulatory Visit (INDEPENDENT_AMBULATORY_CARE_PROVIDER_SITE_OTHER): Payer: Medicare PPO | Admitting: Psychiatry

## 2018-10-01 DIAGNOSIS — D509 Iron deficiency anemia, unspecified: Secondary | ICD-10-CM | POA: Diagnosis not present

## 2018-10-01 DIAGNOSIS — F411 Generalized anxiety disorder: Secondary | ICD-10-CM

## 2018-10-01 DIAGNOSIS — F322 Major depressive disorder, single episode, severe without psychotic features: Secondary | ICD-10-CM

## 2018-10-01 DIAGNOSIS — D631 Anemia in chronic kidney disease: Secondary | ICD-10-CM | POA: Diagnosis not present

## 2018-10-01 DIAGNOSIS — N186 End stage renal disease: Secondary | ICD-10-CM | POA: Diagnosis not present

## 2018-10-01 DIAGNOSIS — N2581 Secondary hyperparathyroidism of renal origin: Secondary | ICD-10-CM | POA: Diagnosis not present

## 2018-10-01 MED ORDER — ALPRAZOLAM 1 MG PO TABS: 1.0000 mg | ORAL_TABLET | Freq: Four times a day (QID) | ORAL | 2 refills | Status: DC

## 2018-10-01 MED ORDER — ZOLPIDEM TARTRATE ER 12.5 MG PO TBCR: 12.5000 mg | EXTENDED_RELEASE_TABLET | Freq: Every evening | ORAL | 2 refills | Status: DC | PRN

## 2018-10-01 MED ORDER — BUPROPION HCL ER (XL) 300 MG PO TB24: 300.0000 mg | ORAL_TABLET | ORAL | 2 refills | Status: DC

## 2018-10-01 MED ORDER — PRAZOSIN HCL 5 MG PO CAPS: 5.0000 mg | ORAL_CAPSULE | Freq: Every day | ORAL | 2 refills | Status: DC

## 2018-10-01 NOTE — Progress Notes (Signed)
Virtual Visit via Telephone Note  I connected with April Ward on 10/01/18 at  8:20 AM EDT by telephone and verified that I am speaking with the correct person using two identifiers.   I discussed the limitations, risks, security and privacy concerns of performing an evaluation and management service by telephone and the availability of in person appointments. I also discussed with the patient that there may be a patient responsible charge related to this service. The patient expressed understanding and agreed to proceed.     I discussed the assessment and treatment plan with the patient. The patient was provided an opportunity to ask questions and all were answered. The patient agreed with the plan and demonstrated an understanding of the instructions.   The patient was advised to call back or seek an in-person evaluation if the symptoms worsen or if the condition fails to improve as anticipated.  I provided 15 minutes of non-face-to-face time during this encounter.   [name], MD  [facility] MD/PA/NP OP Progress Note  10/01/2018 8:42 AM April Ward  MRN:  [id number]  Chief Complaint:  Chief Complaint    Depression; Anxiety; Follow-up     HPI: This patient is a 58 year old widowed black female who lives with her son and asked [place].  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  The patient returns after 3 months and is again evaluated by phone due to the coronavirus pandemic.  She states that she is doing okay and is still doing home dialysis.  She is now on the list for a kidney transplant and is just waiting for kidney to become available.  She states that her mood is generally pretty good now.  Her son is working from home and she is glad to have him with her.  She talks to family members on the phone.  She is spending a lot of time outside working on her garden which makes her feel good.  She is not sleeping all that well and is not sure that the Restoril is  helping.  She often wakes up in the middle of the night.  She does not think this is related to being on the dialysis during sleep.  I suggested that we try Ambien CR and she will give it a try.  She is no longer having the nightmares due to prazosin.  She states that her mood is generally pretty good and she denies significant depression suicidal ideation or anxiety.  She is trying to walk every day Visit Diagnosis:    ICD-10-CM   1. Major depressive disorder, single episode, severe without psychotic features ([place])  F32.2   2. GAD (generalized anxiety disorder)  F41.1     Past Psychiatric History: none  Past Medical History:  Past Medical History:  Diagnosis Date  . Anxiety   . Arthritis   . Depression   . Diabetes mellitus without complication ([place])   . GERD (gastroesophageal reflux disease)   . Gout   . Heart murmur   . Hypertension   . Renal cancer ([place])   . Renal disease 10/2012  . Renal insufficiency     Past Surgical History:  Procedure Laterality Date  . CESAREAN SECTION  [id number]  . CHONDROPLASTY Right 08/22/2012   Procedure: CHONDROPLASTY;  Surgeon: Carole Civil, MD;  Location: [facility];  Service: Orthopedics;  Laterality: Right;  . COLONOSCOPY WITH PROPOFOL N/A 03/16/2014   Procedure: COLONOSCOPY WITH PROPOFOL (at cecum 1023,  total withdrawal time=9 minutes);  Surgeon: Danie Binder, MD;  Location: [facility];  Service: Endoscopy;  Laterality: N/A;  . GASTRIC BYPASS    . KNEE ARTHROSCOPY WITH MEDIAL MENISECTOMY Right 08/22/2012   Procedure: KNEE ARTHROSCOPY WITH PARTIAL MEDIAL MENISECTOMY;  Surgeon: Carole Civil, MD;  Location: [facility];  Service: Orthopedics;  Laterality: Right;  . PARTIAL NEPHRECTOMY  Dec 2014   left  . TENDON REPAIR      Family Psychiatric History: see below  Family History:  Family History  Problem Relation Age of Onset  . Heart attack Mother   . Heart attack Father   . Depression Paternal Aunt   . Alcohol abuse Maternal Uncle   .  Colon cancer Maternal Aunt   . Colon cancer Maternal Uncle     Social History:  Social History   Socioeconomic History  . Marital status: Married    Spouse name: Not on file  . Number of children: Not on file  . Years of education: Not on file  . Highest education level: Not on file  Occupational History  . Occupation: Visual merchandiser: [employer]  Social Needs  . Financial resource strain: Not on file  . Food insecurity    Worry: Not on file    Inability: Not on file  . Transportation needs    Medical: Not on file    Non-medical: Not on file  Tobacco Use  . Smoking status: Never Smoker  . Smokeless tobacco: Never Used  Substance and Sexual Activity  . Alcohol use: No    Alcohol/week: 0.0 standard drinks  . Drug use: No  . Sexual activity: Not on file  Lifestyle  . Physical activity    Days per week: Not on file    Minutes per session: Not on file  . Stress: Not on file  Relationships  . Social Herbalist on phone: Not on file    Gets together: Not on file    Attends religious service: Not on file    Active member of club or organization: Not on file    Attends meetings of clubs or organizations: Not on file    Relationship status: Not on file  Other Topics Concern  . Not on file  Social History Narrative  . Not on file    Allergies:  Allergies  Allergen Reactions  . Skelaxin [Metaxalone] Hives and Rash    Metabolic Disorder Labs: No results found for: HGBA1C, MPG No results found for: PROLACTIN Lab Results  Component Value Date   CHOL 215 (H) 03/11/2014   TRIG 288 (H) 03/11/2014   HDL 33 (L) 03/11/2014   CHOLHDL 6.5 03/11/2014   VLDL 58 (H) 03/11/2014   LDLCALC 124 (H) 03/11/2014   No results found for: TSH  Therapeutic Level Labs: No results found for: LITHIUM No results found for: VALPROATE No components found for:  CBMZ  Current Medications: Current Outpatient Medications  Medication Sig Dispense  Refill  . ALPRAZolam (XANAX) 1 MG tablet Take 1 tablet (1 mg total) by mouth 4 (four) times daily. 120 tablet 2  . amLODipine (NORVASC) 10 MG tablet Take 1 tablet by mouth daily.    Marland Kitchen buPROPion (WELLBUTRIN XL) 300 MG 24 hr tablet Take 1 tablet (300 mg total) by mouth every morning. 90 tablet 2  . calcitRIOL (ROCALTROL) 0.25 MCG capsule Take 1 capsule by mouth 3 (three) times a week. M-W-F    . hydrALAZINE (APRESOLINE) 100  MG tablet Take 1 tablet (100 mg total) by mouth 3 (three) times daily. 90 tablet 1  . lactulose (CONSTULOSE) 10 GM/15ML solution Take 10 mLs by mouth 2 (two) times daily as needed for constipation.    Marland Kitchen levothyroxine (SYNTHROID, LEVOTHROID) 200 MCG tablet Take 1 tablet by mouth daily.    Marland Kitchen losartan (COZAAR) 50 MG tablet Take 0.5 tablets (25 mg total) by mouth daily. 15 tablet 0  . meclizine (ANTIVERT) 25 MG tablet Take 1 tablet (25 mg total) by mouth every 8 (eight) hours as needed for dizziness. 90 tablet 0  . multivitamin (RENA-VIT) TABS tablet Take 1 tablet by mouth daily.    . prazosin (MINIPRESS) 5 MG capsule Take 1 capsule (5 mg total) by mouth at bedtime. 90 capsule 2  . sevelamer carbonate (RENVELA) 800 MG tablet Take 800 mg by mouth daily.     . temazepam (RESTORIL) 30 MG capsule Take 1 capsule (30 mg total) by mouth at bedtime as needed. for sleep 30 capsule 2  . vitamin B-12 (CYANOCOBALAMIN) 500 MCG tablet Take 500 mcg by mouth daily.    Marland Kitchen zolpidem (AMBIEN CR) 12.5 MG CR tablet Take 1 tablet (12.5 mg total) by mouth at bedtime as needed for sleep. 30 tablet 2   No current facility-administered medications for this visit.      Musculoskeletal: Strength & Muscle Tone: within normal limits Gait & Station: normal Patient leans: N/A  Psychiatric Specialty Exam: Review of Systems  Psychiatric/Behavioral: The patient has insomnia.     There were no vitals taken for this visit.There is no height or weight on file to calculate BMI.  General Appearance: NA  Eye  Contact:  NA  Speech:  Clear and Coherent  Volume:  Normal  Mood:  Euthymic  Affect:  NA  Thought Process:  Goal Directed  Orientation:  Full (Time, Place, and Person)  Thought Content: Rumination   Suicidal Thoughts:  No  Homicidal Thoughts:  No  Memory:  Immediate;   Good Recent;   Good Remote;   Good  Judgement:  Good  Insight:  Good  Psychomotor Activity:  Normal  Concentration:  Concentration: Good and Attention Span: Good  Recall:  Good  Fund of Knowledge: Good  Language: Good  Akathisia:  No  Handed:  Right  AIMS (if indicated): not done  Assets:  Communication Skills Desire for Improvement Resilience Social Support Talents/Skills  ADL's:  Intact  Cognition: WNL  Sleep:  Poor   Screenings:   Assessment and Plan:  This patient is a 58 year old female with end-stage renal disease depression and anxiety.  She is doing well although her sleep is interrupted.  She will discontinue Restoril and try Ambien CR 12.5 mg at bedtime for sleep.  She will continue Wellbutrin XL 300 mg every morning for depression, Xanax 1 mg 4 times daily for anxiety and prazosin 5 mg at bedtime for nightmares.  She will return to see me in 3 months  [name], MD 10/01/2018, 8:42 AM

## 2018-10-02 ENCOUNTER — Telehealth (HOSPITAL_COMMUNITY): Payer: Self-pay | Admitting: *Deleted

## 2018-10-02 DIAGNOSIS — N186 End stage renal disease: Secondary | ICD-10-CM | POA: Diagnosis not present

## 2018-10-02 DIAGNOSIS — N2581 Secondary hyperparathyroidism of renal origin: Secondary | ICD-10-CM | POA: Diagnosis not present

## 2018-10-02 DIAGNOSIS — D631 Anemia in chronic kidney disease: Secondary | ICD-10-CM | POA: Diagnosis not present

## 2018-10-02 DIAGNOSIS — E119 Type 2 diabetes mellitus without complications: Secondary | ICD-10-CM | POA: Diagnosis not present

## 2018-10-02 DIAGNOSIS — D509 Iron deficiency anemia, unspecified: Secondary | ICD-10-CM | POA: Diagnosis not present

## 2018-10-02 NOTE — Telephone Encounter (Signed)
[employer] APPROVED COVERAGE ZOLPIDEM TART ER 12.5 MG TAB   AUTHORIZATION IS GOOD UNTIL 04/02/2019

## 2018-10-03 DIAGNOSIS — D509 Iron deficiency anemia, unspecified: Secondary | ICD-10-CM | POA: Diagnosis not present

## 2018-10-03 DIAGNOSIS — D631 Anemia in chronic kidney disease: Secondary | ICD-10-CM | POA: Diagnosis not present

## 2018-10-03 DIAGNOSIS — N2581 Secondary hyperparathyroidism of renal origin: Secondary | ICD-10-CM | POA: Diagnosis not present

## 2018-10-03 DIAGNOSIS — N186 End stage renal disease: Secondary | ICD-10-CM | POA: Diagnosis not present

## 2018-10-04 DIAGNOSIS — N186 End stage renal disease: Secondary | ICD-10-CM | POA: Diagnosis not present

## 2018-10-04 DIAGNOSIS — D509 Iron deficiency anemia, unspecified: Secondary | ICD-10-CM | POA: Diagnosis not present

## 2018-10-04 DIAGNOSIS — D631 Anemia in chronic kidney disease: Secondary | ICD-10-CM | POA: Diagnosis not present

## 2018-10-04 DIAGNOSIS — N2581 Secondary hyperparathyroidism of renal origin: Secondary | ICD-10-CM | POA: Diagnosis not present

## 2018-10-05 DIAGNOSIS — D509 Iron deficiency anemia, unspecified: Secondary | ICD-10-CM | POA: Diagnosis not present

## 2018-10-05 DIAGNOSIS — D631 Anemia in chronic kidney disease: Secondary | ICD-10-CM | POA: Diagnosis not present

## 2018-10-05 DIAGNOSIS — N2581 Secondary hyperparathyroidism of renal origin: Secondary | ICD-10-CM | POA: Diagnosis not present

## 2018-10-05 DIAGNOSIS — N186 End stage renal disease: Secondary | ICD-10-CM | POA: Diagnosis not present

## 2018-10-06 DIAGNOSIS — D631 Anemia in chronic kidney disease: Secondary | ICD-10-CM | POA: Diagnosis not present

## 2018-10-06 DIAGNOSIS — D509 Iron deficiency anemia, unspecified: Secondary | ICD-10-CM | POA: Diagnosis not present

## 2018-10-06 DIAGNOSIS — N2581 Secondary hyperparathyroidism of renal origin: Secondary | ICD-10-CM | POA: Diagnosis not present

## 2018-10-06 DIAGNOSIS — N186 End stage renal disease: Secondary | ICD-10-CM | POA: Diagnosis not present

## 2018-10-07 DIAGNOSIS — N2581 Secondary hyperparathyroidism of renal origin: Secondary | ICD-10-CM | POA: Diagnosis not present

## 2018-10-07 DIAGNOSIS — N186 End stage renal disease: Secondary | ICD-10-CM | POA: Diagnosis not present

## 2018-10-07 DIAGNOSIS — D509 Iron deficiency anemia, unspecified: Secondary | ICD-10-CM | POA: Diagnosis not present

## 2018-10-07 DIAGNOSIS — D631 Anemia in chronic kidney disease: Secondary | ICD-10-CM | POA: Diagnosis not present

## 2018-10-08 DIAGNOSIS — N186 End stage renal disease: Secondary | ICD-10-CM | POA: Diagnosis not present

## 2018-10-08 DIAGNOSIS — D631 Anemia in chronic kidney disease: Secondary | ICD-10-CM | POA: Diagnosis not present

## 2018-10-08 DIAGNOSIS — D509 Iron deficiency anemia, unspecified: Secondary | ICD-10-CM | POA: Diagnosis not present

## 2018-10-08 DIAGNOSIS — N2581 Secondary hyperparathyroidism of renal origin: Secondary | ICD-10-CM | POA: Diagnosis not present

## 2018-10-09 DIAGNOSIS — N2581 Secondary hyperparathyroidism of renal origin: Secondary | ICD-10-CM | POA: Diagnosis not present

## 2018-10-09 DIAGNOSIS — D631 Anemia in chronic kidney disease: Secondary | ICD-10-CM | POA: Diagnosis not present

## 2018-10-09 DIAGNOSIS — N186 End stage renal disease: Secondary | ICD-10-CM | POA: Diagnosis not present

## 2018-10-09 DIAGNOSIS — D509 Iron deficiency anemia, unspecified: Secondary | ICD-10-CM | POA: Diagnosis not present

## 2018-10-10 DIAGNOSIS — N186 End stage renal disease: Secondary | ICD-10-CM | POA: Diagnosis not present

## 2018-10-10 DIAGNOSIS — N2581 Secondary hyperparathyroidism of renal origin: Secondary | ICD-10-CM | POA: Diagnosis not present

## 2018-10-10 DIAGNOSIS — D631 Anemia in chronic kidney disease: Secondary | ICD-10-CM | POA: Diagnosis not present

## 2018-10-10 DIAGNOSIS — D509 Iron deficiency anemia, unspecified: Secondary | ICD-10-CM | POA: Diagnosis not present

## 2018-10-11 DIAGNOSIS — N186 End stage renal disease: Secondary | ICD-10-CM | POA: Diagnosis not present

## 2018-10-12 DIAGNOSIS — N186 End stage renal disease: Secondary | ICD-10-CM | POA: Diagnosis not present

## 2018-10-13 DIAGNOSIS — N186 End stage renal disease: Secondary | ICD-10-CM | POA: Diagnosis not present

## 2018-10-13 DIAGNOSIS — M793 Panniculitis, unspecified: Secondary | ICD-10-CM | POA: Diagnosis not present

## 2018-10-13 DIAGNOSIS — Z992 Dependence on renal dialysis: Secondary | ICD-10-CM | POA: Diagnosis not present

## 2018-10-14 DIAGNOSIS — N186 End stage renal disease: Secondary | ICD-10-CM | POA: Diagnosis not present

## 2018-10-15 DIAGNOSIS — N186 End stage renal disease: Secondary | ICD-10-CM | POA: Diagnosis not present

## 2018-10-16 DIAGNOSIS — N186 End stage renal disease: Secondary | ICD-10-CM | POA: Diagnosis not present

## 2018-10-17 DIAGNOSIS — N186 End stage renal disease: Secondary | ICD-10-CM | POA: Diagnosis not present

## 2018-10-18 DIAGNOSIS — N186 End stage renal disease: Secondary | ICD-10-CM | POA: Diagnosis not present

## 2018-10-19 DIAGNOSIS — N186 End stage renal disease: Secondary | ICD-10-CM | POA: Diagnosis not present

## 2018-10-20 DIAGNOSIS — N186 End stage renal disease: Secondary | ICD-10-CM | POA: Diagnosis not present

## 2018-10-21 DIAGNOSIS — N186 End stage renal disease: Secondary | ICD-10-CM | POA: Diagnosis not present

## 2018-10-22 DIAGNOSIS — Z992 Dependence on renal dialysis: Secondary | ICD-10-CM | POA: Diagnosis not present

## 2018-10-22 DIAGNOSIS — Z01812 Encounter for preprocedural laboratory examination: Secondary | ICD-10-CM | POA: Diagnosis not present

## 2018-10-22 DIAGNOSIS — N186 End stage renal disease: Secondary | ICD-10-CM | POA: Diagnosis not present

## 2018-10-22 DIAGNOSIS — Z1159 Encounter for screening for other viral diseases: Secondary | ICD-10-CM | POA: Diagnosis not present

## 2018-10-23 DIAGNOSIS — N186 End stage renal disease: Secondary | ICD-10-CM | POA: Diagnosis not present

## 2018-10-24 ENCOUNTER — Ambulatory Visit: Payer: Medicare PPO | Admitting: Podiatry

## 2018-10-24 DIAGNOSIS — N186 End stage renal disease: Secondary | ICD-10-CM | POA: Diagnosis not present

## 2018-10-25 DIAGNOSIS — N186 End stage renal disease: Secondary | ICD-10-CM | POA: Diagnosis not present

## 2018-10-26 DIAGNOSIS — N186 End stage renal disease: Secondary | ICD-10-CM | POA: Diagnosis not present

## 2018-10-27 DIAGNOSIS — N186 End stage renal disease: Secondary | ICD-10-CM | POA: Diagnosis not present

## 2018-10-28 DIAGNOSIS — N186 End stage renal disease: Secondary | ICD-10-CM | POA: Diagnosis not present

## 2018-10-28 DIAGNOSIS — Z0183 Encounter for blood typing: Secondary | ICD-10-CM | POA: Diagnosis not present

## 2018-10-28 DIAGNOSIS — M793 Panniculitis, unspecified: Secondary | ICD-10-CM | POA: Diagnosis not present

## 2018-10-29 DIAGNOSIS — N186 End stage renal disease: Secondary | ICD-10-CM | POA: Diagnosis not present

## 2018-10-30 DIAGNOSIS — N186 End stage renal disease: Secondary | ICD-10-CM | POA: Diagnosis not present

## 2018-10-31 DIAGNOSIS — Z992 Dependence on renal dialysis: Secondary | ICD-10-CM | POA: Diagnosis not present

## 2018-10-31 DIAGNOSIS — N186 End stage renal disease: Secondary | ICD-10-CM | POA: Diagnosis not present

## 2018-11-01 DIAGNOSIS — D631 Anemia in chronic kidney disease: Secondary | ICD-10-CM | POA: Diagnosis not present

## 2018-11-01 DIAGNOSIS — N186 End stage renal disease: Secondary | ICD-10-CM | POA: Diagnosis not present

## 2018-11-01 DIAGNOSIS — N2581 Secondary hyperparathyroidism of renal origin: Secondary | ICD-10-CM | POA: Diagnosis not present

## 2018-11-01 DIAGNOSIS — D509 Iron deficiency anemia, unspecified: Secondary | ICD-10-CM | POA: Diagnosis not present

## 2018-11-02 DIAGNOSIS — D631 Anemia in chronic kidney disease: Secondary | ICD-10-CM | POA: Diagnosis not present

## 2018-11-02 DIAGNOSIS — N186 End stage renal disease: Secondary | ICD-10-CM | POA: Diagnosis not present

## 2018-11-02 DIAGNOSIS — N2581 Secondary hyperparathyroidism of renal origin: Secondary | ICD-10-CM | POA: Diagnosis not present

## 2018-11-02 DIAGNOSIS — D509 Iron deficiency anemia, unspecified: Secondary | ICD-10-CM | POA: Diagnosis not present

## 2018-11-03 DIAGNOSIS — N2581 Secondary hyperparathyroidism of renal origin: Secondary | ICD-10-CM | POA: Diagnosis not present

## 2018-11-03 DIAGNOSIS — N186 End stage renal disease: Secondary | ICD-10-CM | POA: Diagnosis not present

## 2018-11-03 DIAGNOSIS — D509 Iron deficiency anemia, unspecified: Secondary | ICD-10-CM | POA: Diagnosis not present

## 2018-11-03 DIAGNOSIS — D631 Anemia in chronic kidney disease: Secondary | ICD-10-CM | POA: Diagnosis not present

## 2018-11-04 DIAGNOSIS — D509 Iron deficiency anemia, unspecified: Secondary | ICD-10-CM | POA: Diagnosis not present

## 2018-11-04 DIAGNOSIS — N2581 Secondary hyperparathyroidism of renal origin: Secondary | ICD-10-CM | POA: Diagnosis not present

## 2018-11-04 DIAGNOSIS — N186 End stage renal disease: Secondary | ICD-10-CM | POA: Diagnosis not present

## 2018-11-04 DIAGNOSIS — D631 Anemia in chronic kidney disease: Secondary | ICD-10-CM | POA: Diagnosis not present

## 2018-11-05 DIAGNOSIS — D631 Anemia in chronic kidney disease: Secondary | ICD-10-CM | POA: Diagnosis not present

## 2018-11-05 DIAGNOSIS — D509 Iron deficiency anemia, unspecified: Secondary | ICD-10-CM | POA: Diagnosis not present

## 2018-11-05 DIAGNOSIS — Z4932 Encounter for adequacy testing for peritoneal dialysis: Secondary | ICD-10-CM | POA: Diagnosis not present

## 2018-11-05 DIAGNOSIS — N2581 Secondary hyperparathyroidism of renal origin: Secondary | ICD-10-CM | POA: Diagnosis not present

## 2018-11-05 DIAGNOSIS — E119 Type 2 diabetes mellitus without complications: Secondary | ICD-10-CM | POA: Diagnosis not present

## 2018-11-05 DIAGNOSIS — N186 End stage renal disease: Secondary | ICD-10-CM | POA: Diagnosis not present

## 2018-11-06 DIAGNOSIS — N2581 Secondary hyperparathyroidism of renal origin: Secondary | ICD-10-CM | POA: Diagnosis not present

## 2018-11-06 DIAGNOSIS — N186 End stage renal disease: Secondary | ICD-10-CM | POA: Diagnosis not present

## 2018-11-06 DIAGNOSIS — D509 Iron deficiency anemia, unspecified: Secondary | ICD-10-CM | POA: Diagnosis not present

## 2018-11-06 DIAGNOSIS — D631 Anemia in chronic kidney disease: Secondary | ICD-10-CM | POA: Diagnosis not present

## 2018-11-07 DIAGNOSIS — N2581 Secondary hyperparathyroidism of renal origin: Secondary | ICD-10-CM | POA: Diagnosis not present

## 2018-11-07 DIAGNOSIS — D631 Anemia in chronic kidney disease: Secondary | ICD-10-CM | POA: Diagnosis not present

## 2018-11-07 DIAGNOSIS — D509 Iron deficiency anemia, unspecified: Secondary | ICD-10-CM | POA: Diagnosis not present

## 2018-11-07 DIAGNOSIS — N186 End stage renal disease: Secondary | ICD-10-CM | POA: Diagnosis not present

## 2018-11-08 DIAGNOSIS — D509 Iron deficiency anemia, unspecified: Secondary | ICD-10-CM | POA: Diagnosis not present

## 2018-11-08 DIAGNOSIS — D631 Anemia in chronic kidney disease: Secondary | ICD-10-CM | POA: Diagnosis not present

## 2018-11-08 DIAGNOSIS — N2581 Secondary hyperparathyroidism of renal origin: Secondary | ICD-10-CM | POA: Diagnosis not present

## 2018-11-08 DIAGNOSIS — N186 End stage renal disease: Secondary | ICD-10-CM | POA: Diagnosis not present

## 2018-11-09 DIAGNOSIS — N186 End stage renal disease: Secondary | ICD-10-CM | POA: Diagnosis not present

## 2018-11-09 DIAGNOSIS — N2581 Secondary hyperparathyroidism of renal origin: Secondary | ICD-10-CM | POA: Diagnosis not present

## 2018-11-09 DIAGNOSIS — D631 Anemia in chronic kidney disease: Secondary | ICD-10-CM | POA: Diagnosis not present

## 2018-11-09 DIAGNOSIS — D509 Iron deficiency anemia, unspecified: Secondary | ICD-10-CM | POA: Diagnosis not present

## 2018-11-10 DIAGNOSIS — D509 Iron deficiency anemia, unspecified: Secondary | ICD-10-CM | POA: Diagnosis not present

## 2018-11-10 DIAGNOSIS — N186 End stage renal disease: Secondary | ICD-10-CM | POA: Diagnosis not present

## 2018-11-10 DIAGNOSIS — N2581 Secondary hyperparathyroidism of renal origin: Secondary | ICD-10-CM | POA: Diagnosis not present

## 2018-11-10 DIAGNOSIS — D631 Anemia in chronic kidney disease: Secondary | ICD-10-CM | POA: Diagnosis not present

## 2018-11-11 DIAGNOSIS — Z9884 Bariatric surgery status: Secondary | ICD-10-CM | POA: Diagnosis not present

## 2018-11-11 DIAGNOSIS — Z992 Dependence on renal dialysis: Secondary | ICD-10-CM | POA: Diagnosis not present

## 2018-11-11 DIAGNOSIS — N186 End stage renal disease: Secondary | ICD-10-CM | POA: Diagnosis not present

## 2018-11-12 DIAGNOSIS — N186 End stage renal disease: Secondary | ICD-10-CM | POA: Diagnosis not present

## 2018-11-13 DIAGNOSIS — N186 End stage renal disease: Secondary | ICD-10-CM | POA: Diagnosis not present

## 2018-11-14 DIAGNOSIS — N186 End stage renal disease: Secondary | ICD-10-CM | POA: Diagnosis not present

## 2018-11-14 NOTE — Telephone Encounter (Signed)
I would consider increasing her losartan but would leave that up to her nephrologist who had decreased it initially. Would ask she touch base with her nephrologist to see if increasing losartan is possible as this would be easiest change   Zandra Abts MD

## 2018-11-15 DIAGNOSIS — N186 End stage renal disease: Secondary | ICD-10-CM | POA: Diagnosis not present

## 2018-11-16 DIAGNOSIS — N186 End stage renal disease: Secondary | ICD-10-CM | POA: Diagnosis not present

## 2018-11-17 DIAGNOSIS — N186 End stage renal disease: Secondary | ICD-10-CM | POA: Diagnosis not present

## 2018-11-18 DIAGNOSIS — N186 End stage renal disease: Secondary | ICD-10-CM | POA: Diagnosis not present

## 2018-11-19 DIAGNOSIS — N186 End stage renal disease: Secondary | ICD-10-CM | POA: Diagnosis not present

## 2018-11-20 DIAGNOSIS — N186 End stage renal disease: Secondary | ICD-10-CM | POA: Diagnosis not present

## 2018-11-21 DIAGNOSIS — N186 End stage renal disease: Secondary | ICD-10-CM | POA: Diagnosis not present

## 2018-11-22 DIAGNOSIS — N186 End stage renal disease: Secondary | ICD-10-CM | POA: Diagnosis not present

## 2018-11-23 DIAGNOSIS — N186 End stage renal disease: Secondary | ICD-10-CM | POA: Diagnosis not present

## 2018-11-24 DIAGNOSIS — N186 End stage renal disease: Secondary | ICD-10-CM | POA: Diagnosis not present

## 2018-11-25 ENCOUNTER — Other Ambulatory Visit (HOSPITAL_COMMUNITY): Payer: Self-pay | Admitting: Psychiatry

## 2018-11-25 DIAGNOSIS — N186 End stage renal disease: Secondary | ICD-10-CM | POA: Diagnosis not present

## 2018-11-26 DIAGNOSIS — N186 End stage renal disease: Secondary | ICD-10-CM | POA: Diagnosis not present

## 2018-11-27 DIAGNOSIS — N186 End stage renal disease: Secondary | ICD-10-CM | POA: Diagnosis not present

## 2018-11-28 DIAGNOSIS — N186 End stage renal disease: Secondary | ICD-10-CM | POA: Diagnosis not present

## 2018-11-29 DIAGNOSIS — N186 End stage renal disease: Secondary | ICD-10-CM | POA: Diagnosis not present

## 2018-11-30 DIAGNOSIS — N186 End stage renal disease: Secondary | ICD-10-CM | POA: Diagnosis not present

## 2018-12-01 DIAGNOSIS — N186 End stage renal disease: Secondary | ICD-10-CM | POA: Diagnosis not present

## 2018-12-01 DIAGNOSIS — Z992 Dependence on renal dialysis: Secondary | ICD-10-CM | POA: Diagnosis not present

## 2018-12-02 DIAGNOSIS — N2581 Secondary hyperparathyroidism of renal origin: Secondary | ICD-10-CM | POA: Diagnosis not present

## 2018-12-02 DIAGNOSIS — C73 Malignant neoplasm of thyroid gland: Secondary | ICD-10-CM | POA: Diagnosis not present

## 2018-12-02 DIAGNOSIS — I440 Atrioventricular block, first degree: Secondary | ICD-10-CM | POA: Diagnosis not present

## 2018-12-02 DIAGNOSIS — Z992 Dependence on renal dialysis: Secondary | ICD-10-CM | POA: Diagnosis not present

## 2018-12-02 DIAGNOSIS — D509 Iron deficiency anemia, unspecified: Secondary | ICD-10-CM | POA: Diagnosis not present

## 2018-12-02 DIAGNOSIS — M793 Panniculitis, unspecified: Secondary | ICD-10-CM | POA: Diagnosis not present

## 2018-12-02 DIAGNOSIS — I120 Hypertensive chronic kidney disease with stage 5 chronic kidney disease or end stage renal disease: Secondary | ICD-10-CM | POA: Diagnosis not present

## 2018-12-02 DIAGNOSIS — E1122 Type 2 diabetes mellitus with diabetic chronic kidney disease: Secondary | ICD-10-CM | POA: Diagnosis not present

## 2018-12-02 DIAGNOSIS — D631 Anemia in chronic kidney disease: Secondary | ICD-10-CM | POA: Diagnosis not present

## 2018-12-02 DIAGNOSIS — Z7682 Awaiting organ transplant status: Secondary | ICD-10-CM | POA: Diagnosis not present

## 2018-12-02 DIAGNOSIS — E119 Type 2 diabetes mellitus without complications: Secondary | ICD-10-CM | POA: Diagnosis not present

## 2018-12-02 DIAGNOSIS — M6208 Separation of muscle (nontraumatic), other site: Secondary | ICD-10-CM | POA: Diagnosis not present

## 2018-12-02 DIAGNOSIS — E669 Obesity, unspecified: Secondary | ICD-10-CM | POA: Diagnosis not present

## 2018-12-02 DIAGNOSIS — N186 End stage renal disease: Secondary | ICD-10-CM | POA: Diagnosis not present

## 2018-12-03 ENCOUNTER — Encounter: Payer: Self-pay | Admitting: *Deleted

## 2018-12-03 DIAGNOSIS — Z992 Dependence on renal dialysis: Secondary | ICD-10-CM | POA: Diagnosis not present

## 2018-12-03 DIAGNOSIS — N2581 Secondary hyperparathyroidism of renal origin: Secondary | ICD-10-CM | POA: Diagnosis not present

## 2018-12-03 DIAGNOSIS — D509 Iron deficiency anemia, unspecified: Secondary | ICD-10-CM | POA: Diagnosis not present

## 2018-12-03 DIAGNOSIS — N186 End stage renal disease: Secondary | ICD-10-CM | POA: Diagnosis not present

## 2018-12-03 DIAGNOSIS — D631 Anemia in chronic kidney disease: Secondary | ICD-10-CM | POA: Diagnosis not present

## 2018-12-04 ENCOUNTER — Other Ambulatory Visit: Payer: Self-pay

## 2018-12-04 ENCOUNTER — Ambulatory Visit (INDEPENDENT_AMBULATORY_CARE_PROVIDER_SITE_OTHER): Payer: Medicare PPO | Admitting: Cardiology

## 2018-12-04 ENCOUNTER — Encounter: Payer: Self-pay | Admitting: Cardiology

## 2018-12-04 VITALS — BP 125/70 | HR 62 | Ht 71.0 in | Wt 213.2 lb

## 2018-12-04 DIAGNOSIS — I10 Essential (primary) hypertension: Secondary | ICD-10-CM

## 2018-12-04 DIAGNOSIS — D509 Iron deficiency anemia, unspecified: Secondary | ICD-10-CM | POA: Diagnosis not present

## 2018-12-04 DIAGNOSIS — Z01810 Encounter for preprocedural cardiovascular examination: Secondary | ICD-10-CM

## 2018-12-04 DIAGNOSIS — N2581 Secondary hyperparathyroidism of renal origin: Secondary | ICD-10-CM | POA: Diagnosis not present

## 2018-12-04 DIAGNOSIS — N186 End stage renal disease: Secondary | ICD-10-CM | POA: Diagnosis not present

## 2018-12-04 DIAGNOSIS — D631 Anemia in chronic kidney disease: Secondary | ICD-10-CM | POA: Diagnosis not present

## 2018-12-04 DIAGNOSIS — Z992 Dependence on renal dialysis: Secondary | ICD-10-CM | POA: Diagnosis not present

## 2018-12-04 MED ORDER — LOSARTAN POTASSIUM 50 MG PO TABS: 50.0000 mg | ORAL_TABLET | Freq: Every day | ORAL | 1 refills | Status: DC

## 2018-12-04 MED ORDER — LOSARTAN POTASSIUM 50 MG PO TABS: 50.0000 mg | ORAL_TABLET | Freq: Every day | ORAL | 0 refills | Status: DC

## 2018-12-04 NOTE — Progress Notes (Signed)
Clinical Summary April Ward is a 58 y.o.female  seen today for follow up of the following medical problems.   1. Syncope - episode on Monday while at home in kitchen at [place] dizzy. Felt dizzy. She reports heart rate was around 48. Fell to floor. LOC for just a few seconds. No other episodes - had been feeling well prior.  - Poor oral intake that Sunday. -Cr 3.10, K 3.3. Hgb 10.9, MCV 73,   - no recurrent espides   2. HTN  - reports diagnosed with HTN age 23. Historically has been difficult to control - CT scan in 2006 with normal renal arteries. TSH 1.80. No EtoH. Normal renin and aldo ratio. Normal sleep study 10/2013 at [facility].  - since recent gastric sleeve surgery she has had significant weight loss, bp's have trended down and she has been taken off some of her bp meds  - compliant with meds, she is taking her losartan 50mg  daily as opposed to 25mg  daily - home bps 130s/70s.   3. Obesity - had gastric sleeve procedure done, significant weight loss since that time.   4. ESRD - followed Dr Kris Mouton at [facility].  - on home peritoneal HD   5. History of renal cell CA - s/p left partial nephrectomy in 2014  6. Preoperative evaluation - Jan 2020 stress echo [facility] no ischemia - upcoming abdominoplasty at [facility] later this month - walks 3-5 miles daily without troubles. 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 Anxiety   . Arthritis   . Depression   . Diabetes mellitus without complication ([place])   . GERD (gastroesophageal reflux disease)   . Gout   . Heart murmur   . Hypertension   . Renal cancer ([place])   . Renal disease 10/2012  . Renal insufficiency      Allergies  Allergen Reactions  . Skelaxin [Metaxalone] Hives and Rash     Current Outpatient Medications  Medication Sig Dispense Refill  .  ALPRAZolam (XANAX) 1 MG tablet Take 1 tablet (1 mg total) by mouth 4 (four) times daily. 120 tablet 2  . amLODipine (NORVASC) 10 MG tablet Take 1 tablet by mouth daily.    Marland Kitchen buPROPion (WELLBUTRIN XL) 300 MG 24 hr tablet Take 1 tablet (300 mg total) by mouth every morning. 90 tablet 2  . calcitRIOL (ROCALTROL) 0.25 MCG capsule Take 1 capsule by mouth 3 (three) times a week. M-W-F    . hydrALAZINE (APRESOLINE) 100 MG tablet Take 1 tablet (100 mg total) by mouth 3 (three) times daily. 90 tablet 1  . lactulose (CONSTULOSE) 10 GM/15ML solution Take 10 mLs by mouth 2 (two) times daily as needed for constipation.    Marland Kitchen levothyroxine (SYNTHROID, LEVOTHROID) 200 MCG tablet Take 1 tablet by mouth daily.    Marland Kitchen losartan (COZAAR) 50 MG tablet Take 0.5 tablets (25 mg total) by mouth daily. 15 tablet 0  . meclizine (ANTIVERT) 25 MG tablet Take 1 tablet (25 mg total) by mouth every 8 (eight) hours as needed for dizziness. 90 tablet 0  . multivitamin (RENA-VIT) TABS tablet Take 1 tablet by mouth daily.    . prazosin (MINIPRESS) 5 MG capsule Take 1 capsule (5 mg total) by mouth at bedtime. 90 capsule 2  . sevelamer carbonate (RENVELA) 800 MG tablet Take 800 mg by mouth  daily.     . temazepam (RESTORIL) 30 MG capsule Take 1 capsule (30 mg total) by mouth at bedtime as needed. for sleep 30 capsule 2  . vitamin B-12 (CYANOCOBALAMIN) 500 MCG tablet Take 500 mcg by mouth daily.    Marland Kitchen zolpidem (AMBIEN CR) 12.5 MG CR tablet Take 1 tablet (12.5 mg total) by mouth at bedtime as needed for sleep. 30 tablet 2   No current facility-administered medications for this visit.      Past Surgical History:  Procedure Laterality Date  . CESAREAN SECTION  [id number]  . CHONDROPLASTY Right 08/22/2012   Procedure: CHONDROPLASTY;  Surgeon: Carole Civil, MD;  Location: [facility];  Service: Orthopedics;  Laterality: Right;  . COLONOSCOPY WITH PROPOFOL N/A 03/16/2014   Procedure: COLONOSCOPY WITH PROPOFOL (at cecum 1023, total  withdrawal time=9 minutes);  Surgeon: Danie Binder, MD;  Location: [facility];  Service: Endoscopy;  Laterality: N/A;  . GASTRIC BYPASS    . KNEE ARTHROSCOPY WITH MEDIAL MENISECTOMY Right 08/22/2012   Procedure: KNEE ARTHROSCOPY WITH PARTIAL MEDIAL MENISECTOMY;  Surgeon: Carole Civil, MD;  Location: [facility];  Service: Orthopedics;  Laterality: Right;  . PARTIAL NEPHRECTOMY  Dec 2014   left  . TENDON REPAIR       Allergies  Allergen Reactions  . Skelaxin [Metaxalone] Hives and Rash      Family History  Problem Relation Age of Onset  . Heart attack Mother   . Heart attack Father   . Depression Paternal Aunt   . Alcohol abuse Maternal Uncle   . Colon cancer Maternal Aunt   . Colon cancer Maternal Uncle      Social History April Ward reports that she has never smoked. She has never used smokeless tobacco. April Ward reports no history of alcohol use.   Review of Systems CONSTITUTIONAL: No weight loss, fever, chills, weakness or fatigue.  HEENT: Eyes: No visual loss, blurred vision, double vision or yellow sclerae.No hearing loss, sneezing, congestion, runny nose or sore throat.  SKIN: No rash or itching.  CARDIOVASCULAR: per hpi RESPIRATORY: No shortness of breath, cough or sputum.  GASTROINTESTINAL: No anorexia, nausea, vomiting or diarrhea. No abdominal pain or blood.  GENITOURINARY: No burning on urination, no polyuria NEUROLOGICAL: No headache, dizziness, syncope, paralysis, ataxia, numbness or tingling in the extremities. No change in bowel or bladder control.  MUSCULOSKELETAL: No muscle, back pain, joint pain or stiffness.  LYMPHATICS: No enlarged nodes. No history of splenectomy.  PSYCHIATRIC: No history of depression or anxiety.  ENDOCRINOLOGIC: No reports of sweating, cold or heat intolerance. No polyuria or polydipsia.  Marland Kitchen   Physical Examination Today's Vitals   12/04/18 1015  BP: 125/70  Pulse: 62  SpO2: 99%  Weight: 213 lb 3.2 oz (96.7 kg)  Height: 5\' 11"   (1.803 m)   Body mass index is 29.74 kg/m.  Gen: resting comfortably, no acute distress HEENT: no scleral icterus, pupils equal round and reactive, no palptable cervical adenopathy,  CV: RRR, 2/6 systolic murmur RUSB Resp: Clear to auscultation bilaterally GI: abdomen is soft, non-tender, non-distended, normal bowel sounds, no hepatosplenomegaly MSK: extremities are warm, no edema.  Skin: warm, no rash Neuro:  no focal deficits Psych: appropriate affect   Diagnostic Studies  09/2013 Carotid US 1-39% bilateral ICA disease  09/2013 Exercise MPI Overall low risk exercise/Lexiscan Cardiolite. Patient had limited exercise capacity achieving maximum work load of 7 METS, limited by shortness of breath and hypertensive response. There were no clearly diagnostic ST segment  abnormalities. Occasional to frequent PVCs and ventricular couplets were noted early in exercise. There were no sustained arrhythmias. Perfusion imaging shows probable variable soft tissue attenuation, less likely a minor degree of basal lateral ischemia. LVEF is normal at 61% with upper normal chamber volume and no obvious wall motion abnormalities.   Jan 2020 Stress echo [facility] eval NORMAL STRESS TEST.  NO VALVULAR REGURGITATION  NO VALVULAR STENOSIS  Maximum workload of 10.10 METs was achieved during exercise.  RESTING HYPERTENSION - EXAGGERATED RESPONSE   Jan 2020 echo NORMAL LEFT VENTRICULAR SYSTOLIC FUNCTION WITH MODERATE LVH  NORMAL RIGHT VENTRICULAR SYSTOLIC FUNCTION  VALVULAR REGURGITATION: MILD MR, TRIVIAL PR, TRIVIAL TR  VALVULAR STENOSIS: TRIVIAL AS  NO PRIOR STUDY FOR COMPARISON  3D acquisition and reconstructions were performed as part of this  examination to more accurately quantify the effects of heart failure  regardless of ejection fraction. (post-processing on an Independent  workstation).  Assessment and Plan   1. HTN - at goal, continue current meds  2.  Preoperative evaluation - plans for abdominoplasty later this month - tolerates greater than 4 METs regularly without symptoms. - extensive cardiac workup at [facility] in Jan 2020 for transplant eval including echo and stress echo that were benign - recommend proceeding with surgery as planned    F/u 1 year  Arnoldo Lenis, M.D.

## 2018-12-04 NOTE — Patient Instructions (Signed)
Your physician wants you to follow-up in: [place] will receive a reminder letter in the mail two months in advance. If you don't receive a letter, please call our office to schedule the follow-up appointment.  Your physician has recommended you make the following change in your medication:   CHANGE LOSARTAN 50 MG DAILY   Thank you for choosing [facility]!!

## 2018-12-05 DIAGNOSIS — D509 Iron deficiency anemia, unspecified: Secondary | ICD-10-CM | POA: Diagnosis not present

## 2018-12-05 DIAGNOSIS — N2581 Secondary hyperparathyroidism of renal origin: Secondary | ICD-10-CM | POA: Diagnosis not present

## 2018-12-05 DIAGNOSIS — Z1159 Encounter for screening for other viral diseases: Secondary | ICD-10-CM | POA: Diagnosis not present

## 2018-12-05 DIAGNOSIS — Z992 Dependence on renal dialysis: Secondary | ICD-10-CM | POA: Diagnosis not present

## 2018-12-05 DIAGNOSIS — D631 Anemia in chronic kidney disease: Secondary | ICD-10-CM | POA: Diagnosis not present

## 2018-12-05 DIAGNOSIS — N186 End stage renal disease: Secondary | ICD-10-CM | POA: Diagnosis not present

## 2018-12-06 DIAGNOSIS — D509 Iron deficiency anemia, unspecified: Secondary | ICD-10-CM | POA: Diagnosis not present

## 2018-12-06 DIAGNOSIS — N186 End stage renal disease: Secondary | ICD-10-CM | POA: Diagnosis not present

## 2018-12-06 DIAGNOSIS — N2581 Secondary hyperparathyroidism of renal origin: Secondary | ICD-10-CM | POA: Diagnosis not present

## 2018-12-06 DIAGNOSIS — Z992 Dependence on renal dialysis: Secondary | ICD-10-CM | POA: Diagnosis not present

## 2018-12-06 DIAGNOSIS — D631 Anemia in chronic kidney disease: Secondary | ICD-10-CM | POA: Diagnosis not present

## 2018-12-07 DIAGNOSIS — D631 Anemia in chronic kidney disease: Secondary | ICD-10-CM | POA: Diagnosis not present

## 2018-12-07 DIAGNOSIS — N2581 Secondary hyperparathyroidism of renal origin: Secondary | ICD-10-CM | POA: Diagnosis not present

## 2018-12-07 DIAGNOSIS — N186 End stage renal disease: Secondary | ICD-10-CM | POA: Diagnosis not present

## 2018-12-07 DIAGNOSIS — D509 Iron deficiency anemia, unspecified: Secondary | ICD-10-CM | POA: Diagnosis not present

## 2018-12-07 DIAGNOSIS — Z992 Dependence on renal dialysis: Secondary | ICD-10-CM | POA: Diagnosis not present

## 2018-12-08 DIAGNOSIS — N2581 Secondary hyperparathyroidism of renal origin: Secondary | ICD-10-CM | POA: Diagnosis not present

## 2018-12-08 DIAGNOSIS — Z992 Dependence on renal dialysis: Secondary | ICD-10-CM | POA: Diagnosis not present

## 2018-12-08 DIAGNOSIS — D509 Iron deficiency anemia, unspecified: Secondary | ICD-10-CM | POA: Diagnosis not present

## 2018-12-08 DIAGNOSIS — D631 Anemia in chronic kidney disease: Secondary | ICD-10-CM | POA: Diagnosis not present

## 2018-12-08 DIAGNOSIS — N186 End stage renal disease: Secondary | ICD-10-CM | POA: Diagnosis not present

## 2018-12-09 DIAGNOSIS — D509 Iron deficiency anemia, unspecified: Secondary | ICD-10-CM | POA: Diagnosis not present

## 2018-12-09 DIAGNOSIS — N2581 Secondary hyperparathyroidism of renal origin: Secondary | ICD-10-CM | POA: Diagnosis not present

## 2018-12-09 DIAGNOSIS — Z992 Dependence on renal dialysis: Secondary | ICD-10-CM | POA: Diagnosis not present

## 2018-12-09 DIAGNOSIS — N186 End stage renal disease: Secondary | ICD-10-CM | POA: Diagnosis not present

## 2018-12-09 DIAGNOSIS — D631 Anemia in chronic kidney disease: Secondary | ICD-10-CM | POA: Diagnosis not present

## 2018-12-10 DIAGNOSIS — Z992 Dependence on renal dialysis: Secondary | ICD-10-CM | POA: Diagnosis not present

## 2018-12-10 DIAGNOSIS — D509 Iron deficiency anemia, unspecified: Secondary | ICD-10-CM | POA: Diagnosis not present

## 2018-12-10 DIAGNOSIS — Z01812 Encounter for preprocedural laboratory examination: Secondary | ICD-10-CM | POA: Diagnosis not present

## 2018-12-10 DIAGNOSIS — N2581 Secondary hyperparathyroidism of renal origin: Secondary | ICD-10-CM | POA: Diagnosis not present

## 2018-12-10 DIAGNOSIS — N186 End stage renal disease: Secondary | ICD-10-CM | POA: Diagnosis not present

## 2018-12-10 DIAGNOSIS — Z20828 Contact with and (suspected) exposure to other viral communicable diseases: Secondary | ICD-10-CM | POA: Diagnosis not present

## 2018-12-10 DIAGNOSIS — D631 Anemia in chronic kidney disease: Secondary | ICD-10-CM | POA: Diagnosis not present

## 2018-12-11 DIAGNOSIS — D509 Iron deficiency anemia, unspecified: Secondary | ICD-10-CM | POA: Diagnosis not present

## 2018-12-11 DIAGNOSIS — D631 Anemia in chronic kidney disease: Secondary | ICD-10-CM | POA: Diagnosis not present

## 2018-12-11 DIAGNOSIS — N186 End stage renal disease: Secondary | ICD-10-CM | POA: Diagnosis not present

## 2018-12-11 DIAGNOSIS — N2581 Secondary hyperparathyroidism of renal origin: Secondary | ICD-10-CM | POA: Diagnosis not present

## 2018-12-11 DIAGNOSIS — Z992 Dependence on renal dialysis: Secondary | ICD-10-CM | POA: Diagnosis not present

## 2018-12-12 DIAGNOSIS — N2581 Secondary hyperparathyroidism of renal origin: Secondary | ICD-10-CM | POA: Diagnosis not present

## 2018-12-12 DIAGNOSIS — Z992 Dependence on renal dialysis: Secondary | ICD-10-CM | POA: Diagnosis not present

## 2018-12-12 DIAGNOSIS — N186 End stage renal disease: Secondary | ICD-10-CM | POA: Diagnosis not present

## 2018-12-13 DIAGNOSIS — Z992 Dependence on renal dialysis: Secondary | ICD-10-CM | POA: Diagnosis not present

## 2018-12-13 DIAGNOSIS — N2581 Secondary hyperparathyroidism of renal origin: Secondary | ICD-10-CM | POA: Diagnosis not present

## 2018-12-13 DIAGNOSIS — N186 End stage renal disease: Secondary | ICD-10-CM | POA: Diagnosis not present

## 2018-12-14 DIAGNOSIS — N186 End stage renal disease: Secondary | ICD-10-CM | POA: Diagnosis not present

## 2018-12-14 DIAGNOSIS — Z992 Dependence on renal dialysis: Secondary | ICD-10-CM | POA: Diagnosis not present

## 2018-12-14 DIAGNOSIS — N2581 Secondary hyperparathyroidism of renal origin: Secondary | ICD-10-CM | POA: Diagnosis not present

## 2018-12-15 ENCOUNTER — Other Ambulatory Visit: Payer: Self-pay | Admitting: Cardiology

## 2018-12-15 DIAGNOSIS — N2581 Secondary hyperparathyroidism of renal origin: Secondary | ICD-10-CM | POA: Diagnosis not present

## 2018-12-15 DIAGNOSIS — N186 End stage renal disease: Secondary | ICD-10-CM | POA: Diagnosis not present

## 2018-12-15 DIAGNOSIS — Z992 Dependence on renal dialysis: Secondary | ICD-10-CM | POA: Diagnosis not present

## 2018-12-15 DIAGNOSIS — T8241XD Breakdown (mechanical) of vascular dialysis catheter, subsequent encounter: Secondary | ICD-10-CM | POA: Diagnosis not present

## 2018-12-16 DIAGNOSIS — Z992 Dependence on renal dialysis: Secondary | ICD-10-CM | POA: Diagnosis not present

## 2018-12-16 DIAGNOSIS — N2581 Secondary hyperparathyroidism of renal origin: Secondary | ICD-10-CM | POA: Diagnosis not present

## 2018-12-16 DIAGNOSIS — N186 End stage renal disease: Secondary | ICD-10-CM | POA: Diagnosis not present

## 2018-12-17 DIAGNOSIS — I440 Atrioventricular block, first degree: Secondary | ICD-10-CM | POA: Diagnosis not present

## 2018-12-17 DIAGNOSIS — E669 Obesity, unspecified: Secondary | ICD-10-CM | POA: Diagnosis not present

## 2018-12-17 DIAGNOSIS — E1122 Type 2 diabetes mellitus with diabetic chronic kidney disease: Secondary | ICD-10-CM | POA: Diagnosis not present

## 2018-12-17 DIAGNOSIS — M793 Panniculitis, unspecified: Secondary | ICD-10-CM | POA: Diagnosis not present

## 2018-12-17 DIAGNOSIS — C73 Malignant neoplasm of thyroid gland: Secondary | ICD-10-CM | POA: Diagnosis not present

## 2018-12-17 DIAGNOSIS — N186 End stage renal disease: Secondary | ICD-10-CM | POA: Diagnosis not present

## 2018-12-17 DIAGNOSIS — M6208 Separation of muscle (nontraumatic), other site: Secondary | ICD-10-CM | POA: Diagnosis not present

## 2018-12-17 DIAGNOSIS — Z992 Dependence on renal dialysis: Secondary | ICD-10-CM | POA: Diagnosis not present

## 2018-12-17 DIAGNOSIS — I120 Hypertensive chronic kidney disease with stage 5 chronic kidney disease or end stage renal disease: Secondary | ICD-10-CM | POA: Diagnosis not present

## 2018-12-17 DIAGNOSIS — Z20828 Contact with and (suspected) exposure to other viral communicable diseases: Secondary | ICD-10-CM | POA: Diagnosis not present

## 2018-12-17 DIAGNOSIS — Z4902 Encounter for fitting and adjustment of peritoneal dialysis catheter: Secondary | ICD-10-CM | POA: Diagnosis not present

## 2018-12-17 DIAGNOSIS — Z794 Long term (current) use of insulin: Secondary | ICD-10-CM | POA: Diagnosis not present

## 2018-12-18 DIAGNOSIS — Z4902 Encounter for fitting and adjustment of peritoneal dialysis catheter: Secondary | ICD-10-CM | POA: Diagnosis not present

## 2018-12-18 DIAGNOSIS — C73 Malignant neoplasm of thyroid gland: Secondary | ICD-10-CM | POA: Diagnosis not present

## 2018-12-18 DIAGNOSIS — Z20828 Contact with and (suspected) exposure to other viral communicable diseases: Secondary | ICD-10-CM | POA: Diagnosis not present

## 2018-12-18 DIAGNOSIS — Z79899 Other long term (current) drug therapy: Secondary | ICD-10-CM | POA: Diagnosis not present

## 2018-12-18 DIAGNOSIS — Z794 Long term (current) use of insulin: Secondary | ICD-10-CM | POA: Diagnosis not present

## 2018-12-18 DIAGNOSIS — M793 Panniculitis, unspecified: Secondary | ICD-10-CM | POA: Diagnosis not present

## 2018-12-18 DIAGNOSIS — I440 Atrioventricular block, first degree: Secondary | ICD-10-CM | POA: Diagnosis not present

## 2018-12-18 DIAGNOSIS — E669 Obesity, unspecified: Secondary | ICD-10-CM | POA: Diagnosis not present

## 2018-12-18 DIAGNOSIS — Z9884 Bariatric surgery status: Secondary | ICD-10-CM | POA: Diagnosis not present

## 2018-12-18 DIAGNOSIS — Z992 Dependence on renal dialysis: Secondary | ICD-10-CM | POA: Diagnosis not present

## 2018-12-18 DIAGNOSIS — Z8249 Family history of ischemic heart disease and other diseases of the circulatory system: Secondary | ICD-10-CM | POA: Diagnosis not present

## 2018-12-18 DIAGNOSIS — Z905 Acquired absence of kidney: Secondary | ICD-10-CM | POA: Diagnosis not present

## 2018-12-18 DIAGNOSIS — I120 Hypertensive chronic kidney disease with stage 5 chronic kidney disease or end stage renal disease: Secondary | ICD-10-CM | POA: Diagnosis not present

## 2018-12-18 DIAGNOSIS — E1122 Type 2 diabetes mellitus with diabetic chronic kidney disease: Secondary | ICD-10-CM | POA: Diagnosis not present

## 2018-12-18 DIAGNOSIS — N186 End stage renal disease: Secondary | ICD-10-CM | POA: Diagnosis not present

## 2018-12-18 DIAGNOSIS — M6208 Separation of muscle (nontraumatic), other site: Secondary | ICD-10-CM | POA: Diagnosis not present

## 2018-12-18 DIAGNOSIS — Z85528 Personal history of other malignant neoplasm of kidney: Secondary | ICD-10-CM | POA: Diagnosis not present

## 2018-12-19 DIAGNOSIS — Z992 Dependence on renal dialysis: Secondary | ICD-10-CM | POA: Diagnosis not present

## 2018-12-19 DIAGNOSIS — M6208 Separation of muscle (nontraumatic), other site: Secondary | ICD-10-CM | POA: Diagnosis not present

## 2018-12-19 DIAGNOSIS — Z8249 Family history of ischemic heart disease and other diseases of the circulatory system: Secondary | ICD-10-CM | POA: Diagnosis not present

## 2018-12-19 DIAGNOSIS — Z9884 Bariatric surgery status: Secondary | ICD-10-CM | POA: Diagnosis not present

## 2018-12-19 DIAGNOSIS — Z905 Acquired absence of kidney: Secondary | ICD-10-CM | POA: Diagnosis not present

## 2018-12-19 DIAGNOSIS — Z85528 Personal history of other malignant neoplasm of kidney: Secondary | ICD-10-CM | POA: Diagnosis not present

## 2018-12-19 DIAGNOSIS — Z20828 Contact with and (suspected) exposure to other viral communicable diseases: Secondary | ICD-10-CM | POA: Diagnosis not present

## 2018-12-19 DIAGNOSIS — N186 End stage renal disease: Secondary | ICD-10-CM | POA: Diagnosis not present

## 2018-12-19 DIAGNOSIS — E669 Obesity, unspecified: Secondary | ICD-10-CM | POA: Diagnosis not present

## 2018-12-19 DIAGNOSIS — Z794 Long term (current) use of insulin: Secondary | ICD-10-CM | POA: Diagnosis not present

## 2018-12-19 DIAGNOSIS — Z4902 Encounter for fitting and adjustment of peritoneal dialysis catheter: Secondary | ICD-10-CM | POA: Diagnosis not present

## 2018-12-19 DIAGNOSIS — E1122 Type 2 diabetes mellitus with diabetic chronic kidney disease: Secondary | ICD-10-CM | POA: Diagnosis not present

## 2018-12-19 DIAGNOSIS — M793 Panniculitis, unspecified: Secondary | ICD-10-CM | POA: Diagnosis not present

## 2018-12-19 DIAGNOSIS — C73 Malignant neoplasm of thyroid gland: Secondary | ICD-10-CM | POA: Diagnosis not present

## 2018-12-19 DIAGNOSIS — I440 Atrioventricular block, first degree: Secondary | ICD-10-CM | POA: Diagnosis not present

## 2018-12-19 DIAGNOSIS — Z79899 Other long term (current) drug therapy: Secondary | ICD-10-CM | POA: Diagnosis not present

## 2018-12-19 DIAGNOSIS — I120 Hypertensive chronic kidney disease with stage 5 chronic kidney disease or end stage renal disease: Secondary | ICD-10-CM | POA: Diagnosis not present

## 2018-12-20 DIAGNOSIS — Z992 Dependence on renal dialysis: Secondary | ICD-10-CM | POA: Diagnosis not present

## 2018-12-20 DIAGNOSIS — N186 End stage renal disease: Secondary | ICD-10-CM | POA: Diagnosis not present

## 2018-12-21 DIAGNOSIS — N186 End stage renal disease: Secondary | ICD-10-CM | POA: Diagnosis not present

## 2018-12-21 DIAGNOSIS — Z992 Dependence on renal dialysis: Secondary | ICD-10-CM | POA: Diagnosis not present

## 2018-12-22 DIAGNOSIS — Z992 Dependence on renal dialysis: Secondary | ICD-10-CM | POA: Diagnosis not present

## 2018-12-22 DIAGNOSIS — N186 End stage renal disease: Secondary | ICD-10-CM | POA: Diagnosis not present

## 2018-12-22 DIAGNOSIS — Z1159 Encounter for screening for other viral diseases: Secondary | ICD-10-CM | POA: Diagnosis not present

## 2018-12-22 DIAGNOSIS — I259 Chronic ischemic heart disease, unspecified: Secondary | ICD-10-CM | POA: Diagnosis not present

## 2018-12-22 DIAGNOSIS — E119 Type 2 diabetes mellitus without complications: Secondary | ICD-10-CM | POA: Diagnosis not present

## 2018-12-22 DIAGNOSIS — Z23 Encounter for immunization: Secondary | ICD-10-CM | POA: Diagnosis not present

## 2018-12-23 ENCOUNTER — Telehealth (HOSPITAL_COMMUNITY): Payer: Self-pay | Admitting: *Deleted

## 2018-12-23 ENCOUNTER — Other Ambulatory Visit (HOSPITAL_COMMUNITY): Payer: Self-pay | Admitting: Psychiatry

## 2018-12-23 MED ORDER — TEMAZEPAM 30 MG PO CAPS: 30.0000 mg | ORAL_CAPSULE | Freq: Every evening | ORAL | 2 refills | Status: DC | PRN

## 2018-12-23 MED ORDER — ALPRAZOLAM 1 MG PO TABS: 1.0000 mg | ORAL_TABLET | Freq: Four times a day (QID) | ORAL | 2 refills | Status: DC

## 2018-12-23 NOTE — Telephone Encounter (Signed)
sent 

## 2018-12-23 NOTE — Telephone Encounter (Signed)
Patient failed to call for refills thinking [employer] does Auto refill. Patient spoke with [facility] & it will be  7-14 days before she gets her Xanax.  Patient is requesting that she get a 7-14 day supply of her Xanax (since she is completely out)  & also refill on the Restoril .  Please send to [facility], Next Appt is 01/01/2019

## 2018-12-23 NOTE — Telephone Encounter (Signed)
Please send both med's Xanax & also  the Restoril . Please send to [facility],

## 2018-12-23 NOTE — Telephone Encounter (Signed)
Where does she want the restoril sent?

## 2018-12-24 DIAGNOSIS — Z23 Encounter for immunization: Secondary | ICD-10-CM | POA: Diagnosis not present

## 2018-12-24 DIAGNOSIS — Z992 Dependence on renal dialysis: Secondary | ICD-10-CM | POA: Diagnosis not present

## 2018-12-24 DIAGNOSIS — N186 End stage renal disease: Secondary | ICD-10-CM | POA: Diagnosis not present

## 2018-12-26 DIAGNOSIS — Z23 Encounter for immunization: Secondary | ICD-10-CM | POA: Diagnosis not present

## 2018-12-26 DIAGNOSIS — I259 Chronic ischemic heart disease, unspecified: Secondary | ICD-10-CM | POA: Diagnosis not present

## 2018-12-26 DIAGNOSIS — Z992 Dependence on renal dialysis: Secondary | ICD-10-CM | POA: Diagnosis not present

## 2018-12-26 DIAGNOSIS — N186 End stage renal disease: Secondary | ICD-10-CM | POA: Diagnosis not present

## 2018-12-26 DIAGNOSIS — E119 Type 2 diabetes mellitus without complications: Secondary | ICD-10-CM | POA: Diagnosis not present

## 2018-12-29 DIAGNOSIS — Z23 Encounter for immunization: Secondary | ICD-10-CM | POA: Diagnosis not present

## 2018-12-29 DIAGNOSIS — I259 Chronic ischemic heart disease, unspecified: Secondary | ICD-10-CM | POA: Diagnosis not present

## 2018-12-29 DIAGNOSIS — N186 End stage renal disease: Secondary | ICD-10-CM | POA: Diagnosis not present

## 2018-12-29 DIAGNOSIS — Z992 Dependence on renal dialysis: Secondary | ICD-10-CM | POA: Diagnosis not present

## 2018-12-30 DIAGNOSIS — Z992 Dependence on renal dialysis: Secondary | ICD-10-CM | POA: Diagnosis not present

## 2018-12-30 DIAGNOSIS — I259 Chronic ischemic heart disease, unspecified: Secondary | ICD-10-CM | POA: Diagnosis not present

## 2018-12-31 DIAGNOSIS — Z23 Encounter for immunization: Secondary | ICD-10-CM | POA: Diagnosis not present

## 2018-12-31 DIAGNOSIS — Z992 Dependence on renal dialysis: Secondary | ICD-10-CM | POA: Diagnosis not present

## 2018-12-31 DIAGNOSIS — N186 End stage renal disease: Secondary | ICD-10-CM | POA: Diagnosis not present

## 2019-01-01 ENCOUNTER — Ambulatory Visit (INDEPENDENT_AMBULATORY_CARE_PROVIDER_SITE_OTHER): Payer: Medicare PPO | Admitting: Psychiatry

## 2019-01-01 ENCOUNTER — Other Ambulatory Visit: Payer: Self-pay

## 2019-01-01 ENCOUNTER — Encounter (HOSPITAL_COMMUNITY): Payer: Self-pay | Admitting: Psychiatry

## 2019-01-01 DIAGNOSIS — F411 Generalized anxiety disorder: Secondary | ICD-10-CM | POA: Diagnosis not present

## 2019-01-01 DIAGNOSIS — F322 Major depressive disorder, single episode, severe without psychotic features: Secondary | ICD-10-CM

## 2019-01-01 MED ORDER — TEMAZEPAM 30 MG PO CAPS: 30.0000 mg | ORAL_CAPSULE | Freq: Every evening | ORAL | 2 refills | Status: DC | PRN

## 2019-01-01 MED ORDER — PRAZOSIN HCL 5 MG PO CAPS: 5.0000 mg | ORAL_CAPSULE | Freq: Every day | ORAL | 2 refills | Status: DC

## 2019-01-01 MED ORDER — BUPROPION HCL ER (XL) 300 MG PO TB24: 300.0000 mg | ORAL_TABLET | ORAL | 2 refills | Status: DC

## 2019-01-01 MED ORDER — ALPRAZOLAM 1 MG PO TABS: 1.0000 mg | ORAL_TABLET | Freq: Four times a day (QID) | ORAL | 2 refills | Status: DC

## 2019-01-01 NOTE — Progress Notes (Signed)
Virtual Visit via Video Note  I connected with April Ward on 01/01/19 at  8:20 AM EDT by a video enabled telemedicine application and verified that I am speaking with the correct person using two identifiers.   I discussed the limitations of evaluation and management by telemedicine and the availability of in person appointments. The patient expressed understanding and agreed to proceed.      I discussed the assessment and treatment plan with the patient. The patient was provided an opportunity to ask questions and all were answered. The patient agreed with the plan and demonstrated an understanding of the instructions.   The patient was advised to call back or seek an in-person evaluation if the symptoms worsen or if the condition fails to improve as anticipated.  I provided 63minutes of non-face-to-face time during this encounter.   [name], MD  [facility] MD/PA/NP OP Progress Note  01/01/2019 8:56 AM April Ward  MRN:  [id number]  Chief Complaint:  Chief Complaint    Depression; Anxiety; Follow-up     HPI: This patient is a 58 year old widowed black female who lives with her son in [place].  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  She had a panniculectomy about 2 weeks ago and is recovering nicely.  She decided to have it because she lost a lot of weight from bariatric surgery and had a lot of excessive skin that was causing rashes and irritation.  She states that she is healing well but cannot be as active yet.  She is also had to go to hemodialysis because she cannot do the peritoneal dialysis for another couple of weeks.  She states that since the surgery she has been having more nightmares but I am reluctant to increase the prazosin as it is already at 5 mg and I do not want her blood pressure to drop.  Overall however her mood is good and she is looking forward to getting more  active doing her walking and getting back into gardening.  Her mood seems fairly bright and she denies suicidal ideation.  She still feels that her medications are very helpful. Visit Diagnosis:    ICD-10-CM   1. Major depressive disorder, single episode, severe without psychotic features ([place])  F32.2   2. GAD (generalized anxiety disorder)  F41.1     Past Psychiatric History: none  Past Medical History:  Past Medical History:  Diagnosis Date  . Anxiety   . Arthritis   . Depression   . Diabetes mellitus without complication ([place])   . GERD (gastroesophageal reflux disease)   . Gout   . Heart murmur   . Hypertension   . Renal cancer ([place])   . Renal disease 10/2012  . Renal insufficiency     Past Surgical History:  Procedure Laterality Date  . CESAREAN SECTION  [id number]  . CHONDROPLASTY Right 08/22/2012   Procedure: CHONDROPLASTY;  Surgeon: Carole Civil, MD;  Location: [facility];  Service: Orthopedics;  Laterality: Right;  . COLONOSCOPY WITH PROPOFOL N/A 03/16/2014   Procedure: COLONOSCOPY WITH PROPOFOL (at cecum 1023, total withdrawal time=9 minutes);  Surgeon: Danie Binder, MD;  Location: [facility];  Service: Endoscopy;  Laterality: N/A;  . GASTRIC BYPASS    . KNEE ARTHROSCOPY WITH MEDIAL MENISECTOMY Right 08/22/2012   Procedure: KNEE ARTHROSCOPY WITH PARTIAL MEDIAL MENISECTOMY;  Surgeon: Carole Civil, MD;  Location: [facility];  Service: Orthopedics;  Laterality: Right;  . PARTIAL NEPHRECTOMY  Dec 2014   left  . TENDON REPAIR      Family Psychiatric History: see below  Family History:  Family History  Problem Relation Age of Onset  . Heart attack Mother   . Heart attack Father   . Depression Paternal Aunt   . Alcohol abuse Maternal Uncle   . Colon cancer Maternal Aunt   . Colon cancer Maternal Uncle     Social History:  Social History   Socioeconomic History  . Marital status: Widowed    Spouse name: Not on file  . Number of children: Not on file  . Years  of education: Not on file  . Highest education level: Not on file  Occupational History  . Occupation: Visual merchandiser: [employer]  Social Needs  . Financial resource strain: Not on file  . Food insecurity    Worry: Not on file    Inability: Not on file  . Transportation needs    Medical: Not on file    Non-medical: Not on file  Tobacco Use  . Smoking status: Never Smoker  . Smokeless tobacco: Never Used  Substance and Sexual Activity  . Alcohol use: No    Alcohol/week: 0.0 standard drinks  . Drug use: No  . Sexual activity: Not on file  Lifestyle  . Physical activity    Days per week: Not on file    Minutes per session: Not on file  . Stress: Not on file  Relationships  . Social Herbalist on phone: Not on file    Gets together: Not on file    Attends religious service: Not on file    Active member of club or organization: Not on file    Attends meetings of clubs or organizations: Not on file    Relationship status: Not on file  Other Topics Concern  . Not on file  Social History Narrative  . Not on file    Allergies:  Allergies  Allergen Reactions  . Skelaxin [Metaxalone] Hives and Rash    Metabolic Disorder Labs: No results found for: HGBA1C, MPG No results found for: PROLACTIN Lab Results  Component Value Date   CHOL 215 (H) 03/11/2014   TRIG 288 (H) 03/11/2014   HDL 33 (L) 03/11/2014   CHOLHDL 6.5 03/11/2014   VLDL 58 (H) 03/11/2014   LDLCALC 124 (H) 03/11/2014   No results found for: TSH  Therapeutic Level Labs: No results found for: LITHIUM No results found for: VALPROATE No components found for:  CBMZ  Current Medications: Current Outpatient Medications  Medication Sig Dispense Refill  . ALPRAZolam (XANAX) 1 MG tablet Take 1 tablet (1 mg total) by mouth 4 (four) times daily. 120 tablet 2  . amLODipine (NORVASC) 10 MG tablet Take 1 tablet by mouth daily.    Marland Kitchen buPROPion (WELLBUTRIN XL) 300 MG 24 hr tablet  Take 1 tablet (300 mg total) by mouth every morning. 90 tablet 2  . calcitRIOL (ROCALTROL) 0.25 MCG capsule Take 1 capsule by mouth 3 (three) times a week. M-W-F    . hydrALAZINE (APRESOLINE) 100 MG tablet TAKE 1 TABLET THREE TIMES DAILY 270 tablet 1  . lactulose (CONSTULOSE) 10 GM/15ML solution Take 10 mLs by mouth 2 (two) times daily as needed for constipation.    Marland Kitchen levothyroxine (SYNTHROID, LEVOTHROID) 200 MCG tablet Take 1 tablet by mouth daily.    Marland Kitchen  losartan (COZAAR) 50 MG tablet Take 1 tablet (50 mg total) by mouth daily. 30 tablet 0  . meclizine (ANTIVERT) 25 MG tablet Take 1 tablet (25 mg total) by mouth every 8 (eight) hours as needed for dizziness. 90 tablet 0  . multivitamin (RENA-VIT) TABS tablet Take 1 tablet by mouth daily.    . prazosin (MINIPRESS) 5 MG capsule Take 1 capsule (5 mg total) by mouth at bedtime. 90 capsule 2  . sevelamer carbonate (RENVELA) 800 MG tablet Take 800 mg by mouth daily.     . temazepam (RESTORIL) 30 MG capsule Take 1 capsule (30 mg total) by mouth at bedtime as needed. for sleep 30 capsule 2  . vitamin B-12 (CYANOCOBALAMIN) 500 MCG tablet Take 500 mcg by mouth daily.     No current facility-administered medications for this visit.      Musculoskeletal: Strength & Muscle Tone: within normal limits Gait & Station: normal Patient leans: N/A  Psychiatric Specialty Exam: Review of Systems  All other systems reviewed and are negative.   There were no vitals taken for this visit.There is no height or weight on file to calculate BMI.  General Appearance: Casual and Fairly Groomed  Eye Contact:  Good  Speech:  Clear and Coherent  Volume:  Normal  Mood:  Euthymic  Affect:  Appropriate and Congruent  Thought Process:  Goal Directed  Orientation:  Full (Time, Place, and Person)  Thought Content: WDL   Suicidal Thoughts:  No  Homicidal Thoughts:  No  Memory:  Immediate;   Good Recent;   Good Remote;   Good  Judgement:  Good  Insight:  Fair   Psychomotor Activity:  Normal  Concentration:  Concentration: Good and Attention Span: Good  Recall:  Good  Fund of Knowledge: Good  Language: Good  Akathisia:  No  Handed:  Right  AIMS (if indicated): not done  Assets:  Communication Skills Desire for Improvement Resilience Social Support Talents/Skills  ADL's:  Intact  Cognition: WNL  Sleep:  Fair   Screenings:   Assessment and Plan: This patient is a 58 year old female with a history of end-stage renal disease, depression and anxiety.  She is doing fairly well since her recent surgery.  She will continue Restoril 30 mg at bedtime for sleep, prazosin 5 mg for nightmares, Wellbutrin XL 300 mg every morning for depression and Xanax 1 mg 4 times daily for anxiety.  She will return to see me in 3 months   [name], MD 01/01/2019, 8:56 AM

## 2019-01-02 DIAGNOSIS — Z992 Dependence on renal dialysis: Secondary | ICD-10-CM | POA: Diagnosis not present

## 2019-01-02 DIAGNOSIS — N186 End stage renal disease: Secondary | ICD-10-CM | POA: Diagnosis not present

## 2019-01-05 DIAGNOSIS — N186 End stage renal disease: Secondary | ICD-10-CM | POA: Diagnosis not present

## 2019-01-05 DIAGNOSIS — E119 Type 2 diabetes mellitus without complications: Secondary | ICD-10-CM | POA: Diagnosis not present

## 2019-01-05 DIAGNOSIS — Z992 Dependence on renal dialysis: Secondary | ICD-10-CM | POA: Diagnosis not present

## 2019-01-07 DIAGNOSIS — N186 End stage renal disease: Secondary | ICD-10-CM | POA: Diagnosis not present

## 2019-01-07 DIAGNOSIS — Z992 Dependence on renal dialysis: Secondary | ICD-10-CM | POA: Diagnosis not present

## 2019-01-09 DIAGNOSIS — Z992 Dependence on renal dialysis: Secondary | ICD-10-CM | POA: Diagnosis not present

## 2019-01-09 DIAGNOSIS — N186 End stage renal disease: Secondary | ICD-10-CM | POA: Diagnosis not present

## 2019-01-12 DIAGNOSIS — N186 End stage renal disease: Secondary | ICD-10-CM | POA: Diagnosis not present

## 2019-01-12 DIAGNOSIS — Z992 Dependence on renal dialysis: Secondary | ICD-10-CM | POA: Diagnosis not present

## 2019-01-14 DIAGNOSIS — Z992 Dependence on renal dialysis: Secondary | ICD-10-CM | POA: Diagnosis not present

## 2019-01-14 DIAGNOSIS — N186 End stage renal disease: Secondary | ICD-10-CM | POA: Diagnosis not present

## 2019-01-16 DIAGNOSIS — Z992 Dependence on renal dialysis: Secondary | ICD-10-CM | POA: Diagnosis not present

## 2019-01-16 DIAGNOSIS — N186 End stage renal disease: Secondary | ICD-10-CM | POA: Diagnosis not present

## 2019-01-19 DIAGNOSIS — Z992 Dependence on renal dialysis: Secondary | ICD-10-CM | POA: Diagnosis not present

## 2019-01-19 DIAGNOSIS — N186 End stage renal disease: Secondary | ICD-10-CM | POA: Diagnosis not present

## 2019-01-21 DIAGNOSIS — N186 End stage renal disease: Secondary | ICD-10-CM | POA: Diagnosis not present

## 2019-01-21 DIAGNOSIS — Z992 Dependence on renal dialysis: Secondary | ICD-10-CM | POA: Diagnosis not present

## 2019-01-23 DIAGNOSIS — Z992 Dependence on renal dialysis: Secondary | ICD-10-CM | POA: Diagnosis not present

## 2019-01-23 DIAGNOSIS — N186 End stage renal disease: Secondary | ICD-10-CM | POA: Diagnosis not present

## 2019-01-26 DIAGNOSIS — N186 End stage renal disease: Secondary | ICD-10-CM | POA: Diagnosis not present

## 2019-01-26 DIAGNOSIS — Z992 Dependence on renal dialysis: Secondary | ICD-10-CM | POA: Diagnosis not present

## 2019-01-28 DIAGNOSIS — N186 End stage renal disease: Secondary | ICD-10-CM | POA: Diagnosis not present

## 2019-01-28 DIAGNOSIS — Z992 Dependence on renal dialysis: Secondary | ICD-10-CM | POA: Diagnosis not present

## 2019-01-30 DIAGNOSIS — N186 End stage renal disease: Secondary | ICD-10-CM | POA: Diagnosis not present

## 2019-01-30 DIAGNOSIS — Z992 Dependence on renal dialysis: Secondary | ICD-10-CM | POA: Diagnosis not present

## 2019-02-02 DIAGNOSIS — N186 End stage renal disease: Secondary | ICD-10-CM | POA: Diagnosis not present

## 2019-02-02 DIAGNOSIS — Z992 Dependence on renal dialysis: Secondary | ICD-10-CM | POA: Diagnosis not present

## 2019-02-03 DIAGNOSIS — Z01812 Encounter for preprocedural laboratory examination: Secondary | ICD-10-CM | POA: Diagnosis not present

## 2019-02-03 DIAGNOSIS — N186 End stage renal disease: Secondary | ICD-10-CM | POA: Diagnosis not present

## 2019-02-03 DIAGNOSIS — Z20828 Contact with and (suspected) exposure to other viral communicable diseases: Secondary | ICD-10-CM | POA: Diagnosis not present

## 2019-02-04 DIAGNOSIS — Z992 Dependence on renal dialysis: Secondary | ICD-10-CM | POA: Diagnosis not present

## 2019-02-04 DIAGNOSIS — N186 End stage renal disease: Secondary | ICD-10-CM | POA: Diagnosis not present

## 2019-02-06 DIAGNOSIS — Z992 Dependence on renal dialysis: Secondary | ICD-10-CM | POA: Diagnosis not present

## 2019-02-06 DIAGNOSIS — N186 End stage renal disease: Secondary | ICD-10-CM | POA: Diagnosis not present

## 2019-02-09 DIAGNOSIS — Z992 Dependence on renal dialysis: Secondary | ICD-10-CM | POA: Diagnosis not present

## 2019-02-09 DIAGNOSIS — N186 End stage renal disease: Secondary | ICD-10-CM | POA: Diagnosis not present

## 2019-02-10 DIAGNOSIS — Z4901 Encounter for fitting and adjustment of extracorporeal dialysis catheter: Secondary | ICD-10-CM | POA: Diagnosis not present

## 2019-02-10 DIAGNOSIS — K660 Peritoneal adhesions (postprocedural) (postinfection): Secondary | ICD-10-CM | POA: Diagnosis not present

## 2019-02-10 DIAGNOSIS — N186 End stage renal disease: Secondary | ICD-10-CM | POA: Diagnosis not present

## 2019-02-10 DIAGNOSIS — Z992 Dependence on renal dialysis: Secondary | ICD-10-CM | POA: Diagnosis not present

## 2019-02-10 DIAGNOSIS — I120 Hypertensive chronic kidney disease with stage 5 chronic kidney disease or end stage renal disease: Secondary | ICD-10-CM | POA: Diagnosis not present

## 2019-02-10 DIAGNOSIS — E1322 Other specified diabetes mellitus with diabetic chronic kidney disease: Secondary | ICD-10-CM | POA: Diagnosis not present

## 2019-02-11 DIAGNOSIS — Z992 Dependence on renal dialysis: Secondary | ICD-10-CM | POA: Diagnosis not present

## 2019-02-11 DIAGNOSIS — N186 End stage renal disease: Secondary | ICD-10-CM | POA: Diagnosis not present

## 2019-02-13 DIAGNOSIS — N186 End stage renal disease: Secondary | ICD-10-CM | POA: Diagnosis not present

## 2019-02-13 DIAGNOSIS — Z992 Dependence on renal dialysis: Secondary | ICD-10-CM | POA: Diagnosis not present

## 2019-02-16 DIAGNOSIS — Z992 Dependence on renal dialysis: Secondary | ICD-10-CM | POA: Diagnosis not present

## 2019-02-16 DIAGNOSIS — E890 Postprocedural hypothyroidism: Secondary | ICD-10-CM | POA: Diagnosis not present

## 2019-02-16 DIAGNOSIS — N186 End stage renal disease: Secondary | ICD-10-CM | POA: Diagnosis not present

## 2019-02-16 DIAGNOSIS — C73 Malignant neoplasm of thyroid gland: Secondary | ICD-10-CM | POA: Diagnosis not present

## 2019-02-18 DIAGNOSIS — Z992 Dependence on renal dialysis: Secondary | ICD-10-CM | POA: Diagnosis not present

## 2019-02-18 DIAGNOSIS — N186 End stage renal disease: Secondary | ICD-10-CM | POA: Diagnosis not present

## 2019-02-20 DIAGNOSIS — N186 End stage renal disease: Secondary | ICD-10-CM | POA: Diagnosis not present

## 2019-02-20 DIAGNOSIS — Z992 Dependence on renal dialysis: Secondary | ICD-10-CM | POA: Diagnosis not present

## 2019-02-23 DIAGNOSIS — N186 End stage renal disease: Secondary | ICD-10-CM | POA: Diagnosis not present

## 2019-02-23 DIAGNOSIS — Z992 Dependence on renal dialysis: Secondary | ICD-10-CM | POA: Diagnosis not present

## 2019-02-25 DIAGNOSIS — Z992 Dependence on renal dialysis: Secondary | ICD-10-CM | POA: Diagnosis not present

## 2019-02-25 DIAGNOSIS — N186 End stage renal disease: Secondary | ICD-10-CM | POA: Diagnosis not present

## 2019-02-27 DIAGNOSIS — Z992 Dependence on renal dialysis: Secondary | ICD-10-CM | POA: Diagnosis not present

## 2019-02-27 DIAGNOSIS — N186 End stage renal disease: Secondary | ICD-10-CM | POA: Diagnosis not present

## 2019-03-02 DIAGNOSIS — Z992 Dependence on renal dialysis: Secondary | ICD-10-CM | POA: Diagnosis not present

## 2019-03-02 DIAGNOSIS — N186 End stage renal disease: Secondary | ICD-10-CM | POA: Diagnosis not present

## 2019-03-02 DIAGNOSIS — Z20828 Contact with and (suspected) exposure to other viral communicable diseases: Secondary | ICD-10-CM | POA: Diagnosis not present

## 2019-03-03 DIAGNOSIS — Z01419 Encounter for gynecological examination (general) (routine) without abnormal findings: Secondary | ICD-10-CM | POA: Diagnosis not present

## 2019-03-03 DIAGNOSIS — F332 Major depressive disorder, recurrent severe without psychotic features: Secondary | ICD-10-CM | POA: Diagnosis not present

## 2019-03-03 DIAGNOSIS — Z6829 Body mass index (BMI) 29.0-29.9, adult: Secondary | ICD-10-CM | POA: Diagnosis not present

## 2019-03-04 DIAGNOSIS — N186 End stage renal disease: Secondary | ICD-10-CM | POA: Diagnosis not present

## 2019-03-04 DIAGNOSIS — Z992 Dependence on renal dialysis: Secondary | ICD-10-CM | POA: Diagnosis not present

## 2019-03-06 DIAGNOSIS — N186 End stage renal disease: Secondary | ICD-10-CM | POA: Diagnosis not present

## 2019-03-06 DIAGNOSIS — Z992 Dependence on renal dialysis: Secondary | ICD-10-CM | POA: Diagnosis not present

## 2019-03-09 DIAGNOSIS — N186 End stage renal disease: Secondary | ICD-10-CM | POA: Diagnosis not present

## 2019-03-09 DIAGNOSIS — Z4902 Encounter for fitting and adjustment of peritoneal dialysis catheter: Secondary | ICD-10-CM | POA: Diagnosis not present

## 2019-03-10 DIAGNOSIS — N186 End stage renal disease: Secondary | ICD-10-CM | POA: Diagnosis not present

## 2019-03-10 DIAGNOSIS — Z7682 Awaiting organ transplant status: Secondary | ICD-10-CM | POA: Diagnosis not present

## 2019-03-11 DIAGNOSIS — N186 End stage renal disease: Secondary | ICD-10-CM | POA: Diagnosis not present

## 2019-03-12 DIAGNOSIS — N186 End stage renal disease: Secondary | ICD-10-CM | POA: Diagnosis not present

## 2019-03-13 DIAGNOSIS — D631 Anemia in chronic kidney disease: Secondary | ICD-10-CM | POA: Diagnosis not present

## 2019-03-13 DIAGNOSIS — N186 End stage renal disease: Secondary | ICD-10-CM | POA: Diagnosis not present

## 2019-03-13 DIAGNOSIS — N2581 Secondary hyperparathyroidism of renal origin: Secondary | ICD-10-CM | POA: Diagnosis not present

## 2019-03-14 DIAGNOSIS — N2581 Secondary hyperparathyroidism of renal origin: Secondary | ICD-10-CM | POA: Diagnosis not present

## 2019-03-14 DIAGNOSIS — D631 Anemia in chronic kidney disease: Secondary | ICD-10-CM | POA: Diagnosis not present

## 2019-03-14 DIAGNOSIS — N186 End stage renal disease: Secondary | ICD-10-CM | POA: Diagnosis not present

## 2019-03-15 ENCOUNTER — Other Ambulatory Visit (HOSPITAL_COMMUNITY): Payer: Self-pay | Admitting: Psychiatry

## 2019-03-15 DIAGNOSIS — N186 End stage renal disease: Secondary | ICD-10-CM | POA: Diagnosis not present

## 2019-03-15 DIAGNOSIS — D631 Anemia in chronic kidney disease: Secondary | ICD-10-CM | POA: Diagnosis not present

## 2019-03-15 DIAGNOSIS — N2581 Secondary hyperparathyroidism of renal origin: Secondary | ICD-10-CM | POA: Diagnosis not present

## 2019-03-16 DIAGNOSIS — N2581 Secondary hyperparathyroidism of renal origin: Secondary | ICD-10-CM | POA: Diagnosis not present

## 2019-03-16 DIAGNOSIS — R591 Generalized enlarged lymph nodes: Secondary | ICD-10-CM | POA: Diagnosis not present

## 2019-03-16 DIAGNOSIS — Z4901 Encounter for fitting and adjustment of extracorporeal dialysis catheter: Secondary | ICD-10-CM | POA: Diagnosis not present

## 2019-03-16 DIAGNOSIS — C73 Malignant neoplasm of thyroid gland: Secondary | ICD-10-CM | POA: Diagnosis not present

## 2019-03-16 DIAGNOSIS — N186 End stage renal disease: Secondary | ICD-10-CM | POA: Diagnosis not present

## 2019-03-16 DIAGNOSIS — Z85850 Personal history of malignant neoplasm of thyroid: Secondary | ICD-10-CM | POA: Diagnosis not present

## 2019-03-16 DIAGNOSIS — Z4932 Encounter for adequacy testing for peritoneal dialysis: Secondary | ICD-10-CM | POA: Diagnosis not present

## 2019-03-16 DIAGNOSIS — T8241XD Breakdown (mechanical) of vascular dialysis catheter, subsequent encounter: Secondary | ICD-10-CM | POA: Diagnosis not present

## 2019-03-16 DIAGNOSIS — D631 Anemia in chronic kidney disease: Secondary | ICD-10-CM | POA: Diagnosis not present

## 2019-03-16 DIAGNOSIS — Z7682 Awaiting organ transplant status: Secondary | ICD-10-CM | POA: Diagnosis not present

## 2019-03-17 DIAGNOSIS — N186 End stage renal disease: Secondary | ICD-10-CM | POA: Diagnosis not present

## 2019-03-17 DIAGNOSIS — N2581 Secondary hyperparathyroidism of renal origin: Secondary | ICD-10-CM | POA: Diagnosis not present

## 2019-03-17 DIAGNOSIS — D631 Anemia in chronic kidney disease: Secondary | ICD-10-CM | POA: Diagnosis not present

## 2019-03-18 DIAGNOSIS — N186 End stage renal disease: Secondary | ICD-10-CM | POA: Diagnosis not present

## 2019-03-18 DIAGNOSIS — D631 Anemia in chronic kidney disease: Secondary | ICD-10-CM | POA: Diagnosis not present

## 2019-03-18 DIAGNOSIS — N2581 Secondary hyperparathyroidism of renal origin: Secondary | ICD-10-CM | POA: Diagnosis not present

## 2019-03-19 DIAGNOSIS — D631 Anemia in chronic kidney disease: Secondary | ICD-10-CM | POA: Diagnosis not present

## 2019-03-19 DIAGNOSIS — N2581 Secondary hyperparathyroidism of renal origin: Secondary | ICD-10-CM | POA: Diagnosis not present

## 2019-03-19 DIAGNOSIS — N186 End stage renal disease: Secondary | ICD-10-CM | POA: Diagnosis not present

## 2019-03-20 DIAGNOSIS — R591 Generalized enlarged lymph nodes: Secondary | ICD-10-CM | POA: Diagnosis not present

## 2019-03-20 DIAGNOSIS — Z85850 Personal history of malignant neoplasm of thyroid: Secondary | ICD-10-CM | POA: Diagnosis not present

## 2019-03-20 DIAGNOSIS — D649 Anemia, unspecified: Secondary | ICD-10-CM | POA: Diagnosis not present

## 2019-03-20 DIAGNOSIS — N186 End stage renal disease: Secondary | ICD-10-CM | POA: Diagnosis not present

## 2019-03-20 DIAGNOSIS — C73 Malignant neoplasm of thyroid gland: Secondary | ICD-10-CM | POA: Diagnosis not present

## 2019-03-20 DIAGNOSIS — N2581 Secondary hyperparathyroidism of renal origin: Secondary | ICD-10-CM | POA: Diagnosis not present

## 2019-03-20 DIAGNOSIS — Z9089 Acquired absence of other organs: Secondary | ICD-10-CM | POA: Diagnosis not present

## 2019-03-20 DIAGNOSIS — Z08 Encounter for follow-up examination after completed treatment for malignant neoplasm: Secondary | ICD-10-CM | POA: Diagnosis not present

## 2019-03-20 DIAGNOSIS — D631 Anemia in chronic kidney disease: Secondary | ICD-10-CM | POA: Diagnosis not present

## 2019-03-21 DIAGNOSIS — N2581 Secondary hyperparathyroidism of renal origin: Secondary | ICD-10-CM | POA: Diagnosis not present

## 2019-03-21 DIAGNOSIS — D631 Anemia in chronic kidney disease: Secondary | ICD-10-CM | POA: Diagnosis not present

## 2019-03-21 DIAGNOSIS — N186 End stage renal disease: Secondary | ICD-10-CM | POA: Diagnosis not present

## 2019-03-22 DIAGNOSIS — N2581 Secondary hyperparathyroidism of renal origin: Secondary | ICD-10-CM | POA: Diagnosis not present

## 2019-03-22 DIAGNOSIS — D631 Anemia in chronic kidney disease: Secondary | ICD-10-CM | POA: Diagnosis not present

## 2019-03-22 DIAGNOSIS — N186 End stage renal disease: Secondary | ICD-10-CM | POA: Diagnosis not present

## 2019-03-23 DIAGNOSIS — N186 End stage renal disease: Secondary | ICD-10-CM | POA: Diagnosis not present

## 2019-03-24 DIAGNOSIS — N186 End stage renal disease: Secondary | ICD-10-CM | POA: Diagnosis not present

## 2019-03-25 DIAGNOSIS — N186 End stage renal disease: Secondary | ICD-10-CM | POA: Diagnosis not present

## 2019-03-26 DIAGNOSIS — N186 End stage renal disease: Secondary | ICD-10-CM | POA: Diagnosis not present

## 2019-03-27 DIAGNOSIS — N186 End stage renal disease: Secondary | ICD-10-CM | POA: Diagnosis not present

## 2019-03-28 DIAGNOSIS — N186 End stage renal disease: Secondary | ICD-10-CM | POA: Diagnosis not present

## 2019-03-29 DIAGNOSIS — N186 End stage renal disease: Secondary | ICD-10-CM | POA: Diagnosis not present

## 2019-03-30 DIAGNOSIS — N186 End stage renal disease: Secondary | ICD-10-CM | POA: Diagnosis not present

## 2019-03-31 DIAGNOSIS — N186 End stage renal disease: Secondary | ICD-10-CM | POA: Diagnosis not present

## 2019-04-01 DIAGNOSIS — N186 End stage renal disease: Secondary | ICD-10-CM | POA: Diagnosis not present

## 2019-04-02 DIAGNOSIS — N186 End stage renal disease: Secondary | ICD-10-CM | POA: Diagnosis not present

## 2019-04-03 DIAGNOSIS — N186 End stage renal disease: Secondary | ICD-10-CM | POA: Diagnosis not present

## 2019-04-03 DIAGNOSIS — D509 Iron deficiency anemia, unspecified: Secondary | ICD-10-CM | POA: Diagnosis not present

## 2019-04-03 DIAGNOSIS — D631 Anemia in chronic kidney disease: Secondary | ICD-10-CM | POA: Diagnosis not present

## 2019-04-03 DIAGNOSIS — N2581 Secondary hyperparathyroidism of renal origin: Secondary | ICD-10-CM | POA: Diagnosis not present

## 2019-04-04 DIAGNOSIS — D509 Iron deficiency anemia, unspecified: Secondary | ICD-10-CM | POA: Diagnosis not present

## 2019-04-04 DIAGNOSIS — D631 Anemia in chronic kidney disease: Secondary | ICD-10-CM | POA: Diagnosis not present

## 2019-04-04 DIAGNOSIS — N2581 Secondary hyperparathyroidism of renal origin: Secondary | ICD-10-CM | POA: Diagnosis not present

## 2019-04-04 DIAGNOSIS — N186 End stage renal disease: Secondary | ICD-10-CM | POA: Diagnosis not present

## 2019-04-05 DIAGNOSIS — N186 End stage renal disease: Secondary | ICD-10-CM | POA: Diagnosis not present

## 2019-04-05 DIAGNOSIS — D509 Iron deficiency anemia, unspecified: Secondary | ICD-10-CM | POA: Diagnosis not present

## 2019-04-05 DIAGNOSIS — D631 Anemia in chronic kidney disease: Secondary | ICD-10-CM | POA: Diagnosis not present

## 2019-04-05 DIAGNOSIS — N2581 Secondary hyperparathyroidism of renal origin: Secondary | ICD-10-CM | POA: Diagnosis not present

## 2019-04-06 ENCOUNTER — Ambulatory Visit (INDEPENDENT_AMBULATORY_CARE_PROVIDER_SITE_OTHER): Payer: Medicare PPO | Admitting: Psychiatry

## 2019-04-06 ENCOUNTER — Other Ambulatory Visit: Payer: Self-pay

## 2019-04-06 ENCOUNTER — Encounter (HOSPITAL_COMMUNITY): Payer: Self-pay | Admitting: Psychiatry

## 2019-04-06 DIAGNOSIS — F411 Generalized anxiety disorder: Secondary | ICD-10-CM | POA: Diagnosis not present

## 2019-04-06 DIAGNOSIS — D509 Iron deficiency anemia, unspecified: Secondary | ICD-10-CM | POA: Diagnosis not present

## 2019-04-06 DIAGNOSIS — N2581 Secondary hyperparathyroidism of renal origin: Secondary | ICD-10-CM | POA: Diagnosis not present

## 2019-04-06 DIAGNOSIS — F322 Major depressive disorder, single episode, severe without psychotic features: Secondary | ICD-10-CM

## 2019-04-06 DIAGNOSIS — D631 Anemia in chronic kidney disease: Secondary | ICD-10-CM | POA: Diagnosis not present

## 2019-04-06 DIAGNOSIS — N186 End stage renal disease: Secondary | ICD-10-CM | POA: Diagnosis not present

## 2019-04-06 MED ORDER — SERTRALINE HCL 50 MG PO TABS: 50.0000 mg | ORAL_TABLET | Freq: Every day | ORAL | 2 refills | Status: DC

## 2019-04-06 MED ORDER — PRAZOSIN HCL 5 MG PO CAPS: 5.0000 mg | ORAL_CAPSULE | Freq: Every day | ORAL | 2 refills | Status: DC

## 2019-04-06 MED ORDER — ALPRAZOLAM 1 MG PO TABS: 1.0000 mg | ORAL_TABLET | Freq: Four times a day (QID) | ORAL | 2 refills | Status: DC

## 2019-04-06 MED ORDER — TEMAZEPAM 30 MG PO CAPS: 30.0000 mg | ORAL_CAPSULE | Freq: Every evening | ORAL | 2 refills | Status: DC | PRN

## 2019-04-06 MED ORDER — BUPROPION HCL ER (XL) 300 MG PO TB24: 300.0000 mg | ORAL_TABLET | ORAL | 2 refills | Status: DC

## 2019-04-06 NOTE — Progress Notes (Signed)
Virtual Visit via Video Note  I connected with April Ward on 04/06/19 at  9:00 AM EST by a video enabled telemedicine application and verified that I am speaking with the correct person using two identifiers.   I discussed the limitations of evaluation and management by telemedicine and the availability of in person appointments. The patient expressed understanding and agreed to proceed.    I discussed the assessment and treatment plan with the patient. The patient was provided an opportunity to ask questions and all were answered. The patient agreed with the plan and demonstrated an understanding of the instructions.   The patient was advised to call back or seek an in-person evaluation if the symptoms worsen or if the condition fails to improve as anticipated.  I provided 15 minutes of non-face-to-face time during this encounter.   [name], MD  [facility] MD/PA/NP OP Progress Note  04/06/2019 9:27 AM April Ward  MRN:  [id number]  Chief Complaint:  Chief Complaint    Depression; Anxiety; Follow-up     HPI: This patient is a 59 year old widowed black female who lives with her son in [place].  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  The patient returns for follow-up after 3 months.  She states that she went through some severe episodes of depression back in November.  She told her gynecologist about it and was referred to a local therapist but she did not get in with that therapist.  Unfortunately the patient did not contact me at that time and I urged her to contact me if this comes up again.  She states she even had suicidal thoughts.  She states most of the problem is feeling that she is hemmed in because of the coronavirus pandemic.  She used to go to the gym every day.  She is going to try to find a time to go when there are very few people.  She does some exercising at home but sometimes she feels so bad she just does not go out of bed.  She is sleeping  most of the time but has been having vivid dreams.  It sounds as if she is feeling much more isolated.  I suggested that we get her back in with therapist Maurice Small whom she saw here in the past.  We can also add Zoloft to her regimen.  She states that the transplant program at [facility] does not like the idea of her being on Xanax but when she tries to go off of it she gets extremely anxious.  I suggested that we add Zoloft to help with both depression and anxiety but not to go off of Xanax at this time because she is so stressed.  I also asked her to have the transplant team contact me directly about the concerns.  The patient currently denies feeling suicidal but does seem more depressed and anxious Visit Diagnosis:    ICD-10-CM   1. Major depressive disorder, single episode, severe without psychotic features ([place])  F32.2   2. GAD (generalized anxiety disorder)  F41.1     Past Psychiatric History: Long-term outpatient treatment  Past Medical History:  Past Medical History:  Diagnosis Date  . Anxiety   . Arthritis   . Depression   . Diabetes mellitus without complication ([place])   . GERD (gastroesophageal reflux disease)   . Gout   . Heart murmur   . Hypertension   . Renal cancer ([place])   .  Renal disease 10/2012  . Renal insufficiency     Past Surgical History:  Procedure Laterality Date  . CESAREAN SECTION  [id number]  . CHONDROPLASTY Right 08/22/2012   Procedure: CHONDROPLASTY;  Surgeon: Carole Civil, MD;  Location: [facility];  Service: Orthopedics;  Laterality: Right;  . COLONOSCOPY WITH PROPOFOL N/A 03/16/2014   Procedure: COLONOSCOPY WITH PROPOFOL (at cecum 1023, total withdrawal time=9 minutes);  Surgeon: Danie Binder, MD;  Location: [facility];  Service: Endoscopy;  Laterality: N/A;  . GASTRIC BYPASS    . KNEE ARTHROSCOPY WITH MEDIAL MENISECTOMY Right 08/22/2012   Procedure: KNEE ARTHROSCOPY WITH PARTIAL MEDIAL MENISECTOMY;  Surgeon: Carole Civil, MD;  Location: [facility];   Service: Orthopedics;  Laterality: Right;  . PARTIAL NEPHRECTOMY  Dec 2014   left  . TENDON REPAIR      Family Psychiatric History: See below  Family History:  Family History  Problem Relation Age of Onset  . Heart attack Mother   . Heart attack Father   . Depression Paternal Aunt   . Alcohol abuse Maternal Uncle   . Colon cancer Maternal Aunt   . Colon cancer Maternal Uncle     Social History:  Social History   Socioeconomic History  . Marital status: Widowed    Spouse name: Not on file  . Number of children: Not on file  . Years of education: Not on file  . Highest education level: Not on file  Occupational History  . Occupation: Visual merchandiser: [employer]  Tobacco Use  . Smoking status: Never Smoker  . Smokeless tobacco: Never Used  Substance and Sexual Activity  . Alcohol use: No    Alcohol/week: 0.0 standard drinks  . Drug use: No  . Sexual activity: Not on file  Other Topics Concern  . Not on file  Social History Narrative  . Not on file   Social Determinants of Health   Financial Resource Strain:   . Difficulty of Paying Living Expenses: Not on file  Food Insecurity:   . Worried About Charity fundraiser in the Last Year: Not on file  . Ran Out of Food in the Last Year: Not on file  Transportation Needs:   . Lack of Transportation (Medical): Not on file  . Lack of Transportation (Non-Medical): Not on file  Physical Activity:   . Days of Exercise per Week: Not on file  . Minutes of Exercise per Session: Not on file  Stress:   . Feeling of Stress : Not on file  Social Connections:   . Frequency of Communication with Friends and Family: Not on file  . Frequency of Social Gatherings with Friends and Family: Not on file  . Attends Religious Services: Not on file  . Active Member of Clubs or Organizations: Not on file  . Attends Archivist Meetings: Not on file  . Marital Status: Not on file    Allergies:   Allergies  Allergen Reactions  . Skelaxin [Metaxalone] Hives and Rash    Metabolic Disorder Labs: No results found for: HGBA1C, MPG No results found for: PROLACTIN Lab Results  Component Value Date   CHOL 215 (H) 03/11/2014   TRIG 288 (H) 03/11/2014   HDL 33 (L) 03/11/2014   CHOLHDL 6.5 03/11/2014   VLDL 58 (H) 03/11/2014   LDLCALC 124 (H) 03/11/2014   No results found for: TSH  Therapeutic Level Labs: No results found for: LITHIUM No results found for: VALPROATE No  components found for:  CBMZ  Current Medications: Current Outpatient Medications  Medication Sig Dispense Refill  . ALPRAZolam (XANAX) 1 MG tablet Take 1 tablet (1 mg total) by mouth 4 (four) times daily. 60 tablet 2  . amLODipine (NORVASC) 10 MG tablet Take 1 tablet by mouth daily.    Marland Kitchen buPROPion (WELLBUTRIN XL) 300 MG 24 hr tablet Take 1 tablet (300 mg total) by mouth every morning. 90 tablet 2  . calcitRIOL (ROCALTROL) 0.25 MCG capsule Take 1 capsule by mouth 3 (three) times a week. M-W-F    . hydrALAZINE (APRESOLINE) 100 MG tablet TAKE 1 TABLET THREE TIMES DAILY 270 tablet 1  . lactulose (CONSTULOSE) 10 GM/15ML solution Take 10 mLs by mouth 2 (two) times daily as needed for constipation.    Marland Kitchen levothyroxine (SYNTHROID, LEVOTHROID) 200 MCG tablet Take 1 tablet by mouth daily.    Marland Kitchen losartan (COZAAR) 50 MG tablet Take 1 tablet (50 mg total) by mouth daily. 30 tablet 0  . meclizine (ANTIVERT) 25 MG tablet Take 1 tablet (25 mg total) by mouth every 8 (eight) hours as needed for dizziness. 90 tablet 0  . multivitamin (RENA-VIT) TABS tablet Take 1 tablet by mouth daily.    . prazosin (MINIPRESS) 5 MG capsule Take 1 capsule (5 mg total) by mouth at bedtime. 90 capsule 2  . sertraline (ZOLOFT) 50 MG tablet Take 1 tablet (50 mg total) by mouth daily. 30 tablet 2  . sevelamer carbonate (RENVELA) 800 MG tablet Take 800 mg by mouth daily.     . temazepam (RESTORIL) 30 MG capsule Take 1 capsule (30 mg total) by mouth at  bedtime as needed. for sleep 30 capsule 2  . vitamin B-12 (CYANOCOBALAMIN) 500 MCG tablet Take 500 mcg by mouth daily.     No current facility-administered medications for this visit.     Musculoskeletal: Strength & Muscle Tone: within normal limits Gait & Station: normal Patient leans: N/A  Psychiatric Specialty Exam: Review of Systems  Psychiatric/Behavioral: Positive for dysphoric mood and sleep disturbance. The patient is nervous/anxious.   All other systems reviewed and are negative.   There were no vitals taken for this visit.There is no height or weight on file to calculate BMI.  General Appearance: Casual and Fairly Groomed  Eye Contact:  Good  Speech:  Clear and Coherent  Volume:  Normal  Mood:  Anxious and Depressed  Affect:  Constricted  Thought Process:  Goal Directed  Orientation:  full  Thought Content: Rumination   Suicidal Thoughts:  No  Homicidal Thoughts:  No  Memory:  Immediate;   Good Recent;   Good Remote;   Good  Judgement:  Good  Insight:  Good  Psychomotor Activity:  Decreased  Concentration:  Concentration: Good and Attention Span: Good  Recall:  Good  Fund of Knowledge: Good  Language: Good  Akathisia:  No  Handed:  Right  AIMS (if indicated): not done  Assets:  Communication Skills Desire for Improvement Resilience Social Support Talents/Skills  ADL's:  Intact  Cognition: WNL  Sleep:  Fair   Screenings:   Assessment and Plan: This patient is a 59 year old female with a history of end-stage renal disease depression and anxiety.  She has not been doing well recently and feels very isolated and alone secondary to the coronavirus pandemic.  For this reason we will restart her in therapy with Maurice Small and add Zoloft 50 mg daily to her regimen.  She will continue Restoril 30 mg at bedtime  for sleep, prazosin 5 mg at bedtime for nightmares, Wellbutrin XL 300 mg every morning for depression and Xanax 1 mg 4 times daily for anxiety.  She will  return to see me in 3 months   [name], MD 04/06/2019, 9:27 AM

## 2019-04-07 DIAGNOSIS — N2581 Secondary hyperparathyroidism of renal origin: Secondary | ICD-10-CM | POA: Diagnosis not present

## 2019-04-07 DIAGNOSIS — N186 End stage renal disease: Secondary | ICD-10-CM | POA: Diagnosis not present

## 2019-04-07 DIAGNOSIS — D509 Iron deficiency anemia, unspecified: Secondary | ICD-10-CM | POA: Diagnosis not present

## 2019-04-07 DIAGNOSIS — D631 Anemia in chronic kidney disease: Secondary | ICD-10-CM | POA: Diagnosis not present

## 2019-04-08 DIAGNOSIS — D509 Iron deficiency anemia, unspecified: Secondary | ICD-10-CM | POA: Diagnosis not present

## 2019-04-08 DIAGNOSIS — N186 End stage renal disease: Secondary | ICD-10-CM | POA: Diagnosis not present

## 2019-04-08 DIAGNOSIS — D631 Anemia in chronic kidney disease: Secondary | ICD-10-CM | POA: Diagnosis not present

## 2019-04-08 DIAGNOSIS — N2581 Secondary hyperparathyroidism of renal origin: Secondary | ICD-10-CM | POA: Diagnosis not present

## 2019-04-09 DIAGNOSIS — N2581 Secondary hyperparathyroidism of renal origin: Secondary | ICD-10-CM | POA: Diagnosis not present

## 2019-04-09 DIAGNOSIS — N186 End stage renal disease: Secondary | ICD-10-CM | POA: Diagnosis not present

## 2019-04-09 DIAGNOSIS — D631 Anemia in chronic kidney disease: Secondary | ICD-10-CM | POA: Diagnosis not present

## 2019-04-09 DIAGNOSIS — D509 Iron deficiency anemia, unspecified: Secondary | ICD-10-CM | POA: Diagnosis not present

## 2019-04-10 ENCOUNTER — Ambulatory Visit: Payer: Medicare PPO | Admitting: Orthopedic Surgery

## 2019-04-10 ENCOUNTER — Other Ambulatory Visit: Payer: Self-pay

## 2019-04-10 ENCOUNTER — Ambulatory Visit: Payer: Medicare PPO

## 2019-04-10 VITALS — BP 136/75 | HR 65 | Temp 96.6°F | Ht 71.0 in | Wt 198.0 lb

## 2019-04-10 DIAGNOSIS — M542 Cervicalgia: Secondary | ICD-10-CM

## 2019-04-10 DIAGNOSIS — M47812 Spondylosis without myelopathy or radiculopathy, cervical region: Secondary | ICD-10-CM | POA: Diagnosis not present

## 2019-04-10 DIAGNOSIS — M488X2 Other specified spondylopathies, cervical region: Secondary | ICD-10-CM | POA: Diagnosis not present

## 2019-04-10 DIAGNOSIS — D631 Anemia in chronic kidney disease: Secondary | ICD-10-CM | POA: Diagnosis not present

## 2019-04-10 DIAGNOSIS — D509 Iron deficiency anemia, unspecified: Secondary | ICD-10-CM | POA: Diagnosis not present

## 2019-04-10 DIAGNOSIS — N186 End stage renal disease: Secondary | ICD-10-CM | POA: Diagnosis not present

## 2019-04-10 DIAGNOSIS — N2581 Secondary hyperparathyroidism of renal origin: Secondary | ICD-10-CM | POA: Diagnosis not present

## 2019-04-10 MED ORDER — MELOXICAM 7.5 MG PO TABS: 7.5000 mg | ORAL_TABLET | Freq: Every day | ORAL | 5 refills | Status: DC

## 2019-04-10 MED ORDER — TIZANIDINE HCL 4 MG PO TABS: 4.0000 mg | ORAL_TABLET | Freq: Every day | ORAL | 1 refills | Status: DC

## 2019-04-10 NOTE — Addendum Note (Signed)
Addended byCandice Camp on: 04/10/2019 10:17 AM   Modules accepted: Orders

## 2019-04-10 NOTE — Patient Instructions (Signed)
April Ward you have cervical spondylosis which is arthritis of the cervical spine.  We will institute standard treatment protocol which will include anti-inflammatory medication for pain and a muscle relaxer to help with soreness.  We do not handle neck and spine conditions so we are sending you to a neck specialist.  Please pick up the medications at the pharmacy

## 2019-04-10 NOTE — Progress Notes (Signed)
April Ward  04/10/2019  Body mass index is 27.62 kg/m.   HISTORY SECTION :  Chief Complaint  Patient presents with  . Shoulder Pain    Left shoulder pain.   59 year old female with previous history of cervical disc disease see MRI report below from 2004 presents now with neck and upper shoulder pain over the left trapezius muscle.  She says she is tried some Tylenol does not help she does note pain and stiffness when she turns her neck denies any distal symptoms of numbness tingling she does report some weakness in abduction and crepitance in left shoulder joint    Review of Systems  Constitutional: Negative for chills, fever, malaise/fatigue and weight loss.  Respiratory: Negative for shortness of breath.   Cardiovascular: Negative for chest pain.  Neurological: Negative for sensory change.     has a past medical history of Anxiety, Arthritis, Depression, Diabetes mellitus without complication ([place]), GERD (gastroesophageal reflux disease), Gout, Heart murmur, Hypertension, Renal cancer ([place]), Renal disease (10/2012), and Renal insufficiency.   Past Surgical History:  Procedure Laterality Date  . CESAREAN SECTION  [id number]  . CHONDROPLASTY Right 08/22/2012   Procedure: CHONDROPLASTY;  Surgeon: Carole Civil, MD;  Location: [facility];  Service: Orthopedics;  Laterality: Right;  . COLONOSCOPY WITH PROPOFOL N/A 03/16/2014   Procedure: COLONOSCOPY WITH PROPOFOL (at cecum 1023, total withdrawal time=9 minutes);  Surgeon: Danie Binder, MD;  Location: [facility];  Service: Endoscopy;  Laterality: N/A;  . GASTRIC BYPASS    . KNEE ARTHROSCOPY WITH MEDIAL MENISECTOMY Right 08/22/2012   Procedure: KNEE ARTHROSCOPY WITH PARTIAL MEDIAL MENISECTOMY;  Surgeon: Carole Civil, MD;  Location: [facility];  Service: Orthopedics;  Laterality: Right;  . PARTIAL NEPHRECTOMY  Dec 2014   left  . TENDON REPAIR      Body mass index is 27.62 kg/m.   Allergies  Allergen Reactions  . Skelaxin  [Metaxalone] Hives and Rash     Current Outpatient Medications:  .  ALPRAZolam (XANAX) 1 MG tablet, Take 1 tablet (1 mg total) by mouth 4 (four) times daily., Disp: 60 tablet, Rfl: 2 .  amLODipine (NORVASC) 10 MG tablet, Take 1 tablet by mouth daily., Disp: , Rfl:  .  buPROPion (WELLBUTRIN XL) 300 MG 24 hr tablet, Take 1 tablet (300 mg total) by mouth every morning., Disp: 90 tablet, Rfl: 2 .  calcitRIOL (ROCALTROL) 0.25 MCG capsule, Take 1 capsule by mouth 3 (three) times a week. M-W-F, Disp: , Rfl:  .  hydrALAZINE (APRESOLINE) 100 MG tablet, TAKE 1 TABLET THREE TIMES DAILY, Disp: 270 tablet, Rfl: 1 .  lactulose (CONSTULOSE) 10 GM/15ML solution, Take 10 mLs by mouth 2 (two) times daily as needed for constipation., Disp: , Rfl:  .  levothyroxine (SYNTHROID, LEVOTHROID) 200 MCG tablet, Take 1 tablet by mouth daily., Disp: , Rfl:  .  losartan (COZAAR) 50 MG tablet, Take 1 tablet (50 mg total) by mouth daily., Disp: 30 tablet, Rfl: 0 .  meclizine (ANTIVERT) 25 MG tablet, Take 1 tablet (25 mg total) by mouth every 8 (eight) hours as needed for dizziness., Disp: 90 tablet, Rfl: 0 .  multivitamin (RENA-VIT) TABS tablet, Take 1 tablet by mouth daily., Disp: , Rfl:  .  prazosin (MINIPRESS) 5 MG capsule, Take 1 capsule (5 mg total) by mouth at bedtime., Disp: 90 capsule, Rfl: 2 .  sertraline (ZOLOFT) 50 MG tablet, Take 1 tablet (50 mg total) by mouth daily., Disp: 30 tablet, Rfl: 2 .  sevelamer carbonate (  RENVELA) 800 MG tablet, Take 800 mg by mouth daily. , Disp: , Rfl:  .  temazepam (RESTORIL) 30 MG capsule, Take 1 capsule (30 mg total) by mouth at bedtime as needed. for sleep, Disp: 30 capsule, Rfl: 2 .  vitamin B-12 (CYANOCOBALAMIN) 500 MCG tablet, Take 500 mcg by mouth daily., Disp: , Rfl:  .  meloxicam (MOBIC) 7.5 MG tablet, Take 1 tablet (7.5 mg total) by mouth daily., Disp: 30 tablet, Rfl: 5 .  tiZANidine (ZANAFLEX) 4 MG tablet, Take 1 tablet (4 mg total) by mouth daily., Disp: 30 tablet, Rfl:  1   PHYSICAL EXAM SECTION: 1) BP 136/75   Pulse 65   Temp (!) 96.6 F (35.9 C)   Ht 5\' 11"  (1.803 m)   Wt 198 lb (89.8 kg)   BMI 27.62 kg/m   Body mass index is 27.62 kg/m.    General appearance: Well-developed well-nourished no gross deformities  2) Cardiovascular normal pulse and perfusion in the upper extremities normal color without edema  3) Neurologically deep tendon reflexes are equal and normal, no sensation loss or deficits no pathologic reflexes  4) Psychological: Awake alert and oriented x3 mood and affect normal  5) Skin no lacerations or ulcerations no nodularity no palpable masses, no erythema or nodularity  6) Musculoskeletal:   Left shoulder full range of motion mild crepitance no actual weakness in abduction.  Tenderness in the base of the cervical spine left trapezius muscle but distal neurovascular function remains intact including equal reflexes.   MEDICAL DECISION SECTION:  Encounter Diagnoses  Name Primary?  . Neck pain Yes  . Spondylosis without myelopathy or radiculopathy, cervical region   . Cervical posterior longitudinal ligament ossification (HCC)     Imaging I ordered a neck film she has arthritis or spondylosis in the mid to low cervical spine see report  Plan:  (Rx., Inj., surg., Frx, MRI/CT, XR:2)  CT scan report review from March 16, 2019 CT scan shows multilevel cervical disc disease most pronounced at C5-6 and C6-7 there is ossification of the posterior longitudinal ligament in the upper thoracic spine and mild canal stenosis    MRI REPORT REVIEW 2004  IMPRESSION 1)    STABLE SMALL CENTRAL DISK PROTRUSION AT C3-4. 2)    NEW OR PROGRESSIVE LEFT FORAMINAL DISK PROTRUSION AND UNCINATE SPURRING CHANGE AT C5-6 LIKELY AFFECTING THE LEFT C6 NERVE ROOT.  RECOMMEND CORRELATION WITH LEFT C6 RADICULOPATHY. 3)    STABLE LEFT PARACENTRAL AND MEDIAL FORAMINAL DISK PROTRUSION AND OSTEOPHYTIC SPURRING AT C6-7 WITH MASS EFFECT ON THE LEFT  C7 NERVE ROOT. 4)   PROBABLE STABLE CHANGES OF OSTEOPHYTIC SPURRING AND DISK PROTRUSION AT T2-3.  Meds ordered this encounter  Medications  . meloxicam (MOBIC) 7.5 MG tablet    Sig: Take 1 tablet (7.5 mg total) by mouth daily.    Dispense:  30 tablet    Refill:  5  . tiZANidine (ZANAFLEX) 4 MG tablet    Sig: Take 1 tablet (4 mg total) by mouth daily.    Dispense:  30 tablet    Refill:  1   Recommend referral to neurosurgery for management.  We will start some NSAIDs for pain relief and some tizanidine for muscle soreness    9:29 AM Arther Abbott, MD  04/10/2019

## 2019-04-11 DIAGNOSIS — N186 End stage renal disease: Secondary | ICD-10-CM | POA: Diagnosis not present

## 2019-04-11 DIAGNOSIS — D509 Iron deficiency anemia, unspecified: Secondary | ICD-10-CM | POA: Diagnosis not present

## 2019-04-11 DIAGNOSIS — D631 Anemia in chronic kidney disease: Secondary | ICD-10-CM | POA: Diagnosis not present

## 2019-04-11 DIAGNOSIS — N2581 Secondary hyperparathyroidism of renal origin: Secondary | ICD-10-CM | POA: Diagnosis not present

## 2019-04-12 DIAGNOSIS — D631 Anemia in chronic kidney disease: Secondary | ICD-10-CM | POA: Diagnosis not present

## 2019-04-12 DIAGNOSIS — N2581 Secondary hyperparathyroidism of renal origin: Secondary | ICD-10-CM | POA: Diagnosis not present

## 2019-04-12 DIAGNOSIS — D509 Iron deficiency anemia, unspecified: Secondary | ICD-10-CM | POA: Diagnosis not present

## 2019-04-12 DIAGNOSIS — N186 End stage renal disease: Secondary | ICD-10-CM | POA: Diagnosis not present

## 2019-04-13 DIAGNOSIS — N186 End stage renal disease: Secondary | ICD-10-CM | POA: Diagnosis not present

## 2019-04-14 ENCOUNTER — Other Ambulatory Visit: Payer: Self-pay | Admitting: Cardiology

## 2019-04-14 DIAGNOSIS — N186 End stage renal disease: Secondary | ICD-10-CM | POA: Diagnosis not present

## 2019-04-15 DIAGNOSIS — N186 End stage renal disease: Secondary | ICD-10-CM | POA: Diagnosis not present

## 2019-04-16 ENCOUNTER — Ambulatory Visit (HOSPITAL_COMMUNITY): Payer: Medicare PPO | Admitting: Psychiatry

## 2019-04-16 DIAGNOSIS — N186 End stage renal disease: Secondary | ICD-10-CM | POA: Diagnosis not present

## 2019-04-16 DIAGNOSIS — S46819A Strain of other muscles, fascia and tendons at shoulder and upper arm level, unspecified arm, initial encounter: Secondary | ICD-10-CM | POA: Diagnosis not present

## 2019-04-16 DIAGNOSIS — Z9889 Other specified postprocedural states: Secondary | ICD-10-CM | POA: Diagnosis not present

## 2019-04-16 DIAGNOSIS — I10 Essential (primary) hypertension: Secondary | ICD-10-CM | POA: Diagnosis not present

## 2019-04-16 DIAGNOSIS — Z6827 Body mass index (BMI) 27.0-27.9, adult: Secondary | ICD-10-CM | POA: Diagnosis not present

## 2019-04-17 DIAGNOSIS — N186 End stage renal disease: Secondary | ICD-10-CM | POA: Diagnosis not present

## 2019-04-18 DIAGNOSIS — N186 End stage renal disease: Secondary | ICD-10-CM | POA: Diagnosis not present

## 2019-04-19 DIAGNOSIS — N186 End stage renal disease: Secondary | ICD-10-CM | POA: Diagnosis not present

## 2019-04-20 ENCOUNTER — Other Ambulatory Visit: Payer: Self-pay

## 2019-04-20 ENCOUNTER — Ambulatory Visit (INDEPENDENT_AMBULATORY_CARE_PROVIDER_SITE_OTHER): Payer: Medicare PPO | Admitting: Psychiatry

## 2019-04-20 DIAGNOSIS — F322 Major depressive disorder, single episode, severe without psychotic features: Secondary | ICD-10-CM

## 2019-04-20 DIAGNOSIS — N186 End stage renal disease: Secondary | ICD-10-CM | POA: Diagnosis not present

## 2019-04-20 NOTE — Progress Notes (Signed)
Virtual Visit via Telephone Note  I connected with April Ward on 04/20/19 at  3:00 PM EST by telephone and verified that I am speaking with the correct person using two identifiers.   I discussed the limitations, risks, security and privacy concerns of performing an evaluation and management service by telephone and the availability of in person appointments. I also discussed with the patient that there may be a patient responsible charge related to this service. The patient expressed understanding and agreed to proceed.  .  I provided 40 minutes of non-face-to-face time during this encounter.          Alonza Smoker, LCSW   THERAPIST PROGRESS NOTE  Session Time:   Monday 04/20/2019 3:10 - 3:50 PM    Participation Level: Active  Behavioral Response: Alert/depressed/  Type of Therapy: Individual Therapy  Treatment Goals addressed:Learn and implement cognitive and behavioral strategies to overcome depression            Interventions: CBT and Supportive  Summary: April Ward is a 59 y.o. female who presents with a history of recurrent symptoms of depression and anxiety. She states she initially became depressed in May of 2014 when she and her sisters began having issues regarding expenses for the family home. 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 Patient reports lifelong perfectionistic tendencies and says anxiety has worsened stating that she tends to think too much. Patient's symptoms included depressed mood, anxiety, racing thoughts, ruminating thoughts, sleep difficulty (2-3 hours of sleep per night) excessive worrying, crying spells, loss of interest in activities, and loss of appetite.   Patient last was seen in in October 2019.  She is resuming services today due to  increased symptoms of depression.  Patient reports sadness, poor motivation, and decreased involvement in activities.  She reports little structure in her day and decreased involvement in activities.  She used to very much enjoy her working out at [employer] but has stopped attending due to effects of coronavirus pandemic.  She also reports little to no face-to-face contact with her daughter and grandchildren.  She attends church but states having to stay in her car and not being able to interact with fellow church members due to social distancing.  She reports feeling more isolated.   Suicidal/Homicidal: No  Therapist Response: Reviewed symptoms, discussed stressors, facilitated expression of thoughts and feelings, validated feelings,  assisted patient identify ways to increase behavioral activation by determining ways patient can pursue her interest in exercise at home, assisted patient develop plan to begin an exercise regimen at home, assisted patient identify and address thoughts and processes that may inhibit implementation of plan, also began to explore safe and creative ways to have contact and nurture her relationship with her granddaughter  Plan: Return again in 2 weeks.   Diagnosis:Axis I: MDD, GAD  Axis II: No diagnosis

## 2019-04-21 DIAGNOSIS — N186 End stage renal disease: Secondary | ICD-10-CM | POA: Diagnosis not present

## 2019-04-22 DIAGNOSIS — N186 End stage renal disease: Secondary | ICD-10-CM | POA: Diagnosis not present

## 2019-04-23 DIAGNOSIS — N186 End stage renal disease: Secondary | ICD-10-CM | POA: Diagnosis not present

## 2019-04-24 DIAGNOSIS — N186 End stage renal disease: Secondary | ICD-10-CM | POA: Diagnosis not present

## 2019-04-25 DIAGNOSIS — N186 End stage renal disease: Secondary | ICD-10-CM | POA: Diagnosis not present

## 2019-04-26 DIAGNOSIS — N186 End stage renal disease: Secondary | ICD-10-CM | POA: Diagnosis not present

## 2019-04-27 DIAGNOSIS — N186 End stage renal disease: Secondary | ICD-10-CM | POA: Diagnosis not present

## 2019-04-28 DIAGNOSIS — N186 End stage renal disease: Secondary | ICD-10-CM | POA: Diagnosis not present

## 2019-04-29 DIAGNOSIS — N186 End stage renal disease: Secondary | ICD-10-CM | POA: Diagnosis not present

## 2019-04-30 DIAGNOSIS — N186 End stage renal disease: Secondary | ICD-10-CM | POA: Diagnosis not present

## 2019-05-01 DIAGNOSIS — N186 End stage renal disease: Secondary | ICD-10-CM | POA: Diagnosis not present

## 2019-05-02 DIAGNOSIS — N186 End stage renal disease: Secondary | ICD-10-CM | POA: Diagnosis not present

## 2019-05-03 DIAGNOSIS — Z992 Dependence on renal dialysis: Secondary | ICD-10-CM | POA: Diagnosis not present

## 2019-05-03 DIAGNOSIS — N186 End stage renal disease: Secondary | ICD-10-CM | POA: Diagnosis not present

## 2019-05-04 DIAGNOSIS — D631 Anemia in chronic kidney disease: Secondary | ICD-10-CM | POA: Diagnosis not present

## 2019-05-04 DIAGNOSIS — N2581 Secondary hyperparathyroidism of renal origin: Secondary | ICD-10-CM | POA: Diagnosis not present

## 2019-05-04 DIAGNOSIS — D509 Iron deficiency anemia, unspecified: Secondary | ICD-10-CM | POA: Diagnosis not present

## 2019-05-04 DIAGNOSIS — N186 End stage renal disease: Secondary | ICD-10-CM | POA: Diagnosis not present

## 2019-05-05 ENCOUNTER — Other Ambulatory Visit: Payer: Self-pay

## 2019-05-05 ENCOUNTER — Ambulatory Visit (INDEPENDENT_AMBULATORY_CARE_PROVIDER_SITE_OTHER): Payer: Medicare PPO | Admitting: Psychiatry

## 2019-05-05 ENCOUNTER — Encounter (HOSPITAL_COMMUNITY): Payer: Self-pay | Admitting: Psychiatry

## 2019-05-05 DIAGNOSIS — F322 Major depressive disorder, single episode, severe without psychotic features: Secondary | ICD-10-CM

## 2019-05-05 DIAGNOSIS — D509 Iron deficiency anemia, unspecified: Secondary | ICD-10-CM | POA: Diagnosis not present

## 2019-05-05 DIAGNOSIS — F411 Generalized anxiety disorder: Secondary | ICD-10-CM

## 2019-05-05 DIAGNOSIS — N2581 Secondary hyperparathyroidism of renal origin: Secondary | ICD-10-CM | POA: Diagnosis not present

## 2019-05-05 DIAGNOSIS — N186 End stage renal disease: Secondary | ICD-10-CM | POA: Diagnosis not present

## 2019-05-05 DIAGNOSIS — D631 Anemia in chronic kidney disease: Secondary | ICD-10-CM | POA: Diagnosis not present

## 2019-05-05 MED ORDER — TEMAZEPAM 30 MG PO CAPS: 30.0000 mg | ORAL_CAPSULE | Freq: Every evening | ORAL | 2 refills | Status: DC | PRN

## 2019-05-05 MED ORDER — PRAZOSIN HCL 5 MG PO CAPS: 5.0000 mg | ORAL_CAPSULE | Freq: Every day | ORAL | 2 refills | Status: DC

## 2019-05-05 MED ORDER — BUPROPION HCL ER (XL) 300 MG PO TB24: 300.0000 mg | ORAL_TABLET | ORAL | 2 refills | Status: DC

## 2019-05-05 MED ORDER — SERTRALINE HCL 100 MG PO TABS: 100.0000 mg | ORAL_TABLET | Freq: Every day | ORAL | 2 refills | Status: DC

## 2019-05-05 MED ORDER — ALPRAZOLAM 1 MG PO TABS: 1.0000 mg | ORAL_TABLET | Freq: Four times a day (QID) | ORAL | 2 refills | Status: DC

## 2019-05-05 NOTE — Progress Notes (Signed)
Virtual Visit via Video Note  I connected with April Ward on 05/05/19 at  9:00 AM EST by a video enabled telemedicine application and verified that I am speaking with the correct person using two identifiers.   I discussed the limitations of evaluation and management by telemedicine and the availability of in person appointments. The patient expressed understanding and agreed to proceed.    I discussed the assessment and treatment plan with the patient. The patient was provided an opportunity to ask questions and all were answered. The patient agreed with the plan and demonstrated an understanding of the instructions.   The patient was advised to call back or seek an in-person evaluation if the symptoms worsen or if the condition fails to improve as anticipated.  I provided 15 minutes of non-face-to-face time during this encounter.   [name], MD  [facility] MD/PA/NP OP Progress Note  05/05/2019 9:43 AM April Ward  MRN:  [id number]  Chief Complaint:  Chief Complaint    Depression; Anxiety; Follow-up     HPI: This patient is a 59 year old widowed black female who lives with her son in [place].  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  The patient returns for follow-up after 4 weeks.  Last time we added Zoloft to her regimen again in hopes of improving her depression.  She thinks it is helped only a little bit at the 50 mg dose.  She still has numerous reasons to be depressed.  She is in renal failure and is getting peritoneal dialysis every night.  She is on the transplant list and is being evaluated for possible transplant.  The physicians at [facility] are not happy about her being on Xanax but when we tried to stop it she gets extremely anxious.  The patient feels very isolated.  She was used to going to the gym every day but her renal specialist is concerned about possible exposure to coronavirus so she is only going when there are few people.  She is having  far less connections with family because of the coronavirus pandemic.  Also her pastor who had been at her church more than 40 years recently died and this has been a huge loss for her.  She has restarted therapy here with Maurice Small.  She is trying her best to keep going and denies any thoughts of suicide.  I suggested we go up more on the Zoloft to the 100 mg dose.  As the weather gets warmer in the springtime I am hoping she will be able to get outdoors more and feel less isolated. Visit Diagnosis:    ICD-10-CM   1. Major depressive disorder, single episode, severe without psychotic features ([place])  F32.2   2. GAD (generalized anxiety disorder)  F41.1     Past Psychiatric History: Long-term outpatient treatment  Past Medical History:  Past Medical History:  Diagnosis Date  . Anxiety   . Arthritis   . Depression   . Diabetes mellitus without complication ([place])   . GERD (gastroesophageal reflux disease)   . Gout   . Heart murmur   . Hypertension   . Renal cancer ([place])   . Renal disease 10/2012  . Renal insufficiency     Past Surgical History:  Procedure Laterality Date  . CESAREAN SECTION  [id number]  . CHONDROPLASTY Right 08/22/2012   Procedure: CHONDROPLASTY;  Surgeon: Carole Civil, MD;  Location: [facility];  Service: Orthopedics;  Laterality: Right;  .  COLONOSCOPY WITH PROPOFOL N/A 03/16/2014   Procedure: COLONOSCOPY WITH PROPOFOL (at cecum 1023, total withdrawal time=9 minutes);  Surgeon: Danie Binder, MD;  Location: [facility];  Service: Endoscopy;  Laterality: N/A;  . GASTRIC BYPASS    . KNEE ARTHROSCOPY WITH MEDIAL MENISECTOMY Right 08/22/2012   Procedure: KNEE ARTHROSCOPY WITH PARTIAL MEDIAL MENISECTOMY;  Surgeon: Carole Civil, MD;  Location: [facility];  Service: Orthopedics;  Laterality: Right;  . PARTIAL NEPHRECTOMY  Dec 2014   left  . TENDON REPAIR      Family Psychiatric History: see below  Family History:  Family History  Problem Relation Age of Onset  .  Heart attack Mother   . Heart attack Father   . Depression Paternal Aunt   . Alcohol abuse Maternal Uncle   . Colon cancer Maternal Aunt   . Colon cancer Maternal Uncle     Social History:  Social History   Socioeconomic History  . Marital status: Widowed    Spouse name: Not on file  . Number of children: Not on file  . Years of education: Not on file  . Highest education level: Not on file  Occupational History  . Occupation: Visual merchandiser: [employer]  Tobacco Use  . Smoking status: Never Smoker  . Smokeless tobacco: Never Used  Substance and Sexual Activity  . Alcohol use: No    Alcohol/week: 0.0 standard drinks  . Drug use: No  . Sexual activity: Not on file  Other Topics Concern  . Not on file  Social History Narrative  . Not on file   Social Determinants of Health   Financial Resource Strain:   . Difficulty of Paying Living Expenses: Not on file  Food Insecurity:   . Worried About Charity fundraiser in the Last Year: Not on file  . Ran Out of Food in the Last Year: Not on file  Transportation Needs:   . Lack of Transportation (Medical): Not on file  . Lack of Transportation (Non-Medical): Not on file  Physical Activity:   . Days of Exercise per Week: Not on file  . Minutes of Exercise per Session: Not on file  Stress:   . Feeling of Stress : Not on file  Social Connections:   . Frequency of Communication with Friends and Family: Not on file  . Frequency of Social Gatherings with Friends and Family: Not on file  . Attends Religious Services: Not on file  . Active Member of Clubs or Organizations: Not on file  . Attends Archivist Meetings: Not on file  . Marital Status: Not on file    Allergies:  Allergies  Allergen Reactions  . Skelaxin [Metaxalone] Hives and Rash    Metabolic Disorder Labs: No results found for: HGBA1C, MPG No results found for: PROLACTIN Lab Results  Component Value Date   CHOL 215 (H)  03/11/2014   TRIG 288 (H) 03/11/2014   HDL 33 (L) 03/11/2014   CHOLHDL 6.5 03/11/2014   VLDL 58 (H) 03/11/2014   LDLCALC 124 (H) 03/11/2014   No results found for: TSH  Therapeutic Level Labs: No results found for: LITHIUM No results found for: VALPROATE No components found for:  CBMZ  Current Medications: Current Outpatient Medications  Medication Sig Dispense Refill  . ALPRAZolam (XANAX) 1 MG tablet Take 1 tablet (1 mg total) by mouth 4 (four) times daily. 60 tablet 2  . amLODipine (NORVASC) 10 MG tablet Take 1 tablet by mouth daily.    Marland Kitchen  buPROPion (WELLBUTRIN XL) 300 MG 24 hr tablet Take 1 tablet (300 mg total) by mouth every morning. 90 tablet 2  . calcitRIOL (ROCALTROL) 0.25 MCG capsule Take 1 capsule by mouth 3 (three) times a week. M-W-F    . hydrALAZINE (APRESOLINE) 100 MG tablet TAKE 1 TABLET THREE TIMES DAILY 270 tablet 1  . lactulose (CONSTULOSE) 10 GM/15ML solution Take 10 mLs by mouth 2 (two) times daily as needed for constipation.    Marland Kitchen levothyroxine (SYNTHROID, LEVOTHROID) 200 MCG tablet Take 1 tablet by mouth daily.    Marland Kitchen losartan (COZAAR) 50 MG tablet TAKE 1 TABLET (50 MG TOTAL) BY MOUTH DAILY. 90 tablet 1  . meclizine (ANTIVERT) 25 MG tablet Take 1 tablet (25 mg total) by mouth every 8 (eight) hours as needed for dizziness. 90 tablet 0  . meloxicam (MOBIC) 7.5 MG tablet Take 1 tablet (7.5 mg total) by mouth daily. 30 tablet 5  . multivitamin (RENA-VIT) TABS tablet Take 1 tablet by mouth daily.    . prazosin (MINIPRESS) 5 MG capsule Take 1 capsule (5 mg total) by mouth at bedtime. 90 capsule 2  . sertraline (ZOLOFT) 100 MG tablet Take 1 tablet (100 mg total) by mouth daily. 90 tablet 2  . sevelamer carbonate (RENVELA) 800 MG tablet Take 800 mg by mouth daily.     . temazepam (RESTORIL) 30 MG capsule Take 1 capsule (30 mg total) by mouth at bedtime as needed. for sleep 30 capsule 2  . tiZANidine (ZANAFLEX) 4 MG tablet Take 1 tablet (4 mg total) by mouth daily. 30 tablet  1  . vitamin B-12 (CYANOCOBALAMIN) 500 MCG tablet Take 500 mcg by mouth daily.     No current facility-administered medications for this visit.     Musculoskeletal: Strength & Muscle Tone: within normal limits Gait & Station: normal Patient leans: N/A  Psychiatric Specialty Exam: Review of Systems  Psychiatric/Behavioral: Positive for dysphoric mood. The patient is nervous/anxious.   All other systems reviewed and are negative.   There were no vitals taken for this visit.There is no height or weight on file to calculate BMI.  General Appearance: Casual and Fairly Groomed  Eye Contact:  Good  Speech:  Clear and Coherent  Volume:  Normal  Mood:  Anxious and Dysphoric  Affect:  Depressed and Tearful  Thought Process:  Goal Directed  Orientation:  Full (Time, Place, and Person)  Thought Content: Rumination   Suicidal Thoughts:  No  Homicidal Thoughts:  No  Memory:  Immediate;   Good Recent;   Good Remote;   Good  Judgement:  Good  Insight:  Good  Psychomotor Activity:  Normal  Concentration:  Concentration: Good and Attention Span: Good  Recall:  Good  Fund of Knowledge: Good  Language: Good  Akathisia:  No  Handed:  Right  AIMS (if indicated): not done  Assets:  Communication Skills Desire for Improvement Resilience Social Support Talents/Skills  ADL's:  Intact  Cognition: WNL  Sleep:  Fair   Screenings:   Assessment and Plan: This patient is a 59 year old female with a history of end-stage renal disease depression and anxiety.  She has been more depressed lately most sleepy because of the isolation due to the coronavirus pandemic and the restrictions placed on her because of her kidney disease.  We will increase Zoloft to 100 mg daily for depression as well as the Wellbutrin XL 300 mg every morning for depression.  She will continue Restoril 30 mg at bedtime for sleep, prazosin  5 mg at bedtime for nightmares and Xanax 1 mg 4 times daily for anxiety.  She will  return to see me in 6 weeks or call sooner as needed   [name], MD 05/05/2019, 9:43 AM

## 2019-05-06 DIAGNOSIS — Z1159 Encounter for screening for other viral diseases: Secondary | ICD-10-CM | POA: Diagnosis not present

## 2019-05-06 DIAGNOSIS — Z114 Encounter for screening for human immunodeficiency virus [HIV]: Secondary | ICD-10-CM | POA: Diagnosis not present

## 2019-05-06 DIAGNOSIS — N2581 Secondary hyperparathyroidism of renal origin: Secondary | ICD-10-CM | POA: Diagnosis not present

## 2019-05-06 DIAGNOSIS — D631 Anemia in chronic kidney disease: Secondary | ICD-10-CM | POA: Diagnosis not present

## 2019-05-06 DIAGNOSIS — D509 Iron deficiency anemia, unspecified: Secondary | ICD-10-CM | POA: Diagnosis not present

## 2019-05-06 DIAGNOSIS — N186 End stage renal disease: Secondary | ICD-10-CM | POA: Diagnosis not present

## 2019-05-07 DIAGNOSIS — D631 Anemia in chronic kidney disease: Secondary | ICD-10-CM | POA: Diagnosis not present

## 2019-05-07 DIAGNOSIS — N186 End stage renal disease: Secondary | ICD-10-CM | POA: Diagnosis not present

## 2019-05-07 DIAGNOSIS — N2581 Secondary hyperparathyroidism of renal origin: Secondary | ICD-10-CM | POA: Diagnosis not present

## 2019-05-07 DIAGNOSIS — D509 Iron deficiency anemia, unspecified: Secondary | ICD-10-CM | POA: Diagnosis not present

## 2019-05-08 DIAGNOSIS — D631 Anemia in chronic kidney disease: Secondary | ICD-10-CM | POA: Diagnosis not present

## 2019-05-08 DIAGNOSIS — N186 End stage renal disease: Secondary | ICD-10-CM | POA: Diagnosis not present

## 2019-05-08 DIAGNOSIS — N2581 Secondary hyperparathyroidism of renal origin: Secondary | ICD-10-CM | POA: Diagnosis not present

## 2019-05-08 DIAGNOSIS — D509 Iron deficiency anemia, unspecified: Secondary | ICD-10-CM | POA: Diagnosis not present

## 2019-05-09 DIAGNOSIS — D509 Iron deficiency anemia, unspecified: Secondary | ICD-10-CM | POA: Diagnosis not present

## 2019-05-09 DIAGNOSIS — N186 End stage renal disease: Secondary | ICD-10-CM | POA: Diagnosis not present

## 2019-05-09 DIAGNOSIS — D631 Anemia in chronic kidney disease: Secondary | ICD-10-CM | POA: Diagnosis not present

## 2019-05-09 DIAGNOSIS — N2581 Secondary hyperparathyroidism of renal origin: Secondary | ICD-10-CM | POA: Diagnosis not present

## 2019-05-10 DIAGNOSIS — D631 Anemia in chronic kidney disease: Secondary | ICD-10-CM | POA: Diagnosis not present

## 2019-05-10 DIAGNOSIS — N2581 Secondary hyperparathyroidism of renal origin: Secondary | ICD-10-CM | POA: Diagnosis not present

## 2019-05-10 DIAGNOSIS — N186 End stage renal disease: Secondary | ICD-10-CM | POA: Diagnosis not present

## 2019-05-10 DIAGNOSIS — D509 Iron deficiency anemia, unspecified: Secondary | ICD-10-CM | POA: Diagnosis not present

## 2019-05-11 DIAGNOSIS — N186 End stage renal disease: Secondary | ICD-10-CM | POA: Diagnosis not present

## 2019-05-11 DIAGNOSIS — D509 Iron deficiency anemia, unspecified: Secondary | ICD-10-CM | POA: Diagnosis not present

## 2019-05-11 DIAGNOSIS — N2581 Secondary hyperparathyroidism of renal origin: Secondary | ICD-10-CM | POA: Diagnosis not present

## 2019-05-11 DIAGNOSIS — D631 Anemia in chronic kidney disease: Secondary | ICD-10-CM | POA: Diagnosis not present

## 2019-05-12 DIAGNOSIS — N2581 Secondary hyperparathyroidism of renal origin: Secondary | ICD-10-CM | POA: Diagnosis not present

## 2019-05-12 DIAGNOSIS — D631 Anemia in chronic kidney disease: Secondary | ICD-10-CM | POA: Diagnosis not present

## 2019-05-12 DIAGNOSIS — D509 Iron deficiency anemia, unspecified: Secondary | ICD-10-CM | POA: Diagnosis not present

## 2019-05-12 DIAGNOSIS — N186 End stage renal disease: Secondary | ICD-10-CM | POA: Diagnosis not present

## 2019-05-13 ENCOUNTER — Other Ambulatory Visit: Payer: Self-pay | Admitting: Cardiology

## 2019-05-13 DIAGNOSIS — Z79899 Other long term (current) drug therapy: Secondary | ICD-10-CM | POA: Diagnosis not present

## 2019-05-13 DIAGNOSIS — N2581 Secondary hyperparathyroidism of renal origin: Secondary | ICD-10-CM | POA: Diagnosis not present

## 2019-05-13 DIAGNOSIS — Z1159 Encounter for screening for other viral diseases: Secondary | ICD-10-CM | POA: Diagnosis not present

## 2019-05-13 DIAGNOSIS — E119 Type 2 diabetes mellitus without complications: Secondary | ICD-10-CM | POA: Diagnosis not present

## 2019-05-13 DIAGNOSIS — D509 Iron deficiency anemia, unspecified: Secondary | ICD-10-CM | POA: Diagnosis not present

## 2019-05-13 DIAGNOSIS — Z7682 Awaiting organ transplant status: Secondary | ICD-10-CM | POA: Diagnosis not present

## 2019-05-13 DIAGNOSIS — N186 End stage renal disease: Secondary | ICD-10-CM | POA: Diagnosis not present

## 2019-05-13 DIAGNOSIS — Z01810 Encounter for preprocedural cardiovascular examination: Secondary | ICD-10-CM | POA: Diagnosis not present

## 2019-05-13 DIAGNOSIS — D631 Anemia in chronic kidney disease: Secondary | ICD-10-CM | POA: Diagnosis not present

## 2019-05-14 DIAGNOSIS — N186 End stage renal disease: Secondary | ICD-10-CM | POA: Diagnosis not present

## 2019-05-15 ENCOUNTER — Ambulatory Visit (HOSPITAL_COMMUNITY): Payer: Medicare PPO | Admitting: Psychiatry

## 2019-05-15 ENCOUNTER — Other Ambulatory Visit: Payer: Self-pay

## 2019-05-15 ENCOUNTER — Telehealth (HOSPITAL_COMMUNITY): Payer: Self-pay | Admitting: Psychiatry

## 2019-05-15 DIAGNOSIS — N186 End stage renal disease: Secondary | ICD-10-CM | POA: Diagnosis not present

## 2019-05-15 NOTE — Telephone Encounter (Signed)
Therapist called patient for scheduled appointment and received a voicemail message.  Therapist left message indicating attempt to contact for appointment and requesting patient call office.

## 2019-05-16 DIAGNOSIS — N186 End stage renal disease: Secondary | ICD-10-CM | POA: Diagnosis not present

## 2019-05-17 DIAGNOSIS — N186 End stage renal disease: Secondary | ICD-10-CM | POA: Diagnosis not present

## 2019-05-18 DIAGNOSIS — N186 End stage renal disease: Secondary | ICD-10-CM | POA: Diagnosis not present

## 2019-05-19 DIAGNOSIS — N186 End stage renal disease: Secondary | ICD-10-CM | POA: Diagnosis not present

## 2019-05-20 DIAGNOSIS — N186 End stage renal disease: Secondary | ICD-10-CM | POA: Diagnosis not present

## 2019-05-21 DIAGNOSIS — N186 End stage renal disease: Secondary | ICD-10-CM | POA: Diagnosis not present

## 2019-05-22 DIAGNOSIS — N186 End stage renal disease: Secondary | ICD-10-CM | POA: Diagnosis not present

## 2019-05-23 DIAGNOSIS — N186 End stage renal disease: Secondary | ICD-10-CM | POA: Diagnosis not present

## 2019-05-24 DIAGNOSIS — N186 End stage renal disease: Secondary | ICD-10-CM | POA: Diagnosis not present

## 2019-05-25 DIAGNOSIS — N186 End stage renal disease: Secondary | ICD-10-CM | POA: Diagnosis not present

## 2019-05-26 DIAGNOSIS — N186 End stage renal disease: Secondary | ICD-10-CM | POA: Diagnosis not present

## 2019-05-27 DIAGNOSIS — N186 End stage renal disease: Secondary | ICD-10-CM | POA: Diagnosis not present

## 2019-05-28 DIAGNOSIS — N186 End stage renal disease: Secondary | ICD-10-CM | POA: Diagnosis not present

## 2019-05-29 DIAGNOSIS — N186 End stage renal disease: Secondary | ICD-10-CM | POA: Diagnosis not present

## 2019-05-30 DIAGNOSIS — N186 End stage renal disease: Secondary | ICD-10-CM | POA: Diagnosis not present

## 2019-05-31 DIAGNOSIS — N186 End stage renal disease: Secondary | ICD-10-CM | POA: Diagnosis not present

## 2019-05-31 DIAGNOSIS — Z992 Dependence on renal dialysis: Secondary | ICD-10-CM | POA: Diagnosis not present

## 2019-06-01 DIAGNOSIS — N2581 Secondary hyperparathyroidism of renal origin: Secondary | ICD-10-CM | POA: Diagnosis not present

## 2019-06-01 DIAGNOSIS — M7918 Myalgia, other site: Secondary | ICD-10-CM | POA: Diagnosis not present

## 2019-06-01 DIAGNOSIS — M4802 Spinal stenosis, cervical region: Secondary | ICD-10-CM | POA: Diagnosis not present

## 2019-06-01 DIAGNOSIS — N186 End stage renal disease: Secondary | ICD-10-CM | POA: Diagnosis not present

## 2019-06-01 DIAGNOSIS — D509 Iron deficiency anemia, unspecified: Secondary | ICD-10-CM | POA: Diagnosis not present

## 2019-06-01 DIAGNOSIS — M5020 Other cervical disc displacement, unspecified cervical region: Secondary | ICD-10-CM | POA: Diagnosis not present

## 2019-06-01 DIAGNOSIS — D631 Anemia in chronic kidney disease: Secondary | ICD-10-CM | POA: Diagnosis not present

## 2019-06-02 DIAGNOSIS — D509 Iron deficiency anemia, unspecified: Secondary | ICD-10-CM | POA: Diagnosis not present

## 2019-06-02 DIAGNOSIS — N186 End stage renal disease: Secondary | ICD-10-CM | POA: Diagnosis not present

## 2019-06-02 DIAGNOSIS — D631 Anemia in chronic kidney disease: Secondary | ICD-10-CM | POA: Diagnosis not present

## 2019-06-02 DIAGNOSIS — N2581 Secondary hyperparathyroidism of renal origin: Secondary | ICD-10-CM | POA: Diagnosis not present

## 2019-06-03 DIAGNOSIS — D631 Anemia in chronic kidney disease: Secondary | ICD-10-CM | POA: Diagnosis not present

## 2019-06-03 DIAGNOSIS — D509 Iron deficiency anemia, unspecified: Secondary | ICD-10-CM | POA: Diagnosis not present

## 2019-06-03 DIAGNOSIS — N2581 Secondary hyperparathyroidism of renal origin: Secondary | ICD-10-CM | POA: Diagnosis not present

## 2019-06-03 DIAGNOSIS — N186 End stage renal disease: Secondary | ICD-10-CM | POA: Diagnosis not present

## 2019-06-04 DIAGNOSIS — Z4932 Encounter for adequacy testing for peritoneal dialysis: Secondary | ICD-10-CM | POA: Diagnosis not present

## 2019-06-04 DIAGNOSIS — D509 Iron deficiency anemia, unspecified: Secondary | ICD-10-CM | POA: Diagnosis not present

## 2019-06-04 DIAGNOSIS — N186 End stage renal disease: Secondary | ICD-10-CM | POA: Diagnosis not present

## 2019-06-04 DIAGNOSIS — N2581 Secondary hyperparathyroidism of renal origin: Secondary | ICD-10-CM | POA: Diagnosis not present

## 2019-06-04 DIAGNOSIS — D631 Anemia in chronic kidney disease: Secondary | ICD-10-CM | POA: Diagnosis not present

## 2019-06-04 DIAGNOSIS — Z7682 Awaiting organ transplant status: Secondary | ICD-10-CM | POA: Diagnosis not present

## 2019-06-05 DIAGNOSIS — N2581 Secondary hyperparathyroidism of renal origin: Secondary | ICD-10-CM | POA: Diagnosis not present

## 2019-06-05 DIAGNOSIS — D631 Anemia in chronic kidney disease: Secondary | ICD-10-CM | POA: Diagnosis not present

## 2019-06-05 DIAGNOSIS — D509 Iron deficiency anemia, unspecified: Secondary | ICD-10-CM | POA: Diagnosis not present

## 2019-06-05 DIAGNOSIS — N186 End stage renal disease: Secondary | ICD-10-CM | POA: Diagnosis not present

## 2019-06-06 DIAGNOSIS — N2581 Secondary hyperparathyroidism of renal origin: Secondary | ICD-10-CM | POA: Diagnosis not present

## 2019-06-06 DIAGNOSIS — D509 Iron deficiency anemia, unspecified: Secondary | ICD-10-CM | POA: Diagnosis not present

## 2019-06-06 DIAGNOSIS — N186 End stage renal disease: Secondary | ICD-10-CM | POA: Diagnosis not present

## 2019-06-06 DIAGNOSIS — D631 Anemia in chronic kidney disease: Secondary | ICD-10-CM | POA: Diagnosis not present

## 2019-06-07 DIAGNOSIS — D509 Iron deficiency anemia, unspecified: Secondary | ICD-10-CM | POA: Diagnosis not present

## 2019-06-07 DIAGNOSIS — D631 Anemia in chronic kidney disease: Secondary | ICD-10-CM | POA: Diagnosis not present

## 2019-06-07 DIAGNOSIS — N186 End stage renal disease: Secondary | ICD-10-CM | POA: Diagnosis not present

## 2019-06-07 DIAGNOSIS — N2581 Secondary hyperparathyroidism of renal origin: Secondary | ICD-10-CM | POA: Diagnosis not present

## 2019-06-08 DIAGNOSIS — N186 End stage renal disease: Secondary | ICD-10-CM | POA: Diagnosis not present

## 2019-06-08 DIAGNOSIS — D509 Iron deficiency anemia, unspecified: Secondary | ICD-10-CM | POA: Diagnosis not present

## 2019-06-08 DIAGNOSIS — D631 Anemia in chronic kidney disease: Secondary | ICD-10-CM | POA: Diagnosis not present

## 2019-06-08 DIAGNOSIS — N2581 Secondary hyperparathyroidism of renal origin: Secondary | ICD-10-CM | POA: Diagnosis not present

## 2019-06-09 DIAGNOSIS — N2581 Secondary hyperparathyroidism of renal origin: Secondary | ICD-10-CM | POA: Diagnosis not present

## 2019-06-09 DIAGNOSIS — D509 Iron deficiency anemia, unspecified: Secondary | ICD-10-CM | POA: Diagnosis not present

## 2019-06-09 DIAGNOSIS — D631 Anemia in chronic kidney disease: Secondary | ICD-10-CM | POA: Diagnosis not present

## 2019-06-09 DIAGNOSIS — N186 End stage renal disease: Secondary | ICD-10-CM | POA: Diagnosis not present

## 2019-06-10 ENCOUNTER — Ambulatory Visit (INDEPENDENT_AMBULATORY_CARE_PROVIDER_SITE_OTHER): Payer: Medicare PPO | Admitting: Psychiatry

## 2019-06-10 ENCOUNTER — Encounter (HOSPITAL_COMMUNITY): Payer: Self-pay | Admitting: Psychiatry

## 2019-06-10 ENCOUNTER — Other Ambulatory Visit: Payer: Self-pay

## 2019-06-10 DIAGNOSIS — N186 End stage renal disease: Secondary | ICD-10-CM | POA: Diagnosis not present

## 2019-06-10 DIAGNOSIS — F322 Major depressive disorder, single episode, severe without psychotic features: Secondary | ICD-10-CM | POA: Diagnosis not present

## 2019-06-10 DIAGNOSIS — F411 Generalized anxiety disorder: Secondary | ICD-10-CM | POA: Diagnosis not present

## 2019-06-10 DIAGNOSIS — D509 Iron deficiency anemia, unspecified: Secondary | ICD-10-CM | POA: Diagnosis not present

## 2019-06-10 DIAGNOSIS — D631 Anemia in chronic kidney disease: Secondary | ICD-10-CM | POA: Diagnosis not present

## 2019-06-10 DIAGNOSIS — N2581 Secondary hyperparathyroidism of renal origin: Secondary | ICD-10-CM | POA: Diagnosis not present

## 2019-06-10 MED ORDER — PRAZOSIN HCL 5 MG PO CAPS: 5.0000 mg | ORAL_CAPSULE | Freq: Every day | ORAL | 2 refills | Status: DC

## 2019-06-10 MED ORDER — SERTRALINE HCL 100 MG PO TABS: 100.0000 mg | ORAL_TABLET | Freq: Every day | ORAL | 2 refills | Status: DC

## 2019-06-10 MED ORDER — BUPROPION HCL ER (XL) 300 MG PO TB24: 300.0000 mg | ORAL_TABLET | ORAL | 2 refills | Status: DC

## 2019-06-10 MED ORDER — ALPRAZOLAM 1 MG PO TABS: 1.0000 mg | ORAL_TABLET | Freq: Four times a day (QID) | ORAL | 2 refills | Status: DC

## 2019-06-10 MED ORDER — BELSOMRA 20 MG PO TABS: 20.0000 mg | ORAL_TABLET | Freq: Every day | ORAL | 2 refills | Status: DC

## 2019-06-10 NOTE — Progress Notes (Signed)
Virtual Visit via Telephone Note  I connected with April Ward on 06/10/19 at  9:00 AM EST by telephone and verified that I am speaking with the correct person using two identifiers.   I discussed the limitations, risks, security and privacy concerns of performing an evaluation and management service by telephone and the availability of in person appointments. I also discussed with the patient that there may be a patient responsible charge related to this service. The patient expressed understanding and agreed to proceed.    I discussed the assessment and treatment plan with the patient. The patient was provided an opportunity to ask questions and all were answered. The patient agreed with the plan and demonstrated an understanding of the instructions.   The patient was advised to call back or seek an in-person evaluation if the symptoms worsen or if the condition fails to improve as anticipated.  I provided 15 minutes of non-face-to-face time during this encounter.   [name], MD  [facility] MD/PA/NP OP Progress Note  06/10/2019 9:24 AM April Ward  MRN:  [id number]  Chief Complaint:  Chief Complaint    Depression; Anxiety; Follow-up     HPI: This patient is a 59 year old widowed black female who lives with her son in [place].  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  The patient returns for follow-up after 4 weeks.  Last time she felt more depressed and sad and isolated.  I did increase her Zoloft which is helped to some degree.  The weather has gotten better so she is getting out more and walking.  She states that she had 1 bad day this week but she spent crying but other than that she has been doing okay.  However she is only sleeping about 4 hours a night.  She is already tried Ambien Ambien CR trazodone and now temazepam 30 mg.  I suggested we try Belsomra and she agrees.  She is still on peritoneal dialysis at night but she does not think this is what  is keeping her awake.  She denies thoughts of self-harm or suicide. Visit Diagnosis:    ICD-10-CM   1. Major depressive disorder, single episode, severe without psychotic features ([place])  F32.2   2. GAD (generalized anxiety disorder)  F41.1     Past Psychiatric History: Long-term outpatient treatment  Past Medical History:  Past Medical History:  Diagnosis Date  . Anxiety   . Arthritis   . Depression   . Diabetes mellitus without complication ([place])   . GERD (gastroesophageal reflux disease)   . Gout   . Heart murmur   . Hypertension   . Renal cancer ([place])   . Renal disease 10/2012  . Renal insufficiency     Past Surgical History:  Procedure Laterality Date  . CESAREAN SECTION  [id number]  . CHONDROPLASTY Right 08/22/2012   Procedure: CHONDROPLASTY;  Surgeon: Carole Civil, MD;  Location: [facility];  Service: Orthopedics;  Laterality: Right;  . COLONOSCOPY WITH PROPOFOL N/A 03/16/2014   Procedure: COLONOSCOPY WITH PROPOFOL (at cecum 1023, total withdrawal time=9 minutes);  Surgeon: Danie Binder, MD;  Location: [facility];  Service: Endoscopy;  Laterality: N/A;  . GASTRIC BYPASS    . KNEE ARTHROSCOPY WITH MEDIAL MENISECTOMY Right 08/22/2012   Procedure: KNEE ARTHROSCOPY WITH PARTIAL MEDIAL MENISECTOMY;  Surgeon: Carole Civil, MD;  Location: [facility];  Service: Orthopedics;  Laterality: Right;  . PARTIAL NEPHRECTOMY  Dec 2014  left  . TENDON REPAIR      Family Psychiatric History: see below  Family History:  Family History  Problem Relation Age of Onset  . Heart attack Mother   . Heart attack Father   . Depression Paternal Aunt   . Alcohol abuse Maternal Uncle   . Colon cancer Maternal Aunt   . Colon cancer Maternal Uncle     Social History:  Social History   Socioeconomic History  . Marital status: Widowed    Spouse name: Not on file  . Number of children: Not on file  . Years of education: Not on file  . Highest education level: Not on file  Occupational  History  . Occupation: Visual merchandiser: [employer]  Tobacco Use  . Smoking status: Never Smoker  . Smokeless tobacco: Never Used  Substance and Sexual Activity  . Alcohol use: No    Alcohol/week: 0.0 standard drinks  . Drug use: No  . Sexual activity: Not on file  Other Topics Concern  . Not on file  Social History Narrative  . Not on file   Social Determinants of Health   Financial Resource Strain:   . Difficulty of Paying Living Expenses: Not on file  Food Insecurity:   . Worried About Charity fundraiser in the Last Year: Not on file  . Ran Out of Food in the Last Year: Not on file  Transportation Needs:   . Lack of Transportation (Medical): Not on file  . Lack of Transportation (Non-Medical): Not on file  Physical Activity:   . Days of Exercise per Week: Not on file  . Minutes of Exercise per Session: Not on file  Stress:   . Feeling of Stress : Not on file  Social Connections:   . Frequency of Communication with Friends and Family: Not on file  . Frequency of Social Gatherings with Friends and Family: Not on file  . Attends Religious Services: Not on file  . Active Member of Clubs or Organizations: Not on file  . Attends Archivist Meetings: Not on file  . Marital Status: Not on file    Allergies:  Allergies  Allergen Reactions  . Skelaxin [Metaxalone] Hives and Rash    Metabolic Disorder Labs: No results found for: HGBA1C, MPG No results found for: PROLACTIN Lab Results  Component Value Date   CHOL 215 (H) 03/11/2014   TRIG 288 (H) 03/11/2014   HDL 33 (L) 03/11/2014   CHOLHDL 6.5 03/11/2014   VLDL 58 (H) 03/11/2014   LDLCALC 124 (H) 03/11/2014   No results found for: TSH  Therapeutic Level Labs: No results found for: LITHIUM No results found for: VALPROATE No components found for:  CBMZ  Current Medications: Current Outpatient Medications  Medication Sig Dispense Refill  . ALPRAZolam (XANAX) 1 MG tablet Take  1 tablet (1 mg total) by mouth 4 (four) times daily. 60 tablet 2  . amLODipine (NORVASC) 10 MG tablet Take 1 tablet by mouth daily.    Marland Kitchen buPROPion (WELLBUTRIN XL) 300 MG 24 hr tablet Take 1 tablet (300 mg total) by mouth every morning. 90 tablet 2  . calcitRIOL (ROCALTROL) 0.25 MCG capsule Take 1 capsule by mouth 3 (three) times a week. M-W-F    . hydrALAZINE (APRESOLINE) 100 MG tablet TAKE 1 TABLET THREE TIMES DAILY 270 tablet 1  . lactulose (CONSTULOSE) 10 GM/15ML solution Take 10 mLs by mouth 2 (two) times daily as needed for constipation.    Marland Kitchen  levothyroxine (SYNTHROID, LEVOTHROID) 200 MCG tablet Take 1 tablet by mouth daily.    Marland Kitchen losartan (COZAAR) 50 MG tablet TAKE 1 TABLET (50 MG TOTAL) BY MOUTH DAILY. 90 tablet 1  . meclizine (ANTIVERT) 25 MG tablet Take 1 tablet (25 mg total) by mouth every 8 (eight) hours as needed for dizziness. 90 tablet 0  . meloxicam (MOBIC) 7.5 MG tablet Take 1 tablet (7.5 mg total) by mouth daily. 30 tablet 5  . multivitamin (RENA-VIT) TABS tablet Take 1 tablet by mouth daily.    . prazosin (MINIPRESS) 5 MG capsule Take 1 capsule (5 mg total) by mouth at bedtime. 90 capsule 2  . sertraline (ZOLOFT) 100 MG tablet Take 1 tablet (100 mg total) by mouth daily. 90 tablet 2  . sevelamer carbonate (RENVELA) 800 MG tablet Take 800 mg by mouth daily.     . Suvorexant (BELSOMRA) 20 MG TABS Take 20 mg by mouth at bedtime. 30 tablet 2  . tiZANidine (ZANAFLEX) 4 MG tablet Take 1 tablet (4 mg total) by mouth daily. 30 tablet 1  . vitamin B-12 (CYANOCOBALAMIN) 500 MCG tablet Take 500 mcg by mouth daily.     No current facility-administered medications for this visit.     Musculoskeletal: Strength & Muscle Tone: within normal limits Gait & Station: normal Patient leans: N/A  Psychiatric Specialty Exam: Review of Systems  Psychiatric/Behavioral: Positive for sleep disturbance. The patient is nervous/anxious.   All other systems reviewed and are negative.   There were no  vitals taken for this visit.There is no height or weight on file to calculate BMI.  General Appearance: NA  Eye Contact:  NA  Speech:  Clear and Coherent  Volume:  Normal  Mood:  Anxious  Affect:  NA  Thought Process:  Goal Directed  Orientation:  Full (Time, Place, and Person)  Thought Content: Rumination   Suicidal Thoughts:  No  Homicidal Thoughts:  No  Memory:  Immediate;   Good Recent;   Good Remote;   Good  Judgement:  Good  Insight:  Good  Psychomotor Activity:  Normal  Concentration:  Concentration: Good and Attention Span: Good  Recall:  Good  Fund of Knowledge: Good  Language: Good  Akathisia:  No  Handed:  Right  AIMS (if indicated): not done  Assets:  Communication Skills Desire for Improvement Resilience Social Support Talents/Skills  ADL's:  Intact  Cognition: WNL  Sleep:  Poor   Screenings:   Assessment and Plan: This patient is a 59 year old female with a history of end-stage renal disease depression anxiety.  She seems to be doing a bit better in terms of depression but now she is having difficulty sleeping.  She will continue Zoloft 100 mg daily for depression as well as Wellbutrin XL 300 mg every morning also for depression.  Since her nightmares are resolved for the most part she will continue prazosin 5 mg at bedtime and continue Xanax 1 mg up to 4 times daily for anxiety.  She will discontinue Restoril and start Belsomra 20 mg at bedtime for sleep.  She will return to see me in 4 weeks and we will reinstate her therapy with Cherrie Distance, MD 06/10/2019, 9:24 AM

## 2019-06-11 DIAGNOSIS — N186 End stage renal disease: Secondary | ICD-10-CM | POA: Diagnosis not present

## 2019-06-12 DIAGNOSIS — N186 End stage renal disease: Secondary | ICD-10-CM | POA: Diagnosis not present

## 2019-06-13 DIAGNOSIS — N186 End stage renal disease: Secondary | ICD-10-CM | POA: Diagnosis not present

## 2019-06-14 DIAGNOSIS — N186 End stage renal disease: Secondary | ICD-10-CM | POA: Diagnosis not present

## 2019-06-15 DIAGNOSIS — N186 End stage renal disease: Secondary | ICD-10-CM | POA: Diagnosis not present

## 2019-06-16 DIAGNOSIS — N186 End stage renal disease: Secondary | ICD-10-CM | POA: Diagnosis not present

## 2019-06-17 DIAGNOSIS — N186 End stage renal disease: Secondary | ICD-10-CM | POA: Diagnosis not present

## 2019-06-18 DIAGNOSIS — N186 End stage renal disease: Secondary | ICD-10-CM | POA: Diagnosis not present

## 2019-06-19 DIAGNOSIS — N186 End stage renal disease: Secondary | ICD-10-CM | POA: Diagnosis not present

## 2019-06-20 DIAGNOSIS — N186 End stage renal disease: Secondary | ICD-10-CM | POA: Diagnosis not present

## 2019-06-21 DIAGNOSIS — N186 End stage renal disease: Secondary | ICD-10-CM | POA: Diagnosis not present

## 2019-06-22 DIAGNOSIS — N186 End stage renal disease: Secondary | ICD-10-CM | POA: Diagnosis not present

## 2019-06-23 DIAGNOSIS — N186 End stage renal disease: Secondary | ICD-10-CM | POA: Diagnosis not present

## 2019-06-24 DIAGNOSIS — Z5181 Encounter for therapeutic drug level monitoring: Secondary | ICD-10-CM | POA: Diagnosis not present

## 2019-06-24 DIAGNOSIS — Z91048 Other nonmedicinal substance allergy status: Secondary | ICD-10-CM | POA: Diagnosis not present

## 2019-06-24 DIAGNOSIS — N186 End stage renal disease: Secondary | ICD-10-CM | POA: Diagnosis not present

## 2019-06-24 DIAGNOSIS — L2084 Intrinsic (allergic) eczema: Secondary | ICD-10-CM | POA: Diagnosis not present

## 2019-06-25 DIAGNOSIS — N186 End stage renal disease: Secondary | ICD-10-CM | POA: Diagnosis not present

## 2019-06-26 DIAGNOSIS — N186 End stage renal disease: Secondary | ICD-10-CM | POA: Diagnosis not present

## 2019-06-27 DIAGNOSIS — N186 End stage renal disease: Secondary | ICD-10-CM | POA: Diagnosis not present

## 2019-06-28 DIAGNOSIS — N186 End stage renal disease: Secondary | ICD-10-CM | POA: Diagnosis not present

## 2019-06-29 DIAGNOSIS — N186 End stage renal disease: Secondary | ICD-10-CM | POA: Diagnosis not present

## 2019-06-30 DIAGNOSIS — N186 End stage renal disease: Secondary | ICD-10-CM | POA: Diagnosis not present

## 2019-07-01 DIAGNOSIS — Z992 Dependence on renal dialysis: Secondary | ICD-10-CM | POA: Diagnosis not present

## 2019-07-01 DIAGNOSIS — N186 End stage renal disease: Secondary | ICD-10-CM | POA: Diagnosis not present

## 2019-07-02 DIAGNOSIS — N186 End stage renal disease: Secondary | ICD-10-CM | POA: Diagnosis not present

## 2019-07-02 DIAGNOSIS — D509 Iron deficiency anemia, unspecified: Secondary | ICD-10-CM | POA: Diagnosis not present

## 2019-07-02 DIAGNOSIS — D631 Anemia in chronic kidney disease: Secondary | ICD-10-CM | POA: Diagnosis not present

## 2019-07-02 DIAGNOSIS — N2581 Secondary hyperparathyroidism of renal origin: Secondary | ICD-10-CM | POA: Diagnosis not present

## 2019-07-03 DIAGNOSIS — N2581 Secondary hyperparathyroidism of renal origin: Secondary | ICD-10-CM | POA: Diagnosis not present

## 2019-07-03 DIAGNOSIS — D631 Anemia in chronic kidney disease: Secondary | ICD-10-CM | POA: Diagnosis not present

## 2019-07-03 DIAGNOSIS — D509 Iron deficiency anemia, unspecified: Secondary | ICD-10-CM | POA: Diagnosis not present

## 2019-07-03 DIAGNOSIS — N186 End stage renal disease: Secondary | ICD-10-CM | POA: Diagnosis not present

## 2019-07-04 DIAGNOSIS — T7840XA Allergy, unspecified, initial encounter: Secondary | ICD-10-CM | POA: Diagnosis not present

## 2019-07-05 DIAGNOSIS — D509 Iron deficiency anemia, unspecified: Secondary | ICD-10-CM | POA: Diagnosis not present

## 2019-07-05 DIAGNOSIS — N186 End stage renal disease: Secondary | ICD-10-CM | POA: Diagnosis not present

## 2019-07-05 DIAGNOSIS — D631 Anemia in chronic kidney disease: Secondary | ICD-10-CM | POA: Diagnosis not present

## 2019-07-05 DIAGNOSIS — N2581 Secondary hyperparathyroidism of renal origin: Secondary | ICD-10-CM | POA: Diagnosis not present

## 2019-07-06 DIAGNOSIS — N186 End stage renal disease: Secondary | ICD-10-CM | POA: Diagnosis not present

## 2019-07-06 DIAGNOSIS — D631 Anemia in chronic kidney disease: Secondary | ICD-10-CM | POA: Diagnosis not present

## 2019-07-06 DIAGNOSIS — N2581 Secondary hyperparathyroidism of renal origin: Secondary | ICD-10-CM | POA: Diagnosis not present

## 2019-07-06 DIAGNOSIS — D509 Iron deficiency anemia, unspecified: Secondary | ICD-10-CM | POA: Diagnosis not present

## 2019-07-07 DIAGNOSIS — D509 Iron deficiency anemia, unspecified: Secondary | ICD-10-CM | POA: Diagnosis not present

## 2019-07-07 DIAGNOSIS — D631 Anemia in chronic kidney disease: Secondary | ICD-10-CM | POA: Diagnosis not present

## 2019-07-07 DIAGNOSIS — N2581 Secondary hyperparathyroidism of renal origin: Secondary | ICD-10-CM | POA: Diagnosis not present

## 2019-07-07 DIAGNOSIS — N186 End stage renal disease: Secondary | ICD-10-CM | POA: Diagnosis not present

## 2019-07-08 DIAGNOSIS — D631 Anemia in chronic kidney disease: Secondary | ICD-10-CM | POA: Diagnosis not present

## 2019-07-08 DIAGNOSIS — N2581 Secondary hyperparathyroidism of renal origin: Secondary | ICD-10-CM | POA: Diagnosis not present

## 2019-07-08 DIAGNOSIS — N186 End stage renal disease: Secondary | ICD-10-CM | POA: Diagnosis not present

## 2019-07-08 DIAGNOSIS — D509 Iron deficiency anemia, unspecified: Secondary | ICD-10-CM | POA: Diagnosis not present

## 2019-07-09 DIAGNOSIS — D509 Iron deficiency anemia, unspecified: Secondary | ICD-10-CM | POA: Diagnosis not present

## 2019-07-09 DIAGNOSIS — N186 End stage renal disease: Secondary | ICD-10-CM | POA: Diagnosis not present

## 2019-07-09 DIAGNOSIS — D631 Anemia in chronic kidney disease: Secondary | ICD-10-CM | POA: Diagnosis not present

## 2019-07-09 DIAGNOSIS — N2581 Secondary hyperparathyroidism of renal origin: Secondary | ICD-10-CM | POA: Diagnosis not present

## 2019-07-10 DIAGNOSIS — D509 Iron deficiency anemia, unspecified: Secondary | ICD-10-CM | POA: Diagnosis not present

## 2019-07-10 DIAGNOSIS — N2581 Secondary hyperparathyroidism of renal origin: Secondary | ICD-10-CM | POA: Diagnosis not present

## 2019-07-10 DIAGNOSIS — D631 Anemia in chronic kidney disease: Secondary | ICD-10-CM | POA: Diagnosis not present

## 2019-07-10 DIAGNOSIS — N186 End stage renal disease: Secondary | ICD-10-CM | POA: Diagnosis not present

## 2019-07-11 DIAGNOSIS — D509 Iron deficiency anemia, unspecified: Secondary | ICD-10-CM | POA: Diagnosis not present

## 2019-07-11 DIAGNOSIS — N186 End stage renal disease: Secondary | ICD-10-CM | POA: Diagnosis not present

## 2019-07-11 DIAGNOSIS — N2581 Secondary hyperparathyroidism of renal origin: Secondary | ICD-10-CM | POA: Diagnosis not present

## 2019-07-11 DIAGNOSIS — D631 Anemia in chronic kidney disease: Secondary | ICD-10-CM | POA: Diagnosis not present

## 2019-07-12 DIAGNOSIS — D509 Iron deficiency anemia, unspecified: Secondary | ICD-10-CM | POA: Diagnosis not present

## 2019-07-12 DIAGNOSIS — N186 End stage renal disease: Secondary | ICD-10-CM | POA: Diagnosis not present

## 2019-07-12 DIAGNOSIS — N2581 Secondary hyperparathyroidism of renal origin: Secondary | ICD-10-CM | POA: Diagnosis not present

## 2019-07-12 DIAGNOSIS — D631 Anemia in chronic kidney disease: Secondary | ICD-10-CM | POA: Diagnosis not present

## 2019-07-13 DIAGNOSIS — D631 Anemia in chronic kidney disease: Secondary | ICD-10-CM | POA: Diagnosis not present

## 2019-07-13 DIAGNOSIS — N186 End stage renal disease: Secondary | ICD-10-CM | POA: Diagnosis not present

## 2019-07-13 DIAGNOSIS — N2581 Secondary hyperparathyroidism of renal origin: Secondary | ICD-10-CM | POA: Diagnosis not present

## 2019-07-13 DIAGNOSIS — D509 Iron deficiency anemia, unspecified: Secondary | ICD-10-CM | POA: Diagnosis not present

## 2019-07-14 DIAGNOSIS — N186 End stage renal disease: Secondary | ICD-10-CM | POA: Diagnosis not present

## 2019-07-14 DIAGNOSIS — D509 Iron deficiency anemia, unspecified: Secondary | ICD-10-CM | POA: Diagnosis not present

## 2019-07-14 DIAGNOSIS — D631 Anemia in chronic kidney disease: Secondary | ICD-10-CM | POA: Diagnosis not present

## 2019-07-14 DIAGNOSIS — N2581 Secondary hyperparathyroidism of renal origin: Secondary | ICD-10-CM | POA: Diagnosis not present

## 2019-07-15 DIAGNOSIS — D509 Iron deficiency anemia, unspecified: Secondary | ICD-10-CM | POA: Diagnosis not present

## 2019-07-15 DIAGNOSIS — N186 End stage renal disease: Secondary | ICD-10-CM | POA: Diagnosis not present

## 2019-07-15 DIAGNOSIS — N2581 Secondary hyperparathyroidism of renal origin: Secondary | ICD-10-CM | POA: Diagnosis not present

## 2019-07-15 DIAGNOSIS — D631 Anemia in chronic kidney disease: Secondary | ICD-10-CM | POA: Diagnosis not present

## 2019-07-16 DIAGNOSIS — N186 End stage renal disease: Secondary | ICD-10-CM | POA: Diagnosis not present

## 2019-07-16 DIAGNOSIS — D509 Iron deficiency anemia, unspecified: Secondary | ICD-10-CM | POA: Diagnosis not present

## 2019-07-16 DIAGNOSIS — N2581 Secondary hyperparathyroidism of renal origin: Secondary | ICD-10-CM | POA: Diagnosis not present

## 2019-07-16 DIAGNOSIS — D631 Anemia in chronic kidney disease: Secondary | ICD-10-CM | POA: Diagnosis not present

## 2019-07-17 DIAGNOSIS — N2581 Secondary hyperparathyroidism of renal origin: Secondary | ICD-10-CM | POA: Diagnosis not present

## 2019-07-17 DIAGNOSIS — N186 End stage renal disease: Secondary | ICD-10-CM | POA: Diagnosis not present

## 2019-07-17 DIAGNOSIS — D509 Iron deficiency anemia, unspecified: Secondary | ICD-10-CM | POA: Diagnosis not present

## 2019-07-17 DIAGNOSIS — D631 Anemia in chronic kidney disease: Secondary | ICD-10-CM | POA: Diagnosis not present

## 2019-07-18 ENCOUNTER — Other Ambulatory Visit (HOSPITAL_COMMUNITY): Payer: Self-pay | Admitting: Psychiatry

## 2019-07-18 DIAGNOSIS — N2581 Secondary hyperparathyroidism of renal origin: Secondary | ICD-10-CM | POA: Diagnosis not present

## 2019-07-18 DIAGNOSIS — D509 Iron deficiency anemia, unspecified: Secondary | ICD-10-CM | POA: Diagnosis not present

## 2019-07-18 DIAGNOSIS — N186 End stage renal disease: Secondary | ICD-10-CM | POA: Diagnosis not present

## 2019-07-18 DIAGNOSIS — D631 Anemia in chronic kidney disease: Secondary | ICD-10-CM | POA: Diagnosis not present

## 2019-07-19 DIAGNOSIS — D631 Anemia in chronic kidney disease: Secondary | ICD-10-CM | POA: Diagnosis not present

## 2019-07-19 DIAGNOSIS — N2581 Secondary hyperparathyroidism of renal origin: Secondary | ICD-10-CM | POA: Diagnosis not present

## 2019-07-19 DIAGNOSIS — D509 Iron deficiency anemia, unspecified: Secondary | ICD-10-CM | POA: Diagnosis not present

## 2019-07-19 DIAGNOSIS — N186 End stage renal disease: Secondary | ICD-10-CM | POA: Diagnosis not present

## 2019-07-20 DIAGNOSIS — N186 End stage renal disease: Secondary | ICD-10-CM | POA: Diagnosis not present

## 2019-07-20 DIAGNOSIS — D631 Anemia in chronic kidney disease: Secondary | ICD-10-CM | POA: Diagnosis not present

## 2019-07-20 DIAGNOSIS — N2581 Secondary hyperparathyroidism of renal origin: Secondary | ICD-10-CM | POA: Diagnosis not present

## 2019-07-20 DIAGNOSIS — D509 Iron deficiency anemia, unspecified: Secondary | ICD-10-CM | POA: Diagnosis not present

## 2019-07-21 DIAGNOSIS — N2581 Secondary hyperparathyroidism of renal origin: Secondary | ICD-10-CM | POA: Diagnosis not present

## 2019-07-21 DIAGNOSIS — D631 Anemia in chronic kidney disease: Secondary | ICD-10-CM | POA: Diagnosis not present

## 2019-07-21 DIAGNOSIS — D509 Iron deficiency anemia, unspecified: Secondary | ICD-10-CM | POA: Diagnosis not present

## 2019-07-21 DIAGNOSIS — N186 End stage renal disease: Secondary | ICD-10-CM | POA: Diagnosis not present

## 2019-07-22 DIAGNOSIS — D509 Iron deficiency anemia, unspecified: Secondary | ICD-10-CM | POA: Diagnosis not present

## 2019-07-22 DIAGNOSIS — D631 Anemia in chronic kidney disease: Secondary | ICD-10-CM | POA: Diagnosis not present

## 2019-07-22 DIAGNOSIS — N186 End stage renal disease: Secondary | ICD-10-CM | POA: Diagnosis not present

## 2019-07-22 DIAGNOSIS — N2581 Secondary hyperparathyroidism of renal origin: Secondary | ICD-10-CM | POA: Diagnosis not present

## 2019-07-23 DIAGNOSIS — N186 End stage renal disease: Secondary | ICD-10-CM | POA: Diagnosis not present

## 2019-07-23 DIAGNOSIS — N2581 Secondary hyperparathyroidism of renal origin: Secondary | ICD-10-CM | POA: Diagnosis not present

## 2019-07-23 DIAGNOSIS — D509 Iron deficiency anemia, unspecified: Secondary | ICD-10-CM | POA: Diagnosis not present

## 2019-07-23 DIAGNOSIS — D631 Anemia in chronic kidney disease: Secondary | ICD-10-CM | POA: Diagnosis not present

## 2019-07-24 DIAGNOSIS — D509 Iron deficiency anemia, unspecified: Secondary | ICD-10-CM | POA: Diagnosis not present

## 2019-07-24 DIAGNOSIS — N2581 Secondary hyperparathyroidism of renal origin: Secondary | ICD-10-CM | POA: Diagnosis not present

## 2019-07-24 DIAGNOSIS — N186 End stage renal disease: Secondary | ICD-10-CM | POA: Diagnosis not present

## 2019-07-24 DIAGNOSIS — D631 Anemia in chronic kidney disease: Secondary | ICD-10-CM | POA: Diagnosis not present

## 2019-07-25 DIAGNOSIS — N186 End stage renal disease: Secondary | ICD-10-CM | POA: Diagnosis not present

## 2019-07-25 DIAGNOSIS — D631 Anemia in chronic kidney disease: Secondary | ICD-10-CM | POA: Diagnosis not present

## 2019-07-25 DIAGNOSIS — N2581 Secondary hyperparathyroidism of renal origin: Secondary | ICD-10-CM | POA: Diagnosis not present

## 2019-07-25 DIAGNOSIS — D509 Iron deficiency anemia, unspecified: Secondary | ICD-10-CM | POA: Diagnosis not present

## 2019-07-26 DIAGNOSIS — D509 Iron deficiency anemia, unspecified: Secondary | ICD-10-CM | POA: Diagnosis not present

## 2019-07-26 DIAGNOSIS — N2581 Secondary hyperparathyroidism of renal origin: Secondary | ICD-10-CM | POA: Diagnosis not present

## 2019-07-26 DIAGNOSIS — D631 Anemia in chronic kidney disease: Secondary | ICD-10-CM | POA: Diagnosis not present

## 2019-07-26 DIAGNOSIS — N186 End stage renal disease: Secondary | ICD-10-CM | POA: Diagnosis not present

## 2019-07-27 DIAGNOSIS — D631 Anemia in chronic kidney disease: Secondary | ICD-10-CM | POA: Diagnosis not present

## 2019-07-27 DIAGNOSIS — N2581 Secondary hyperparathyroidism of renal origin: Secondary | ICD-10-CM | POA: Diagnosis not present

## 2019-07-27 DIAGNOSIS — D509 Iron deficiency anemia, unspecified: Secondary | ICD-10-CM | POA: Diagnosis not present

## 2019-07-27 DIAGNOSIS — N186 End stage renal disease: Secondary | ICD-10-CM | POA: Diagnosis not present

## 2019-07-28 DIAGNOSIS — N186 End stage renal disease: Secondary | ICD-10-CM | POA: Diagnosis not present

## 2019-07-28 DIAGNOSIS — D631 Anemia in chronic kidney disease: Secondary | ICD-10-CM | POA: Diagnosis not present

## 2019-07-28 DIAGNOSIS — N2581 Secondary hyperparathyroidism of renal origin: Secondary | ICD-10-CM | POA: Diagnosis not present

## 2019-07-28 DIAGNOSIS — D509 Iron deficiency anemia, unspecified: Secondary | ICD-10-CM | POA: Diagnosis not present

## 2019-07-29 DIAGNOSIS — D509 Iron deficiency anemia, unspecified: Secondary | ICD-10-CM | POA: Diagnosis not present

## 2019-07-29 DIAGNOSIS — N186 End stage renal disease: Secondary | ICD-10-CM | POA: Diagnosis not present

## 2019-07-29 DIAGNOSIS — N2581 Secondary hyperparathyroidism of renal origin: Secondary | ICD-10-CM | POA: Diagnosis not present

## 2019-07-29 DIAGNOSIS — D631 Anemia in chronic kidney disease: Secondary | ICD-10-CM | POA: Diagnosis not present

## 2019-07-30 DIAGNOSIS — N186 End stage renal disease: Secondary | ICD-10-CM | POA: Diagnosis not present

## 2019-07-30 DIAGNOSIS — N2581 Secondary hyperparathyroidism of renal origin: Secondary | ICD-10-CM | POA: Diagnosis not present

## 2019-07-30 DIAGNOSIS — D631 Anemia in chronic kidney disease: Secondary | ICD-10-CM | POA: Diagnosis not present

## 2019-07-30 DIAGNOSIS — D509 Iron deficiency anemia, unspecified: Secondary | ICD-10-CM | POA: Diagnosis not present

## 2019-07-31 DIAGNOSIS — N2581 Secondary hyperparathyroidism of renal origin: Secondary | ICD-10-CM | POA: Diagnosis not present

## 2019-07-31 DIAGNOSIS — Z992 Dependence on renal dialysis: Secondary | ICD-10-CM | POA: Diagnosis not present

## 2019-07-31 DIAGNOSIS — D509 Iron deficiency anemia, unspecified: Secondary | ICD-10-CM | POA: Diagnosis not present

## 2019-07-31 DIAGNOSIS — N186 End stage renal disease: Secondary | ICD-10-CM | POA: Diagnosis not present

## 2019-07-31 DIAGNOSIS — D631 Anemia in chronic kidney disease: Secondary | ICD-10-CM | POA: Diagnosis not present

## 2019-08-01 DIAGNOSIS — N186 End stage renal disease: Secondary | ICD-10-CM | POA: Diagnosis not present

## 2019-08-01 DIAGNOSIS — N2581 Secondary hyperparathyroidism of renal origin: Secondary | ICD-10-CM | POA: Diagnosis not present

## 2019-08-01 DIAGNOSIS — D631 Anemia in chronic kidney disease: Secondary | ICD-10-CM | POA: Diagnosis not present

## 2019-08-01 DIAGNOSIS — D509 Iron deficiency anemia, unspecified: Secondary | ICD-10-CM | POA: Diagnosis not present

## 2019-08-02 DIAGNOSIS — D631 Anemia in chronic kidney disease: Secondary | ICD-10-CM | POA: Diagnosis not present

## 2019-08-02 DIAGNOSIS — N186 End stage renal disease: Secondary | ICD-10-CM | POA: Diagnosis not present

## 2019-08-02 DIAGNOSIS — N2581 Secondary hyperparathyroidism of renal origin: Secondary | ICD-10-CM | POA: Diagnosis not present

## 2019-08-02 DIAGNOSIS — D509 Iron deficiency anemia, unspecified: Secondary | ICD-10-CM | POA: Diagnosis not present

## 2019-08-03 DIAGNOSIS — N186 End stage renal disease: Secondary | ICD-10-CM | POA: Diagnosis not present

## 2019-08-03 DIAGNOSIS — N2581 Secondary hyperparathyroidism of renal origin: Secondary | ICD-10-CM | POA: Diagnosis not present

## 2019-08-03 DIAGNOSIS — D631 Anemia in chronic kidney disease: Secondary | ICD-10-CM | POA: Diagnosis not present

## 2019-08-03 DIAGNOSIS — D509 Iron deficiency anemia, unspecified: Secondary | ICD-10-CM | POA: Diagnosis not present

## 2019-08-04 DIAGNOSIS — D509 Iron deficiency anemia, unspecified: Secondary | ICD-10-CM | POA: Diagnosis not present

## 2019-08-04 DIAGNOSIS — D631 Anemia in chronic kidney disease: Secondary | ICD-10-CM | POA: Diagnosis not present

## 2019-08-04 DIAGNOSIS — N186 End stage renal disease: Secondary | ICD-10-CM | POA: Diagnosis not present

## 2019-08-04 DIAGNOSIS — N2581 Secondary hyperparathyroidism of renal origin: Secondary | ICD-10-CM | POA: Diagnosis not present

## 2019-08-05 DIAGNOSIS — N2581 Secondary hyperparathyroidism of renal origin: Secondary | ICD-10-CM | POA: Diagnosis not present

## 2019-08-05 DIAGNOSIS — D509 Iron deficiency anemia, unspecified: Secondary | ICD-10-CM | POA: Diagnosis not present

## 2019-08-05 DIAGNOSIS — N186 End stage renal disease: Secondary | ICD-10-CM | POA: Diagnosis not present

## 2019-08-05 DIAGNOSIS — D631 Anemia in chronic kidney disease: Secondary | ICD-10-CM | POA: Diagnosis not present

## 2019-08-06 DIAGNOSIS — N2581 Secondary hyperparathyroidism of renal origin: Secondary | ICD-10-CM | POA: Diagnosis not present

## 2019-08-06 DIAGNOSIS — N186 End stage renal disease: Secondary | ICD-10-CM | POA: Diagnosis not present

## 2019-08-06 DIAGNOSIS — D631 Anemia in chronic kidney disease: Secondary | ICD-10-CM | POA: Diagnosis not present

## 2019-08-06 DIAGNOSIS — D509 Iron deficiency anemia, unspecified: Secondary | ICD-10-CM | POA: Diagnosis not present

## 2019-08-07 DIAGNOSIS — N186 End stage renal disease: Secondary | ICD-10-CM | POA: Diagnosis not present

## 2019-08-07 DIAGNOSIS — N2581 Secondary hyperparathyroidism of renal origin: Secondary | ICD-10-CM | POA: Diagnosis not present

## 2019-08-07 DIAGNOSIS — D631 Anemia in chronic kidney disease: Secondary | ICD-10-CM | POA: Diagnosis not present

## 2019-08-07 DIAGNOSIS — D509 Iron deficiency anemia, unspecified: Secondary | ICD-10-CM | POA: Diagnosis not present

## 2019-08-08 DIAGNOSIS — N186 End stage renal disease: Secondary | ICD-10-CM | POA: Diagnosis not present

## 2019-08-08 DIAGNOSIS — D509 Iron deficiency anemia, unspecified: Secondary | ICD-10-CM | POA: Diagnosis not present

## 2019-08-08 DIAGNOSIS — D631 Anemia in chronic kidney disease: Secondary | ICD-10-CM | POA: Diagnosis not present

## 2019-08-08 DIAGNOSIS — N2581 Secondary hyperparathyroidism of renal origin: Secondary | ICD-10-CM | POA: Diagnosis not present

## 2019-08-09 DIAGNOSIS — N186 End stage renal disease: Secondary | ICD-10-CM | POA: Diagnosis not present

## 2019-08-09 DIAGNOSIS — D509 Iron deficiency anemia, unspecified: Secondary | ICD-10-CM | POA: Diagnosis not present

## 2019-08-09 DIAGNOSIS — D631 Anemia in chronic kidney disease: Secondary | ICD-10-CM | POA: Diagnosis not present

## 2019-08-09 DIAGNOSIS — N2581 Secondary hyperparathyroidism of renal origin: Secondary | ICD-10-CM | POA: Diagnosis not present

## 2019-08-10 DIAGNOSIS — J309 Allergic rhinitis, unspecified: Secondary | ICD-10-CM | POA: Diagnosis not present

## 2019-08-10 DIAGNOSIS — N2581 Secondary hyperparathyroidism of renal origin: Secondary | ICD-10-CM | POA: Diagnosis not present

## 2019-08-10 DIAGNOSIS — N186 End stage renal disease: Secondary | ICD-10-CM | POA: Diagnosis not present

## 2019-08-10 DIAGNOSIS — D631 Anemia in chronic kidney disease: Secondary | ICD-10-CM | POA: Diagnosis not present

## 2019-08-10 DIAGNOSIS — J300 Vasomotor rhinitis: Secondary | ICD-10-CM | POA: Diagnosis not present

## 2019-08-10 DIAGNOSIS — L501 Idiopathic urticaria: Secondary | ICD-10-CM | POA: Diagnosis not present

## 2019-08-10 DIAGNOSIS — D509 Iron deficiency anemia, unspecified: Secondary | ICD-10-CM | POA: Diagnosis not present

## 2019-08-11 DIAGNOSIS — D509 Iron deficiency anemia, unspecified: Secondary | ICD-10-CM | POA: Diagnosis not present

## 2019-08-11 DIAGNOSIS — N2581 Secondary hyperparathyroidism of renal origin: Secondary | ICD-10-CM | POA: Diagnosis not present

## 2019-08-11 DIAGNOSIS — N186 End stage renal disease: Secondary | ICD-10-CM | POA: Diagnosis not present

## 2019-08-11 DIAGNOSIS — D631 Anemia in chronic kidney disease: Secondary | ICD-10-CM | POA: Diagnosis not present

## 2019-08-12 DIAGNOSIS — D509 Iron deficiency anemia, unspecified: Secondary | ICD-10-CM | POA: Diagnosis not present

## 2019-08-12 DIAGNOSIS — D631 Anemia in chronic kidney disease: Secondary | ICD-10-CM | POA: Diagnosis not present

## 2019-08-12 DIAGNOSIS — N2581 Secondary hyperparathyroidism of renal origin: Secondary | ICD-10-CM | POA: Diagnosis not present

## 2019-08-12 DIAGNOSIS — N186 End stage renal disease: Secondary | ICD-10-CM | POA: Diagnosis not present

## 2019-08-13 DIAGNOSIS — N2581 Secondary hyperparathyroidism of renal origin: Secondary | ICD-10-CM | POA: Diagnosis not present

## 2019-08-13 DIAGNOSIS — D509 Iron deficiency anemia, unspecified: Secondary | ICD-10-CM | POA: Diagnosis not present

## 2019-08-13 DIAGNOSIS — N186 End stage renal disease: Secondary | ICD-10-CM | POA: Diagnosis not present

## 2019-08-13 DIAGNOSIS — L233 Allergic contact dermatitis due to drugs in contact with skin: Secondary | ICD-10-CM | POA: Diagnosis not present

## 2019-08-13 DIAGNOSIS — D631 Anemia in chronic kidney disease: Secondary | ICD-10-CM | POA: Diagnosis not present

## 2019-08-13 DIAGNOSIS — L509 Urticaria, unspecified: Secondary | ICD-10-CM | POA: Diagnosis not present

## 2019-08-14 DIAGNOSIS — D631 Anemia in chronic kidney disease: Secondary | ICD-10-CM | POA: Diagnosis not present

## 2019-08-14 DIAGNOSIS — N186 End stage renal disease: Secondary | ICD-10-CM | POA: Diagnosis not present

## 2019-08-14 DIAGNOSIS — N2581 Secondary hyperparathyroidism of renal origin: Secondary | ICD-10-CM | POA: Diagnosis not present

## 2019-08-14 DIAGNOSIS — D509 Iron deficiency anemia, unspecified: Secondary | ICD-10-CM | POA: Diagnosis not present

## 2019-08-15 DIAGNOSIS — N186 End stage renal disease: Secondary | ICD-10-CM | POA: Diagnosis not present

## 2019-08-15 DIAGNOSIS — D509 Iron deficiency anemia, unspecified: Secondary | ICD-10-CM | POA: Diagnosis not present

## 2019-08-15 DIAGNOSIS — N2581 Secondary hyperparathyroidism of renal origin: Secondary | ICD-10-CM | POA: Diagnosis not present

## 2019-08-15 DIAGNOSIS — D631 Anemia in chronic kidney disease: Secondary | ICD-10-CM | POA: Diagnosis not present

## 2019-08-16 DIAGNOSIS — D631 Anemia in chronic kidney disease: Secondary | ICD-10-CM | POA: Diagnosis not present

## 2019-08-16 DIAGNOSIS — D509 Iron deficiency anemia, unspecified: Secondary | ICD-10-CM | POA: Diagnosis not present

## 2019-08-16 DIAGNOSIS — N186 End stage renal disease: Secondary | ICD-10-CM | POA: Diagnosis not present

## 2019-08-16 DIAGNOSIS — N2581 Secondary hyperparathyroidism of renal origin: Secondary | ICD-10-CM | POA: Diagnosis not present

## 2019-08-17 DIAGNOSIS — D509 Iron deficiency anemia, unspecified: Secondary | ICD-10-CM | POA: Diagnosis not present

## 2019-08-17 DIAGNOSIS — C73 Malignant neoplasm of thyroid gland: Secondary | ICD-10-CM | POA: Diagnosis not present

## 2019-08-17 DIAGNOSIS — N186 End stage renal disease: Secondary | ICD-10-CM | POA: Diagnosis not present

## 2019-08-17 DIAGNOSIS — D631 Anemia in chronic kidney disease: Secondary | ICD-10-CM | POA: Diagnosis not present

## 2019-08-17 DIAGNOSIS — N2581 Secondary hyperparathyroidism of renal origin: Secondary | ICD-10-CM | POA: Diagnosis not present

## 2019-08-17 DIAGNOSIS — E890 Postprocedural hypothyroidism: Secondary | ICD-10-CM | POA: Diagnosis not present

## 2019-08-18 DIAGNOSIS — D631 Anemia in chronic kidney disease: Secondary | ICD-10-CM | POA: Diagnosis not present

## 2019-08-18 DIAGNOSIS — N186 End stage renal disease: Secondary | ICD-10-CM | POA: Diagnosis not present

## 2019-08-18 DIAGNOSIS — D509 Iron deficiency anemia, unspecified: Secondary | ICD-10-CM | POA: Diagnosis not present

## 2019-08-18 DIAGNOSIS — N2581 Secondary hyperparathyroidism of renal origin: Secondary | ICD-10-CM | POA: Diagnosis not present

## 2019-08-19 DIAGNOSIS — N2581 Secondary hyperparathyroidism of renal origin: Secondary | ICD-10-CM | POA: Diagnosis not present

## 2019-08-19 DIAGNOSIS — D631 Anemia in chronic kidney disease: Secondary | ICD-10-CM | POA: Diagnosis not present

## 2019-08-19 DIAGNOSIS — D509 Iron deficiency anemia, unspecified: Secondary | ICD-10-CM | POA: Diagnosis not present

## 2019-08-19 DIAGNOSIS — N186 End stage renal disease: Secondary | ICD-10-CM | POA: Diagnosis not present

## 2019-08-20 DIAGNOSIS — N186 End stage renal disease: Secondary | ICD-10-CM | POA: Diagnosis not present

## 2019-08-20 DIAGNOSIS — D631 Anemia in chronic kidney disease: Secondary | ICD-10-CM | POA: Diagnosis not present

## 2019-08-20 DIAGNOSIS — N2581 Secondary hyperparathyroidism of renal origin: Secondary | ICD-10-CM | POA: Diagnosis not present

## 2019-08-20 DIAGNOSIS — D509 Iron deficiency anemia, unspecified: Secondary | ICD-10-CM | POA: Diagnosis not present

## 2019-08-21 ENCOUNTER — Other Ambulatory Visit (HOSPITAL_COMMUNITY): Payer: Self-pay | Admitting: Psychiatry

## 2019-08-21 ENCOUNTER — Telehealth (HOSPITAL_COMMUNITY): Payer: Self-pay | Admitting: *Deleted

## 2019-08-21 DIAGNOSIS — N186 End stage renal disease: Secondary | ICD-10-CM | POA: Diagnosis not present

## 2019-08-21 DIAGNOSIS — D509 Iron deficiency anemia, unspecified: Secondary | ICD-10-CM | POA: Diagnosis not present

## 2019-08-21 DIAGNOSIS — D631 Anemia in chronic kidney disease: Secondary | ICD-10-CM | POA: Diagnosis not present

## 2019-08-21 DIAGNOSIS — N2581 Secondary hyperparathyroidism of renal origin: Secondary | ICD-10-CM | POA: Diagnosis not present

## 2019-08-21 MED ORDER — ALPRAZOLAM 1 MG PO TABS: 1.0000 mg | ORAL_TABLET | Freq: Four times a day (QID) | ORAL | 2 refills | Status: DC

## 2019-08-21 NOTE — Telephone Encounter (Signed)
Patient called requested refill ALPRAZolam (XANAX) 1 MG tablet

## 2019-08-21 NOTE — Telephone Encounter (Signed)
Sent to [facility] drug

## 2019-08-22 DIAGNOSIS — D509 Iron deficiency anemia, unspecified: Secondary | ICD-10-CM | POA: Diagnosis not present

## 2019-08-22 DIAGNOSIS — D631 Anemia in chronic kidney disease: Secondary | ICD-10-CM | POA: Diagnosis not present

## 2019-08-22 DIAGNOSIS — N2581 Secondary hyperparathyroidism of renal origin: Secondary | ICD-10-CM | POA: Diagnosis not present

## 2019-08-22 DIAGNOSIS — N186 End stage renal disease: Secondary | ICD-10-CM | POA: Diagnosis not present

## 2019-08-23 DIAGNOSIS — D509 Iron deficiency anemia, unspecified: Secondary | ICD-10-CM | POA: Diagnosis not present

## 2019-08-23 DIAGNOSIS — N2581 Secondary hyperparathyroidism of renal origin: Secondary | ICD-10-CM | POA: Diagnosis not present

## 2019-08-23 DIAGNOSIS — D631 Anemia in chronic kidney disease: Secondary | ICD-10-CM | POA: Diagnosis not present

## 2019-08-23 DIAGNOSIS — N186 End stage renal disease: Secondary | ICD-10-CM | POA: Diagnosis not present

## 2019-08-24 DIAGNOSIS — D509 Iron deficiency anemia, unspecified: Secondary | ICD-10-CM | POA: Diagnosis not present

## 2019-08-24 DIAGNOSIS — D631 Anemia in chronic kidney disease: Secondary | ICD-10-CM | POA: Diagnosis not present

## 2019-08-24 DIAGNOSIS — N2581 Secondary hyperparathyroidism of renal origin: Secondary | ICD-10-CM | POA: Diagnosis not present

## 2019-08-24 DIAGNOSIS — N186 End stage renal disease: Secondary | ICD-10-CM | POA: Diagnosis not present

## 2019-08-25 ENCOUNTER — Telehealth (INDEPENDENT_AMBULATORY_CARE_PROVIDER_SITE_OTHER): Payer: Medicare PPO | Admitting: Psychiatry

## 2019-08-25 ENCOUNTER — Other Ambulatory Visit: Payer: Self-pay

## 2019-08-25 ENCOUNTER — Encounter (HOSPITAL_COMMUNITY): Payer: Self-pay | Admitting: Psychiatry

## 2019-08-25 DIAGNOSIS — N186 End stage renal disease: Secondary | ICD-10-CM | POA: Diagnosis not present

## 2019-08-25 DIAGNOSIS — F322 Major depressive disorder, single episode, severe without psychotic features: Secondary | ICD-10-CM | POA: Diagnosis not present

## 2019-08-25 DIAGNOSIS — D631 Anemia in chronic kidney disease: Secondary | ICD-10-CM | POA: Diagnosis not present

## 2019-08-25 DIAGNOSIS — D509 Iron deficiency anemia, unspecified: Secondary | ICD-10-CM | POA: Diagnosis not present

## 2019-08-25 DIAGNOSIS — F411 Generalized anxiety disorder: Secondary | ICD-10-CM | POA: Diagnosis not present

## 2019-08-25 DIAGNOSIS — N2581 Secondary hyperparathyroidism of renal origin: Secondary | ICD-10-CM | POA: Diagnosis not present

## 2019-08-25 MED ORDER — PRAZOSIN HCL 5 MG PO CAPS: 5.0000 mg | ORAL_CAPSULE | Freq: Every day | ORAL | 2 refills | Status: DC

## 2019-08-25 MED ORDER — BUPROPION HCL ER (XL) 300 MG PO TB24: 300.0000 mg | ORAL_TABLET | ORAL | 2 refills | Status: DC

## 2019-08-25 MED ORDER — SERTRALINE HCL 100 MG PO TABS: 100.0000 mg | ORAL_TABLET | Freq: Every day | ORAL | 2 refills | Status: DC

## 2019-08-25 MED ORDER — ALPRAZOLAM 1 MG PO TABS: 1.0000 mg | ORAL_TABLET | Freq: Four times a day (QID) | ORAL | 2 refills | Status: DC

## 2019-08-25 NOTE — Progress Notes (Addendum)
Virtual Visit via Video Note  I connected with April Ward on 08/25/19 at  9:20 AM EDT by a video enabled telemedicine application and verified that I am speaking with the correct person using two identifiers.   I discussed the limitations of evaluation and management by telemedicine and the availability of in person appointments. The patient expressed understanding and agreed to proceed.    I discussed the assessment and treatment plan with the patient. The patient was provided an opportunity to ask questions and all were answered. The patient agreed with the plan and demonstrated an understanding of the instructions.   The patient was advised to call back or seek an in-person evaluation if the symptoms worsen or if the condition fails to improve as anticipated.  I provided 15 minutes of non-face-to-face time during this encounter. Location: Provider office, patient home  April Spiller, MD  [facility] MD/PA/NP OP Progress Note  08/25/2019 9:38 AM [name]  MRN:  [id number]  Chief Complaint:  Chief Complaint    Depression; Anxiety     HPI: This patient is a 59 year old widowed black female who lives with her son in [place]. 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  The patient returns for follow-up after 2 months. She states for the most part she is doing better. She is grateful that the weather is warmed up and she can go out walking and also work in her garden. Unfortunately she has pretty bad seasonal allergies. She has also gone back to the gym. She has gotten her coronavirus vaccine. She is no longer having the severe depression or crying spells and she denies nightmares. We tried Belsomra for her sleep but it caused a severe panic attack so she stopped it. She has tried numerous other sleeping pills which did not work. I suggested she try melatonin. She is still on peritoneal dialysis but is on 2 transplant team waiting list. Visit Diagnosis:    ICD-10-CM    1. Major depressive disorder, single episode, severe without psychotic features ([place])  F32.2   2. GAD (generalized anxiety disorder)  F41.1     Past Psychiatric History: Long-term outpatient treatment  Past Medical History:  Past Medical History:  Diagnosis Date  . Anxiety   . Arthritis   . Depression   . Diabetes mellitus without complication ([place])   . GERD (gastroesophageal reflux disease)   . Gout   . Heart murmur   . Hypertension   . Renal cancer ([place])   . Renal disease 10/2012  . Renal insufficiency     Past Surgical History:  Procedure Laterality Date  . CESAREAN SECTION  [id number]  . CHONDROPLASTY Right 08/22/2012   Procedure: CHONDROPLASTY;  Surgeon: Carole Civil, MD;  Location: [facility];  Service: Orthopedics;  Laterality: Right;  . COLONOSCOPY WITH PROPOFOL N/A 03/16/2014   Procedure: COLONOSCOPY WITH PROPOFOL (at cecum 1023, total withdrawal time=9 minutes);  Surgeon: Danie Binder, MD;  Location: [facility];  Service: Endoscopy;  Laterality: N/A;  . GASTRIC BYPASS    . KNEE ARTHROSCOPY WITH MEDIAL MENISECTOMY Right 08/22/2012   Procedure: KNEE ARTHROSCOPY WITH PARTIAL MEDIAL MENISECTOMY;  Surgeon: Carole Civil, MD;  Location: [facility];  Service: Orthopedics;  Laterality: Right;  . PARTIAL NEPHRECTOMY  Dec 2014   left  . TENDON REPAIR      Family Psychiatric History: See below  Family History:  Family History  Problem Relation Age of Onset  . Heart  attack Mother   . Heart attack Father   . Depression Paternal Aunt   . Alcohol abuse Maternal Uncle   . Colon cancer Maternal Aunt   . Colon cancer Maternal Uncle     Social History:  Social History   Socioeconomic History  . Marital status: Widowed    Spouse name: Not on file  . Number of children: Not on file  . Years of education: Not on file  . Highest education level: Not on file  Occupational History  . Occupation: Visual merchandiser: [employer]  Tobacco Use  . Smoking  status: Never Smoker  . Smokeless tobacco: Never Used  Substance and Sexual Activity  . Alcohol use: No    Alcohol/week: 0.0 standard drinks  . Drug use: No  . Sexual activity: Not on file  Other Topics Concern  . Not on file  Social History Narrative  . Not on file   Social Determinants of Health   Financial Resource Strain:   . Difficulty of Paying Living Expenses:   Food Insecurity:   . Worried About Charity fundraiser in the Last Year:   . Arboriculturist in the Last Year:   Transportation Needs:   . Film/video editor (Medical):   Marland Kitchen Lack of Transportation (Non-Medical):   Physical Activity:   . Days of Exercise per Week:   . Minutes of Exercise per Session:   Stress:   . Feeling of Stress :   Social Connections:   . Frequency of Communication with Friends and Family:   . Frequency of Social Gatherings with Friends and Family:   . Attends Religious Services:   . Active Member of Clubs or Organizations:   . Attends Archivist Meetings:   Marland Kitchen Marital Status:     Allergies:  Allergies  Allergen Reactions  . Skelaxin [Metaxalone] Hives and Rash    Metabolic Disorder Labs: No results found for: HGBA1C, MPG No results found for: PROLACTIN Lab Results  Component Value Date   CHOL 215 (H) 03/11/2014   TRIG 288 (H) 03/11/2014   HDL 33 (L) 03/11/2014   CHOLHDL 6.5 03/11/2014   VLDL 58 (H) 03/11/2014   LDLCALC 124 (H) 03/11/2014   No results found for: TSH  Therapeutic Level Labs: No results found for: LITHIUM No results found for: VALPROATE No components found for:  CBMZ  Current Medications: Current Outpatient Medications  Medication Sig Dispense Refill  . ALPRAZolam (XANAX) 1 MG tablet Take 1 tablet (1 mg total) by mouth 4 (four) times daily. 120 tablet 2  . amLODipine (NORVASC) 10 MG tablet Take 1 tablet by mouth daily.    Marland Kitchen buPROPion (WELLBUTRIN XL) 300 MG 24 hr tablet Take 1 tablet (300 mg total) by mouth every morning. 90 tablet 2  .  calcitRIOL (ROCALTROL) 0.25 MCG capsule Take 1 capsule by mouth 3 (three) times a week. M-W-F    . hydrALAZINE (APRESOLINE) 100 MG tablet TAKE 1 TABLET THREE TIMES DAILY 270 tablet 1  . lactulose (CONSTULOSE) 10 GM/15ML solution Take 10 mLs by mouth 2 (two) times daily as needed for constipation.    Marland Kitchen levothyroxine (SYNTHROID, LEVOTHROID) 200 MCG tablet Take 1 tablet by mouth daily.    Marland Kitchen losartan (COZAAR) 50 MG tablet TAKE 1 TABLET (50 MG TOTAL) BY MOUTH DAILY. 90 tablet 1  . meclizine (ANTIVERT) 25 MG tablet Take 1 tablet (25 mg total) by mouth every 8 (eight) hours as needed for dizziness.  90 tablet 0  . meloxicam (MOBIC) 7.5 MG tablet Take 1 tablet (7.5 mg total) by mouth daily. 30 tablet 5  . multivitamin (RENA-VIT) TABS tablet Take 1 tablet by mouth daily.    . prazosin (MINIPRESS) 5 MG capsule Take 1 capsule (5 mg total) by mouth at bedtime. 90 capsule 2  . sertraline (ZOLOFT) 100 MG tablet Take 1 tablet (100 mg total) by mouth daily. 90 tablet 2  . sevelamer carbonate (RENVELA) 800 MG tablet Take 800 mg by mouth daily.     Marland Kitchen tiZANidine (ZANAFLEX) 4 MG tablet Take 1 tablet (4 mg total) by mouth daily. 30 tablet 1  . vitamin B-12 (CYANOCOBALAMIN) 500 MCG tablet Take 500 mcg by mouth daily.     No current facility-administered medications for this visit.     Musculoskeletal: Strength & Muscle Tone: within normal limits Gait & Station: normal Patient leans: N/A  Psychiatric Specialty Exam: Review of Systems  HENT: Positive for congestion, postnasal drip and sneezing.   Psychiatric/Behavioral: Positive for sleep disturbance.  All other systems reviewed and are negative.   There were no vitals taken for this visit.There is no height or weight on file to calculate BMI.  General Appearance: Casual, Neat and Well Groomed  Eye Contact:  Good  Speech:  Clear and Coherent  Volume:  Normal  Mood:  Euthymic  Affect:  Appropriate and Congruent  Thought Process:  Goal Directed   Orientation:  Full (Time, Place, and Person)  Thought Content: WDL   Suicidal Thoughts:  No  Homicidal Thoughts:  No  Memory:  Immediate;   Good Recent;   Good Remote;   Fair  Judgement:  Good  Insight:  Good  Psychomotor Activity:  Normal  Concentration:  Concentration: Good and Attention Span: Good  Recall:  Good  Fund of Knowledge: Good  Language: Good  Akathisia:  No  Handed:  Right  AIMS (if indicated): not done  Assets:  Communication Skills Desire for Improvement Resilience Social Support Talents/Skills  ADL's:  Intact  Cognition: WNL  Sleep:  Fair   Screenings:   Assessment and Plan:  This patient is a 59 year old female with a history of end-stage renal disease depression and anxiety. She seems to be doing somewhat better now that she is getting out more. She will continue Zoloft 100 mg daily for depression as well as Wellbutrin XL 300 mg daily for depression. She will continue prazosin 5 mg at bedtime to prevent nightmares and also continue Xanax 1 mg up to 4 times daily for anxiety. She will return to see me in 3 months  April Spiller, MD 08/25/2019, 9:38 AM

## 2019-08-26 DIAGNOSIS — D631 Anemia in chronic kidney disease: Secondary | ICD-10-CM | POA: Diagnosis not present

## 2019-08-26 DIAGNOSIS — D509 Iron deficiency anemia, unspecified: Secondary | ICD-10-CM | POA: Diagnosis not present

## 2019-08-26 DIAGNOSIS — N2581 Secondary hyperparathyroidism of renal origin: Secondary | ICD-10-CM | POA: Diagnosis not present

## 2019-08-26 DIAGNOSIS — N186 End stage renal disease: Secondary | ICD-10-CM | POA: Diagnosis not present

## 2019-08-27 DIAGNOSIS — N2581 Secondary hyperparathyroidism of renal origin: Secondary | ICD-10-CM | POA: Diagnosis not present

## 2019-08-27 DIAGNOSIS — N186 End stage renal disease: Secondary | ICD-10-CM | POA: Diagnosis not present

## 2019-08-27 DIAGNOSIS — D509 Iron deficiency anemia, unspecified: Secondary | ICD-10-CM | POA: Diagnosis not present

## 2019-08-27 DIAGNOSIS — D631 Anemia in chronic kidney disease: Secondary | ICD-10-CM | POA: Diagnosis not present

## 2019-08-28 DIAGNOSIS — N2581 Secondary hyperparathyroidism of renal origin: Secondary | ICD-10-CM | POA: Diagnosis not present

## 2019-08-28 DIAGNOSIS — N186 End stage renal disease: Secondary | ICD-10-CM | POA: Diagnosis not present

## 2019-08-28 DIAGNOSIS — D509 Iron deficiency anemia, unspecified: Secondary | ICD-10-CM | POA: Diagnosis not present

## 2019-08-28 DIAGNOSIS — D631 Anemia in chronic kidney disease: Secondary | ICD-10-CM | POA: Diagnosis not present

## 2019-08-29 DIAGNOSIS — N2581 Secondary hyperparathyroidism of renal origin: Secondary | ICD-10-CM | POA: Diagnosis not present

## 2019-08-29 DIAGNOSIS — D631 Anemia in chronic kidney disease: Secondary | ICD-10-CM | POA: Diagnosis not present

## 2019-08-29 DIAGNOSIS — D509 Iron deficiency anemia, unspecified: Secondary | ICD-10-CM | POA: Diagnosis not present

## 2019-08-29 DIAGNOSIS — N186 End stage renal disease: Secondary | ICD-10-CM | POA: Diagnosis not present

## 2019-08-30 DIAGNOSIS — N2581 Secondary hyperparathyroidism of renal origin: Secondary | ICD-10-CM | POA: Diagnosis not present

## 2019-08-30 DIAGNOSIS — D631 Anemia in chronic kidney disease: Secondary | ICD-10-CM | POA: Diagnosis not present

## 2019-08-30 DIAGNOSIS — N186 End stage renal disease: Secondary | ICD-10-CM | POA: Diagnosis not present

## 2019-08-30 DIAGNOSIS — D509 Iron deficiency anemia, unspecified: Secondary | ICD-10-CM | POA: Diagnosis not present

## 2019-08-31 DIAGNOSIS — D631 Anemia in chronic kidney disease: Secondary | ICD-10-CM | POA: Diagnosis not present

## 2019-08-31 DIAGNOSIS — N2581 Secondary hyperparathyroidism of renal origin: Secondary | ICD-10-CM | POA: Diagnosis not present

## 2019-08-31 DIAGNOSIS — Z992 Dependence on renal dialysis: Secondary | ICD-10-CM | POA: Diagnosis not present

## 2019-08-31 DIAGNOSIS — N186 End stage renal disease: Secondary | ICD-10-CM | POA: Diagnosis not present

## 2019-08-31 DIAGNOSIS — D509 Iron deficiency anemia, unspecified: Secondary | ICD-10-CM | POA: Diagnosis not present

## 2019-09-01 DIAGNOSIS — N2581 Secondary hyperparathyroidism of renal origin: Secondary | ICD-10-CM | POA: Diagnosis not present

## 2019-09-01 DIAGNOSIS — N186 End stage renal disease: Secondary | ICD-10-CM | POA: Diagnosis not present

## 2019-09-01 DIAGNOSIS — D509 Iron deficiency anemia, unspecified: Secondary | ICD-10-CM | POA: Diagnosis not present

## 2019-09-01 DIAGNOSIS — D631 Anemia in chronic kidney disease: Secondary | ICD-10-CM | POA: Diagnosis not present

## 2019-09-02 DIAGNOSIS — N2581 Secondary hyperparathyroidism of renal origin: Secondary | ICD-10-CM | POA: Diagnosis not present

## 2019-09-02 DIAGNOSIS — N186 End stage renal disease: Secondary | ICD-10-CM | POA: Diagnosis not present

## 2019-09-02 DIAGNOSIS — D631 Anemia in chronic kidney disease: Secondary | ICD-10-CM | POA: Diagnosis not present

## 2019-09-02 DIAGNOSIS — D509 Iron deficiency anemia, unspecified: Secondary | ICD-10-CM | POA: Diagnosis not present

## 2019-09-03 DIAGNOSIS — E118 Type 2 diabetes mellitus with unspecified complications: Secondary | ICD-10-CM | POA: Diagnosis not present

## 2019-09-03 DIAGNOSIS — D631 Anemia in chronic kidney disease: Secondary | ICD-10-CM | POA: Diagnosis not present

## 2019-09-03 DIAGNOSIS — D509 Iron deficiency anemia, unspecified: Secondary | ICD-10-CM | POA: Diagnosis not present

## 2019-09-03 DIAGNOSIS — N2581 Secondary hyperparathyroidism of renal origin: Secondary | ICD-10-CM | POA: Diagnosis not present

## 2019-09-03 DIAGNOSIS — N186 End stage renal disease: Secondary | ICD-10-CM | POA: Diagnosis not present

## 2019-09-03 DIAGNOSIS — Z7682 Awaiting organ transplant status: Secondary | ICD-10-CM | POA: Diagnosis not present

## 2019-09-03 DIAGNOSIS — Z4932 Encounter for adequacy testing for peritoneal dialysis: Secondary | ICD-10-CM | POA: Diagnosis not present

## 2019-09-04 DIAGNOSIS — N186 End stage renal disease: Secondary | ICD-10-CM | POA: Diagnosis not present

## 2019-09-04 DIAGNOSIS — D631 Anemia in chronic kidney disease: Secondary | ICD-10-CM | POA: Diagnosis not present

## 2019-09-04 DIAGNOSIS — D509 Iron deficiency anemia, unspecified: Secondary | ICD-10-CM | POA: Diagnosis not present

## 2019-09-04 DIAGNOSIS — N2581 Secondary hyperparathyroidism of renal origin: Secondary | ICD-10-CM | POA: Diagnosis not present

## 2019-09-05 DIAGNOSIS — N2581 Secondary hyperparathyroidism of renal origin: Secondary | ICD-10-CM | POA: Diagnosis not present

## 2019-09-05 DIAGNOSIS — N186 End stage renal disease: Secondary | ICD-10-CM | POA: Diagnosis not present

## 2019-09-05 DIAGNOSIS — D509 Iron deficiency anemia, unspecified: Secondary | ICD-10-CM | POA: Diagnosis not present

## 2019-09-05 DIAGNOSIS — D631 Anemia in chronic kidney disease: Secondary | ICD-10-CM | POA: Diagnosis not present

## 2019-09-06 DIAGNOSIS — D509 Iron deficiency anemia, unspecified: Secondary | ICD-10-CM | POA: Diagnosis not present

## 2019-09-06 DIAGNOSIS — D631 Anemia in chronic kidney disease: Secondary | ICD-10-CM | POA: Diagnosis not present

## 2019-09-06 DIAGNOSIS — N186 End stage renal disease: Secondary | ICD-10-CM | POA: Diagnosis not present

## 2019-09-06 DIAGNOSIS — N2581 Secondary hyperparathyroidism of renal origin: Secondary | ICD-10-CM | POA: Diagnosis not present

## 2019-09-07 DIAGNOSIS — D631 Anemia in chronic kidney disease: Secondary | ICD-10-CM | POA: Diagnosis not present

## 2019-09-07 DIAGNOSIS — N186 End stage renal disease: Secondary | ICD-10-CM | POA: Diagnosis not present

## 2019-09-07 DIAGNOSIS — N2581 Secondary hyperparathyroidism of renal origin: Secondary | ICD-10-CM | POA: Diagnosis not present

## 2019-09-07 DIAGNOSIS — D509 Iron deficiency anemia, unspecified: Secondary | ICD-10-CM | POA: Diagnosis not present

## 2019-09-08 DIAGNOSIS — D509 Iron deficiency anemia, unspecified: Secondary | ICD-10-CM | POA: Diagnosis not present

## 2019-09-08 DIAGNOSIS — N186 End stage renal disease: Secondary | ICD-10-CM | POA: Diagnosis not present

## 2019-09-08 DIAGNOSIS — N2581 Secondary hyperparathyroidism of renal origin: Secondary | ICD-10-CM | POA: Diagnosis not present

## 2019-09-08 DIAGNOSIS — D631 Anemia in chronic kidney disease: Secondary | ICD-10-CM | POA: Diagnosis not present

## 2019-09-09 DIAGNOSIS — N186 End stage renal disease: Secondary | ICD-10-CM | POA: Diagnosis not present

## 2019-09-09 DIAGNOSIS — D631 Anemia in chronic kidney disease: Secondary | ICD-10-CM | POA: Diagnosis not present

## 2019-09-09 DIAGNOSIS — D509 Iron deficiency anemia, unspecified: Secondary | ICD-10-CM | POA: Diagnosis not present

## 2019-09-09 DIAGNOSIS — N2581 Secondary hyperparathyroidism of renal origin: Secondary | ICD-10-CM | POA: Diagnosis not present

## 2019-09-10 DIAGNOSIS — N186 End stage renal disease: Secondary | ICD-10-CM | POA: Diagnosis not present

## 2019-09-10 DIAGNOSIS — N2581 Secondary hyperparathyroidism of renal origin: Secondary | ICD-10-CM | POA: Diagnosis not present

## 2019-09-10 DIAGNOSIS — D509 Iron deficiency anemia, unspecified: Secondary | ICD-10-CM | POA: Diagnosis not present

## 2019-09-10 DIAGNOSIS — D631 Anemia in chronic kidney disease: Secondary | ICD-10-CM | POA: Diagnosis not present

## 2019-09-11 DIAGNOSIS — D509 Iron deficiency anemia, unspecified: Secondary | ICD-10-CM | POA: Diagnosis not present

## 2019-09-11 DIAGNOSIS — D631 Anemia in chronic kidney disease: Secondary | ICD-10-CM | POA: Diagnosis not present

## 2019-09-11 DIAGNOSIS — N186 End stage renal disease: Secondary | ICD-10-CM | POA: Diagnosis not present

## 2019-09-11 DIAGNOSIS — N2581 Secondary hyperparathyroidism of renal origin: Secondary | ICD-10-CM | POA: Diagnosis not present

## 2019-09-12 DIAGNOSIS — D509 Iron deficiency anemia, unspecified: Secondary | ICD-10-CM | POA: Diagnosis not present

## 2019-09-12 DIAGNOSIS — N186 End stage renal disease: Secondary | ICD-10-CM | POA: Diagnosis not present

## 2019-09-12 DIAGNOSIS — N2581 Secondary hyperparathyroidism of renal origin: Secondary | ICD-10-CM | POA: Diagnosis not present

## 2019-09-12 DIAGNOSIS — D631 Anemia in chronic kidney disease: Secondary | ICD-10-CM | POA: Diagnosis not present

## 2019-09-13 DIAGNOSIS — D509 Iron deficiency anemia, unspecified: Secondary | ICD-10-CM | POA: Diagnosis not present

## 2019-09-13 DIAGNOSIS — N2581 Secondary hyperparathyroidism of renal origin: Secondary | ICD-10-CM | POA: Diagnosis not present

## 2019-09-13 DIAGNOSIS — N186 End stage renal disease: Secondary | ICD-10-CM | POA: Diagnosis not present

## 2019-09-13 DIAGNOSIS — D631 Anemia in chronic kidney disease: Secondary | ICD-10-CM | POA: Diagnosis not present

## 2019-09-14 DIAGNOSIS — N186 End stage renal disease: Secondary | ICD-10-CM | POA: Diagnosis not present

## 2019-09-14 DIAGNOSIS — D631 Anemia in chronic kidney disease: Secondary | ICD-10-CM | POA: Diagnosis not present

## 2019-09-14 DIAGNOSIS — D509 Iron deficiency anemia, unspecified: Secondary | ICD-10-CM | POA: Diagnosis not present

## 2019-09-14 DIAGNOSIS — N2581 Secondary hyperparathyroidism of renal origin: Secondary | ICD-10-CM | POA: Diagnosis not present

## 2019-09-15 DIAGNOSIS — N2581 Secondary hyperparathyroidism of renal origin: Secondary | ICD-10-CM | POA: Diagnosis not present

## 2019-09-15 DIAGNOSIS — D631 Anemia in chronic kidney disease: Secondary | ICD-10-CM | POA: Diagnosis not present

## 2019-09-15 DIAGNOSIS — D509 Iron deficiency anemia, unspecified: Secondary | ICD-10-CM | POA: Diagnosis not present

## 2019-09-15 DIAGNOSIS — N186 End stage renal disease: Secondary | ICD-10-CM | POA: Diagnosis not present

## 2019-09-16 DIAGNOSIS — D631 Anemia in chronic kidney disease: Secondary | ICD-10-CM | POA: Diagnosis not present

## 2019-09-16 DIAGNOSIS — D509 Iron deficiency anemia, unspecified: Secondary | ICD-10-CM | POA: Diagnosis not present

## 2019-09-16 DIAGNOSIS — N2581 Secondary hyperparathyroidism of renal origin: Secondary | ICD-10-CM | POA: Diagnosis not present

## 2019-09-16 DIAGNOSIS — N186 End stage renal disease: Secondary | ICD-10-CM | POA: Diagnosis not present

## 2019-09-17 DIAGNOSIS — D631 Anemia in chronic kidney disease: Secondary | ICD-10-CM | POA: Diagnosis not present

## 2019-09-17 DIAGNOSIS — N186 End stage renal disease: Secondary | ICD-10-CM | POA: Diagnosis not present

## 2019-09-17 DIAGNOSIS — N2581 Secondary hyperparathyroidism of renal origin: Secondary | ICD-10-CM | POA: Diagnosis not present

## 2019-09-17 DIAGNOSIS — D509 Iron deficiency anemia, unspecified: Secondary | ICD-10-CM | POA: Diagnosis not present

## 2019-09-18 DIAGNOSIS — D509 Iron deficiency anemia, unspecified: Secondary | ICD-10-CM | POA: Diagnosis not present

## 2019-09-18 DIAGNOSIS — R591 Generalized enlarged lymph nodes: Secondary | ICD-10-CM | POA: Diagnosis not present

## 2019-09-18 DIAGNOSIS — I120 Hypertensive chronic kidney disease with stage 5 chronic kidney disease or end stage renal disease: Secondary | ICD-10-CM | POA: Diagnosis not present

## 2019-09-18 DIAGNOSIS — D631 Anemia in chronic kidney disease: Secondary | ICD-10-CM | POA: Diagnosis not present

## 2019-09-18 DIAGNOSIS — N186 End stage renal disease: Secondary | ICD-10-CM | POA: Diagnosis not present

## 2019-09-18 DIAGNOSIS — E890 Postprocedural hypothyroidism: Secondary | ICD-10-CM | POA: Diagnosis not present

## 2019-09-18 DIAGNOSIS — N2581 Secondary hyperparathyroidism of renal origin: Secondary | ICD-10-CM | POA: Diagnosis not present

## 2019-09-18 DIAGNOSIS — Z79899 Other long term (current) drug therapy: Secondary | ICD-10-CM | POA: Diagnosis not present

## 2019-09-18 DIAGNOSIS — Z7951 Long term (current) use of inhaled steroids: Secondary | ICD-10-CM | POA: Diagnosis not present

## 2019-09-19 DIAGNOSIS — D631 Anemia in chronic kidney disease: Secondary | ICD-10-CM | POA: Diagnosis not present

## 2019-09-19 DIAGNOSIS — D509 Iron deficiency anemia, unspecified: Secondary | ICD-10-CM | POA: Diagnosis not present

## 2019-09-19 DIAGNOSIS — N2581 Secondary hyperparathyroidism of renal origin: Secondary | ICD-10-CM | POA: Diagnosis not present

## 2019-09-19 DIAGNOSIS — N186 End stage renal disease: Secondary | ICD-10-CM | POA: Diagnosis not present

## 2019-09-20 DIAGNOSIS — N186 End stage renal disease: Secondary | ICD-10-CM | POA: Diagnosis not present

## 2019-09-20 DIAGNOSIS — D631 Anemia in chronic kidney disease: Secondary | ICD-10-CM | POA: Diagnosis not present

## 2019-09-20 DIAGNOSIS — D509 Iron deficiency anemia, unspecified: Secondary | ICD-10-CM | POA: Diagnosis not present

## 2019-09-20 DIAGNOSIS — N2581 Secondary hyperparathyroidism of renal origin: Secondary | ICD-10-CM | POA: Diagnosis not present

## 2019-09-21 DIAGNOSIS — N2581 Secondary hyperparathyroidism of renal origin: Secondary | ICD-10-CM | POA: Diagnosis not present

## 2019-09-21 DIAGNOSIS — D509 Iron deficiency anemia, unspecified: Secondary | ICD-10-CM | POA: Diagnosis not present

## 2019-09-21 DIAGNOSIS — D631 Anemia in chronic kidney disease: Secondary | ICD-10-CM | POA: Diagnosis not present

## 2019-09-21 DIAGNOSIS — N186 End stage renal disease: Secondary | ICD-10-CM | POA: Diagnosis not present

## 2019-09-22 DIAGNOSIS — D631 Anemia in chronic kidney disease: Secondary | ICD-10-CM | POA: Diagnosis not present

## 2019-09-22 DIAGNOSIS — D509 Iron deficiency anemia, unspecified: Secondary | ICD-10-CM | POA: Diagnosis not present

## 2019-09-22 DIAGNOSIS — N2581 Secondary hyperparathyroidism of renal origin: Secondary | ICD-10-CM | POA: Diagnosis not present

## 2019-09-22 DIAGNOSIS — N186 End stage renal disease: Secondary | ICD-10-CM | POA: Diagnosis not present

## 2019-09-23 DIAGNOSIS — D509 Iron deficiency anemia, unspecified: Secondary | ICD-10-CM | POA: Diagnosis not present

## 2019-09-23 DIAGNOSIS — N2581 Secondary hyperparathyroidism of renal origin: Secondary | ICD-10-CM | POA: Diagnosis not present

## 2019-09-23 DIAGNOSIS — D631 Anemia in chronic kidney disease: Secondary | ICD-10-CM | POA: Diagnosis not present

## 2019-09-23 DIAGNOSIS — N186 End stage renal disease: Secondary | ICD-10-CM | POA: Diagnosis not present

## 2019-09-24 DIAGNOSIS — N2581 Secondary hyperparathyroidism of renal origin: Secondary | ICD-10-CM | POA: Diagnosis not present

## 2019-09-24 DIAGNOSIS — D631 Anemia in chronic kidney disease: Secondary | ICD-10-CM | POA: Diagnosis not present

## 2019-09-24 DIAGNOSIS — D509 Iron deficiency anemia, unspecified: Secondary | ICD-10-CM | POA: Diagnosis not present

## 2019-09-24 DIAGNOSIS — N186 End stage renal disease: Secondary | ICD-10-CM | POA: Diagnosis not present

## 2019-09-25 DIAGNOSIS — D631 Anemia in chronic kidney disease: Secondary | ICD-10-CM | POA: Diagnosis not present

## 2019-09-25 DIAGNOSIS — N2581 Secondary hyperparathyroidism of renal origin: Secondary | ICD-10-CM | POA: Diagnosis not present

## 2019-09-25 DIAGNOSIS — N186 End stage renal disease: Secondary | ICD-10-CM | POA: Diagnosis not present

## 2019-09-25 DIAGNOSIS — D509 Iron deficiency anemia, unspecified: Secondary | ICD-10-CM | POA: Diagnosis not present

## 2019-09-26 DIAGNOSIS — D509 Iron deficiency anemia, unspecified: Secondary | ICD-10-CM | POA: Diagnosis not present

## 2019-09-26 DIAGNOSIS — D631 Anemia in chronic kidney disease: Secondary | ICD-10-CM | POA: Diagnosis not present

## 2019-09-26 DIAGNOSIS — N2581 Secondary hyperparathyroidism of renal origin: Secondary | ICD-10-CM | POA: Diagnosis not present

## 2019-09-26 DIAGNOSIS — N186 End stage renal disease: Secondary | ICD-10-CM | POA: Diagnosis not present

## 2019-09-27 DIAGNOSIS — N186 End stage renal disease: Secondary | ICD-10-CM | POA: Diagnosis not present

## 2019-09-27 DIAGNOSIS — D631 Anemia in chronic kidney disease: Secondary | ICD-10-CM | POA: Diagnosis not present

## 2019-09-27 DIAGNOSIS — D509 Iron deficiency anemia, unspecified: Secondary | ICD-10-CM | POA: Diagnosis not present

## 2019-09-27 DIAGNOSIS — N2581 Secondary hyperparathyroidism of renal origin: Secondary | ICD-10-CM | POA: Diagnosis not present

## 2019-09-28 DIAGNOSIS — D509 Iron deficiency anemia, unspecified: Secondary | ICD-10-CM | POA: Diagnosis not present

## 2019-09-28 DIAGNOSIS — N2581 Secondary hyperparathyroidism of renal origin: Secondary | ICD-10-CM | POA: Diagnosis not present

## 2019-09-28 DIAGNOSIS — D631 Anemia in chronic kidney disease: Secondary | ICD-10-CM | POA: Diagnosis not present

## 2019-09-28 DIAGNOSIS — N186 End stage renal disease: Secondary | ICD-10-CM | POA: Diagnosis not present

## 2019-09-29 DIAGNOSIS — D631 Anemia in chronic kidney disease: Secondary | ICD-10-CM | POA: Diagnosis not present

## 2019-09-29 DIAGNOSIS — N2581 Secondary hyperparathyroidism of renal origin: Secondary | ICD-10-CM | POA: Diagnosis not present

## 2019-09-29 DIAGNOSIS — N186 End stage renal disease: Secondary | ICD-10-CM | POA: Diagnosis not present

## 2019-09-29 DIAGNOSIS — D509 Iron deficiency anemia, unspecified: Secondary | ICD-10-CM | POA: Diagnosis not present

## 2019-09-30 DIAGNOSIS — N186 End stage renal disease: Secondary | ICD-10-CM | POA: Diagnosis not present

## 2019-09-30 DIAGNOSIS — Z992 Dependence on renal dialysis: Secondary | ICD-10-CM | POA: Diagnosis not present

## 2019-09-30 DIAGNOSIS — N2581 Secondary hyperparathyroidism of renal origin: Secondary | ICD-10-CM | POA: Diagnosis not present

## 2019-09-30 DIAGNOSIS — D631 Anemia in chronic kidney disease: Secondary | ICD-10-CM | POA: Diagnosis not present

## 2019-09-30 DIAGNOSIS — D509 Iron deficiency anemia, unspecified: Secondary | ICD-10-CM | POA: Diagnosis not present

## 2019-10-01 DIAGNOSIS — N186 End stage renal disease: Secondary | ICD-10-CM | POA: Diagnosis not present

## 2019-10-01 DIAGNOSIS — D631 Anemia in chronic kidney disease: Secondary | ICD-10-CM | POA: Diagnosis not present

## 2019-10-01 DIAGNOSIS — N2581 Secondary hyperparathyroidism of renal origin: Secondary | ICD-10-CM | POA: Diagnosis not present

## 2019-10-01 DIAGNOSIS — D509 Iron deficiency anemia, unspecified: Secondary | ICD-10-CM | POA: Diagnosis not present

## 2019-10-01 DIAGNOSIS — Z23 Encounter for immunization: Secondary | ICD-10-CM | POA: Diagnosis not present

## 2019-10-02 DIAGNOSIS — D631 Anemia in chronic kidney disease: Secondary | ICD-10-CM | POA: Diagnosis not present

## 2019-10-02 DIAGNOSIS — D509 Iron deficiency anemia, unspecified: Secondary | ICD-10-CM | POA: Diagnosis not present

## 2019-10-02 DIAGNOSIS — Z23 Encounter for immunization: Secondary | ICD-10-CM | POA: Diagnosis not present

## 2019-10-02 DIAGNOSIS — N2581 Secondary hyperparathyroidism of renal origin: Secondary | ICD-10-CM | POA: Diagnosis not present

## 2019-10-02 DIAGNOSIS — N186 End stage renal disease: Secondary | ICD-10-CM | POA: Diagnosis not present

## 2019-10-03 DIAGNOSIS — Z23 Encounter for immunization: Secondary | ICD-10-CM | POA: Diagnosis not present

## 2019-10-03 DIAGNOSIS — N2581 Secondary hyperparathyroidism of renal origin: Secondary | ICD-10-CM | POA: Diagnosis not present

## 2019-10-03 DIAGNOSIS — D631 Anemia in chronic kidney disease: Secondary | ICD-10-CM | POA: Diagnosis not present

## 2019-10-03 DIAGNOSIS — N186 End stage renal disease: Secondary | ICD-10-CM | POA: Diagnosis not present

## 2019-10-03 DIAGNOSIS — D509 Iron deficiency anemia, unspecified: Secondary | ICD-10-CM | POA: Diagnosis not present

## 2019-10-04 DIAGNOSIS — D509 Iron deficiency anemia, unspecified: Secondary | ICD-10-CM | POA: Diagnosis not present

## 2019-10-04 DIAGNOSIS — N2581 Secondary hyperparathyroidism of renal origin: Secondary | ICD-10-CM | POA: Diagnosis not present

## 2019-10-04 DIAGNOSIS — N186 End stage renal disease: Secondary | ICD-10-CM | POA: Diagnosis not present

## 2019-10-04 DIAGNOSIS — D631 Anemia in chronic kidney disease: Secondary | ICD-10-CM | POA: Diagnosis not present

## 2019-10-04 DIAGNOSIS — Z23 Encounter for immunization: Secondary | ICD-10-CM | POA: Diagnosis not present

## 2019-10-05 DIAGNOSIS — N2581 Secondary hyperparathyroidism of renal origin: Secondary | ICD-10-CM | POA: Diagnosis not present

## 2019-10-05 DIAGNOSIS — Z23 Encounter for immunization: Secondary | ICD-10-CM | POA: Diagnosis not present

## 2019-10-05 DIAGNOSIS — D509 Iron deficiency anemia, unspecified: Secondary | ICD-10-CM | POA: Diagnosis not present

## 2019-10-05 DIAGNOSIS — N186 End stage renal disease: Secondary | ICD-10-CM | POA: Diagnosis not present

## 2019-10-05 DIAGNOSIS — D631 Anemia in chronic kidney disease: Secondary | ICD-10-CM | POA: Diagnosis not present

## 2019-10-06 DIAGNOSIS — D509 Iron deficiency anemia, unspecified: Secondary | ICD-10-CM | POA: Diagnosis not present

## 2019-10-06 DIAGNOSIS — M25511 Pain in right shoulder: Secondary | ICD-10-CM | POA: Diagnosis not present

## 2019-10-06 DIAGNOSIS — N2581 Secondary hyperparathyroidism of renal origin: Secondary | ICD-10-CM | POA: Diagnosis not present

## 2019-10-06 DIAGNOSIS — D631 Anemia in chronic kidney disease: Secondary | ICD-10-CM | POA: Diagnosis not present

## 2019-10-06 DIAGNOSIS — N186 End stage renal disease: Secondary | ICD-10-CM | POA: Diagnosis not present

## 2019-10-06 DIAGNOSIS — Z23 Encounter for immunization: Secondary | ICD-10-CM | POA: Diagnosis not present

## 2019-10-07 ENCOUNTER — Other Ambulatory Visit: Payer: Self-pay | Admitting: Cardiology

## 2019-10-07 ENCOUNTER — Telehealth: Payer: Self-pay | Admitting: Orthopedic Surgery

## 2019-10-07 DIAGNOSIS — Z23 Encounter for immunization: Secondary | ICD-10-CM | POA: Diagnosis not present

## 2019-10-07 DIAGNOSIS — D631 Anemia in chronic kidney disease: Secondary | ICD-10-CM | POA: Diagnosis not present

## 2019-10-07 DIAGNOSIS — N2581 Secondary hyperparathyroidism of renal origin: Secondary | ICD-10-CM | POA: Diagnosis not present

## 2019-10-07 DIAGNOSIS — D509 Iron deficiency anemia, unspecified: Secondary | ICD-10-CM | POA: Diagnosis not present

## 2019-10-07 DIAGNOSIS — N186 End stage renal disease: Secondary | ICD-10-CM | POA: Diagnosis not present

## 2019-10-07 NOTE — Telephone Encounter (Signed)
Call received from patient with question following urgent care visit in [facility]), for new injury to left arm - said she thinks she may have a torn muscle.  Aware Dr Aline Brochure does not have an immediate opening; also that he would need to review notes and films and would then further advise.

## 2019-10-08 DIAGNOSIS — D509 Iron deficiency anemia, unspecified: Secondary | ICD-10-CM | POA: Diagnosis not present

## 2019-10-08 DIAGNOSIS — Z23 Encounter for immunization: Secondary | ICD-10-CM | POA: Diagnosis not present

## 2019-10-08 DIAGNOSIS — D631 Anemia in chronic kidney disease: Secondary | ICD-10-CM | POA: Diagnosis not present

## 2019-10-08 DIAGNOSIS — N186 End stage renal disease: Secondary | ICD-10-CM | POA: Diagnosis not present

## 2019-10-08 DIAGNOSIS — N2581 Secondary hyperparathyroidism of renal origin: Secondary | ICD-10-CM | POA: Diagnosis not present

## 2019-10-09 DIAGNOSIS — N2581 Secondary hyperparathyroidism of renal origin: Secondary | ICD-10-CM | POA: Diagnosis not present

## 2019-10-09 DIAGNOSIS — Z23 Encounter for immunization: Secondary | ICD-10-CM | POA: Diagnosis not present

## 2019-10-09 DIAGNOSIS — N186 End stage renal disease: Secondary | ICD-10-CM | POA: Diagnosis not present

## 2019-10-09 DIAGNOSIS — D631 Anemia in chronic kidney disease: Secondary | ICD-10-CM | POA: Diagnosis not present

## 2019-10-09 DIAGNOSIS — D509 Iron deficiency anemia, unspecified: Secondary | ICD-10-CM | POA: Diagnosis not present

## 2019-10-10 DIAGNOSIS — Z23 Encounter for immunization: Secondary | ICD-10-CM | POA: Diagnosis not present

## 2019-10-10 DIAGNOSIS — N2581 Secondary hyperparathyroidism of renal origin: Secondary | ICD-10-CM | POA: Diagnosis not present

## 2019-10-10 DIAGNOSIS — D631 Anemia in chronic kidney disease: Secondary | ICD-10-CM | POA: Diagnosis not present

## 2019-10-10 DIAGNOSIS — N186 End stage renal disease: Secondary | ICD-10-CM | POA: Diagnosis not present

## 2019-10-10 DIAGNOSIS — D509 Iron deficiency anemia, unspecified: Secondary | ICD-10-CM | POA: Diagnosis not present

## 2019-10-11 DIAGNOSIS — N2581 Secondary hyperparathyroidism of renal origin: Secondary | ICD-10-CM | POA: Diagnosis not present

## 2019-10-11 DIAGNOSIS — D631 Anemia in chronic kidney disease: Secondary | ICD-10-CM | POA: Diagnosis not present

## 2019-10-11 DIAGNOSIS — N186 End stage renal disease: Secondary | ICD-10-CM | POA: Diagnosis not present

## 2019-10-11 DIAGNOSIS — D509 Iron deficiency anemia, unspecified: Secondary | ICD-10-CM | POA: Diagnosis not present

## 2019-10-11 DIAGNOSIS — Z23 Encounter for immunization: Secondary | ICD-10-CM | POA: Diagnosis not present

## 2019-10-12 DIAGNOSIS — N186 End stage renal disease: Secondary | ICD-10-CM | POA: Diagnosis not present

## 2019-10-12 DIAGNOSIS — Z23 Encounter for immunization: Secondary | ICD-10-CM | POA: Diagnosis not present

## 2019-10-12 DIAGNOSIS — D509 Iron deficiency anemia, unspecified: Secondary | ICD-10-CM | POA: Diagnosis not present

## 2019-10-12 DIAGNOSIS — D631 Anemia in chronic kidney disease: Secondary | ICD-10-CM | POA: Diagnosis not present

## 2019-10-12 DIAGNOSIS — N2581 Secondary hyperparathyroidism of renal origin: Secondary | ICD-10-CM | POA: Diagnosis not present

## 2019-10-13 DIAGNOSIS — D631 Anemia in chronic kidney disease: Secondary | ICD-10-CM | POA: Diagnosis not present

## 2019-10-13 DIAGNOSIS — Z23 Encounter for immunization: Secondary | ICD-10-CM | POA: Diagnosis not present

## 2019-10-13 DIAGNOSIS — N186 End stage renal disease: Secondary | ICD-10-CM | POA: Diagnosis not present

## 2019-10-13 DIAGNOSIS — D509 Iron deficiency anemia, unspecified: Secondary | ICD-10-CM | POA: Diagnosis not present

## 2019-10-13 DIAGNOSIS — N2581 Secondary hyperparathyroidism of renal origin: Secondary | ICD-10-CM | POA: Diagnosis not present

## 2019-10-14 DIAGNOSIS — Z79899 Other long term (current) drug therapy: Secondary | ICD-10-CM | POA: Diagnosis not present

## 2019-10-14 DIAGNOSIS — L219 Seborrheic dermatitis, unspecified: Secondary | ICD-10-CM | POA: Diagnosis not present

## 2019-10-14 DIAGNOSIS — L308 Other specified dermatitis: Secondary | ICD-10-CM | POA: Diagnosis not present

## 2019-10-14 DIAGNOSIS — N186 End stage renal disease: Secondary | ICD-10-CM | POA: Diagnosis not present

## 2019-10-14 DIAGNOSIS — N2581 Secondary hyperparathyroidism of renal origin: Secondary | ICD-10-CM | POA: Diagnosis not present

## 2019-10-14 DIAGNOSIS — D631 Anemia in chronic kidney disease: Secondary | ICD-10-CM | POA: Diagnosis not present

## 2019-10-14 DIAGNOSIS — D509 Iron deficiency anemia, unspecified: Secondary | ICD-10-CM | POA: Diagnosis not present

## 2019-10-14 DIAGNOSIS — Z23 Encounter for immunization: Secondary | ICD-10-CM | POA: Diagnosis not present

## 2019-10-15 DIAGNOSIS — D631 Anemia in chronic kidney disease: Secondary | ICD-10-CM | POA: Diagnosis not present

## 2019-10-15 DIAGNOSIS — N2581 Secondary hyperparathyroidism of renal origin: Secondary | ICD-10-CM | POA: Diagnosis not present

## 2019-10-15 DIAGNOSIS — D509 Iron deficiency anemia, unspecified: Secondary | ICD-10-CM | POA: Diagnosis not present

## 2019-10-15 DIAGNOSIS — Z23 Encounter for immunization: Secondary | ICD-10-CM | POA: Diagnosis not present

## 2019-10-15 DIAGNOSIS — N186 End stage renal disease: Secondary | ICD-10-CM | POA: Diagnosis not present

## 2019-10-16 DIAGNOSIS — N186 End stage renal disease: Secondary | ICD-10-CM | POA: Diagnosis not present

## 2019-10-16 DIAGNOSIS — N2581 Secondary hyperparathyroidism of renal origin: Secondary | ICD-10-CM | POA: Diagnosis not present

## 2019-10-16 DIAGNOSIS — D631 Anemia in chronic kidney disease: Secondary | ICD-10-CM | POA: Diagnosis not present

## 2019-10-16 DIAGNOSIS — D509 Iron deficiency anemia, unspecified: Secondary | ICD-10-CM | POA: Diagnosis not present

## 2019-10-16 DIAGNOSIS — Z23 Encounter for immunization: Secondary | ICD-10-CM | POA: Diagnosis not present

## 2019-10-17 DIAGNOSIS — D631 Anemia in chronic kidney disease: Secondary | ICD-10-CM | POA: Diagnosis not present

## 2019-10-17 DIAGNOSIS — N186 End stage renal disease: Secondary | ICD-10-CM | POA: Diagnosis not present

## 2019-10-17 DIAGNOSIS — D509 Iron deficiency anemia, unspecified: Secondary | ICD-10-CM | POA: Diagnosis not present

## 2019-10-17 DIAGNOSIS — N2581 Secondary hyperparathyroidism of renal origin: Secondary | ICD-10-CM | POA: Diagnosis not present

## 2019-10-17 DIAGNOSIS — Z23 Encounter for immunization: Secondary | ICD-10-CM | POA: Diagnosis not present

## 2019-10-18 DIAGNOSIS — N186 End stage renal disease: Secondary | ICD-10-CM | POA: Diagnosis not present

## 2019-10-18 DIAGNOSIS — D631 Anemia in chronic kidney disease: Secondary | ICD-10-CM | POA: Diagnosis not present

## 2019-10-18 DIAGNOSIS — Z23 Encounter for immunization: Secondary | ICD-10-CM | POA: Diagnosis not present

## 2019-10-18 DIAGNOSIS — N2581 Secondary hyperparathyroidism of renal origin: Secondary | ICD-10-CM | POA: Diagnosis not present

## 2019-10-18 DIAGNOSIS — D509 Iron deficiency anemia, unspecified: Secondary | ICD-10-CM | POA: Diagnosis not present

## 2019-10-19 DIAGNOSIS — D509 Iron deficiency anemia, unspecified: Secondary | ICD-10-CM | POA: Diagnosis not present

## 2019-10-19 DIAGNOSIS — N2581 Secondary hyperparathyroidism of renal origin: Secondary | ICD-10-CM | POA: Diagnosis not present

## 2019-10-19 DIAGNOSIS — D631 Anemia in chronic kidney disease: Secondary | ICD-10-CM | POA: Diagnosis not present

## 2019-10-19 DIAGNOSIS — Z23 Encounter for immunization: Secondary | ICD-10-CM | POA: Diagnosis not present

## 2019-10-19 DIAGNOSIS — N186 End stage renal disease: Secondary | ICD-10-CM | POA: Diagnosis not present

## 2019-10-20 DIAGNOSIS — D631 Anemia in chronic kidney disease: Secondary | ICD-10-CM | POA: Diagnosis not present

## 2019-10-20 DIAGNOSIS — N2581 Secondary hyperparathyroidism of renal origin: Secondary | ICD-10-CM | POA: Diagnosis not present

## 2019-10-20 DIAGNOSIS — N186 End stage renal disease: Secondary | ICD-10-CM | POA: Diagnosis not present

## 2019-10-20 DIAGNOSIS — D509 Iron deficiency anemia, unspecified: Secondary | ICD-10-CM | POA: Diagnosis not present

## 2019-10-20 DIAGNOSIS — Z23 Encounter for immunization: Secondary | ICD-10-CM | POA: Diagnosis not present

## 2019-10-21 DIAGNOSIS — Z23 Encounter for immunization: Secondary | ICD-10-CM | POA: Diagnosis not present

## 2019-10-21 DIAGNOSIS — D631 Anemia in chronic kidney disease: Secondary | ICD-10-CM | POA: Diagnosis not present

## 2019-10-21 DIAGNOSIS — N2581 Secondary hyperparathyroidism of renal origin: Secondary | ICD-10-CM | POA: Diagnosis not present

## 2019-10-21 DIAGNOSIS — N186 End stage renal disease: Secondary | ICD-10-CM | POA: Diagnosis not present

## 2019-10-21 DIAGNOSIS — D509 Iron deficiency anemia, unspecified: Secondary | ICD-10-CM | POA: Diagnosis not present

## 2019-10-22 DIAGNOSIS — N2581 Secondary hyperparathyroidism of renal origin: Secondary | ICD-10-CM | POA: Diagnosis not present

## 2019-10-22 DIAGNOSIS — F54 Psychological and behavioral factors associated with disorders or diseases classified elsewhere: Secondary | ICD-10-CM | POA: Diagnosis not present

## 2019-10-22 DIAGNOSIS — Z23 Encounter for immunization: Secondary | ICD-10-CM | POA: Diagnosis not present

## 2019-10-22 DIAGNOSIS — D631 Anemia in chronic kidney disease: Secondary | ICD-10-CM | POA: Diagnosis not present

## 2019-10-22 DIAGNOSIS — Z01818 Encounter for other preprocedural examination: Secondary | ICD-10-CM | POA: Diagnosis not present

## 2019-10-22 DIAGNOSIS — D509 Iron deficiency anemia, unspecified: Secondary | ICD-10-CM | POA: Diagnosis not present

## 2019-10-22 DIAGNOSIS — N186 End stage renal disease: Secondary | ICD-10-CM | POA: Diagnosis not present

## 2019-10-23 DIAGNOSIS — E1122 Type 2 diabetes mellitus with diabetic chronic kidney disease: Secondary | ICD-10-CM | POA: Diagnosis not present

## 2019-10-23 DIAGNOSIS — Z4822 Encounter for aftercare following kidney transplant: Secondary | ICD-10-CM | POA: Diagnosis not present

## 2019-10-23 DIAGNOSIS — N051 Unspecified nephritic syndrome with focal and segmental glomerular lesions: Secondary | ICD-10-CM | POA: Diagnosis not present

## 2019-10-23 DIAGNOSIS — Z23 Encounter for immunization: Secondary | ICD-10-CM | POA: Diagnosis not present

## 2019-10-23 DIAGNOSIS — F339 Major depressive disorder, recurrent, unspecified: Secondary | ICD-10-CM | POA: Diagnosis not present

## 2019-10-23 DIAGNOSIS — Z992 Dependence on renal dialysis: Secondary | ICD-10-CM | POA: Diagnosis not present

## 2019-10-23 DIAGNOSIS — N2581 Secondary hyperparathyroidism of renal origin: Secondary | ICD-10-CM | POA: Diagnosis not present

## 2019-10-23 DIAGNOSIS — D631 Anemia in chronic kidney disease: Secondary | ICD-10-CM | POA: Diagnosis not present

## 2019-10-23 DIAGNOSIS — Z9884 Bariatric surgery status: Secondary | ICD-10-CM | POA: Diagnosis not present

## 2019-10-23 DIAGNOSIS — Z85850 Personal history of malignant neoplasm of thyroid: Secondary | ICD-10-CM | POA: Diagnosis not present

## 2019-10-23 DIAGNOSIS — Z7682 Awaiting organ transplant status: Secondary | ICD-10-CM | POA: Diagnosis not present

## 2019-10-23 DIAGNOSIS — N186 End stage renal disease: Secondary | ICD-10-CM | POA: Diagnosis not present

## 2019-10-23 DIAGNOSIS — D509 Iron deficiency anemia, unspecified: Secondary | ICD-10-CM | POA: Diagnosis not present

## 2019-10-23 DIAGNOSIS — F419 Anxiety disorder, unspecified: Secondary | ICD-10-CM | POA: Diagnosis not present

## 2019-10-24 DIAGNOSIS — N186 End stage renal disease: Secondary | ICD-10-CM | POA: Diagnosis not present

## 2019-10-24 DIAGNOSIS — D509 Iron deficiency anemia, unspecified: Secondary | ICD-10-CM | POA: Diagnosis not present

## 2019-10-24 DIAGNOSIS — Z23 Encounter for immunization: Secondary | ICD-10-CM | POA: Diagnosis not present

## 2019-10-24 DIAGNOSIS — D631 Anemia in chronic kidney disease: Secondary | ICD-10-CM | POA: Diagnosis not present

## 2019-10-24 DIAGNOSIS — N2581 Secondary hyperparathyroidism of renal origin: Secondary | ICD-10-CM | POA: Diagnosis not present

## 2019-10-25 DIAGNOSIS — N2581 Secondary hyperparathyroidism of renal origin: Secondary | ICD-10-CM | POA: Diagnosis not present

## 2019-10-25 DIAGNOSIS — D631 Anemia in chronic kidney disease: Secondary | ICD-10-CM | POA: Diagnosis not present

## 2019-10-25 DIAGNOSIS — D509 Iron deficiency anemia, unspecified: Secondary | ICD-10-CM | POA: Diagnosis not present

## 2019-10-25 DIAGNOSIS — N186 End stage renal disease: Secondary | ICD-10-CM | POA: Diagnosis not present

## 2019-10-25 DIAGNOSIS — Z23 Encounter for immunization: Secondary | ICD-10-CM | POA: Diagnosis not present

## 2019-10-26 DIAGNOSIS — Z23 Encounter for immunization: Secondary | ICD-10-CM | POA: Diagnosis not present

## 2019-10-26 DIAGNOSIS — N186 End stage renal disease: Secondary | ICD-10-CM | POA: Diagnosis not present

## 2019-10-26 DIAGNOSIS — N2581 Secondary hyperparathyroidism of renal origin: Secondary | ICD-10-CM | POA: Diagnosis not present

## 2019-10-26 DIAGNOSIS — D509 Iron deficiency anemia, unspecified: Secondary | ICD-10-CM | POA: Diagnosis not present

## 2019-10-26 DIAGNOSIS — D631 Anemia in chronic kidney disease: Secondary | ICD-10-CM | POA: Diagnosis not present

## 2019-10-27 DIAGNOSIS — N186 End stage renal disease: Secondary | ICD-10-CM | POA: Diagnosis not present

## 2019-10-27 DIAGNOSIS — Z23 Encounter for immunization: Secondary | ICD-10-CM | POA: Diagnosis not present

## 2019-10-27 DIAGNOSIS — D509 Iron deficiency anemia, unspecified: Secondary | ICD-10-CM | POA: Diagnosis not present

## 2019-10-27 DIAGNOSIS — N2581 Secondary hyperparathyroidism of renal origin: Secondary | ICD-10-CM | POA: Diagnosis not present

## 2019-10-27 DIAGNOSIS — D631 Anemia in chronic kidney disease: Secondary | ICD-10-CM | POA: Diagnosis not present

## 2019-10-28 DIAGNOSIS — N186 End stage renal disease: Secondary | ICD-10-CM | POA: Diagnosis not present

## 2019-10-28 DIAGNOSIS — N2581 Secondary hyperparathyroidism of renal origin: Secondary | ICD-10-CM | POA: Diagnosis not present

## 2019-10-28 DIAGNOSIS — D631 Anemia in chronic kidney disease: Secondary | ICD-10-CM | POA: Diagnosis not present

## 2019-10-28 DIAGNOSIS — Z23 Encounter for immunization: Secondary | ICD-10-CM | POA: Diagnosis not present

## 2019-10-28 DIAGNOSIS — D509 Iron deficiency anemia, unspecified: Secondary | ICD-10-CM | POA: Diagnosis not present

## 2019-10-29 DIAGNOSIS — N186 End stage renal disease: Secondary | ICD-10-CM | POA: Diagnosis not present

## 2019-10-29 DIAGNOSIS — N2581 Secondary hyperparathyroidism of renal origin: Secondary | ICD-10-CM | POA: Diagnosis not present

## 2019-10-29 DIAGNOSIS — Z23 Encounter for immunization: Secondary | ICD-10-CM | POA: Diagnosis not present

## 2019-10-29 DIAGNOSIS — D509 Iron deficiency anemia, unspecified: Secondary | ICD-10-CM | POA: Diagnosis not present

## 2019-10-29 DIAGNOSIS — D631 Anemia in chronic kidney disease: Secondary | ICD-10-CM | POA: Diagnosis not present

## 2019-10-30 DIAGNOSIS — D631 Anemia in chronic kidney disease: Secondary | ICD-10-CM | POA: Diagnosis not present

## 2019-10-30 DIAGNOSIS — D509 Iron deficiency anemia, unspecified: Secondary | ICD-10-CM | POA: Diagnosis not present

## 2019-10-30 DIAGNOSIS — N2581 Secondary hyperparathyroidism of renal origin: Secondary | ICD-10-CM | POA: Diagnosis not present

## 2019-10-30 DIAGNOSIS — N186 End stage renal disease: Secondary | ICD-10-CM | POA: Diagnosis not present

## 2019-10-30 DIAGNOSIS — Z23 Encounter for immunization: Secondary | ICD-10-CM | POA: Diagnosis not present

## 2019-10-31 DIAGNOSIS — D509 Iron deficiency anemia, unspecified: Secondary | ICD-10-CM | POA: Diagnosis not present

## 2019-10-31 DIAGNOSIS — Z992 Dependence on renal dialysis: Secondary | ICD-10-CM | POA: Diagnosis not present

## 2019-10-31 DIAGNOSIS — N2581 Secondary hyperparathyroidism of renal origin: Secondary | ICD-10-CM | POA: Diagnosis not present

## 2019-10-31 DIAGNOSIS — Z23 Encounter for immunization: Secondary | ICD-10-CM | POA: Diagnosis not present

## 2019-10-31 DIAGNOSIS — D631 Anemia in chronic kidney disease: Secondary | ICD-10-CM | POA: Diagnosis not present

## 2019-10-31 DIAGNOSIS — N186 End stage renal disease: Secondary | ICD-10-CM | POA: Diagnosis not present

## 2019-11-01 DIAGNOSIS — N186 End stage renal disease: Secondary | ICD-10-CM | POA: Diagnosis not present

## 2019-11-01 DIAGNOSIS — D509 Iron deficiency anemia, unspecified: Secondary | ICD-10-CM | POA: Diagnosis not present

## 2019-11-01 DIAGNOSIS — N2581 Secondary hyperparathyroidism of renal origin: Secondary | ICD-10-CM | POA: Diagnosis not present

## 2019-11-01 DIAGNOSIS — D631 Anemia in chronic kidney disease: Secondary | ICD-10-CM | POA: Diagnosis not present

## 2019-11-02 DIAGNOSIS — D631 Anemia in chronic kidney disease: Secondary | ICD-10-CM | POA: Diagnosis not present

## 2019-11-02 DIAGNOSIS — D509 Iron deficiency anemia, unspecified: Secondary | ICD-10-CM | POA: Diagnosis not present

## 2019-11-02 DIAGNOSIS — N2581 Secondary hyperparathyroidism of renal origin: Secondary | ICD-10-CM | POA: Diagnosis not present

## 2019-11-02 DIAGNOSIS — N186 End stage renal disease: Secondary | ICD-10-CM | POA: Diagnosis not present

## 2019-11-03 DIAGNOSIS — N2581 Secondary hyperparathyroidism of renal origin: Secondary | ICD-10-CM | POA: Diagnosis not present

## 2019-11-03 DIAGNOSIS — N186 End stage renal disease: Secondary | ICD-10-CM | POA: Diagnosis not present

## 2019-11-03 DIAGNOSIS — D509 Iron deficiency anemia, unspecified: Secondary | ICD-10-CM | POA: Diagnosis not present

## 2019-11-03 DIAGNOSIS — D631 Anemia in chronic kidney disease: Secondary | ICD-10-CM | POA: Diagnosis not present

## 2019-11-04 DIAGNOSIS — D631 Anemia in chronic kidney disease: Secondary | ICD-10-CM | POA: Diagnosis not present

## 2019-11-04 DIAGNOSIS — D509 Iron deficiency anemia, unspecified: Secondary | ICD-10-CM | POA: Diagnosis not present

## 2019-11-04 DIAGNOSIS — N186 End stage renal disease: Secondary | ICD-10-CM | POA: Diagnosis not present

## 2019-11-04 DIAGNOSIS — N2581 Secondary hyperparathyroidism of renal origin: Secondary | ICD-10-CM | POA: Diagnosis not present

## 2019-11-05 DIAGNOSIS — N2581 Secondary hyperparathyroidism of renal origin: Secondary | ICD-10-CM | POA: Diagnosis not present

## 2019-11-05 DIAGNOSIS — D509 Iron deficiency anemia, unspecified: Secondary | ICD-10-CM | POA: Diagnosis not present

## 2019-11-05 DIAGNOSIS — D631 Anemia in chronic kidney disease: Secondary | ICD-10-CM | POA: Diagnosis not present

## 2019-11-05 DIAGNOSIS — N186 End stage renal disease: Secondary | ICD-10-CM | POA: Diagnosis not present

## 2019-11-06 DIAGNOSIS — D509 Iron deficiency anemia, unspecified: Secondary | ICD-10-CM | POA: Diagnosis not present

## 2019-11-06 DIAGNOSIS — D631 Anemia in chronic kidney disease: Secondary | ICD-10-CM | POA: Diagnosis not present

## 2019-11-06 DIAGNOSIS — N2581 Secondary hyperparathyroidism of renal origin: Secondary | ICD-10-CM | POA: Diagnosis not present

## 2019-11-06 DIAGNOSIS — N186 End stage renal disease: Secondary | ICD-10-CM | POA: Diagnosis not present

## 2019-11-07 DIAGNOSIS — D631 Anemia in chronic kidney disease: Secondary | ICD-10-CM | POA: Diagnosis not present

## 2019-11-07 DIAGNOSIS — N2581 Secondary hyperparathyroidism of renal origin: Secondary | ICD-10-CM | POA: Diagnosis not present

## 2019-11-07 DIAGNOSIS — D509 Iron deficiency anemia, unspecified: Secondary | ICD-10-CM | POA: Diagnosis not present

## 2019-11-07 DIAGNOSIS — N186 End stage renal disease: Secondary | ICD-10-CM | POA: Diagnosis not present

## 2019-11-08 DIAGNOSIS — D509 Iron deficiency anemia, unspecified: Secondary | ICD-10-CM | POA: Diagnosis not present

## 2019-11-08 DIAGNOSIS — N2581 Secondary hyperparathyroidism of renal origin: Secondary | ICD-10-CM | POA: Diagnosis not present

## 2019-11-08 DIAGNOSIS — N186 End stage renal disease: Secondary | ICD-10-CM | POA: Diagnosis not present

## 2019-11-08 DIAGNOSIS — D631 Anemia in chronic kidney disease: Secondary | ICD-10-CM | POA: Diagnosis not present

## 2019-11-09 DIAGNOSIS — N186 End stage renal disease: Secondary | ICD-10-CM | POA: Diagnosis not present

## 2019-11-09 DIAGNOSIS — D631 Anemia in chronic kidney disease: Secondary | ICD-10-CM | POA: Diagnosis not present

## 2019-11-09 DIAGNOSIS — N2581 Secondary hyperparathyroidism of renal origin: Secondary | ICD-10-CM | POA: Diagnosis not present

## 2019-11-09 DIAGNOSIS — D509 Iron deficiency anemia, unspecified: Secondary | ICD-10-CM | POA: Diagnosis not present

## 2019-11-10 DIAGNOSIS — N186 End stage renal disease: Secondary | ICD-10-CM | POA: Diagnosis not present

## 2019-11-10 DIAGNOSIS — N2581 Secondary hyperparathyroidism of renal origin: Secondary | ICD-10-CM | POA: Diagnosis not present

## 2019-11-10 DIAGNOSIS — D509 Iron deficiency anemia, unspecified: Secondary | ICD-10-CM | POA: Diagnosis not present

## 2019-11-10 DIAGNOSIS — D631 Anemia in chronic kidney disease: Secondary | ICD-10-CM | POA: Diagnosis not present

## 2019-11-11 ENCOUNTER — Ambulatory Visit: Payer: Medicare PPO | Admitting: Podiatry

## 2019-11-11 ENCOUNTER — Other Ambulatory Visit: Payer: Self-pay

## 2019-11-11 DIAGNOSIS — M2011 Hallux valgus (acquired), right foot: Secondary | ICD-10-CM

## 2019-11-11 DIAGNOSIS — D509 Iron deficiency anemia, unspecified: Secondary | ICD-10-CM | POA: Diagnosis not present

## 2019-11-11 DIAGNOSIS — N186 End stage renal disease: Secondary | ICD-10-CM | POA: Diagnosis not present

## 2019-11-11 DIAGNOSIS — L84 Corns and callosities: Secondary | ICD-10-CM

## 2019-11-11 DIAGNOSIS — D631 Anemia in chronic kidney disease: Secondary | ICD-10-CM | POA: Diagnosis not present

## 2019-11-11 DIAGNOSIS — N2581 Secondary hyperparathyroidism of renal origin: Secondary | ICD-10-CM | POA: Diagnosis not present

## 2019-11-11 NOTE — Progress Notes (Signed)
Subjective:   Patient ID: April Ward, female   DOB: 59 y.o.   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

## 2019-11-12 DIAGNOSIS — E46 Unspecified protein-calorie malnutrition: Secondary | ICD-10-CM | POA: Diagnosis not present

## 2019-11-12 DIAGNOSIS — N186 End stage renal disease: Secondary | ICD-10-CM | POA: Diagnosis not present

## 2019-11-12 DIAGNOSIS — N2581 Secondary hyperparathyroidism of renal origin: Secondary | ICD-10-CM | POA: Diagnosis not present

## 2019-11-12 DIAGNOSIS — D631 Anemia in chronic kidney disease: Secondary | ICD-10-CM | POA: Diagnosis not present

## 2019-11-12 DIAGNOSIS — D509 Iron deficiency anemia, unspecified: Secondary | ICD-10-CM | POA: Diagnosis not present

## 2019-11-13 DIAGNOSIS — N186 End stage renal disease: Secondary | ICD-10-CM | POA: Diagnosis not present

## 2019-11-13 DIAGNOSIS — D509 Iron deficiency anemia, unspecified: Secondary | ICD-10-CM | POA: Diagnosis not present

## 2019-11-13 DIAGNOSIS — D631 Anemia in chronic kidney disease: Secondary | ICD-10-CM | POA: Diagnosis not present

## 2019-11-13 DIAGNOSIS — N2581 Secondary hyperparathyroidism of renal origin: Secondary | ICD-10-CM | POA: Diagnosis not present

## 2019-11-14 DIAGNOSIS — N2581 Secondary hyperparathyroidism of renal origin: Secondary | ICD-10-CM | POA: Diagnosis not present

## 2019-11-14 DIAGNOSIS — N186 End stage renal disease: Secondary | ICD-10-CM | POA: Diagnosis not present

## 2019-11-14 DIAGNOSIS — D509 Iron deficiency anemia, unspecified: Secondary | ICD-10-CM | POA: Diagnosis not present

## 2019-11-14 DIAGNOSIS — D631 Anemia in chronic kidney disease: Secondary | ICD-10-CM | POA: Diagnosis not present

## 2019-11-15 DIAGNOSIS — D509 Iron deficiency anemia, unspecified: Secondary | ICD-10-CM | POA: Diagnosis not present

## 2019-11-15 DIAGNOSIS — N186 End stage renal disease: Secondary | ICD-10-CM | POA: Diagnosis not present

## 2019-11-15 DIAGNOSIS — N2581 Secondary hyperparathyroidism of renal origin: Secondary | ICD-10-CM | POA: Diagnosis not present

## 2019-11-15 DIAGNOSIS — D631 Anemia in chronic kidney disease: Secondary | ICD-10-CM | POA: Diagnosis not present

## 2019-11-16 DIAGNOSIS — D509 Iron deficiency anemia, unspecified: Secondary | ICD-10-CM | POA: Diagnosis not present

## 2019-11-16 DIAGNOSIS — D631 Anemia in chronic kidney disease: Secondary | ICD-10-CM | POA: Diagnosis not present

## 2019-11-16 DIAGNOSIS — N186 End stage renal disease: Secondary | ICD-10-CM | POA: Diagnosis not present

## 2019-11-16 DIAGNOSIS — N2581 Secondary hyperparathyroidism of renal origin: Secondary | ICD-10-CM | POA: Diagnosis not present

## 2019-11-17 DIAGNOSIS — D509 Iron deficiency anemia, unspecified: Secondary | ICD-10-CM | POA: Diagnosis not present

## 2019-11-17 DIAGNOSIS — N2581 Secondary hyperparathyroidism of renal origin: Secondary | ICD-10-CM | POA: Diagnosis not present

## 2019-11-17 DIAGNOSIS — N186 End stage renal disease: Secondary | ICD-10-CM | POA: Diagnosis not present

## 2019-11-17 DIAGNOSIS — D631 Anemia in chronic kidney disease: Secondary | ICD-10-CM | POA: Diagnosis not present

## 2019-11-18 DIAGNOSIS — N186 End stage renal disease: Secondary | ICD-10-CM | POA: Diagnosis not present

## 2019-11-18 DIAGNOSIS — D631 Anemia in chronic kidney disease: Secondary | ICD-10-CM | POA: Diagnosis not present

## 2019-11-18 DIAGNOSIS — D509 Iron deficiency anemia, unspecified: Secondary | ICD-10-CM | POA: Diagnosis not present

## 2019-11-18 DIAGNOSIS — N2581 Secondary hyperparathyroidism of renal origin: Secondary | ICD-10-CM | POA: Diagnosis not present

## 2019-11-19 DIAGNOSIS — D631 Anemia in chronic kidney disease: Secondary | ICD-10-CM | POA: Diagnosis not present

## 2019-11-19 DIAGNOSIS — N186 End stage renal disease: Secondary | ICD-10-CM | POA: Diagnosis not present

## 2019-11-19 DIAGNOSIS — D509 Iron deficiency anemia, unspecified: Secondary | ICD-10-CM | POA: Diagnosis not present

## 2019-11-19 DIAGNOSIS — N2581 Secondary hyperparathyroidism of renal origin: Secondary | ICD-10-CM | POA: Diagnosis not present

## 2019-11-20 DIAGNOSIS — D631 Anemia in chronic kidney disease: Secondary | ICD-10-CM | POA: Diagnosis not present

## 2019-11-20 DIAGNOSIS — N2581 Secondary hyperparathyroidism of renal origin: Secondary | ICD-10-CM | POA: Diagnosis not present

## 2019-11-20 DIAGNOSIS — D509 Iron deficiency anemia, unspecified: Secondary | ICD-10-CM | POA: Diagnosis not present

## 2019-11-20 DIAGNOSIS — N186 End stage renal disease: Secondary | ICD-10-CM | POA: Diagnosis not present

## 2019-11-21 DIAGNOSIS — D631 Anemia in chronic kidney disease: Secondary | ICD-10-CM | POA: Diagnosis not present

## 2019-11-21 DIAGNOSIS — D509 Iron deficiency anemia, unspecified: Secondary | ICD-10-CM | POA: Diagnosis not present

## 2019-11-21 DIAGNOSIS — N2581 Secondary hyperparathyroidism of renal origin: Secondary | ICD-10-CM | POA: Diagnosis not present

## 2019-11-21 DIAGNOSIS — N186 End stage renal disease: Secondary | ICD-10-CM | POA: Diagnosis not present

## 2019-11-22 DIAGNOSIS — D509 Iron deficiency anemia, unspecified: Secondary | ICD-10-CM | POA: Diagnosis not present

## 2019-11-22 DIAGNOSIS — N186 End stage renal disease: Secondary | ICD-10-CM | POA: Diagnosis not present

## 2019-11-22 DIAGNOSIS — N2581 Secondary hyperparathyroidism of renal origin: Secondary | ICD-10-CM | POA: Diagnosis not present

## 2019-11-22 DIAGNOSIS — D631 Anemia in chronic kidney disease: Secondary | ICD-10-CM | POA: Diagnosis not present

## 2019-11-23 DIAGNOSIS — D631 Anemia in chronic kidney disease: Secondary | ICD-10-CM | POA: Diagnosis not present

## 2019-11-23 DIAGNOSIS — N186 End stage renal disease: Secondary | ICD-10-CM | POA: Diagnosis not present

## 2019-11-23 DIAGNOSIS — N2581 Secondary hyperparathyroidism of renal origin: Secondary | ICD-10-CM | POA: Diagnosis not present

## 2019-11-23 DIAGNOSIS — D509 Iron deficiency anemia, unspecified: Secondary | ICD-10-CM | POA: Diagnosis not present

## 2019-11-24 DIAGNOSIS — D631 Anemia in chronic kidney disease: Secondary | ICD-10-CM | POA: Diagnosis not present

## 2019-11-24 DIAGNOSIS — D509 Iron deficiency anemia, unspecified: Secondary | ICD-10-CM | POA: Diagnosis not present

## 2019-11-24 DIAGNOSIS — N2581 Secondary hyperparathyroidism of renal origin: Secondary | ICD-10-CM | POA: Diagnosis not present

## 2019-11-24 DIAGNOSIS — N186 End stage renal disease: Secondary | ICD-10-CM | POA: Diagnosis not present

## 2019-11-25 DIAGNOSIS — D509 Iron deficiency anemia, unspecified: Secondary | ICD-10-CM | POA: Diagnosis not present

## 2019-11-25 DIAGNOSIS — D631 Anemia in chronic kidney disease: Secondary | ICD-10-CM | POA: Diagnosis not present

## 2019-11-25 DIAGNOSIS — N186 End stage renal disease: Secondary | ICD-10-CM | POA: Diagnosis not present

## 2019-11-25 DIAGNOSIS — N2581 Secondary hyperparathyroidism of renal origin: Secondary | ICD-10-CM | POA: Diagnosis not present

## 2019-11-26 DIAGNOSIS — D509 Iron deficiency anemia, unspecified: Secondary | ICD-10-CM | POA: Diagnosis not present

## 2019-11-26 DIAGNOSIS — N186 End stage renal disease: Secondary | ICD-10-CM | POA: Diagnosis not present

## 2019-11-26 DIAGNOSIS — N2581 Secondary hyperparathyroidism of renal origin: Secondary | ICD-10-CM | POA: Diagnosis not present

## 2019-11-26 DIAGNOSIS — D631 Anemia in chronic kidney disease: Secondary | ICD-10-CM | POA: Diagnosis not present

## 2019-11-27 DIAGNOSIS — D509 Iron deficiency anemia, unspecified: Secondary | ICD-10-CM | POA: Diagnosis not present

## 2019-11-27 DIAGNOSIS — D631 Anemia in chronic kidney disease: Secondary | ICD-10-CM | POA: Diagnosis not present

## 2019-11-27 DIAGNOSIS — N2581 Secondary hyperparathyroidism of renal origin: Secondary | ICD-10-CM | POA: Diagnosis not present

## 2019-11-27 DIAGNOSIS — N186 End stage renal disease: Secondary | ICD-10-CM | POA: Diagnosis not present

## 2019-11-28 DIAGNOSIS — D631 Anemia in chronic kidney disease: Secondary | ICD-10-CM | POA: Diagnosis not present

## 2019-11-28 DIAGNOSIS — D509 Iron deficiency anemia, unspecified: Secondary | ICD-10-CM | POA: Diagnosis not present

## 2019-11-28 DIAGNOSIS — N2581 Secondary hyperparathyroidism of renal origin: Secondary | ICD-10-CM | POA: Diagnosis not present

## 2019-11-28 DIAGNOSIS — N186 End stage renal disease: Secondary | ICD-10-CM | POA: Diagnosis not present

## 2019-11-29 DIAGNOSIS — D631 Anemia in chronic kidney disease: Secondary | ICD-10-CM | POA: Diagnosis not present

## 2019-11-29 DIAGNOSIS — N186 End stage renal disease: Secondary | ICD-10-CM | POA: Diagnosis not present

## 2019-11-29 DIAGNOSIS — D509 Iron deficiency anemia, unspecified: Secondary | ICD-10-CM | POA: Diagnosis not present

## 2019-11-29 DIAGNOSIS — N2581 Secondary hyperparathyroidism of renal origin: Secondary | ICD-10-CM | POA: Diagnosis not present

## 2019-11-30 DIAGNOSIS — N2581 Secondary hyperparathyroidism of renal origin: Secondary | ICD-10-CM | POA: Diagnosis not present

## 2019-11-30 DIAGNOSIS — D631 Anemia in chronic kidney disease: Secondary | ICD-10-CM | POA: Diagnosis not present

## 2019-11-30 DIAGNOSIS — D509 Iron deficiency anemia, unspecified: Secondary | ICD-10-CM | POA: Diagnosis not present

## 2019-11-30 DIAGNOSIS — N186 End stage renal disease: Secondary | ICD-10-CM | POA: Diagnosis not present

## 2019-12-01 DIAGNOSIS — N186 End stage renal disease: Secondary | ICD-10-CM | POA: Diagnosis not present

## 2019-12-01 DIAGNOSIS — D509 Iron deficiency anemia, unspecified: Secondary | ICD-10-CM | POA: Diagnosis not present

## 2019-12-01 DIAGNOSIS — D631 Anemia in chronic kidney disease: Secondary | ICD-10-CM | POA: Diagnosis not present

## 2019-12-01 DIAGNOSIS — N2581 Secondary hyperparathyroidism of renal origin: Secondary | ICD-10-CM | POA: Diagnosis not present

## 2019-12-01 DIAGNOSIS — Z992 Dependence on renal dialysis: Secondary | ICD-10-CM | POA: Diagnosis not present

## 2019-12-02 DIAGNOSIS — D631 Anemia in chronic kidney disease: Secondary | ICD-10-CM | POA: Diagnosis not present

## 2019-12-02 DIAGNOSIS — N186 End stage renal disease: Secondary | ICD-10-CM | POA: Diagnosis not present

## 2019-12-02 DIAGNOSIS — D509 Iron deficiency anemia, unspecified: Secondary | ICD-10-CM | POA: Diagnosis not present

## 2019-12-02 DIAGNOSIS — N2581 Secondary hyperparathyroidism of renal origin: Secondary | ICD-10-CM | POA: Diagnosis not present

## 2019-12-03 DIAGNOSIS — D509 Iron deficiency anemia, unspecified: Secondary | ICD-10-CM | POA: Diagnosis not present

## 2019-12-03 DIAGNOSIS — N2581 Secondary hyperparathyroidism of renal origin: Secondary | ICD-10-CM | POA: Diagnosis not present

## 2019-12-03 DIAGNOSIS — D631 Anemia in chronic kidney disease: Secondary | ICD-10-CM | POA: Diagnosis not present

## 2019-12-03 DIAGNOSIS — N186 End stage renal disease: Secondary | ICD-10-CM | POA: Diagnosis not present

## 2019-12-04 DIAGNOSIS — D631 Anemia in chronic kidney disease: Secondary | ICD-10-CM | POA: Diagnosis not present

## 2019-12-04 DIAGNOSIS — N2581 Secondary hyperparathyroidism of renal origin: Secondary | ICD-10-CM | POA: Diagnosis not present

## 2019-12-04 DIAGNOSIS — N186 End stage renal disease: Secondary | ICD-10-CM | POA: Diagnosis not present

## 2019-12-04 DIAGNOSIS — D509 Iron deficiency anemia, unspecified: Secondary | ICD-10-CM | POA: Diagnosis not present

## 2019-12-05 DIAGNOSIS — N186 End stage renal disease: Secondary | ICD-10-CM | POA: Diagnosis not present

## 2019-12-05 DIAGNOSIS — N2581 Secondary hyperparathyroidism of renal origin: Secondary | ICD-10-CM | POA: Diagnosis not present

## 2019-12-05 DIAGNOSIS — D509 Iron deficiency anemia, unspecified: Secondary | ICD-10-CM | POA: Diagnosis not present

## 2019-12-05 DIAGNOSIS — D631 Anemia in chronic kidney disease: Secondary | ICD-10-CM | POA: Diagnosis not present

## 2019-12-06 DIAGNOSIS — N2581 Secondary hyperparathyroidism of renal origin: Secondary | ICD-10-CM | POA: Diagnosis not present

## 2019-12-06 DIAGNOSIS — N186 End stage renal disease: Secondary | ICD-10-CM | POA: Diagnosis not present

## 2019-12-06 DIAGNOSIS — D509 Iron deficiency anemia, unspecified: Secondary | ICD-10-CM | POA: Diagnosis not present

## 2019-12-06 DIAGNOSIS — D631 Anemia in chronic kidney disease: Secondary | ICD-10-CM | POA: Diagnosis not present

## 2019-12-07 DIAGNOSIS — D509 Iron deficiency anemia, unspecified: Secondary | ICD-10-CM | POA: Diagnosis not present

## 2019-12-07 DIAGNOSIS — N186 End stage renal disease: Secondary | ICD-10-CM | POA: Diagnosis not present

## 2019-12-07 DIAGNOSIS — D631 Anemia in chronic kidney disease: Secondary | ICD-10-CM | POA: Diagnosis not present

## 2019-12-07 DIAGNOSIS — N2581 Secondary hyperparathyroidism of renal origin: Secondary | ICD-10-CM | POA: Diagnosis not present

## 2019-12-08 ENCOUNTER — Ambulatory Visit: Payer: Medicare PPO | Admitting: Cardiology

## 2019-12-08 ENCOUNTER — Encounter: Payer: Self-pay | Admitting: Cardiology

## 2019-12-08 VITALS — BP 140/68 | HR 65 | Ht 71.0 in | Wt 214.2 lb

## 2019-12-08 DIAGNOSIS — I350 Nonrheumatic aortic (valve) stenosis: Secondary | ICD-10-CM | POA: Diagnosis not present

## 2019-12-08 DIAGNOSIS — D631 Anemia in chronic kidney disease: Secondary | ICD-10-CM | POA: Diagnosis not present

## 2019-12-08 DIAGNOSIS — I10 Essential (primary) hypertension: Secondary | ICD-10-CM | POA: Diagnosis not present

## 2019-12-08 DIAGNOSIS — T161XXA Foreign body in right ear, initial encounter: Secondary | ICD-10-CM | POA: Diagnosis not present

## 2019-12-08 DIAGNOSIS — D509 Iron deficiency anemia, unspecified: Secondary | ICD-10-CM | POA: Diagnosis not present

## 2019-12-08 DIAGNOSIS — N2581 Secondary hyperparathyroidism of renal origin: Secondary | ICD-10-CM | POA: Diagnosis not present

## 2019-12-08 DIAGNOSIS — N186 End stage renal disease: Secondary | ICD-10-CM | POA: Diagnosis not present

## 2019-12-08 MED ORDER — LOSARTAN POTASSIUM 100 MG PO TABS
100.0000 mg | ORAL_TABLET | Freq: Every day | ORAL | 1 refills | Status: DC
Start: 2019-12-08 — End: 2020-05-09

## 2019-12-08 NOTE — Progress Notes (Signed)
Clinical Summary April Ward is a 59 y.o.female seen today for follow up of the following medical problems.   1. HTN  - reports diagnosed with HTN age 36. Historically has been difficult to control - CT scan in 2006 with normal renal arteries. TSH 1.80. No EtoH. Normal renin and aldo ratio. Normal sleep study 10/2013 at [facility].  - since gastric sleeve surgery she has had significant weight loss, bp's have trended down and she has been taken off some of her bp meds  - home bp's 140s/80s, overall trending up. Has had 16 lbs weight gain since Jan 2021  3. Obesity - had gastric sleeve procedure done, significant weight loss since that time.   4.ESRD - followed Dr Kris Mouton at [facility].  - on home peritoneal HD  - no recent issues   5. History of renal cell CA - s/p left partial nephrectomy in 2014   6. 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 Anxiety   . Arthritis   . Depression   . Diabetes mellitus without complication ([place])   . GERD (gastroesophageal reflux disease)   . Gout   . Heart murmur   . Hypertension   . Renal cancer ([place])   . Renal disease 10/2012  . Renal insufficiency      Allergies  Allergen Reactions  . Skelaxin [Metaxalone] Hives and Rash     Current Outpatient Medications  Medication Sig Dispense Refill  . ALPRAZolam (XANAX) 1 MG tablet Take 1 tablet (1 mg total) by mouth 4 (four) times daily. 120 tablet 2  . amLODipine (NORVASC) 10 MG tablet Take 1 tablet by mouth daily.    Marland Kitchen buPROPion (WELLBUTRIN XL) 300 MG 24 hr tablet Take 1 tablet (300 mg total) by mouth every morning. 90 tablet 2  . calcitRIOL (ROCALTROL) 0.25 MCG capsule Take 1 capsule by mouth 3 (three) times a week. M-W-F    . hydrALAZINE (APRESOLINE) 100 MG tablet TAKE 1 TABLET THREE TIMES DAILY 270 tablet 2  .  lactulose (CONSTULOSE) 10 GM/15ML solution Take 10 mLs by mouth 2 (two) times daily as needed for constipation.    Marland Kitchen levothyroxine (SYNTHROID, LEVOTHROID) 200 MCG tablet Take 1 tablet by mouth daily.    Marland Kitchen losartan (COZAAR) 50 MG tablet TAKE 1 TABLET (50 MG TOTAL) BY MOUTH DAILY. 90 tablet 1  . meclizine (ANTIVERT) 25 MG tablet Take 1 tablet (25 mg total) by mouth every 8 (eight) hours as needed for dizziness. 90 tablet 0  . meloxicam (MOBIC) 7.5 MG tablet Take 1 tablet (7.5 mg total) by mouth daily. 30 tablet 5  . multivitamin (RENA-VIT) TABS tablet Take 1 tablet by mouth daily.    . prazosin (MINIPRESS) 5 MG capsule Take 1 capsule (5 mg total) by mouth at bedtime. 90 capsule 2  . sertraline (ZOLOFT) 100 MG tablet Take 1 tablet (100 mg total) by mouth daily. 90 tablet 2  . sevelamer carbonate (RENVELA) 800 MG tablet Take 800 mg by mouth daily.     Marland Kitchen tiZANidine (ZANAFLEX) 4 MG tablet Take 1 tablet (4 mg total) by mouth daily. 30 tablet 1  . vitamin B-12 (CYANOCOBALAMIN) 500 MCG tablet Take 500 mcg by mouth daily.     No current facility-administered medications for this visit.     Past Surgical History:  Procedure Laterality Date  . CESAREAN SECTION  [id number]  . CHONDROPLASTY Right 08/22/2012   Procedure: CHONDROPLASTY;  Surgeon: Carole Civil, MD;  Location: [facility];  Service: Orthopedics;  Laterality: Right;  . COLONOSCOPY WITH PROPOFOL N/A 03/16/2014   Procedure: COLONOSCOPY WITH PROPOFOL (at cecum 1023, total withdrawal time=9 minutes);  Surgeon: Danie Binder, MD;  Location: [facility];  Service: Endoscopy;  Laterality: N/A;  . GASTRIC BYPASS    . KNEE ARTHROSCOPY WITH MEDIAL MENISECTOMY Right 08/22/2012   Procedure: KNEE ARTHROSCOPY WITH PARTIAL MEDIAL MENISECTOMY;  Surgeon: Carole Civil, MD;  Location: [facility];  Service: Orthopedics;  Laterality: Right;  . PARTIAL NEPHRECTOMY  Dec 2014   left  . TENDON REPAIR       Allergies  Allergen Reactions  . Skelaxin [Metaxalone]  Hives and Rash      Family History  Problem Relation Age of Onset  . Heart attack Mother   . Heart attack Father   . Depression Paternal Aunt   . Alcohol abuse Maternal Uncle   . Colon cancer Maternal Aunt   . Colon cancer Maternal Uncle      Social History April Ward reports that she has never smoked. She has never used smokeless tobacco. April Ward reports no history of alcohol use.   Review of Systems CONSTITUTIONAL: No weight loss, fever, chills, weakness or fatigue.  HEENT: Eyes: No visual loss, blurred vision, double vision or yellow sclerae.No hearing loss, sneezing, congestion, runny nose or sore throat.  SKIN: No rash or itching.  CARDIOVASCULAR: per hpi RESPIRATORY: No shortness of breath, cough or sputum.  GASTROINTESTINAL: No anorexia, nausea, vomiting or diarrhea. No abdominal pain or blood.  GENITOURINARY: No burning on urination, no polyuria NEUROLOGICAL: No headache, dizziness, syncope, paralysis, ataxia, numbness or tingling in the extremities. No change in bowel or bladder control.  MUSCULOSKELETAL: No muscle, back pain, joint pain or stiffness.  LYMPHATICS: No enlarged nodes. No history of splenectomy.  PSYCHIATRIC: No history of depression or anxiety.  ENDOCRINOLOGIC: No reports of sweating, cold or heat intolerance. No polyuria or polydipsia.  Marland Kitchen   Physical Examination Today's Vitals   12/08/19 1120  BP: 140/68  Pulse: 65  SpO2: 97%  Weight: 214 lb 3.2 oz (97.2 kg)  Height: 5\' 11"  (1.803 m)   Body mass index is 29.87 kg/m.  Gen: resting comfortably, no acute distress HEENT: no scleral icterus, pupils equal round and reactive, no palptable cervical adenopathy,  CV: RRR, 3/6 systolic murmur rusb, no jvd Resp: Clear to auscultation bilaterally GI: abdomen is soft, non-tender, non-distended, normal bowel sounds, no hepatosplenomegaly MSK: extremities are warm, no edema.  Skin: warm, no rash Neuro:  no focal deficits Psych: appropriate  affect   Diagnostic Studies  09/2013 Carotid US 1-39% bilateral ICA disease  09/2013 Exercise MPI Overall low risk exercise/Lexiscan Cardiolite. Patient had limited exercise capacity achieving maximum work load of 7 METS, limited by shortness of breath and hypertensive response. There were no clearly diagnostic ST segment abnormalities. Occasional to frequent PVCs and ventricular couplets were noted early in exercise. There were no sustained arrhythmias. Perfusion imaging shows probable variable soft tissue attenuation, less likely a minor degree of basal lateral ischemia. LVEF is normal at 61% with upper normal chamber volume and no obvious wall motion abnormalities.   Jan 2020 Stress echo [facility] eval NORMAL STRESS TEST.  NO VALVULAR REGURGITATION  NO VALVULAR STENOSIS  Maximum workload of 10.10 METs was achieved during exercise.  RESTING HYPERTENSION - EXAGGERATED  RESPONSE   Jan 2020 echo NORMAL LEFT VENTRICULAR SYSTOLIC FUNCTION WITH MODERATE LVH  NORMAL RIGHT VENTRICULAR SYSTOLIC FUNCTION  VALVULAR REGURGITATION: MILD MR, TRIVIAL PR, TRIVIAL TR  VALVULAR STENOSIS: TRIVIAL AS  NO PRIOR STUDY FOR COMPARISON  3D acquisition and reconstructions were performed as part of this  examination to more accurately quantify the effects of heart failure  regardless of ejection fraction. (post-processing on an Independent  workstation).   05/2019 INTERPRETATION ---------------------------------------------------------------   INDETERMINATE. NORMAL RESTING STUDY WITH NO WALL MOTION ABNORMALITIES AT  REST AND PEAK STRESS.  NORMAL LA PRESSURES WITH NORMAL DIASTOLIC FUNCTION  VALVULAR REGURGITATION: TRIVIAL MR, MILD PR, TRIVIAL TR  VALVULAR STENOSIS: MILD AS  Note: C/W PRIOR ECHO 04/16/18: INCREASED AS GRADIENT  Maximum workload of 8.50 METs was achieved during exercise.  RESTING HYPERTENSION - APPROPRIATE RESPONSE   Assessment and Plan   1. HTN -  bp's above goal, likely have trended up due to weight gain this year - increase losartan to 100 mg daily, has labs coming up with nephrology already  2. Aortic stenosis -  Mild by recent [facility] echo, contineu to monitor   EKG today shows SR, no ischemic changes   F/u 1 year  Arnoldo Lenis, M.D.

## 2019-12-08 NOTE — Patient Instructions (Signed)
Your physician wants you to follow-up in: [place] will receive a reminder letter in the mail two months in advance. If you don't receive a letter, please call our office to schedule the follow-up appointment.  Your physician has recommended you make the following change in your medication:   INCREASE LOSARTAN 100 MG DAILY   Thank you for choosing [facility]!!

## 2019-12-09 DIAGNOSIS — N2581 Secondary hyperparathyroidism of renal origin: Secondary | ICD-10-CM | POA: Diagnosis not present

## 2019-12-09 DIAGNOSIS — D509 Iron deficiency anemia, unspecified: Secondary | ICD-10-CM | POA: Diagnosis not present

## 2019-12-09 DIAGNOSIS — N186 End stage renal disease: Secondary | ICD-10-CM | POA: Diagnosis not present

## 2019-12-09 DIAGNOSIS — D631 Anemia in chronic kidney disease: Secondary | ICD-10-CM | POA: Diagnosis not present

## 2019-12-10 DIAGNOSIS — D631 Anemia in chronic kidney disease: Secondary | ICD-10-CM | POA: Diagnosis not present

## 2019-12-10 DIAGNOSIS — D509 Iron deficiency anemia, unspecified: Secondary | ICD-10-CM | POA: Diagnosis not present

## 2019-12-10 DIAGNOSIS — N186 End stage renal disease: Secondary | ICD-10-CM | POA: Diagnosis not present

## 2019-12-10 DIAGNOSIS — N2581 Secondary hyperparathyroidism of renal origin: Secondary | ICD-10-CM | POA: Diagnosis not present

## 2019-12-11 DIAGNOSIS — N186 End stage renal disease: Secondary | ICD-10-CM | POA: Diagnosis not present

## 2019-12-11 DIAGNOSIS — D509 Iron deficiency anemia, unspecified: Secondary | ICD-10-CM | POA: Diagnosis not present

## 2019-12-11 DIAGNOSIS — D631 Anemia in chronic kidney disease: Secondary | ICD-10-CM | POA: Diagnosis not present

## 2019-12-11 DIAGNOSIS — N2581 Secondary hyperparathyroidism of renal origin: Secondary | ICD-10-CM | POA: Diagnosis not present

## 2019-12-12 DIAGNOSIS — N186 End stage renal disease: Secondary | ICD-10-CM | POA: Diagnosis not present

## 2019-12-12 DIAGNOSIS — D631 Anemia in chronic kidney disease: Secondary | ICD-10-CM | POA: Diagnosis not present

## 2019-12-12 DIAGNOSIS — N2581 Secondary hyperparathyroidism of renal origin: Secondary | ICD-10-CM | POA: Diagnosis not present

## 2019-12-12 DIAGNOSIS — D509 Iron deficiency anemia, unspecified: Secondary | ICD-10-CM | POA: Diagnosis not present

## 2019-12-13 DIAGNOSIS — N186 End stage renal disease: Secondary | ICD-10-CM | POA: Diagnosis not present

## 2019-12-13 DIAGNOSIS — D631 Anemia in chronic kidney disease: Secondary | ICD-10-CM | POA: Diagnosis not present

## 2019-12-13 DIAGNOSIS — N2581 Secondary hyperparathyroidism of renal origin: Secondary | ICD-10-CM | POA: Diagnosis not present

## 2019-12-13 DIAGNOSIS — D509 Iron deficiency anemia, unspecified: Secondary | ICD-10-CM | POA: Diagnosis not present

## 2019-12-14 ENCOUNTER — Other Ambulatory Visit: Payer: Self-pay

## 2019-12-14 ENCOUNTER — Encounter (HOSPITAL_COMMUNITY): Payer: Self-pay | Admitting: Psychiatry

## 2019-12-14 ENCOUNTER — Telehealth (INDEPENDENT_AMBULATORY_CARE_PROVIDER_SITE_OTHER): Payer: Medicare PPO | Admitting: Psychiatry

## 2019-12-14 DIAGNOSIS — M5020 Other cervical disc displacement, unspecified cervical region: Secondary | ICD-10-CM | POA: Diagnosis not present

## 2019-12-14 DIAGNOSIS — F411 Generalized anxiety disorder: Secondary | ICD-10-CM

## 2019-12-14 DIAGNOSIS — Z6830 Body mass index (BMI) 30.0-30.9, adult: Secondary | ICD-10-CM | POA: Diagnosis not present

## 2019-12-14 DIAGNOSIS — D509 Iron deficiency anemia, unspecified: Secondary | ICD-10-CM | POA: Diagnosis not present

## 2019-12-14 DIAGNOSIS — M7918 Myalgia, other site: Secondary | ICD-10-CM | POA: Diagnosis not present

## 2019-12-14 DIAGNOSIS — F322 Major depressive disorder, single episode, severe without psychotic features: Secondary | ICD-10-CM | POA: Diagnosis not present

## 2019-12-14 DIAGNOSIS — N2581 Secondary hyperparathyroidism of renal origin: Secondary | ICD-10-CM | POA: Diagnosis not present

## 2019-12-14 DIAGNOSIS — N186 End stage renal disease: Secondary | ICD-10-CM | POA: Diagnosis not present

## 2019-12-14 DIAGNOSIS — D631 Anemia in chronic kidney disease: Secondary | ICD-10-CM | POA: Diagnosis not present

## 2019-12-14 DIAGNOSIS — M4802 Spinal stenosis, cervical region: Secondary | ICD-10-CM | POA: Diagnosis not present

## 2019-12-14 MED ORDER — BUPROPION HCL ER (XL) 300 MG PO TB24: 300.0000 mg | ORAL_TABLET | ORAL | 2 refills | Status: DC

## 2019-12-14 MED ORDER — ALPRAZOLAM 1 MG PO TABS: 1.0000 mg | ORAL_TABLET | Freq: Every day | ORAL | 2 refills | Status: DC | PRN

## 2019-12-14 MED ORDER — PRAZOSIN HCL 5 MG PO CAPS: 5.0000 mg | ORAL_CAPSULE | Freq: Every day | ORAL | 2 refills | Status: DC

## 2019-12-14 MED ORDER — SERTRALINE HCL 100 MG PO TABS: 100.0000 mg | ORAL_TABLET | Freq: Every day | ORAL | 2 refills | Status: DC

## 2019-12-14 NOTE — Progress Notes (Signed)
Virtual Visit via Video Note  I connected with April Ward on 12/14/19 at  9:00 AM EDT by a video enabled telemedicine application and verified that I am speaking with the correct person using two identifiers.   I discussed the limitations of evaluation and management by telemedicine and the availability of in person appointments. The patient expressed understanding and agreed to proceed    I discussed the assessment and treatment plan with the patient. The patient was provided an opportunity to ask questions and all were answered. The patient agreed with the plan and demonstrated an understanding of the instructions.   The patient was advised to call back or seek an in-person evaluation if the symptoms worsen or if the condition fails to improve as anticipated.  I provided 15 minutes of non-face-to-face time during this encounter. Location: Provider office, patient home  [name], MD  [facility] MD/PA/NP OP Progress Note  12/14/2019 9:37 AM April Ward  MRN:  [id number]  Chief Complaint:  Chief Complaint    Anxiety; Depression; Follow-up     HPI: This patient is a 59 year old widowed black female who lives with her son in [place].  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  The patient returns for follow-up after about 4 months.  She states for the most part she is doing okay.  However her insurance would not allow her to renew her Wellbutrin and she has been off it for about a week and feels a little bit more depressed.  She denies being suicidal.  She is still using the Xanax even though the renal transplant team does not want her to be on it.  She states without it she has severe panic attacks.  She continues to be on to transplant list.  She was exercising in the gym but her physicians do not think it safe with the dalteparin being out even though she is fully vaccinated.  She has had trouble relying on good weather to allow her to exercise outdoors.  I  explained that we would try to get her Wellbutrin reinstated as soon as possible Visit Diagnosis:    ICD-10-CM   1. Major depressive disorder, single episode, severe without psychotic features ([place])  F32.2   2. GAD (generalized anxiety disorder)  F41.1     Past Psychiatric History: Long-term outpatient treatment  Past Medical History:  Past Medical History:  Diagnosis Date  . Anxiety   . Arthritis   . Depression   . Diabetes mellitus without complication ([place])   . GERD (gastroesophageal reflux disease)   . Gout   . Heart murmur   . Hypertension   . Renal cancer ([place])   . Renal disease 10/2012  . Renal insufficiency     Past Surgical History:  Procedure Laterality Date  . CESAREAN SECTION  [id number]  . CHONDROPLASTY Right 08/22/2012   Procedure: CHONDROPLASTY;  Surgeon: Carole Civil, MD;  Location: [facility];  Service: Orthopedics;  Laterality: Right;  . COLONOSCOPY WITH PROPOFOL N/A 03/16/2014   Procedure: COLONOSCOPY WITH PROPOFOL (at cecum 1023, total withdrawal time=9 minutes);  Surgeon: Danie Binder, MD;  Location: [facility];  Service: Endoscopy;  Laterality: N/A;  . GASTRIC BYPASS    . KNEE ARTHROSCOPY WITH MEDIAL MENISECTOMY Right 08/22/2012   Procedure: KNEE ARTHROSCOPY WITH PARTIAL MEDIAL MENISECTOMY;  Surgeon: Carole Civil, MD;  Location: [facility];  Service: Orthopedics;  Laterality: Right;  . PARTIAL NEPHRECTOMY  Dec 2014  left  . TENDON REPAIR      Family Psychiatric History: see below  Family History:  Family History  Problem Relation Age of Onset  . Heart attack Mother   . Heart attack Father   . Depression Paternal Aunt   . Alcohol abuse Maternal Uncle   . Colon cancer Maternal Aunt   . Colon cancer Maternal Uncle     Social History:  Social History   Socioeconomic History  . Marital status: Widowed    Spouse name: Not on file  . Number of children: Not on file  . Years of education: Not on file  . Highest education level: Not on file   Occupational History  . Occupation: Visual merchandiser: [employer]  Tobacco Use  . Smoking status: Never Smoker  . Smokeless tobacco: Never Used  Substance and Sexual Activity  . Alcohol use: No    Alcohol/week: 0.0 standard drinks  . Drug use: No  . Sexual activity: Not on file  Other Topics Concern  . Not on file  Social History Narrative  . Not on file   Social Determinants of Health   Financial Resource Strain:   . Difficulty of Paying Living Expenses: Not on file  Food Insecurity:   . Worried About Charity fundraiser in the Last Year: Not on file  . Ran Out of Food in the Last Year: Not on file  Transportation Needs:   . Lack of Transportation (Medical): Not on file  . Lack of Transportation (Non-Medical): Not on file  Physical Activity:   . Days of Exercise per Week: Not on file  . Minutes of Exercise per Session: Not on file  Stress:   . Feeling of Stress : Not on file  Social Connections:   . Frequency of Communication with Friends and Family: Not on file  . Frequency of Social Gatherings with Friends and Family: Not on file  . Attends Religious Services: Not on file  . Active Member of Clubs or Organizations: Not on file  . Attends Archivist Meetings: Not on file  . Marital Status: Not on file    Allergies:  Allergies  Allergen Reactions  . Skelaxin [Metaxalone] Hives and Rash    Metabolic Disorder Labs: No results found for: HGBA1C, MPG No results found for: PROLACTIN Lab Results  Component Value Date   CHOL 215 (H) 03/11/2014   TRIG 288 (H) 03/11/2014   HDL 33 (L) 03/11/2014   CHOLHDL 6.5 03/11/2014   VLDL 58 (H) 03/11/2014   LDLCALC 124 (H) 03/11/2014   No results found for: TSH  Therapeutic Level Labs: No results found for: LITHIUM No results found for: VALPROATE No components found for:  CBMZ  Current Medications: Current Outpatient Medications  Medication Sig Dispense Refill  . ALPRAZolam (XANAX) 1  MG tablet Take 1 tablet (1 mg total) by mouth daily as needed for anxiety. 90 tablet 2  . amLODipine (NORVASC) 10 MG tablet Take 1 tablet by mouth daily.    Marland Kitchen buPROPion (WELLBUTRIN XL) 300 MG 24 hr tablet Take 1 tablet (300 mg total) by mouth every morning. 30 tablet 2  . calcitRIOL (ROCALTROL) 0.25 MCG capsule Take 1 capsule by mouth. 6 times weekly - Monday thru Saturday    . hydrALAZINE (APRESOLINE) 100 MG tablet TAKE 1 TABLET THREE TIMES DAILY 270 tablet 2  . lactulose (CONSTULOSE) 10 GM/15ML solution Take 10 mLs by mouth 2 (two) times daily as needed for constipation.    Marland Kitchen  levothyroxine (SYNTHROID, LEVOTHROID) 200 MCG tablet Take 1 tablet by mouth daily. X 6 days of the week & 337mcg on one day of the week    . losartan (COZAAR) 100 MG tablet Take 1 tablet (100 mg total) by mouth daily. 90 tablet 1  . meclizine (ANTIVERT) 25 MG tablet Take 1 tablet (25 mg total) by mouth every 8 (eight) hours as needed for dizziness. 90 tablet 0  . multivitamin (RENA-VIT) TABS tablet Take 1 tablet by mouth daily.    . prazosin (MINIPRESS) 5 MG capsule Take 1 capsule (5 mg total) by mouth at bedtime. 90 capsule 2  . sevelamer carbonate (RENVELA) 800 MG tablet Take 800 mg by mouth 3 (three) times daily. Does 400mg  twice a day with snacks    . Thiamine HCl (VITAMIN B1 PO) Take 1 tablet by mouth 3 (three) times a week.    . vitamin B-12 (CYANOCOBALAMIN) 500 MCG tablet Take 500 mcg by mouth daily.     No current facility-administered medications for this visit.     Musculoskeletal: Strength & Muscle Tone: within normal limits Gait & Station: normal Patient leans: N/A  Psychiatric Specialty Exam: Review of Systems  Psychiatric/Behavioral: The patient is nervous/anxious.   All other systems reviewed and are negative.   There were no vitals taken for this visit.There is no height or weight on file to calculate BMI.  General Appearance: Casual and Fairly Groomed  Eye Contact:  Good  Speech:  Clear and  Coherent  Volume:  Normal  Mood:  Anxious  Affect:  Appropriate and Congruent  Thought Process:  Goal Directed  Orientation:  Full (Time, Place, and Person)  Thought Content: Rumination   Suicidal Thoughts:  No  Homicidal Thoughts:  No  Memory:  Immediate;   Good Recent;   Good Remote;   Fair  Judgement:  Good  Insight:  Good  Psychomotor Activity:  Normal  Concentration:  Concentration: Good and Attention Span: Good  Recall:  Good  Fund of Knowledge: Good  Language: Good  Akathisia:  No  Handed:  Right  AIMS (if indicated): not done  Assets:  Communication Skills Desire for Improvement Resilience Social Support Talents/Skills  ADL's:  Intact  Cognition: WNL  Sleep:  Good   Screenings:   Assessment and Plan: This patient is a 59 year old female with a history of end-stage renal disease depression and anxiety.  She is not doing quite as well because of the difficulty getting the WellbutrinXL. We will try to get this preauthorized.  She will continue Zoloft 100 mg daily for depression as well as Wellbutrin XL 300 mg daily for depression.  She will continue prazosin 5 mg at bedtime to prevent nightmares and Xanax 1 mg daily as needed for anxiety.  She will return to see me in 3 months  [name], MD 12/14/2019, 9:37 AM

## 2019-12-15 DIAGNOSIS — D509 Iron deficiency anemia, unspecified: Secondary | ICD-10-CM | POA: Diagnosis not present

## 2019-12-15 DIAGNOSIS — N2581 Secondary hyperparathyroidism of renal origin: Secondary | ICD-10-CM | POA: Diagnosis not present

## 2019-12-15 DIAGNOSIS — N186 End stage renal disease: Secondary | ICD-10-CM | POA: Diagnosis not present

## 2019-12-15 DIAGNOSIS — D631 Anemia in chronic kidney disease: Secondary | ICD-10-CM | POA: Diagnosis not present

## 2019-12-16 DIAGNOSIS — N2581 Secondary hyperparathyroidism of renal origin: Secondary | ICD-10-CM | POA: Diagnosis not present

## 2019-12-16 DIAGNOSIS — D631 Anemia in chronic kidney disease: Secondary | ICD-10-CM | POA: Diagnosis not present

## 2019-12-16 DIAGNOSIS — D509 Iron deficiency anemia, unspecified: Secondary | ICD-10-CM | POA: Diagnosis not present

## 2019-12-16 DIAGNOSIS — N186 End stage renal disease: Secondary | ICD-10-CM | POA: Diagnosis not present

## 2019-12-17 DIAGNOSIS — N186 End stage renal disease: Secondary | ICD-10-CM | POA: Diagnosis not present

## 2019-12-17 DIAGNOSIS — Z4932 Encounter for adequacy testing for peritoneal dialysis: Secondary | ICD-10-CM | POA: Diagnosis not present

## 2019-12-17 DIAGNOSIS — N2581 Secondary hyperparathyroidism of renal origin: Secondary | ICD-10-CM | POA: Diagnosis not present

## 2019-12-17 DIAGNOSIS — D509 Iron deficiency anemia, unspecified: Secondary | ICD-10-CM | POA: Diagnosis not present

## 2019-12-17 DIAGNOSIS — Z7682 Awaiting organ transplant status: Secondary | ICD-10-CM | POA: Diagnosis not present

## 2019-12-17 DIAGNOSIS — D631 Anemia in chronic kidney disease: Secondary | ICD-10-CM | POA: Diagnosis not present

## 2019-12-18 DIAGNOSIS — D631 Anemia in chronic kidney disease: Secondary | ICD-10-CM | POA: Diagnosis not present

## 2019-12-18 DIAGNOSIS — D509 Iron deficiency anemia, unspecified: Secondary | ICD-10-CM | POA: Diagnosis not present

## 2019-12-18 DIAGNOSIS — N2581 Secondary hyperparathyroidism of renal origin: Secondary | ICD-10-CM | POA: Diagnosis not present

## 2019-12-18 DIAGNOSIS — N186 End stage renal disease: Secondary | ICD-10-CM | POA: Diagnosis not present

## 2019-12-19 DIAGNOSIS — D509 Iron deficiency anemia, unspecified: Secondary | ICD-10-CM | POA: Diagnosis not present

## 2019-12-19 DIAGNOSIS — D631 Anemia in chronic kidney disease: Secondary | ICD-10-CM | POA: Diagnosis not present

## 2019-12-19 DIAGNOSIS — N186 End stage renal disease: Secondary | ICD-10-CM | POA: Diagnosis not present

## 2019-12-19 DIAGNOSIS — N2581 Secondary hyperparathyroidism of renal origin: Secondary | ICD-10-CM | POA: Diagnosis not present

## 2019-12-20 DIAGNOSIS — N2581 Secondary hyperparathyroidism of renal origin: Secondary | ICD-10-CM | POA: Diagnosis not present

## 2019-12-20 DIAGNOSIS — D509 Iron deficiency anemia, unspecified: Secondary | ICD-10-CM | POA: Diagnosis not present

## 2019-12-20 DIAGNOSIS — N186 End stage renal disease: Secondary | ICD-10-CM | POA: Diagnosis not present

## 2019-12-20 DIAGNOSIS — D631 Anemia in chronic kidney disease: Secondary | ICD-10-CM | POA: Diagnosis not present

## 2019-12-21 DIAGNOSIS — D509 Iron deficiency anemia, unspecified: Secondary | ICD-10-CM | POA: Diagnosis not present

## 2019-12-21 DIAGNOSIS — D631 Anemia in chronic kidney disease: Secondary | ICD-10-CM | POA: Diagnosis not present

## 2019-12-21 DIAGNOSIS — N2581 Secondary hyperparathyroidism of renal origin: Secondary | ICD-10-CM | POA: Diagnosis not present

## 2019-12-21 DIAGNOSIS — N186 End stage renal disease: Secondary | ICD-10-CM | POA: Diagnosis not present

## 2019-12-22 DIAGNOSIS — D631 Anemia in chronic kidney disease: Secondary | ICD-10-CM | POA: Diagnosis not present

## 2019-12-22 DIAGNOSIS — D509 Iron deficiency anemia, unspecified: Secondary | ICD-10-CM | POA: Diagnosis not present

## 2019-12-22 DIAGNOSIS — N186 End stage renal disease: Secondary | ICD-10-CM | POA: Diagnosis not present

## 2019-12-22 DIAGNOSIS — N2581 Secondary hyperparathyroidism of renal origin: Secondary | ICD-10-CM | POA: Diagnosis not present

## 2019-12-23 DIAGNOSIS — D509 Iron deficiency anemia, unspecified: Secondary | ICD-10-CM | POA: Diagnosis not present

## 2019-12-23 DIAGNOSIS — D631 Anemia in chronic kidney disease: Secondary | ICD-10-CM | POA: Diagnosis not present

## 2019-12-23 DIAGNOSIS — N186 End stage renal disease: Secondary | ICD-10-CM | POA: Diagnosis not present

## 2019-12-23 DIAGNOSIS — N2581 Secondary hyperparathyroidism of renal origin: Secondary | ICD-10-CM | POA: Diagnosis not present

## 2019-12-24 DIAGNOSIS — N2581 Secondary hyperparathyroidism of renal origin: Secondary | ICD-10-CM | POA: Diagnosis not present

## 2019-12-24 DIAGNOSIS — N186 End stage renal disease: Secondary | ICD-10-CM | POA: Diagnosis not present

## 2019-12-24 DIAGNOSIS — D509 Iron deficiency anemia, unspecified: Secondary | ICD-10-CM | POA: Diagnosis not present

## 2019-12-24 DIAGNOSIS — D631 Anemia in chronic kidney disease: Secondary | ICD-10-CM | POA: Diagnosis not present

## 2019-12-25 DIAGNOSIS — N2581 Secondary hyperparathyroidism of renal origin: Secondary | ICD-10-CM | POA: Diagnosis not present

## 2019-12-25 DIAGNOSIS — D509 Iron deficiency anemia, unspecified: Secondary | ICD-10-CM | POA: Diagnosis not present

## 2019-12-25 DIAGNOSIS — D631 Anemia in chronic kidney disease: Secondary | ICD-10-CM | POA: Diagnosis not present

## 2019-12-25 DIAGNOSIS — N186 End stage renal disease: Secondary | ICD-10-CM | POA: Diagnosis not present

## 2019-12-26 DIAGNOSIS — N2581 Secondary hyperparathyroidism of renal origin: Secondary | ICD-10-CM | POA: Diagnosis not present

## 2019-12-26 DIAGNOSIS — D631 Anemia in chronic kidney disease: Secondary | ICD-10-CM | POA: Diagnosis not present

## 2019-12-26 DIAGNOSIS — D509 Iron deficiency anemia, unspecified: Secondary | ICD-10-CM | POA: Diagnosis not present

## 2019-12-26 DIAGNOSIS — N186 End stage renal disease: Secondary | ICD-10-CM | POA: Diagnosis not present

## 2019-12-27 DIAGNOSIS — N186 End stage renal disease: Secondary | ICD-10-CM | POA: Diagnosis not present

## 2019-12-27 DIAGNOSIS — N2581 Secondary hyperparathyroidism of renal origin: Secondary | ICD-10-CM | POA: Diagnosis not present

## 2019-12-27 DIAGNOSIS — D631 Anemia in chronic kidney disease: Secondary | ICD-10-CM | POA: Diagnosis not present

## 2019-12-27 DIAGNOSIS — D509 Iron deficiency anemia, unspecified: Secondary | ICD-10-CM | POA: Diagnosis not present

## 2019-12-28 DIAGNOSIS — N2581 Secondary hyperparathyroidism of renal origin: Secondary | ICD-10-CM | POA: Diagnosis not present

## 2019-12-28 DIAGNOSIS — D509 Iron deficiency anemia, unspecified: Secondary | ICD-10-CM | POA: Diagnosis not present

## 2019-12-28 DIAGNOSIS — D631 Anemia in chronic kidney disease: Secondary | ICD-10-CM | POA: Diagnosis not present

## 2019-12-28 DIAGNOSIS — N186 End stage renal disease: Secondary | ICD-10-CM | POA: Diagnosis not present

## 2019-12-29 DIAGNOSIS — D509 Iron deficiency anemia, unspecified: Secondary | ICD-10-CM | POA: Diagnosis not present

## 2019-12-29 DIAGNOSIS — N186 End stage renal disease: Secondary | ICD-10-CM | POA: Diagnosis not present

## 2019-12-29 DIAGNOSIS — N2581 Secondary hyperparathyroidism of renal origin: Secondary | ICD-10-CM | POA: Diagnosis not present

## 2019-12-29 DIAGNOSIS — D631 Anemia in chronic kidney disease: Secondary | ICD-10-CM | POA: Diagnosis not present

## 2019-12-30 DIAGNOSIS — D509 Iron deficiency anemia, unspecified: Secondary | ICD-10-CM | POA: Diagnosis not present

## 2019-12-30 DIAGNOSIS — N186 End stage renal disease: Secondary | ICD-10-CM | POA: Diagnosis not present

## 2019-12-30 DIAGNOSIS — N2581 Secondary hyperparathyroidism of renal origin: Secondary | ICD-10-CM | POA: Diagnosis not present

## 2019-12-30 DIAGNOSIS — D631 Anemia in chronic kidney disease: Secondary | ICD-10-CM | POA: Diagnosis not present

## 2019-12-31 DIAGNOSIS — N186 End stage renal disease: Secondary | ICD-10-CM | POA: Diagnosis not present

## 2019-12-31 DIAGNOSIS — D631 Anemia in chronic kidney disease: Secondary | ICD-10-CM | POA: Diagnosis not present

## 2019-12-31 DIAGNOSIS — Z992 Dependence on renal dialysis: Secondary | ICD-10-CM | POA: Diagnosis not present

## 2019-12-31 DIAGNOSIS — N2581 Secondary hyperparathyroidism of renal origin: Secondary | ICD-10-CM | POA: Diagnosis not present

## 2019-12-31 DIAGNOSIS — D509 Iron deficiency anemia, unspecified: Secondary | ICD-10-CM | POA: Diagnosis not present

## 2020-01-01 DIAGNOSIS — D509 Iron deficiency anemia, unspecified: Secondary | ICD-10-CM | POA: Diagnosis not present

## 2020-01-01 DIAGNOSIS — N2581 Secondary hyperparathyroidism of renal origin: Secondary | ICD-10-CM | POA: Diagnosis not present

## 2020-01-01 DIAGNOSIS — N186 End stage renal disease: Secondary | ICD-10-CM | POA: Diagnosis not present

## 2020-01-01 DIAGNOSIS — D631 Anemia in chronic kidney disease: Secondary | ICD-10-CM | POA: Diagnosis not present

## 2020-01-02 DIAGNOSIS — N2581 Secondary hyperparathyroidism of renal origin: Secondary | ICD-10-CM | POA: Diagnosis not present

## 2020-01-02 DIAGNOSIS — D631 Anemia in chronic kidney disease: Secondary | ICD-10-CM | POA: Diagnosis not present

## 2020-01-02 DIAGNOSIS — D509 Iron deficiency anemia, unspecified: Secondary | ICD-10-CM | POA: Diagnosis not present

## 2020-01-02 DIAGNOSIS — N186 End stage renal disease: Secondary | ICD-10-CM | POA: Diagnosis not present

## 2020-01-03 DIAGNOSIS — D509 Iron deficiency anemia, unspecified: Secondary | ICD-10-CM | POA: Diagnosis not present

## 2020-01-03 DIAGNOSIS — D631 Anemia in chronic kidney disease: Secondary | ICD-10-CM | POA: Diagnosis not present

## 2020-01-03 DIAGNOSIS — N186 End stage renal disease: Secondary | ICD-10-CM | POA: Diagnosis not present

## 2020-01-03 DIAGNOSIS — N2581 Secondary hyperparathyroidism of renal origin: Secondary | ICD-10-CM | POA: Diagnosis not present

## 2020-01-04 DIAGNOSIS — N186 End stage renal disease: Secondary | ICD-10-CM | POA: Diagnosis not present

## 2020-01-04 DIAGNOSIS — D509 Iron deficiency anemia, unspecified: Secondary | ICD-10-CM | POA: Diagnosis not present

## 2020-01-04 DIAGNOSIS — D631 Anemia in chronic kidney disease: Secondary | ICD-10-CM | POA: Diagnosis not present

## 2020-01-04 DIAGNOSIS — N2581 Secondary hyperparathyroidism of renal origin: Secondary | ICD-10-CM | POA: Diagnosis not present

## 2020-01-05 DIAGNOSIS — N2581 Secondary hyperparathyroidism of renal origin: Secondary | ICD-10-CM | POA: Diagnosis not present

## 2020-01-05 DIAGNOSIS — D509 Iron deficiency anemia, unspecified: Secondary | ICD-10-CM | POA: Diagnosis not present

## 2020-01-05 DIAGNOSIS — D631 Anemia in chronic kidney disease: Secondary | ICD-10-CM | POA: Diagnosis not present

## 2020-01-05 DIAGNOSIS — N186 End stage renal disease: Secondary | ICD-10-CM | POA: Diagnosis not present

## 2020-01-06 DIAGNOSIS — N2581 Secondary hyperparathyroidism of renal origin: Secondary | ICD-10-CM | POA: Diagnosis not present

## 2020-01-06 DIAGNOSIS — D631 Anemia in chronic kidney disease: Secondary | ICD-10-CM | POA: Diagnosis not present

## 2020-01-06 DIAGNOSIS — N186 End stage renal disease: Secondary | ICD-10-CM | POA: Diagnosis not present

## 2020-01-06 DIAGNOSIS — D509 Iron deficiency anemia, unspecified: Secondary | ICD-10-CM | POA: Diagnosis not present

## 2020-01-07 DIAGNOSIS — N2581 Secondary hyperparathyroidism of renal origin: Secondary | ICD-10-CM | POA: Diagnosis not present

## 2020-01-07 DIAGNOSIS — D509 Iron deficiency anemia, unspecified: Secondary | ICD-10-CM | POA: Diagnosis not present

## 2020-01-07 DIAGNOSIS — N186 End stage renal disease: Secondary | ICD-10-CM | POA: Diagnosis not present

## 2020-01-07 DIAGNOSIS — D631 Anemia in chronic kidney disease: Secondary | ICD-10-CM | POA: Diagnosis not present

## 2020-01-08 DIAGNOSIS — D509 Iron deficiency anemia, unspecified: Secondary | ICD-10-CM | POA: Diagnosis not present

## 2020-01-08 DIAGNOSIS — N2581 Secondary hyperparathyroidism of renal origin: Secondary | ICD-10-CM | POA: Diagnosis not present

## 2020-01-08 DIAGNOSIS — N186 End stage renal disease: Secondary | ICD-10-CM | POA: Diagnosis not present

## 2020-01-08 DIAGNOSIS — D631 Anemia in chronic kidney disease: Secondary | ICD-10-CM | POA: Diagnosis not present

## 2020-01-09 ENCOUNTER — Other Ambulatory Visit: Payer: Self-pay | Admitting: Orthopedic Surgery

## 2020-01-09 DIAGNOSIS — D631 Anemia in chronic kidney disease: Secondary | ICD-10-CM | POA: Diagnosis not present

## 2020-01-09 DIAGNOSIS — M542 Cervicalgia: Secondary | ICD-10-CM

## 2020-01-09 DIAGNOSIS — D509 Iron deficiency anemia, unspecified: Secondary | ICD-10-CM | POA: Diagnosis not present

## 2020-01-09 DIAGNOSIS — N186 End stage renal disease: Secondary | ICD-10-CM | POA: Diagnosis not present

## 2020-01-09 DIAGNOSIS — N2581 Secondary hyperparathyroidism of renal origin: Secondary | ICD-10-CM | POA: Diagnosis not present

## 2020-01-10 DIAGNOSIS — D631 Anemia in chronic kidney disease: Secondary | ICD-10-CM | POA: Diagnosis not present

## 2020-01-10 DIAGNOSIS — N2581 Secondary hyperparathyroidism of renal origin: Secondary | ICD-10-CM | POA: Diagnosis not present

## 2020-01-10 DIAGNOSIS — N186 End stage renal disease: Secondary | ICD-10-CM | POA: Diagnosis not present

## 2020-01-10 DIAGNOSIS — D509 Iron deficiency anemia, unspecified: Secondary | ICD-10-CM | POA: Diagnosis not present

## 2020-01-11 DIAGNOSIS — D509 Iron deficiency anemia, unspecified: Secondary | ICD-10-CM | POA: Diagnosis not present

## 2020-01-11 DIAGNOSIS — D631 Anemia in chronic kidney disease: Secondary | ICD-10-CM | POA: Diagnosis not present

## 2020-01-11 DIAGNOSIS — N186 End stage renal disease: Secondary | ICD-10-CM | POA: Diagnosis not present

## 2020-01-11 DIAGNOSIS — N2581 Secondary hyperparathyroidism of renal origin: Secondary | ICD-10-CM | POA: Diagnosis not present

## 2020-01-12 DIAGNOSIS — N186 End stage renal disease: Secondary | ICD-10-CM | POA: Diagnosis not present

## 2020-01-12 DIAGNOSIS — N2581 Secondary hyperparathyroidism of renal origin: Secondary | ICD-10-CM | POA: Diagnosis not present

## 2020-01-12 DIAGNOSIS — D509 Iron deficiency anemia, unspecified: Secondary | ICD-10-CM | POA: Diagnosis not present

## 2020-01-12 DIAGNOSIS — D631 Anemia in chronic kidney disease: Secondary | ICD-10-CM | POA: Diagnosis not present

## 2020-01-13 DIAGNOSIS — N186 End stage renal disease: Secondary | ICD-10-CM | POA: Diagnosis not present

## 2020-01-13 DIAGNOSIS — N2581 Secondary hyperparathyroidism of renal origin: Secondary | ICD-10-CM | POA: Diagnosis not present

## 2020-01-13 DIAGNOSIS — D509 Iron deficiency anemia, unspecified: Secondary | ICD-10-CM | POA: Diagnosis not present

## 2020-01-13 DIAGNOSIS — D631 Anemia in chronic kidney disease: Secondary | ICD-10-CM | POA: Diagnosis not present

## 2020-01-14 DIAGNOSIS — D631 Anemia in chronic kidney disease: Secondary | ICD-10-CM | POA: Diagnosis not present

## 2020-01-14 DIAGNOSIS — N186 End stage renal disease: Secondary | ICD-10-CM | POA: Diagnosis not present

## 2020-01-14 DIAGNOSIS — D509 Iron deficiency anemia, unspecified: Secondary | ICD-10-CM | POA: Diagnosis not present

## 2020-01-14 DIAGNOSIS — N2581 Secondary hyperparathyroidism of renal origin: Secondary | ICD-10-CM | POA: Diagnosis not present

## 2020-01-15 DIAGNOSIS — N186 End stage renal disease: Secondary | ICD-10-CM | POA: Diagnosis not present

## 2020-01-15 DIAGNOSIS — N2581 Secondary hyperparathyroidism of renal origin: Secondary | ICD-10-CM | POA: Diagnosis not present

## 2020-01-15 DIAGNOSIS — D631 Anemia in chronic kidney disease: Secondary | ICD-10-CM | POA: Diagnosis not present

## 2020-01-15 DIAGNOSIS — D509 Iron deficiency anemia, unspecified: Secondary | ICD-10-CM | POA: Diagnosis not present

## 2020-01-16 DIAGNOSIS — N186 End stage renal disease: Secondary | ICD-10-CM | POA: Diagnosis not present

## 2020-01-16 DIAGNOSIS — D509 Iron deficiency anemia, unspecified: Secondary | ICD-10-CM | POA: Diagnosis not present

## 2020-01-16 DIAGNOSIS — N2581 Secondary hyperparathyroidism of renal origin: Secondary | ICD-10-CM | POA: Diagnosis not present

## 2020-01-16 DIAGNOSIS — D631 Anemia in chronic kidney disease: Secondary | ICD-10-CM | POA: Diagnosis not present

## 2020-01-17 DIAGNOSIS — D509 Iron deficiency anemia, unspecified: Secondary | ICD-10-CM | POA: Diagnosis not present

## 2020-01-17 DIAGNOSIS — N186 End stage renal disease: Secondary | ICD-10-CM | POA: Diagnosis not present

## 2020-01-17 DIAGNOSIS — D631 Anemia in chronic kidney disease: Secondary | ICD-10-CM | POA: Diagnosis not present

## 2020-01-17 DIAGNOSIS — N2581 Secondary hyperparathyroidism of renal origin: Secondary | ICD-10-CM | POA: Diagnosis not present

## 2020-01-17 DIAGNOSIS — S93402A Sprain of unspecified ligament of left ankle, initial encounter: Secondary | ICD-10-CM | POA: Diagnosis not present

## 2020-01-18 DIAGNOSIS — D509 Iron deficiency anemia, unspecified: Secondary | ICD-10-CM | POA: Diagnosis not present

## 2020-01-18 DIAGNOSIS — N186 End stage renal disease: Secondary | ICD-10-CM | POA: Diagnosis not present

## 2020-01-18 DIAGNOSIS — N2581 Secondary hyperparathyroidism of renal origin: Secondary | ICD-10-CM | POA: Diagnosis not present

## 2020-01-18 DIAGNOSIS — D631 Anemia in chronic kidney disease: Secondary | ICD-10-CM | POA: Diagnosis not present

## 2020-01-19 DIAGNOSIS — N2581 Secondary hyperparathyroidism of renal origin: Secondary | ICD-10-CM | POA: Diagnosis not present

## 2020-01-19 DIAGNOSIS — D631 Anemia in chronic kidney disease: Secondary | ICD-10-CM | POA: Diagnosis not present

## 2020-01-19 DIAGNOSIS — N186 End stage renal disease: Secondary | ICD-10-CM | POA: Diagnosis not present

## 2020-01-19 DIAGNOSIS — D509 Iron deficiency anemia, unspecified: Secondary | ICD-10-CM | POA: Diagnosis not present

## 2020-01-20 DIAGNOSIS — D509 Iron deficiency anemia, unspecified: Secondary | ICD-10-CM | POA: Diagnosis not present

## 2020-01-20 DIAGNOSIS — D631 Anemia in chronic kidney disease: Secondary | ICD-10-CM | POA: Diagnosis not present

## 2020-01-20 DIAGNOSIS — N186 End stage renal disease: Secondary | ICD-10-CM | POA: Diagnosis not present

## 2020-01-20 DIAGNOSIS — N2581 Secondary hyperparathyroidism of renal origin: Secondary | ICD-10-CM | POA: Diagnosis not present

## 2020-01-21 ENCOUNTER — Other Ambulatory Visit (HOSPITAL_COMMUNITY): Payer: Self-pay | Admitting: Family Medicine

## 2020-01-21 ENCOUNTER — Other Ambulatory Visit: Payer: Self-pay

## 2020-01-21 ENCOUNTER — Ambulatory Visit (HOSPITAL_COMMUNITY)
Admission: RE | Admit: 2020-01-21 | Discharge: 2020-01-21 | Disposition: A | Discharge status: Home / Self Care | Payer: Medicare PPO | Source: Ambulatory Visit | Attending: Family Medicine | Admitting: Family Medicine

## 2020-01-21 DIAGNOSIS — D631 Anemia in chronic kidney disease: Secondary | ICD-10-CM | POA: Diagnosis not present

## 2020-01-21 DIAGNOSIS — D509 Iron deficiency anemia, unspecified: Secondary | ICD-10-CM | POA: Diagnosis not present

## 2020-01-21 DIAGNOSIS — Z1231 Encounter for screening mammogram for malignant neoplasm of breast: Secondary | ICD-10-CM | POA: Insufficient documentation

## 2020-01-21 DIAGNOSIS — N2581 Secondary hyperparathyroidism of renal origin: Secondary | ICD-10-CM | POA: Diagnosis not present

## 2020-01-21 DIAGNOSIS — N186 End stage renal disease: Secondary | ICD-10-CM | POA: Diagnosis not present

## 2020-01-22 DIAGNOSIS — D509 Iron deficiency anemia, unspecified: Secondary | ICD-10-CM | POA: Diagnosis not present

## 2020-01-22 DIAGNOSIS — N2581 Secondary hyperparathyroidism of renal origin: Secondary | ICD-10-CM | POA: Diagnosis not present

## 2020-01-22 DIAGNOSIS — D631 Anemia in chronic kidney disease: Secondary | ICD-10-CM | POA: Diagnosis not present

## 2020-01-22 DIAGNOSIS — N186 End stage renal disease: Secondary | ICD-10-CM | POA: Diagnosis not present

## 2020-01-23 DIAGNOSIS — D509 Iron deficiency anemia, unspecified: Secondary | ICD-10-CM | POA: Diagnosis not present

## 2020-01-23 DIAGNOSIS — N2581 Secondary hyperparathyroidism of renal origin: Secondary | ICD-10-CM | POA: Diagnosis not present

## 2020-01-23 DIAGNOSIS — D631 Anemia in chronic kidney disease: Secondary | ICD-10-CM | POA: Diagnosis not present

## 2020-01-23 DIAGNOSIS — N186 End stage renal disease: Secondary | ICD-10-CM | POA: Diagnosis not present

## 2020-01-24 DIAGNOSIS — N2581 Secondary hyperparathyroidism of renal origin: Secondary | ICD-10-CM | POA: Diagnosis not present

## 2020-01-24 DIAGNOSIS — N186 End stage renal disease: Secondary | ICD-10-CM | POA: Diagnosis not present

## 2020-01-24 DIAGNOSIS — D631 Anemia in chronic kidney disease: Secondary | ICD-10-CM | POA: Diagnosis not present

## 2020-01-24 DIAGNOSIS — D509 Iron deficiency anemia, unspecified: Secondary | ICD-10-CM | POA: Diagnosis not present

## 2020-01-25 DIAGNOSIS — N186 End stage renal disease: Secondary | ICD-10-CM | POA: Diagnosis not present

## 2020-01-25 DIAGNOSIS — D509 Iron deficiency anemia, unspecified: Secondary | ICD-10-CM | POA: Diagnosis not present

## 2020-01-25 DIAGNOSIS — D631 Anemia in chronic kidney disease: Secondary | ICD-10-CM | POA: Diagnosis not present

## 2020-01-25 DIAGNOSIS — N2581 Secondary hyperparathyroidism of renal origin: Secondary | ICD-10-CM | POA: Diagnosis not present

## 2020-01-26 DIAGNOSIS — N186 End stage renal disease: Secondary | ICD-10-CM | POA: Diagnosis not present

## 2020-01-26 DIAGNOSIS — N2581 Secondary hyperparathyroidism of renal origin: Secondary | ICD-10-CM | POA: Diagnosis not present

## 2020-01-26 DIAGNOSIS — D509 Iron deficiency anemia, unspecified: Secondary | ICD-10-CM | POA: Diagnosis not present

## 2020-01-26 DIAGNOSIS — D631 Anemia in chronic kidney disease: Secondary | ICD-10-CM | POA: Diagnosis not present

## 2020-01-27 DIAGNOSIS — N2581 Secondary hyperparathyroidism of renal origin: Secondary | ICD-10-CM | POA: Diagnosis not present

## 2020-01-27 DIAGNOSIS — D509 Iron deficiency anemia, unspecified: Secondary | ICD-10-CM | POA: Diagnosis not present

## 2020-01-27 DIAGNOSIS — N186 End stage renal disease: Secondary | ICD-10-CM | POA: Diagnosis not present

## 2020-01-27 DIAGNOSIS — D631 Anemia in chronic kidney disease: Secondary | ICD-10-CM | POA: Diagnosis not present

## 2020-01-28 DIAGNOSIS — N2581 Secondary hyperparathyroidism of renal origin: Secondary | ICD-10-CM | POA: Diagnosis not present

## 2020-01-28 DIAGNOSIS — D509 Iron deficiency anemia, unspecified: Secondary | ICD-10-CM | POA: Diagnosis not present

## 2020-01-28 DIAGNOSIS — D631 Anemia in chronic kidney disease: Secondary | ICD-10-CM | POA: Diagnosis not present

## 2020-01-28 DIAGNOSIS — N186 End stage renal disease: Secondary | ICD-10-CM | POA: Diagnosis not present

## 2020-01-29 DIAGNOSIS — N2581 Secondary hyperparathyroidism of renal origin: Secondary | ICD-10-CM | POA: Diagnosis not present

## 2020-01-29 DIAGNOSIS — D509 Iron deficiency anemia, unspecified: Secondary | ICD-10-CM | POA: Diagnosis not present

## 2020-01-29 DIAGNOSIS — D631 Anemia in chronic kidney disease: Secondary | ICD-10-CM | POA: Diagnosis not present

## 2020-01-29 DIAGNOSIS — N186 End stage renal disease: Secondary | ICD-10-CM | POA: Diagnosis not present

## 2020-01-30 DIAGNOSIS — D631 Anemia in chronic kidney disease: Secondary | ICD-10-CM | POA: Diagnosis not present

## 2020-01-30 DIAGNOSIS — N2581 Secondary hyperparathyroidism of renal origin: Secondary | ICD-10-CM | POA: Diagnosis not present

## 2020-01-30 DIAGNOSIS — N186 End stage renal disease: Secondary | ICD-10-CM | POA: Diagnosis not present

## 2020-01-30 DIAGNOSIS — D509 Iron deficiency anemia, unspecified: Secondary | ICD-10-CM | POA: Diagnosis not present

## 2020-01-31 DIAGNOSIS — N2581 Secondary hyperparathyroidism of renal origin: Secondary | ICD-10-CM | POA: Diagnosis not present

## 2020-01-31 DIAGNOSIS — N186 End stage renal disease: Secondary | ICD-10-CM | POA: Diagnosis not present

## 2020-01-31 DIAGNOSIS — Z992 Dependence on renal dialysis: Secondary | ICD-10-CM | POA: Diagnosis not present

## 2020-01-31 DIAGNOSIS — D631 Anemia in chronic kidney disease: Secondary | ICD-10-CM | POA: Diagnosis not present

## 2020-01-31 DIAGNOSIS — D509 Iron deficiency anemia, unspecified: Secondary | ICD-10-CM | POA: Diagnosis not present

## 2020-02-01 DIAGNOSIS — N2581 Secondary hyperparathyroidism of renal origin: Secondary | ICD-10-CM | POA: Diagnosis not present

## 2020-02-01 DIAGNOSIS — D509 Iron deficiency anemia, unspecified: Secondary | ICD-10-CM | POA: Diagnosis not present

## 2020-02-01 DIAGNOSIS — D631 Anemia in chronic kidney disease: Secondary | ICD-10-CM | POA: Diagnosis not present

## 2020-02-01 DIAGNOSIS — N186 End stage renal disease: Secondary | ICD-10-CM | POA: Diagnosis not present

## 2020-02-02 DIAGNOSIS — D509 Iron deficiency anemia, unspecified: Secondary | ICD-10-CM | POA: Diagnosis not present

## 2020-02-02 DIAGNOSIS — D631 Anemia in chronic kidney disease: Secondary | ICD-10-CM | POA: Diagnosis not present

## 2020-02-02 DIAGNOSIS — N2581 Secondary hyperparathyroidism of renal origin: Secondary | ICD-10-CM | POA: Diagnosis not present

## 2020-02-02 DIAGNOSIS — N186 End stage renal disease: Secondary | ICD-10-CM | POA: Diagnosis not present

## 2020-02-03 DIAGNOSIS — D509 Iron deficiency anemia, unspecified: Secondary | ICD-10-CM | POA: Diagnosis not present

## 2020-02-03 DIAGNOSIS — N186 End stage renal disease: Secondary | ICD-10-CM | POA: Diagnosis not present

## 2020-02-03 DIAGNOSIS — D631 Anemia in chronic kidney disease: Secondary | ICD-10-CM | POA: Diagnosis not present

## 2020-02-03 DIAGNOSIS — N2581 Secondary hyperparathyroidism of renal origin: Secondary | ICD-10-CM | POA: Diagnosis not present

## 2020-02-04 DIAGNOSIS — N186 End stage renal disease: Secondary | ICD-10-CM | POA: Diagnosis not present

## 2020-02-04 DIAGNOSIS — D509 Iron deficiency anemia, unspecified: Secondary | ICD-10-CM | POA: Diagnosis not present

## 2020-02-04 DIAGNOSIS — N2581 Secondary hyperparathyroidism of renal origin: Secondary | ICD-10-CM | POA: Diagnosis not present

## 2020-02-04 DIAGNOSIS — D631 Anemia in chronic kidney disease: Secondary | ICD-10-CM | POA: Diagnosis not present

## 2020-02-06 DIAGNOSIS — N2581 Secondary hyperparathyroidism of renal origin: Secondary | ICD-10-CM | POA: Diagnosis not present

## 2020-02-06 DIAGNOSIS — D631 Anemia in chronic kidney disease: Secondary | ICD-10-CM | POA: Diagnosis not present

## 2020-02-06 DIAGNOSIS — D509 Iron deficiency anemia, unspecified: Secondary | ICD-10-CM | POA: Diagnosis not present

## 2020-02-06 DIAGNOSIS — N186 End stage renal disease: Secondary | ICD-10-CM | POA: Diagnosis not present

## 2020-02-07 DIAGNOSIS — N186 End stage renal disease: Secondary | ICD-10-CM | POA: Diagnosis not present

## 2020-02-07 DIAGNOSIS — D631 Anemia in chronic kidney disease: Secondary | ICD-10-CM | POA: Diagnosis not present

## 2020-02-07 DIAGNOSIS — D509 Iron deficiency anemia, unspecified: Secondary | ICD-10-CM | POA: Diagnosis not present

## 2020-02-07 DIAGNOSIS — N2581 Secondary hyperparathyroidism of renal origin: Secondary | ICD-10-CM | POA: Diagnosis not present

## 2020-02-08 DIAGNOSIS — N186 End stage renal disease: Secondary | ICD-10-CM | POA: Diagnosis not present

## 2020-02-08 DIAGNOSIS — N2581 Secondary hyperparathyroidism of renal origin: Secondary | ICD-10-CM | POA: Diagnosis not present

## 2020-02-08 DIAGNOSIS — D631 Anemia in chronic kidney disease: Secondary | ICD-10-CM | POA: Diagnosis not present

## 2020-02-08 DIAGNOSIS — D509 Iron deficiency anemia, unspecified: Secondary | ICD-10-CM | POA: Diagnosis not present

## 2020-02-09 DIAGNOSIS — N186 End stage renal disease: Secondary | ICD-10-CM | POA: Diagnosis not present

## 2020-02-09 DIAGNOSIS — D509 Iron deficiency anemia, unspecified: Secondary | ICD-10-CM | POA: Diagnosis not present

## 2020-02-09 DIAGNOSIS — N2581 Secondary hyperparathyroidism of renal origin: Secondary | ICD-10-CM | POA: Diagnosis not present

## 2020-02-09 DIAGNOSIS — D631 Anemia in chronic kidney disease: Secondary | ICD-10-CM | POA: Diagnosis not present

## 2020-02-10 DIAGNOSIS — D631 Anemia in chronic kidney disease: Secondary | ICD-10-CM | POA: Diagnosis not present

## 2020-02-10 DIAGNOSIS — N2581 Secondary hyperparathyroidism of renal origin: Secondary | ICD-10-CM | POA: Diagnosis not present

## 2020-02-10 DIAGNOSIS — D509 Iron deficiency anemia, unspecified: Secondary | ICD-10-CM | POA: Diagnosis not present

## 2020-02-10 DIAGNOSIS — N186 End stage renal disease: Secondary | ICD-10-CM | POA: Diagnosis not present

## 2020-02-11 ENCOUNTER — Telehealth (HOSPITAL_COMMUNITY): Payer: Self-pay | Admitting: Psychiatry

## 2020-02-11 NOTE — Telephone Encounter (Signed)
Called to schedule f/u appt left vm 

## 2020-02-12 DIAGNOSIS — D509 Iron deficiency anemia, unspecified: Secondary | ICD-10-CM | POA: Diagnosis not present

## 2020-02-12 DIAGNOSIS — N2581 Secondary hyperparathyroidism of renal origin: Secondary | ICD-10-CM | POA: Diagnosis not present

## 2020-02-12 DIAGNOSIS — D631 Anemia in chronic kidney disease: Secondary | ICD-10-CM | POA: Diagnosis not present

## 2020-02-12 DIAGNOSIS — N186 End stage renal disease: Secondary | ICD-10-CM | POA: Diagnosis not present

## 2020-02-13 DIAGNOSIS — N2581 Secondary hyperparathyroidism of renal origin: Secondary | ICD-10-CM | POA: Diagnosis not present

## 2020-02-13 DIAGNOSIS — D509 Iron deficiency anemia, unspecified: Secondary | ICD-10-CM | POA: Diagnosis not present

## 2020-02-13 DIAGNOSIS — D631 Anemia in chronic kidney disease: Secondary | ICD-10-CM | POA: Diagnosis not present

## 2020-02-13 DIAGNOSIS — N186 End stage renal disease: Secondary | ICD-10-CM | POA: Diagnosis not present

## 2020-02-14 DIAGNOSIS — N186 End stage renal disease: Secondary | ICD-10-CM | POA: Diagnosis not present

## 2020-02-14 DIAGNOSIS — D509 Iron deficiency anemia, unspecified: Secondary | ICD-10-CM | POA: Diagnosis not present

## 2020-02-14 DIAGNOSIS — N2581 Secondary hyperparathyroidism of renal origin: Secondary | ICD-10-CM | POA: Diagnosis not present

## 2020-02-14 DIAGNOSIS — D631 Anemia in chronic kidney disease: Secondary | ICD-10-CM | POA: Diagnosis not present

## 2020-02-15 DIAGNOSIS — N2581 Secondary hyperparathyroidism of renal origin: Secondary | ICD-10-CM | POA: Diagnosis not present

## 2020-02-15 DIAGNOSIS — D509 Iron deficiency anemia, unspecified: Secondary | ICD-10-CM | POA: Diagnosis not present

## 2020-02-15 DIAGNOSIS — D631 Anemia in chronic kidney disease: Secondary | ICD-10-CM | POA: Diagnosis not present

## 2020-02-15 DIAGNOSIS — N186 End stage renal disease: Secondary | ICD-10-CM | POA: Diagnosis not present

## 2020-02-16 DIAGNOSIS — N2581 Secondary hyperparathyroidism of renal origin: Secondary | ICD-10-CM | POA: Diagnosis not present

## 2020-02-16 DIAGNOSIS — D631 Anemia in chronic kidney disease: Secondary | ICD-10-CM | POA: Diagnosis not present

## 2020-02-16 DIAGNOSIS — N186 End stage renal disease: Secondary | ICD-10-CM | POA: Diagnosis not present

## 2020-02-16 DIAGNOSIS — D509 Iron deficiency anemia, unspecified: Secondary | ICD-10-CM | POA: Diagnosis not present

## 2020-02-17 DIAGNOSIS — D631 Anemia in chronic kidney disease: Secondary | ICD-10-CM | POA: Diagnosis not present

## 2020-02-17 DIAGNOSIS — D509 Iron deficiency anemia, unspecified: Secondary | ICD-10-CM | POA: Diagnosis not present

## 2020-02-17 DIAGNOSIS — N186 End stage renal disease: Secondary | ICD-10-CM | POA: Diagnosis not present

## 2020-02-17 DIAGNOSIS — N2581 Secondary hyperparathyroidism of renal origin: Secondary | ICD-10-CM | POA: Diagnosis not present

## 2020-02-18 DIAGNOSIS — D631 Anemia in chronic kidney disease: Secondary | ICD-10-CM | POA: Diagnosis not present

## 2020-02-18 DIAGNOSIS — N186 End stage renal disease: Secondary | ICD-10-CM | POA: Diagnosis not present

## 2020-02-18 DIAGNOSIS — D509 Iron deficiency anemia, unspecified: Secondary | ICD-10-CM | POA: Diagnosis not present

## 2020-02-18 DIAGNOSIS — N2581 Secondary hyperparathyroidism of renal origin: Secondary | ICD-10-CM | POA: Diagnosis not present

## 2020-02-19 DIAGNOSIS — N2581 Secondary hyperparathyroidism of renal origin: Secondary | ICD-10-CM | POA: Diagnosis not present

## 2020-02-19 DIAGNOSIS — D509 Iron deficiency anemia, unspecified: Secondary | ICD-10-CM | POA: Diagnosis not present

## 2020-02-19 DIAGNOSIS — N186 End stage renal disease: Secondary | ICD-10-CM | POA: Diagnosis not present

## 2020-02-19 DIAGNOSIS — D631 Anemia in chronic kidney disease: Secondary | ICD-10-CM | POA: Diagnosis not present

## 2020-02-20 DIAGNOSIS — D509 Iron deficiency anemia, unspecified: Secondary | ICD-10-CM | POA: Diagnosis not present

## 2020-02-20 DIAGNOSIS — N186 End stage renal disease: Secondary | ICD-10-CM | POA: Diagnosis not present

## 2020-02-20 DIAGNOSIS — D631 Anemia in chronic kidney disease: Secondary | ICD-10-CM | POA: Diagnosis not present

## 2020-02-20 DIAGNOSIS — N2581 Secondary hyperparathyroidism of renal origin: Secondary | ICD-10-CM | POA: Diagnosis not present

## 2020-02-21 DIAGNOSIS — D631 Anemia in chronic kidney disease: Secondary | ICD-10-CM | POA: Diagnosis not present

## 2020-02-21 DIAGNOSIS — D509 Iron deficiency anemia, unspecified: Secondary | ICD-10-CM | POA: Diagnosis not present

## 2020-02-21 DIAGNOSIS — N2581 Secondary hyperparathyroidism of renal origin: Secondary | ICD-10-CM | POA: Diagnosis not present

## 2020-02-21 DIAGNOSIS — N186 End stage renal disease: Secondary | ICD-10-CM | POA: Diagnosis not present

## 2020-02-22 DIAGNOSIS — N2581 Secondary hyperparathyroidism of renal origin: Secondary | ICD-10-CM | POA: Diagnosis not present

## 2020-02-22 DIAGNOSIS — D631 Anemia in chronic kidney disease: Secondary | ICD-10-CM | POA: Diagnosis not present

## 2020-02-22 DIAGNOSIS — D509 Iron deficiency anemia, unspecified: Secondary | ICD-10-CM | POA: Diagnosis not present

## 2020-02-22 DIAGNOSIS — N186 End stage renal disease: Secondary | ICD-10-CM | POA: Diagnosis not present

## 2020-02-23 DIAGNOSIS — N186 End stage renal disease: Secondary | ICD-10-CM | POA: Diagnosis not present

## 2020-02-23 DIAGNOSIS — N2581 Secondary hyperparathyroidism of renal origin: Secondary | ICD-10-CM | POA: Diagnosis not present

## 2020-02-23 DIAGNOSIS — D631 Anemia in chronic kidney disease: Secondary | ICD-10-CM | POA: Diagnosis not present

## 2020-02-23 DIAGNOSIS — D509 Iron deficiency anemia, unspecified: Secondary | ICD-10-CM | POA: Diagnosis not present

## 2020-02-24 ENCOUNTER — Telehealth: Payer: Self-pay | Admitting: Cardiology

## 2020-02-24 DIAGNOSIS — D631 Anemia in chronic kidney disease: Secondary | ICD-10-CM | POA: Diagnosis not present

## 2020-02-24 DIAGNOSIS — D509 Iron deficiency anemia, unspecified: Secondary | ICD-10-CM | POA: Diagnosis not present

## 2020-02-24 DIAGNOSIS — N2581 Secondary hyperparathyroidism of renal origin: Secondary | ICD-10-CM | POA: Diagnosis not present

## 2020-02-24 DIAGNOSIS — N186 End stage renal disease: Secondary | ICD-10-CM | POA: Diagnosis not present

## 2020-02-24 DIAGNOSIS — L2084 Intrinsic (allergic) eczema: Secondary | ICD-10-CM | POA: Diagnosis not present

## 2020-02-24 DIAGNOSIS — Z5181 Encounter for therapeutic drug level monitoring: Secondary | ICD-10-CM | POA: Diagnosis not present

## 2020-02-24 DIAGNOSIS — Z79899 Other long term (current) drug therapy: Secondary | ICD-10-CM | POA: Diagnosis not present

## 2020-02-24 MED ORDER — LABETALOL HCL 200 MG PO TABS: 200.0000 mg | ORAL_TABLET | Freq: Two times a day (BID) | ORAL | 2 refills | Status: DC

## 2020-02-24 NOTE — Telephone Encounter (Signed)
Can she start labetalol 200mg  bid, update [place] on bp's next week   Zandra Abts MD

## 2020-02-24 NOTE — Telephone Encounter (Signed)
Patient informed and verbalized understanding of plan. 

## 2020-02-24 NOTE — Telephone Encounter (Signed)
Reports BP has been elevated the past week. Please see BP readings below. Reports dizziness one day but is no longer dizzy. Denies chest pain or SOB. Reports that she feels fine but wanted to make Dr. Harl Bowie aware of her BP since he manages her BP medications. Reports that she saw her nephrologist (Dr. Kris Mouton w/[facility] on Monday) and discussed elevated BP but no changes were made. Heart rate has been normal. Reports checking her BP in the mornings after medications using same arm and same cuff. Reports performing peritoneal dialysis in the evenings. Medication reviewed. Advised to continue monitoring BP and message would be sent to Dr. Harl Bowie who will be back in the office next week. Advised that if she develops dizziness, headache, blurred vision, numbness or tingling, to report to the ED immediately or all 911. Verbalized understanding of plan.

## 2020-02-24 NOTE — Telephone Encounter (Signed)
New message     Pt c/o BP issue: STAT if pt c/o blurred vision, one-sided weakness or slurred speech  1. What are your last 5 BP readings?   190/78 -140/70 hr in 70's   2. Are you having any other symptoms (ex. Dizziness, headache, blurred vision, passed out)?  Has had some dizziness   3. What is your BP issue? Top number has been running high    Thursday  150/74  Friday 160/78 Saturday 155/80 Sunday 160/75 Monday 180/78  190/? Tuesday 160/74 Today 150/78

## 2020-02-25 DIAGNOSIS — D509 Iron deficiency anemia, unspecified: Secondary | ICD-10-CM | POA: Diagnosis not present

## 2020-02-25 DIAGNOSIS — N186 End stage renal disease: Secondary | ICD-10-CM | POA: Diagnosis not present

## 2020-02-25 DIAGNOSIS — D631 Anemia in chronic kidney disease: Secondary | ICD-10-CM | POA: Diagnosis not present

## 2020-02-25 DIAGNOSIS — N2581 Secondary hyperparathyroidism of renal origin: Secondary | ICD-10-CM | POA: Diagnosis not present

## 2020-02-26 DIAGNOSIS — N2581 Secondary hyperparathyroidism of renal origin: Secondary | ICD-10-CM | POA: Diagnosis not present

## 2020-02-26 DIAGNOSIS — N186 End stage renal disease: Secondary | ICD-10-CM | POA: Diagnosis not present

## 2020-02-26 DIAGNOSIS — D509 Iron deficiency anemia, unspecified: Secondary | ICD-10-CM | POA: Diagnosis not present

## 2020-02-26 DIAGNOSIS — D631 Anemia in chronic kidney disease: Secondary | ICD-10-CM | POA: Diagnosis not present

## 2020-02-27 DIAGNOSIS — N186 End stage renal disease: Secondary | ICD-10-CM | POA: Diagnosis not present

## 2020-02-27 DIAGNOSIS — D509 Iron deficiency anemia, unspecified: Secondary | ICD-10-CM | POA: Diagnosis not present

## 2020-02-27 DIAGNOSIS — N2581 Secondary hyperparathyroidism of renal origin: Secondary | ICD-10-CM | POA: Diagnosis not present

## 2020-02-27 DIAGNOSIS — D631 Anemia in chronic kidney disease: Secondary | ICD-10-CM | POA: Diagnosis not present

## 2020-02-28 DIAGNOSIS — N2581 Secondary hyperparathyroidism of renal origin: Secondary | ICD-10-CM | POA: Diagnosis not present

## 2020-02-28 DIAGNOSIS — D509 Iron deficiency anemia, unspecified: Secondary | ICD-10-CM | POA: Diagnosis not present

## 2020-02-28 DIAGNOSIS — N186 End stage renal disease: Secondary | ICD-10-CM | POA: Diagnosis not present

## 2020-02-28 DIAGNOSIS — D631 Anemia in chronic kidney disease: Secondary | ICD-10-CM | POA: Diagnosis not present

## 2020-02-29 DIAGNOSIS — N2581 Secondary hyperparathyroidism of renal origin: Secondary | ICD-10-CM | POA: Diagnosis not present

## 2020-02-29 DIAGNOSIS — N186 End stage renal disease: Secondary | ICD-10-CM | POA: Diagnosis not present

## 2020-02-29 DIAGNOSIS — D509 Iron deficiency anemia, unspecified: Secondary | ICD-10-CM | POA: Diagnosis not present

## 2020-02-29 DIAGNOSIS — D631 Anemia in chronic kidney disease: Secondary | ICD-10-CM | POA: Diagnosis not present

## 2020-03-01 DIAGNOSIS — D631 Anemia in chronic kidney disease: Secondary | ICD-10-CM | POA: Diagnosis not present

## 2020-03-01 DIAGNOSIS — F411 Generalized anxiety disorder: Secondary | ICD-10-CM | POA: Diagnosis not present

## 2020-03-01 DIAGNOSIS — Z992 Dependence on renal dialysis: Secondary | ICD-10-CM | POA: Diagnosis not present

## 2020-03-01 DIAGNOSIS — M189 Osteoarthritis of first carpometacarpal joint, unspecified: Secondary | ICD-10-CM | POA: Diagnosis not present

## 2020-03-01 DIAGNOSIS — N186 End stage renal disease: Secondary | ICD-10-CM | POA: Diagnosis not present

## 2020-03-01 DIAGNOSIS — N2581 Secondary hyperparathyroidism of renal origin: Secondary | ICD-10-CM | POA: Diagnosis not present

## 2020-03-01 DIAGNOSIS — E1122 Type 2 diabetes mellitus with diabetic chronic kidney disease: Secondary | ICD-10-CM | POA: Diagnosis not present

## 2020-03-01 DIAGNOSIS — Z0000 Encounter for general adult medical examination without abnormal findings: Secondary | ICD-10-CM | POA: Diagnosis not present

## 2020-03-01 DIAGNOSIS — D509 Iron deficiency anemia, unspecified: Secondary | ICD-10-CM | POA: Diagnosis not present

## 2020-03-02 DIAGNOSIS — N2581 Secondary hyperparathyroidism of renal origin: Secondary | ICD-10-CM | POA: Diagnosis not present

## 2020-03-02 DIAGNOSIS — D631 Anemia in chronic kidney disease: Secondary | ICD-10-CM | POA: Diagnosis not present

## 2020-03-02 DIAGNOSIS — N186 End stage renal disease: Secondary | ICD-10-CM | POA: Diagnosis not present

## 2020-03-03 DIAGNOSIS — D631 Anemia in chronic kidney disease: Secondary | ICD-10-CM | POA: Diagnosis not present

## 2020-03-03 DIAGNOSIS — N2581 Secondary hyperparathyroidism of renal origin: Secondary | ICD-10-CM | POA: Diagnosis not present

## 2020-03-03 DIAGNOSIS — N186 End stage renal disease: Secondary | ICD-10-CM | POA: Diagnosis not present

## 2020-03-04 ENCOUNTER — Encounter: Payer: Self-pay | Admitting: Podiatry

## 2020-03-04 ENCOUNTER — Other Ambulatory Visit: Payer: Self-pay

## 2020-03-04 ENCOUNTER — Ambulatory Visit (INDEPENDENT_AMBULATORY_CARE_PROVIDER_SITE_OTHER): Payer: Medicare PPO

## 2020-03-04 ENCOUNTER — Ambulatory Visit (INDEPENDENT_AMBULATORY_CARE_PROVIDER_SITE_OTHER): Payer: Medicare PPO | Admitting: Podiatry

## 2020-03-04 DIAGNOSIS — M76822 Posterior tibial tendinitis, left leg: Secondary | ICD-10-CM

## 2020-03-04 DIAGNOSIS — S9002XA Contusion of left ankle, initial encounter: Secondary | ICD-10-CM

## 2020-03-04 DIAGNOSIS — N186 End stage renal disease: Secondary | ICD-10-CM | POA: Diagnosis not present

## 2020-03-04 DIAGNOSIS — N2581 Secondary hyperparathyroidism of renal origin: Secondary | ICD-10-CM | POA: Diagnosis not present

## 2020-03-04 DIAGNOSIS — L600 Ingrowing nail: Secondary | ICD-10-CM

## 2020-03-04 DIAGNOSIS — D631 Anemia in chronic kidney disease: Secondary | ICD-10-CM | POA: Diagnosis not present

## 2020-03-05 DIAGNOSIS — H6121 Impacted cerumen, right ear: Secondary | ICD-10-CM | POA: Diagnosis not present

## 2020-03-05 DIAGNOSIS — H65191 Other acute nonsuppurative otitis media, right ear: Secondary | ICD-10-CM | POA: Diagnosis not present

## 2020-03-05 DIAGNOSIS — N186 End stage renal disease: Secondary | ICD-10-CM | POA: Diagnosis not present

## 2020-03-05 DIAGNOSIS — D631 Anemia in chronic kidney disease: Secondary | ICD-10-CM | POA: Diagnosis not present

## 2020-03-05 DIAGNOSIS — N2581 Secondary hyperparathyroidism of renal origin: Secondary | ICD-10-CM | POA: Diagnosis not present

## 2020-03-06 DIAGNOSIS — N2581 Secondary hyperparathyroidism of renal origin: Secondary | ICD-10-CM | POA: Diagnosis not present

## 2020-03-06 DIAGNOSIS — M76822 Posterior tibial tendinitis, left leg: Secondary | ICD-10-CM | POA: Diagnosis not present

## 2020-03-06 DIAGNOSIS — N186 End stage renal disease: Secondary | ICD-10-CM | POA: Diagnosis not present

## 2020-03-06 DIAGNOSIS — D631 Anemia in chronic kidney disease: Secondary | ICD-10-CM | POA: Diagnosis not present

## 2020-03-06 DIAGNOSIS — S9002XA Contusion of left ankle, initial encounter: Secondary | ICD-10-CM | POA: Diagnosis not present

## 2020-03-06 MED ORDER — TRIAMCINOLONE ACETONIDE 10 MG/ML IJ SUSP
10.0000 mg | Freq: Once | INTRAMUSCULAR | Status: AC
  Administered 2020-03-06: 10 mg

## 2020-03-06 NOTE — Progress Notes (Signed)
Subjective:   Patient ID: April Ward, female   DOB: 59 y.o.   MRN: [id number]   HPI Patient presents stating she has developed intense discomfort in the left medial ankle and states it is been sore making it hard to walk. Does not remember specific injury but states it is become quite inflamed   ROS      Objective:  Physical Exam  Neurovascular status intact with moderate depression of the left arch with inflammation fluid around the medial ankle with pain     Assessment:  Inflammatory tendinitis posterior tibial left with no indications currently of tendon rupture     Plan:  H&P condition explained to patient and I did careful sterile injection 3 mg Kenalog 5 mg Xylocaine and I applied air fracture walker to completely immobilize along with ice therapy and discussed long-term orthotics reappoint 3 weeks Patient x-ray shows depression of the arch and does not show signs of fracture or other bone pathology

## 2020-03-07 DIAGNOSIS — N186 End stage renal disease: Secondary | ICD-10-CM | POA: Diagnosis not present

## 2020-03-07 DIAGNOSIS — N2581 Secondary hyperparathyroidism of renal origin: Secondary | ICD-10-CM | POA: Diagnosis not present

## 2020-03-07 DIAGNOSIS — D631 Anemia in chronic kidney disease: Secondary | ICD-10-CM | POA: Diagnosis not present

## 2020-03-08 DIAGNOSIS — D631 Anemia in chronic kidney disease: Secondary | ICD-10-CM | POA: Diagnosis not present

## 2020-03-08 DIAGNOSIS — N2581 Secondary hyperparathyroidism of renal origin: Secondary | ICD-10-CM | POA: Diagnosis not present

## 2020-03-08 DIAGNOSIS — N186 End stage renal disease: Secondary | ICD-10-CM | POA: Diagnosis not present

## 2020-03-09 DIAGNOSIS — D631 Anemia in chronic kidney disease: Secondary | ICD-10-CM | POA: Diagnosis not present

## 2020-03-09 DIAGNOSIS — N186 End stage renal disease: Secondary | ICD-10-CM | POA: Diagnosis not present

## 2020-03-09 DIAGNOSIS — N2581 Secondary hyperparathyroidism of renal origin: Secondary | ICD-10-CM | POA: Diagnosis not present

## 2020-03-10 DIAGNOSIS — D631 Anemia in chronic kidney disease: Secondary | ICD-10-CM | POA: Diagnosis not present

## 2020-03-10 DIAGNOSIS — R591 Generalized enlarged lymph nodes: Secondary | ICD-10-CM | POA: Diagnosis not present

## 2020-03-10 DIAGNOSIS — R600 Localized edema: Secondary | ICD-10-CM | POA: Diagnosis not present

## 2020-03-10 DIAGNOSIS — M4312 Spondylolisthesis, cervical region: Secondary | ICD-10-CM | POA: Diagnosis not present

## 2020-03-10 DIAGNOSIS — N189 Chronic kidney disease, unspecified: Secondary | ICD-10-CM | POA: Diagnosis not present

## 2020-03-10 DIAGNOSIS — M50321 Other cervical disc degeneration at C4-C5 level: Secondary | ICD-10-CM | POA: Diagnosis not present

## 2020-03-10 DIAGNOSIS — N186 End stage renal disease: Secondary | ICD-10-CM | POA: Diagnosis not present

## 2020-03-10 DIAGNOSIS — M5030 Other cervical disc degeneration, unspecified cervical region: Secondary | ICD-10-CM | POA: Diagnosis not present

## 2020-03-10 DIAGNOSIS — D649 Anemia, unspecified: Secondary | ICD-10-CM | POA: Diagnosis not present

## 2020-03-10 DIAGNOSIS — M7122 Synovial cyst of popliteal space [Baker], left knee: Secondary | ICD-10-CM | POA: Diagnosis not present

## 2020-03-10 DIAGNOSIS — N2581 Secondary hyperparathyroidism of renal origin: Secondary | ICD-10-CM | POA: Diagnosis not present

## 2020-03-11 DIAGNOSIS — D631 Anemia in chronic kidney disease: Secondary | ICD-10-CM | POA: Diagnosis not present

## 2020-03-11 DIAGNOSIS — N186 End stage renal disease: Secondary | ICD-10-CM | POA: Diagnosis not present

## 2020-03-11 DIAGNOSIS — M79675 Pain in left toe(s): Secondary | ICD-10-CM | POA: Diagnosis not present

## 2020-03-11 DIAGNOSIS — N2581 Secondary hyperparathyroidism of renal origin: Secondary | ICD-10-CM | POA: Diagnosis not present

## 2020-03-11 DIAGNOSIS — M79672 Pain in left foot: Secondary | ICD-10-CM | POA: Diagnosis not present

## 2020-03-12 DIAGNOSIS — N186 End stage renal disease: Secondary | ICD-10-CM | POA: Diagnosis not present

## 2020-03-12 DIAGNOSIS — N2581 Secondary hyperparathyroidism of renal origin: Secondary | ICD-10-CM | POA: Diagnosis not present

## 2020-03-12 DIAGNOSIS — D631 Anemia in chronic kidney disease: Secondary | ICD-10-CM | POA: Diagnosis not present

## 2020-03-13 DIAGNOSIS — D631 Anemia in chronic kidney disease: Secondary | ICD-10-CM | POA: Diagnosis not present

## 2020-03-13 DIAGNOSIS — N2581 Secondary hyperparathyroidism of renal origin: Secondary | ICD-10-CM | POA: Diagnosis not present

## 2020-03-13 DIAGNOSIS — N186 End stage renal disease: Secondary | ICD-10-CM | POA: Diagnosis not present

## 2020-03-14 ENCOUNTER — Other Ambulatory Visit: Payer: Self-pay

## 2020-03-14 ENCOUNTER — Encounter (HOSPITAL_COMMUNITY): Payer: Self-pay | Admitting: Psychiatry

## 2020-03-14 ENCOUNTER — Telehealth (INDEPENDENT_AMBULATORY_CARE_PROVIDER_SITE_OTHER): Payer: Medicare PPO | Admitting: Psychiatry

## 2020-03-14 DIAGNOSIS — Z4932 Encounter for adequacy testing for peritoneal dialysis: Secondary | ICD-10-CM | POA: Diagnosis not present

## 2020-03-14 DIAGNOSIS — Z7682 Awaiting organ transplant status: Secondary | ICD-10-CM | POA: Diagnosis not present

## 2020-03-14 DIAGNOSIS — F411 Generalized anxiety disorder: Secondary | ICD-10-CM | POA: Diagnosis not present

## 2020-03-14 DIAGNOSIS — F322 Major depressive disorder, single episode, severe without psychotic features: Secondary | ICD-10-CM

## 2020-03-14 DIAGNOSIS — N186 End stage renal disease: Secondary | ICD-10-CM | POA: Diagnosis not present

## 2020-03-14 DIAGNOSIS — N2581 Secondary hyperparathyroidism of renal origin: Secondary | ICD-10-CM | POA: Diagnosis not present

## 2020-03-14 DIAGNOSIS — D631 Anemia in chronic kidney disease: Secondary | ICD-10-CM | POA: Diagnosis not present

## 2020-03-14 MED ORDER — ALPRAZOLAM 1 MG PO TABS: 1.0000 mg | ORAL_TABLET | Freq: Every day | ORAL | 2 refills | Status: DC | PRN

## 2020-03-14 MED ORDER — BUPROPION HCL ER (XL) 300 MG PO TB24: 300.0000 mg | ORAL_TABLET | ORAL | 2 refills | Status: DC

## 2020-03-14 MED ORDER — PRAZOSIN HCL 5 MG PO CAPS: 5.0000 mg | ORAL_CAPSULE | Freq: Every day | ORAL | 2 refills | Status: DC

## 2020-03-14 MED ORDER — SERTRALINE HCL 100 MG PO TABS: 100.0000 mg | ORAL_TABLET | Freq: Every day | ORAL | 2 refills | Status: DC

## 2020-03-14 NOTE — Progress Notes (Signed)
Virtual Visit via Video Note  I connected with April Ward on 03/14/20 at  8:40 AM EST by a video enabled telemedicine application and verified that I am speaking with the correct person using two identifiers.  Location: Patient: home Provider: office   I discussed the limitations of evaluation and management by telemedicine and the availability of in person appointments. The patient expressed understanding and agreed to proceed    I discussed the assessment and treatment plan with the patient. The patient was provided an opportunity to ask questions and all were answered. The patient agreed with the plan and demonstrated an understanding of the instructions.   The patient was advised to call back or seek an in-person evaluation if the symptoms worsen or if the condition fails to improve as anticipated.  I provided 15 minutes of non-face-to-face time during this encounter.   [name], MD  [facility] MD/PA/NP OP Progress Note  03/14/2020 8:56 AM April Ward  MRN:  [id number]  Chief Complaint: depression, anxiety HPI: This patient is a 59 year old widowed white female lives with her son in [place].  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  The patient returns for follow-up after 3 months.  She states that she has had some down times.  Last time she stated that her insurance would not pay for Wellbutrin.  I sent it back in again to hold we would be able to get it authorized but we never heard anything and she still has not gotten it.  She remains on Zoloft but without the Wellbutrin she feels more depressed and sad.  Her energy is a bit low.  She is still awaiting a renal transplant.  She is trying to exercise by walking outside when she can.  Her renal physician does not want her to be working out in the gym around other people because of the danger of coronavirus.  She has gotten her vaccine and booster shots.  The patient denies suicidal ideation and keeps  trying to do her normal routine.  She does think the Xanax continues to help with her anxiety and she is sleeping fairly well.  Visit Diagnosis:    ICD-10-CM   1. Major depressive disorder, single episode, severe without psychotic features ([place])  F32.2   2. GAD (generalized anxiety disorder)  F41.1     Past Psychiatric History: Long-term outpatient treatment  Past Medical History:  Past Medical History:  Diagnosis Date  . Anxiety   . Arthritis   . Depression   . Diabetes mellitus without complication ([place])   . GERD (gastroesophageal reflux disease)   . Gout   . Heart murmur   . Hypertension   . Renal cancer ([place])   . Renal disease 10/2012  . Renal insufficiency     Past Surgical History:  Procedure Laterality Date  . CESAREAN SECTION  [id number]  . CHONDROPLASTY Right 08/22/2012   Procedure: CHONDROPLASTY;  Surgeon: Carole Civil, MD;  Location: [facility];  Service: Orthopedics;  Laterality: Right;  . COLONOSCOPY WITH PROPOFOL N/A 03/16/2014   Procedure: COLONOSCOPY WITH PROPOFOL (at cecum 1023, total withdrawal time=9 minutes);  Surgeon: Danie Binder, MD;  Location: [facility];  Service: Endoscopy;  Laterality: N/A;  . GASTRIC BYPASS    . KNEE ARTHROSCOPY WITH MEDIAL MENISECTOMY Right 08/22/2012   Procedure: KNEE ARTHROSCOPY WITH PARTIAL MEDIAL MENISECTOMY;  Surgeon: Carole Civil, MD;  Location: [facility];  Service: Orthopedics;  Laterality: Right;  .  PARTIAL NEPHRECTOMY  Dec 2014   left  . TENDON REPAIR      Family Psychiatric History: see below  Family History:  Family History  Problem Relation Age of Onset  . Heart attack Mother   . Heart attack Father   . Depression Paternal Aunt   . Alcohol abuse Maternal Uncle   . Colon cancer Maternal Aunt   . Colon cancer Maternal Uncle     Social History:  Social History   Socioeconomic History  . Marital status: Widowed    Spouse name: Not on file  . Number of children: Not on file  . Years of education: Not on  file  . Highest education level: Not on file  Occupational History  . Occupation: Visual merchandiser: [employer]  Tobacco Use  . Smoking status: Never Smoker  . Smokeless tobacco: Never Used  Substance and Sexual Activity  . Alcohol use: No    Alcohol/week: 0.0 standard drinks  . Drug use: No  . Sexual activity: Not on file  Other Topics Concern  . Not on file  Social History Narrative  . Not on file   Social Determinants of Health   Financial Resource Strain: Not on file  Food Insecurity: Not on file  Transportation Needs: Not on file  Physical Activity: Not on file  Stress: Not on file  Social Connections: Not on file    Allergies:  Allergies  Allergen Reactions  . Skelaxin [Metaxalone] Hives and Rash    Metabolic Disorder Labs: No results found for: HGBA1C, MPG No results found for: PROLACTIN Lab Results  Component Value Date   CHOL 215 (H) 03/11/2014   TRIG 288 (H) 03/11/2014   HDL 33 (L) 03/11/2014   CHOLHDL 6.5 03/11/2014   VLDL 58 (H) 03/11/2014   LDLCALC 124 (H) 03/11/2014   No results found for: TSH  Therapeutic Level Labs: No results found for: LITHIUM No results found for: VALPROATE No components found for:  CBMZ  Current Medications: Current Outpatient Medications  Medication Sig Dispense Refill  . ALPRAZolam (XANAX) 1 MG tablet Take 1 tablet (1 mg total) by mouth daily as needed for anxiety. 90 tablet 2  . amLODipine (NORVASC) 10 MG tablet Take 1 tablet by mouth daily.    Marland Kitchen buPROPion (WELLBUTRIN XL) 300 MG 24 hr tablet Take 1 tablet (300 mg total) by mouth every morning. 30 tablet 2  . calcitRIOL (ROCALTROL) 0.25 MCG capsule Take 1 capsule by mouth. 6 times weekly - Monday thru Saturday    . hydrALAZINE (APRESOLINE) 100 MG tablet TAKE 1 TABLET THREE TIMES DAILY 270 tablet 2  . labetalol (NORMODYNE) 200 MG tablet Take 1 tablet (200 mg total) by mouth 2 (two) times daily. 60 tablet 2  . lactulose (CONSTULOSE) 10 GM/15ML  solution Take 10 mLs by mouth 2 (two) times daily as needed for constipation.    Marland Kitchen levothyroxine (SYNTHROID, LEVOTHROID) 200 MCG tablet Take 1 tablet by mouth daily. X 6 days of the week & 354mcg on one day of the week    . losartan (COZAAR) 100 MG tablet Take 1 tablet (100 mg total) by mouth daily. 90 tablet 1  . meclizine (ANTIVERT) 25 MG tablet Take 1 tablet (25 mg total) by mouth every 8 (eight) hours as needed for dizziness. 90 tablet 0  . multivitamin (RENA-VIT) TABS tablet Take 1 tablet by mouth daily.    . prazosin (MINIPRESS) 5 MG capsule Take 1 capsule (5 mg total) by  mouth at bedtime. 90 capsule 2  . sertraline (ZOLOFT) 100 MG tablet Take 1 tablet (100 mg total) by mouth daily. 90 tablet 2  . sevelamer carbonate (RENVELA) 800 MG tablet Take 800 mg by mouth 3 (three) times daily. Does 400mg  twice a day with snacks    . Thiamine HCl (VITAMIN B1 PO) Take 1 tablet by mouth 3 (three) times a week.    Marland Kitchen tiZANidine (ZANAFLEX) 4 MG tablet TAKE 1 TABLET BY MOUTH EVERY DAY 30 tablet 1  . vitamin B-12 (CYANOCOBALAMIN) 500 MCG tablet Take 500 mcg by mouth daily.     No current facility-administered medications for this visit.     Musculoskeletal: Strength & Muscle Tone: within normal limits Gait & Station: normal Patient leans: N/A  Psychiatric Specialty Exam: Review of Systems  Psychiatric/Behavioral: Positive for dysphoric mood.    There were no vitals taken for this visit.There is no height or weight on file to calculate BMI.  General Appearance: Bizarre and Fairly Groomed  Eye Contact:  Good  Speech:  Clear and Coherent  Volume:  Normal  Mood:  Dysphoric  Affect:  Flat  Thought Process:  Goal Directed  Orientation:  Full (Time, Place, and Person)  Thought Content: Rumination   Suicidal Thoughts:  No  Homicidal Thoughts:  No  Memory:  Immediate;   Good Recent;   Good Remote;   Good  Judgement:  good  Insight:  Good  Psychomotor Activity:  Normal  Concentration:   Concentration: Good and Attention Span: Good  Recall:  Good  Fund of Knowledge: Good  Language: Good  Akathisia:  No  Handed:  Right  AIMS (if indicated): not done  Assets:  Communication Skills Desire for Improvement Resilience Social Support Talents/Skills  ADL's:  Intact  Cognition: WNL  Sleep:  Good   Screenings:   Assessment and Plan: This patient is a 59 year old female with a history of end-stage renal disease depression and anxiety.  She still has not gotten the Wellbutrin XL 300 mg daily so we will send it back to her local pharmacy.  She will continue Zoloft 100 mg daily for depression, prazosin 5 mg at bedtime to prevent nightmares and Xanax 1 mg daily as needed for anxiety.  She will return to see me in 2 months   [name], MD 03/14/2020, 8:56 AM

## 2020-03-15 DIAGNOSIS — N186 End stage renal disease: Secondary | ICD-10-CM | POA: Diagnosis not present

## 2020-03-15 DIAGNOSIS — D631 Anemia in chronic kidney disease: Secondary | ICD-10-CM | POA: Diagnosis not present

## 2020-03-15 DIAGNOSIS — N2581 Secondary hyperparathyroidism of renal origin: Secondary | ICD-10-CM | POA: Diagnosis not present

## 2020-03-16 DIAGNOSIS — D631 Anemia in chronic kidney disease: Secondary | ICD-10-CM | POA: Diagnosis not present

## 2020-03-16 DIAGNOSIS — N186 End stage renal disease: Secondary | ICD-10-CM | POA: Diagnosis not present

## 2020-03-16 DIAGNOSIS — N2581 Secondary hyperparathyroidism of renal origin: Secondary | ICD-10-CM | POA: Diagnosis not present

## 2020-03-17 DIAGNOSIS — N2581 Secondary hyperparathyroidism of renal origin: Secondary | ICD-10-CM | POA: Diagnosis not present

## 2020-03-17 DIAGNOSIS — D631 Anemia in chronic kidney disease: Secondary | ICD-10-CM | POA: Diagnosis not present

## 2020-03-17 DIAGNOSIS — T148XXA Other injury of unspecified body region, initial encounter: Secondary | ICD-10-CM | POA: Diagnosis not present

## 2020-03-17 DIAGNOSIS — M76822 Posterior tibial tendinitis, left leg: Secondary | ICD-10-CM | POA: Diagnosis not present

## 2020-03-17 DIAGNOSIS — Z888 Allergy status to other drugs, medicaments and biological substances status: Secondary | ICD-10-CM | POA: Diagnosis not present

## 2020-03-17 DIAGNOSIS — N186 End stage renal disease: Secondary | ICD-10-CM | POA: Diagnosis not present

## 2020-03-17 DIAGNOSIS — M2142 Flat foot [pes planus] (acquired), left foot: Secondary | ICD-10-CM | POA: Diagnosis not present

## 2020-03-17 DIAGNOSIS — Z881 Allergy status to other antibiotic agents status: Secondary | ICD-10-CM | POA: Diagnosis not present

## 2020-03-17 DIAGNOSIS — M76829 Posterior tibial tendinitis, unspecified leg: Secondary | ICD-10-CM | POA: Diagnosis not present

## 2020-03-18 DIAGNOSIS — D631 Anemia in chronic kidney disease: Secondary | ICD-10-CM | POA: Diagnosis not present

## 2020-03-18 DIAGNOSIS — N186 End stage renal disease: Secondary | ICD-10-CM | POA: Diagnosis not present

## 2020-03-18 DIAGNOSIS — N2581 Secondary hyperparathyroidism of renal origin: Secondary | ICD-10-CM | POA: Diagnosis not present

## 2020-03-19 DIAGNOSIS — N186 End stage renal disease: Secondary | ICD-10-CM | POA: Diagnosis not present

## 2020-03-19 DIAGNOSIS — D631 Anemia in chronic kidney disease: Secondary | ICD-10-CM | POA: Diagnosis not present

## 2020-03-19 DIAGNOSIS — N2581 Secondary hyperparathyroidism of renal origin: Secondary | ICD-10-CM | POA: Diagnosis not present

## 2020-03-20 DIAGNOSIS — N2581 Secondary hyperparathyroidism of renal origin: Secondary | ICD-10-CM | POA: Diagnosis not present

## 2020-03-20 DIAGNOSIS — N186 End stage renal disease: Secondary | ICD-10-CM | POA: Diagnosis not present

## 2020-03-20 DIAGNOSIS — D631 Anemia in chronic kidney disease: Secondary | ICD-10-CM | POA: Diagnosis not present

## 2020-03-21 DIAGNOSIS — N2581 Secondary hyperparathyroidism of renal origin: Secondary | ICD-10-CM | POA: Diagnosis not present

## 2020-03-21 DIAGNOSIS — N186 End stage renal disease: Secondary | ICD-10-CM | POA: Diagnosis not present

## 2020-03-21 DIAGNOSIS — D631 Anemia in chronic kidney disease: Secondary | ICD-10-CM | POA: Diagnosis not present

## 2020-03-22 DIAGNOSIS — D631 Anemia in chronic kidney disease: Secondary | ICD-10-CM | POA: Diagnosis not present

## 2020-03-22 DIAGNOSIS — N2581 Secondary hyperparathyroidism of renal origin: Secondary | ICD-10-CM | POA: Diagnosis not present

## 2020-03-22 DIAGNOSIS — N186 End stage renal disease: Secondary | ICD-10-CM | POA: Diagnosis not present

## 2020-03-23 DIAGNOSIS — D631 Anemia in chronic kidney disease: Secondary | ICD-10-CM | POA: Diagnosis not present

## 2020-03-23 DIAGNOSIS — N186 End stage renal disease: Secondary | ICD-10-CM | POA: Diagnosis not present

## 2020-03-23 DIAGNOSIS — N2581 Secondary hyperparathyroidism of renal origin: Secondary | ICD-10-CM | POA: Diagnosis not present

## 2020-03-24 DIAGNOSIS — D631 Anemia in chronic kidney disease: Secondary | ICD-10-CM | POA: Diagnosis not present

## 2020-03-24 DIAGNOSIS — N2581 Secondary hyperparathyroidism of renal origin: Secondary | ICD-10-CM | POA: Diagnosis not present

## 2020-03-24 DIAGNOSIS — N186 End stage renal disease: Secondary | ICD-10-CM | POA: Diagnosis not present

## 2020-03-25 DIAGNOSIS — N2581 Secondary hyperparathyroidism of renal origin: Secondary | ICD-10-CM | POA: Diagnosis not present

## 2020-03-25 DIAGNOSIS — D631 Anemia in chronic kidney disease: Secondary | ICD-10-CM | POA: Diagnosis not present

## 2020-03-25 DIAGNOSIS — N186 End stage renal disease: Secondary | ICD-10-CM | POA: Diagnosis not present

## 2020-03-26 DIAGNOSIS — D631 Anemia in chronic kidney disease: Secondary | ICD-10-CM | POA: Diagnosis not present

## 2020-03-26 DIAGNOSIS — N2581 Secondary hyperparathyroidism of renal origin: Secondary | ICD-10-CM | POA: Diagnosis not present

## 2020-03-26 DIAGNOSIS — N186 End stage renal disease: Secondary | ICD-10-CM | POA: Diagnosis not present

## 2020-03-27 DIAGNOSIS — D631 Anemia in chronic kidney disease: Secondary | ICD-10-CM | POA: Diagnosis not present

## 2020-03-27 DIAGNOSIS — N186 End stage renal disease: Secondary | ICD-10-CM | POA: Diagnosis not present

## 2020-03-27 DIAGNOSIS — R238 Other skin changes: Secondary | ICD-10-CM | POA: Diagnosis not present

## 2020-03-27 DIAGNOSIS — N2581 Secondary hyperparathyroidism of renal origin: Secondary | ICD-10-CM | POA: Diagnosis not present

## 2020-03-28 DIAGNOSIS — N186 End stage renal disease: Secondary | ICD-10-CM | POA: Diagnosis not present

## 2020-03-28 DIAGNOSIS — N2581 Secondary hyperparathyroidism of renal origin: Secondary | ICD-10-CM | POA: Diagnosis not present

## 2020-03-28 DIAGNOSIS — M542 Cervicalgia: Secondary | ICD-10-CM | POA: Diagnosis not present

## 2020-03-28 DIAGNOSIS — M25512 Pain in left shoulder: Secondary | ICD-10-CM | POA: Diagnosis not present

## 2020-03-28 DIAGNOSIS — R293 Abnormal posture: Secondary | ICD-10-CM | POA: Diagnosis not present

## 2020-03-28 DIAGNOSIS — M2569 Stiffness of other specified joint, not elsewhere classified: Secondary | ICD-10-CM | POA: Diagnosis not present

## 2020-03-28 DIAGNOSIS — M799 Soft tissue disorder, unspecified: Secondary | ICD-10-CM | POA: Diagnosis not present

## 2020-03-28 DIAGNOSIS — D631 Anemia in chronic kidney disease: Secondary | ICD-10-CM | POA: Diagnosis not present

## 2020-03-28 DIAGNOSIS — M6281 Muscle weakness (generalized): Secondary | ICD-10-CM | POA: Diagnosis not present

## 2020-03-28 DIAGNOSIS — M25511 Pain in right shoulder: Secondary | ICD-10-CM | POA: Diagnosis not present

## 2020-03-29 DIAGNOSIS — N2581 Secondary hyperparathyroidism of renal origin: Secondary | ICD-10-CM | POA: Diagnosis not present

## 2020-03-29 DIAGNOSIS — S90422A Blister (nonthermal), left great toe, initial encounter: Secondary | ICD-10-CM | POA: Diagnosis not present

## 2020-03-29 DIAGNOSIS — S90424A Blister (nonthermal), right lesser toe(s), initial encounter: Secondary | ICD-10-CM | POA: Diagnosis not present

## 2020-03-29 DIAGNOSIS — Z20822 Contact with and (suspected) exposure to covid-19: Secondary | ICD-10-CM | POA: Diagnosis not present

## 2020-03-29 DIAGNOSIS — N186 End stage renal disease: Secondary | ICD-10-CM | POA: Diagnosis not present

## 2020-03-29 DIAGNOSIS — D631 Anemia in chronic kidney disease: Secondary | ICD-10-CM | POA: Diagnosis not present

## 2020-03-29 DIAGNOSIS — L989 Disorder of the skin and subcutaneous tissue, unspecified: Secondary | ICD-10-CM | POA: Diagnosis not present

## 2020-03-30 DIAGNOSIS — M799 Soft tissue disorder, unspecified: Secondary | ICD-10-CM | POA: Diagnosis not present

## 2020-03-30 DIAGNOSIS — R293 Abnormal posture: Secondary | ICD-10-CM | POA: Diagnosis not present

## 2020-03-30 DIAGNOSIS — N2581 Secondary hyperparathyroidism of renal origin: Secondary | ICD-10-CM | POA: Diagnosis not present

## 2020-03-30 DIAGNOSIS — M2569 Stiffness of other specified joint, not elsewhere classified: Secondary | ICD-10-CM | POA: Diagnosis not present

## 2020-03-30 DIAGNOSIS — M25511 Pain in right shoulder: Secondary | ICD-10-CM | POA: Diagnosis not present

## 2020-03-30 DIAGNOSIS — M542 Cervicalgia: Secondary | ICD-10-CM | POA: Diagnosis not present

## 2020-03-30 DIAGNOSIS — N186 End stage renal disease: Secondary | ICD-10-CM | POA: Diagnosis not present

## 2020-03-30 DIAGNOSIS — D631 Anemia in chronic kidney disease: Secondary | ICD-10-CM | POA: Diagnosis not present

## 2020-03-30 DIAGNOSIS — M25512 Pain in left shoulder: Secondary | ICD-10-CM | POA: Diagnosis not present

## 2020-03-30 DIAGNOSIS — M6281 Muscle weakness (generalized): Secondary | ICD-10-CM | POA: Diagnosis not present

## 2020-03-31 DIAGNOSIS — D631 Anemia in chronic kidney disease: Secondary | ICD-10-CM | POA: Diagnosis not present

## 2020-03-31 DIAGNOSIS — N186 End stage renal disease: Secondary | ICD-10-CM | POA: Diagnosis not present

## 2020-03-31 DIAGNOSIS — N2581 Secondary hyperparathyroidism of renal origin: Secondary | ICD-10-CM | POA: Diagnosis not present

## 2020-04-01 DIAGNOSIS — N2581 Secondary hyperparathyroidism of renal origin: Secondary | ICD-10-CM | POA: Diagnosis not present

## 2020-04-01 DIAGNOSIS — N186 End stage renal disease: Secondary | ICD-10-CM | POA: Diagnosis not present

## 2020-04-01 DIAGNOSIS — D631 Anemia in chronic kidney disease: Secondary | ICD-10-CM | POA: Diagnosis not present

## 2020-04-01 DIAGNOSIS — Z992 Dependence on renal dialysis: Secondary | ICD-10-CM | POA: Diagnosis not present

## 2020-04-02 DIAGNOSIS — D509 Iron deficiency anemia, unspecified: Secondary | ICD-10-CM | POA: Diagnosis not present

## 2020-04-02 DIAGNOSIS — N2581 Secondary hyperparathyroidism of renal origin: Secondary | ICD-10-CM | POA: Diagnosis not present

## 2020-04-02 DIAGNOSIS — N186 End stage renal disease: Secondary | ICD-10-CM | POA: Diagnosis not present

## 2020-04-02 DIAGNOSIS — D631 Anemia in chronic kidney disease: Secondary | ICD-10-CM | POA: Diagnosis not present

## 2020-04-03 DIAGNOSIS — D509 Iron deficiency anemia, unspecified: Secondary | ICD-10-CM | POA: Diagnosis not present

## 2020-04-03 DIAGNOSIS — N2581 Secondary hyperparathyroidism of renal origin: Secondary | ICD-10-CM | POA: Diagnosis not present

## 2020-04-03 DIAGNOSIS — N186 End stage renal disease: Secondary | ICD-10-CM | POA: Diagnosis not present

## 2020-04-03 DIAGNOSIS — D631 Anemia in chronic kidney disease: Secondary | ICD-10-CM | POA: Diagnosis not present

## 2020-04-04 DIAGNOSIS — D631 Anemia in chronic kidney disease: Secondary | ICD-10-CM | POA: Diagnosis not present

## 2020-04-04 DIAGNOSIS — N2581 Secondary hyperparathyroidism of renal origin: Secondary | ICD-10-CM | POA: Diagnosis not present

## 2020-04-04 DIAGNOSIS — D509 Iron deficiency anemia, unspecified: Secondary | ICD-10-CM | POA: Diagnosis not present

## 2020-04-04 DIAGNOSIS — N186 End stage renal disease: Secondary | ICD-10-CM | POA: Diagnosis not present

## 2020-04-05 DIAGNOSIS — D509 Iron deficiency anemia, unspecified: Secondary | ICD-10-CM | POA: Diagnosis not present

## 2020-04-05 DIAGNOSIS — N186 End stage renal disease: Secondary | ICD-10-CM | POA: Diagnosis not present

## 2020-04-05 DIAGNOSIS — N2581 Secondary hyperparathyroidism of renal origin: Secondary | ICD-10-CM | POA: Diagnosis not present

## 2020-04-05 DIAGNOSIS — D631 Anemia in chronic kidney disease: Secondary | ICD-10-CM | POA: Diagnosis not present

## 2020-04-06 DIAGNOSIS — Z6831 Body mass index (BMI) 31.0-31.9, adult: Secondary | ICD-10-CM | POA: Diagnosis not present

## 2020-04-06 DIAGNOSIS — N186 End stage renal disease: Secondary | ICD-10-CM | POA: Diagnosis not present

## 2020-04-06 DIAGNOSIS — M25511 Pain in right shoulder: Secondary | ICD-10-CM | POA: Diagnosis not present

## 2020-04-06 DIAGNOSIS — M542 Cervicalgia: Secondary | ICD-10-CM | POA: Diagnosis not present

## 2020-04-06 DIAGNOSIS — R293 Abnormal posture: Secondary | ICD-10-CM | POA: Diagnosis not present

## 2020-04-06 DIAGNOSIS — M2569 Stiffness of other specified joint, not elsewhere classified: Secondary | ICD-10-CM | POA: Diagnosis not present

## 2020-04-06 DIAGNOSIS — N2581 Secondary hyperparathyroidism of renal origin: Secondary | ICD-10-CM | POA: Diagnosis not present

## 2020-04-06 DIAGNOSIS — D509 Iron deficiency anemia, unspecified: Secondary | ICD-10-CM | POA: Diagnosis not present

## 2020-04-06 DIAGNOSIS — Z01419 Encounter for gynecological examination (general) (routine) without abnormal findings: Secondary | ICD-10-CM | POA: Diagnosis not present

## 2020-04-06 DIAGNOSIS — M799 Soft tissue disorder, unspecified: Secondary | ICD-10-CM | POA: Diagnosis not present

## 2020-04-06 DIAGNOSIS — M6281 Muscle weakness (generalized): Secondary | ICD-10-CM | POA: Diagnosis not present

## 2020-04-06 DIAGNOSIS — D631 Anemia in chronic kidney disease: Secondary | ICD-10-CM | POA: Diagnosis not present

## 2020-04-06 DIAGNOSIS — M25512 Pain in left shoulder: Secondary | ICD-10-CM | POA: Diagnosis not present

## 2020-04-07 DIAGNOSIS — N186 End stage renal disease: Secondary | ICD-10-CM | POA: Diagnosis not present

## 2020-04-07 DIAGNOSIS — D631 Anemia in chronic kidney disease: Secondary | ICD-10-CM | POA: Diagnosis not present

## 2020-04-07 DIAGNOSIS — D509 Iron deficiency anemia, unspecified: Secondary | ICD-10-CM | POA: Diagnosis not present

## 2020-04-07 DIAGNOSIS — N2581 Secondary hyperparathyroidism of renal origin: Secondary | ICD-10-CM | POA: Diagnosis not present

## 2020-04-08 DIAGNOSIS — M25511 Pain in right shoulder: Secondary | ICD-10-CM | POA: Diagnosis not present

## 2020-04-08 DIAGNOSIS — M542 Cervicalgia: Secondary | ICD-10-CM | POA: Diagnosis not present

## 2020-04-08 DIAGNOSIS — M25512 Pain in left shoulder: Secondary | ICD-10-CM | POA: Diagnosis not present

## 2020-04-08 DIAGNOSIS — D631 Anemia in chronic kidney disease: Secondary | ICD-10-CM | POA: Diagnosis not present

## 2020-04-08 DIAGNOSIS — M2569 Stiffness of other specified joint, not elsewhere classified: Secondary | ICD-10-CM | POA: Diagnosis not present

## 2020-04-08 DIAGNOSIS — D509 Iron deficiency anemia, unspecified: Secondary | ICD-10-CM | POA: Diagnosis not present

## 2020-04-08 DIAGNOSIS — M799 Soft tissue disorder, unspecified: Secondary | ICD-10-CM | POA: Diagnosis not present

## 2020-04-08 DIAGNOSIS — R293 Abnormal posture: Secondary | ICD-10-CM | POA: Diagnosis not present

## 2020-04-08 DIAGNOSIS — N186 End stage renal disease: Secondary | ICD-10-CM | POA: Diagnosis not present

## 2020-04-08 DIAGNOSIS — M6281 Muscle weakness (generalized): Secondary | ICD-10-CM | POA: Diagnosis not present

## 2020-04-08 DIAGNOSIS — N2581 Secondary hyperparathyroidism of renal origin: Secondary | ICD-10-CM | POA: Diagnosis not present

## 2020-04-09 DIAGNOSIS — D631 Anemia in chronic kidney disease: Secondary | ICD-10-CM | POA: Diagnosis not present

## 2020-04-09 DIAGNOSIS — N186 End stage renal disease: Secondary | ICD-10-CM | POA: Diagnosis not present

## 2020-04-09 DIAGNOSIS — D509 Iron deficiency anemia, unspecified: Secondary | ICD-10-CM | POA: Diagnosis not present

## 2020-04-09 DIAGNOSIS — N2581 Secondary hyperparathyroidism of renal origin: Secondary | ICD-10-CM | POA: Diagnosis not present

## 2020-04-10 DIAGNOSIS — D631 Anemia in chronic kidney disease: Secondary | ICD-10-CM | POA: Diagnosis not present

## 2020-04-10 DIAGNOSIS — D509 Iron deficiency anemia, unspecified: Secondary | ICD-10-CM | POA: Diagnosis not present

## 2020-04-10 DIAGNOSIS — N2581 Secondary hyperparathyroidism of renal origin: Secondary | ICD-10-CM | POA: Diagnosis not present

## 2020-04-10 DIAGNOSIS — N186 End stage renal disease: Secondary | ICD-10-CM | POA: Diagnosis not present

## 2020-04-11 DIAGNOSIS — M2569 Stiffness of other specified joint, not elsewhere classified: Secondary | ICD-10-CM | POA: Diagnosis not present

## 2020-04-11 DIAGNOSIS — M799 Soft tissue disorder, unspecified: Secondary | ICD-10-CM | POA: Diagnosis not present

## 2020-04-11 DIAGNOSIS — D631 Anemia in chronic kidney disease: Secondary | ICD-10-CM | POA: Diagnosis not present

## 2020-04-11 DIAGNOSIS — M25512 Pain in left shoulder: Secondary | ICD-10-CM | POA: Diagnosis not present

## 2020-04-11 DIAGNOSIS — M542 Cervicalgia: Secondary | ICD-10-CM | POA: Diagnosis not present

## 2020-04-11 DIAGNOSIS — D509 Iron deficiency anemia, unspecified: Secondary | ICD-10-CM | POA: Diagnosis not present

## 2020-04-11 DIAGNOSIS — M6281 Muscle weakness (generalized): Secondary | ICD-10-CM | POA: Diagnosis not present

## 2020-04-11 DIAGNOSIS — N2581 Secondary hyperparathyroidism of renal origin: Secondary | ICD-10-CM | POA: Diagnosis not present

## 2020-04-11 DIAGNOSIS — R293 Abnormal posture: Secondary | ICD-10-CM | POA: Diagnosis not present

## 2020-04-11 DIAGNOSIS — M25511 Pain in right shoulder: Secondary | ICD-10-CM | POA: Diagnosis not present

## 2020-04-11 DIAGNOSIS — N186 End stage renal disease: Secondary | ICD-10-CM | POA: Diagnosis not present

## 2020-04-12 DIAGNOSIS — N2581 Secondary hyperparathyroidism of renal origin: Secondary | ICD-10-CM | POA: Diagnosis not present

## 2020-04-12 DIAGNOSIS — D509 Iron deficiency anemia, unspecified: Secondary | ICD-10-CM | POA: Diagnosis not present

## 2020-04-12 DIAGNOSIS — D631 Anemia in chronic kidney disease: Secondary | ICD-10-CM | POA: Diagnosis not present

## 2020-04-12 DIAGNOSIS — N186 End stage renal disease: Secondary | ICD-10-CM | POA: Diagnosis not present

## 2020-04-13 DIAGNOSIS — N186 End stage renal disease: Secondary | ICD-10-CM | POA: Diagnosis not present

## 2020-04-13 DIAGNOSIS — N2581 Secondary hyperparathyroidism of renal origin: Secondary | ICD-10-CM | POA: Diagnosis not present

## 2020-04-13 DIAGNOSIS — D509 Iron deficiency anemia, unspecified: Secondary | ICD-10-CM | POA: Diagnosis not present

## 2020-04-13 DIAGNOSIS — Z5181 Encounter for therapeutic drug level monitoring: Secondary | ICD-10-CM | POA: Diagnosis not present

## 2020-04-13 DIAGNOSIS — L2084 Intrinsic (allergic) eczema: Secondary | ICD-10-CM | POA: Diagnosis not present

## 2020-04-13 DIAGNOSIS — D631 Anemia in chronic kidney disease: Secondary | ICD-10-CM | POA: Diagnosis not present

## 2020-04-14 DIAGNOSIS — N2581 Secondary hyperparathyroidism of renal origin: Secondary | ICD-10-CM | POA: Diagnosis not present

## 2020-04-14 DIAGNOSIS — D631 Anemia in chronic kidney disease: Secondary | ICD-10-CM | POA: Diagnosis not present

## 2020-04-14 DIAGNOSIS — N186 End stage renal disease: Secondary | ICD-10-CM | POA: Diagnosis not present

## 2020-04-14 DIAGNOSIS — D509 Iron deficiency anemia, unspecified: Secondary | ICD-10-CM | POA: Diagnosis not present

## 2020-04-15 DIAGNOSIS — D631 Anemia in chronic kidney disease: Secondary | ICD-10-CM | POA: Diagnosis not present

## 2020-04-15 DIAGNOSIS — D509 Iron deficiency anemia, unspecified: Secondary | ICD-10-CM | POA: Diagnosis not present

## 2020-04-15 DIAGNOSIS — N186 End stage renal disease: Secondary | ICD-10-CM | POA: Diagnosis not present

## 2020-04-15 DIAGNOSIS — N2581 Secondary hyperparathyroidism of renal origin: Secondary | ICD-10-CM | POA: Diagnosis not present

## 2020-04-16 DIAGNOSIS — N2581 Secondary hyperparathyroidism of renal origin: Secondary | ICD-10-CM | POA: Diagnosis not present

## 2020-04-16 DIAGNOSIS — N186 End stage renal disease: Secondary | ICD-10-CM | POA: Diagnosis not present

## 2020-04-16 DIAGNOSIS — D631 Anemia in chronic kidney disease: Secondary | ICD-10-CM | POA: Diagnosis not present

## 2020-04-16 DIAGNOSIS — D509 Iron deficiency anemia, unspecified: Secondary | ICD-10-CM | POA: Diagnosis not present

## 2020-04-17 DIAGNOSIS — D509 Iron deficiency anemia, unspecified: Secondary | ICD-10-CM | POA: Diagnosis not present

## 2020-04-17 DIAGNOSIS — N186 End stage renal disease: Secondary | ICD-10-CM | POA: Diagnosis not present

## 2020-04-17 DIAGNOSIS — N2581 Secondary hyperparathyroidism of renal origin: Secondary | ICD-10-CM | POA: Diagnosis not present

## 2020-04-17 DIAGNOSIS — D631 Anemia in chronic kidney disease: Secondary | ICD-10-CM | POA: Diagnosis not present

## 2020-04-18 ENCOUNTER — Ambulatory Visit: Payer: Medicare PPO | Admitting: Podiatry

## 2020-04-18 DIAGNOSIS — N2581 Secondary hyperparathyroidism of renal origin: Secondary | ICD-10-CM | POA: Diagnosis not present

## 2020-04-18 DIAGNOSIS — N186 End stage renal disease: Secondary | ICD-10-CM | POA: Diagnosis not present

## 2020-04-18 DIAGNOSIS — D631 Anemia in chronic kidney disease: Secondary | ICD-10-CM | POA: Diagnosis not present

## 2020-04-18 DIAGNOSIS — D509 Iron deficiency anemia, unspecified: Secondary | ICD-10-CM | POA: Diagnosis not present

## 2020-04-19 DIAGNOSIS — H5789 Other specified disorders of eye and adnexa: Secondary | ICD-10-CM | POA: Diagnosis not present

## 2020-04-19 DIAGNOSIS — D509 Iron deficiency anemia, unspecified: Secondary | ICD-10-CM | POA: Diagnosis not present

## 2020-04-19 DIAGNOSIS — L2084 Intrinsic (allergic) eczema: Secondary | ICD-10-CM | POA: Diagnosis not present

## 2020-04-19 DIAGNOSIS — L209 Atopic dermatitis, unspecified: Secondary | ICD-10-CM | POA: Diagnosis not present

## 2020-04-19 DIAGNOSIS — Z79899 Other long term (current) drug therapy: Secondary | ICD-10-CM | POA: Diagnosis not present

## 2020-04-19 DIAGNOSIS — N186 End stage renal disease: Secondary | ICD-10-CM | POA: Diagnosis not present

## 2020-04-19 DIAGNOSIS — N2581 Secondary hyperparathyroidism of renal origin: Secondary | ICD-10-CM | POA: Diagnosis not present

## 2020-04-19 DIAGNOSIS — D631 Anemia in chronic kidney disease: Secondary | ICD-10-CM | POA: Diagnosis not present

## 2020-04-20 ENCOUNTER — Ambulatory Visit: Payer: Medicare PPO | Admitting: Podiatry

## 2020-04-20 DIAGNOSIS — N186 End stage renal disease: Secondary | ICD-10-CM | POA: Diagnosis not present

## 2020-04-20 DIAGNOSIS — N2581 Secondary hyperparathyroidism of renal origin: Secondary | ICD-10-CM | POA: Diagnosis not present

## 2020-04-20 DIAGNOSIS — D631 Anemia in chronic kidney disease: Secondary | ICD-10-CM | POA: Diagnosis not present

## 2020-04-20 DIAGNOSIS — D509 Iron deficiency anemia, unspecified: Secondary | ICD-10-CM | POA: Diagnosis not present

## 2020-04-21 DIAGNOSIS — N186 End stage renal disease: Secondary | ICD-10-CM | POA: Diagnosis not present

## 2020-04-21 DIAGNOSIS — D509 Iron deficiency anemia, unspecified: Secondary | ICD-10-CM | POA: Diagnosis not present

## 2020-04-21 DIAGNOSIS — N2581 Secondary hyperparathyroidism of renal origin: Secondary | ICD-10-CM | POA: Diagnosis not present

## 2020-04-21 DIAGNOSIS — D631 Anemia in chronic kidney disease: Secondary | ICD-10-CM | POA: Diagnosis not present

## 2020-04-22 DIAGNOSIS — D631 Anemia in chronic kidney disease: Secondary | ICD-10-CM | POA: Diagnosis not present

## 2020-04-22 DIAGNOSIS — N2581 Secondary hyperparathyroidism of renal origin: Secondary | ICD-10-CM | POA: Diagnosis not present

## 2020-04-22 DIAGNOSIS — N186 End stage renal disease: Secondary | ICD-10-CM | POA: Diagnosis not present

## 2020-04-22 DIAGNOSIS — D509 Iron deficiency anemia, unspecified: Secondary | ICD-10-CM | POA: Diagnosis not present

## 2020-04-23 DIAGNOSIS — N2581 Secondary hyperparathyroidism of renal origin: Secondary | ICD-10-CM | POA: Diagnosis not present

## 2020-04-23 DIAGNOSIS — N186 End stage renal disease: Secondary | ICD-10-CM | POA: Diagnosis not present

## 2020-04-23 DIAGNOSIS — D631 Anemia in chronic kidney disease: Secondary | ICD-10-CM | POA: Diagnosis not present

## 2020-04-23 DIAGNOSIS — D509 Iron deficiency anemia, unspecified: Secondary | ICD-10-CM | POA: Diagnosis not present

## 2020-04-24 DIAGNOSIS — D631 Anemia in chronic kidney disease: Secondary | ICD-10-CM | POA: Diagnosis not present

## 2020-04-24 DIAGNOSIS — N186 End stage renal disease: Secondary | ICD-10-CM | POA: Diagnosis not present

## 2020-04-24 DIAGNOSIS — D509 Iron deficiency anemia, unspecified: Secondary | ICD-10-CM | POA: Diagnosis not present

## 2020-04-24 DIAGNOSIS — N2581 Secondary hyperparathyroidism of renal origin: Secondary | ICD-10-CM | POA: Diagnosis not present

## 2020-04-25 DIAGNOSIS — N2581 Secondary hyperparathyroidism of renal origin: Secondary | ICD-10-CM | POA: Diagnosis not present

## 2020-04-25 DIAGNOSIS — D631 Anemia in chronic kidney disease: Secondary | ICD-10-CM | POA: Diagnosis not present

## 2020-04-25 DIAGNOSIS — N186 End stage renal disease: Secondary | ICD-10-CM | POA: Diagnosis not present

## 2020-04-25 DIAGNOSIS — D509 Iron deficiency anemia, unspecified: Secondary | ICD-10-CM | POA: Diagnosis not present

## 2020-04-26 DIAGNOSIS — N186 End stage renal disease: Secondary | ICD-10-CM | POA: Diagnosis not present

## 2020-04-26 DIAGNOSIS — R635 Abnormal weight gain: Secondary | ICD-10-CM | POA: Diagnosis not present

## 2020-04-26 DIAGNOSIS — N2581 Secondary hyperparathyroidism of renal origin: Secondary | ICD-10-CM | POA: Diagnosis not present

## 2020-04-26 DIAGNOSIS — D631 Anemia in chronic kidney disease: Secondary | ICD-10-CM | POA: Diagnosis not present

## 2020-04-26 DIAGNOSIS — R799 Abnormal finding of blood chemistry, unspecified: Secondary | ICD-10-CM | POA: Diagnosis not present

## 2020-04-26 DIAGNOSIS — Z8639 Personal history of other endocrine, nutritional and metabolic disease: Secondary | ICD-10-CM | POA: Diagnosis not present

## 2020-04-26 DIAGNOSIS — I10 Essential (primary) hypertension: Secondary | ICD-10-CM | POA: Diagnosis not present

## 2020-04-26 DIAGNOSIS — D509 Iron deficiency anemia, unspecified: Secondary | ICD-10-CM | POA: Diagnosis not present

## 2020-04-26 DIAGNOSIS — Z6830 Body mass index (BMI) 30.0-30.9, adult: Secondary | ICD-10-CM | POA: Diagnosis not present

## 2020-04-27 DIAGNOSIS — L2084 Intrinsic (allergic) eczema: Secondary | ICD-10-CM | POA: Diagnosis not present

## 2020-04-27 DIAGNOSIS — N186 End stage renal disease: Secondary | ICD-10-CM | POA: Diagnosis not present

## 2020-04-27 DIAGNOSIS — N2581 Secondary hyperparathyroidism of renal origin: Secondary | ICD-10-CM | POA: Diagnosis not present

## 2020-04-27 DIAGNOSIS — Z79899 Other long term (current) drug therapy: Secondary | ICD-10-CM | POA: Diagnosis not present

## 2020-04-27 DIAGNOSIS — D631 Anemia in chronic kidney disease: Secondary | ICD-10-CM | POA: Diagnosis not present

## 2020-04-27 DIAGNOSIS — D509 Iron deficiency anemia, unspecified: Secondary | ICD-10-CM | POA: Diagnosis not present

## 2020-04-28 DIAGNOSIS — N186 End stage renal disease: Secondary | ICD-10-CM | POA: Diagnosis not present

## 2020-04-28 DIAGNOSIS — D631 Anemia in chronic kidney disease: Secondary | ICD-10-CM | POA: Diagnosis not present

## 2020-04-28 DIAGNOSIS — D509 Iron deficiency anemia, unspecified: Secondary | ICD-10-CM | POA: Diagnosis not present

## 2020-04-28 DIAGNOSIS — N2581 Secondary hyperparathyroidism of renal origin: Secondary | ICD-10-CM | POA: Diagnosis not present

## 2020-04-29 DIAGNOSIS — N2581 Secondary hyperparathyroidism of renal origin: Secondary | ICD-10-CM | POA: Diagnosis not present

## 2020-04-29 DIAGNOSIS — N186 End stage renal disease: Secondary | ICD-10-CM | POA: Diagnosis not present

## 2020-04-29 DIAGNOSIS — D509 Iron deficiency anemia, unspecified: Secondary | ICD-10-CM | POA: Diagnosis not present

## 2020-04-29 DIAGNOSIS — D631 Anemia in chronic kidney disease: Secondary | ICD-10-CM | POA: Diagnosis not present

## 2020-04-30 DIAGNOSIS — D509 Iron deficiency anemia, unspecified: Secondary | ICD-10-CM | POA: Diagnosis not present

## 2020-04-30 DIAGNOSIS — N186 End stage renal disease: Secondary | ICD-10-CM | POA: Diagnosis not present

## 2020-04-30 DIAGNOSIS — D631 Anemia in chronic kidney disease: Secondary | ICD-10-CM | POA: Diagnosis not present

## 2020-04-30 DIAGNOSIS — N2581 Secondary hyperparathyroidism of renal origin: Secondary | ICD-10-CM | POA: Diagnosis not present

## 2020-05-01 DIAGNOSIS — N2581 Secondary hyperparathyroidism of renal origin: Secondary | ICD-10-CM | POA: Diagnosis not present

## 2020-05-01 DIAGNOSIS — D631 Anemia in chronic kidney disease: Secondary | ICD-10-CM | POA: Diagnosis not present

## 2020-05-01 DIAGNOSIS — D509 Iron deficiency anemia, unspecified: Secondary | ICD-10-CM | POA: Diagnosis not present

## 2020-05-01 DIAGNOSIS — N186 End stage renal disease: Secondary | ICD-10-CM | POA: Diagnosis not present

## 2020-05-02 DIAGNOSIS — N2581 Secondary hyperparathyroidism of renal origin: Secondary | ICD-10-CM | POA: Diagnosis not present

## 2020-05-02 DIAGNOSIS — N186 End stage renal disease: Secondary | ICD-10-CM | POA: Diagnosis not present

## 2020-05-02 DIAGNOSIS — D509 Iron deficiency anemia, unspecified: Secondary | ICD-10-CM | POA: Diagnosis not present

## 2020-05-02 DIAGNOSIS — D631 Anemia in chronic kidney disease: Secondary | ICD-10-CM | POA: Diagnosis not present

## 2020-05-03 DIAGNOSIS — N186 End stage renal disease: Secondary | ICD-10-CM | POA: Diagnosis not present

## 2020-05-03 DIAGNOSIS — D509 Iron deficiency anemia, unspecified: Secondary | ICD-10-CM | POA: Diagnosis not present

## 2020-05-03 DIAGNOSIS — D631 Anemia in chronic kidney disease: Secondary | ICD-10-CM | POA: Diagnosis not present

## 2020-05-03 DIAGNOSIS — N2581 Secondary hyperparathyroidism of renal origin: Secondary | ICD-10-CM | POA: Diagnosis not present

## 2020-05-04 DIAGNOSIS — D509 Iron deficiency anemia, unspecified: Secondary | ICD-10-CM | POA: Diagnosis not present

## 2020-05-04 DIAGNOSIS — N2581 Secondary hyperparathyroidism of renal origin: Secondary | ICD-10-CM | POA: Diagnosis not present

## 2020-05-04 DIAGNOSIS — D631 Anemia in chronic kidney disease: Secondary | ICD-10-CM | POA: Diagnosis not present

## 2020-05-04 DIAGNOSIS — N186 End stage renal disease: Secondary | ICD-10-CM | POA: Diagnosis not present

## 2020-05-05 DIAGNOSIS — N2581 Secondary hyperparathyroidism of renal origin: Secondary | ICD-10-CM | POA: Diagnosis not present

## 2020-05-05 DIAGNOSIS — D509 Iron deficiency anemia, unspecified: Secondary | ICD-10-CM | POA: Diagnosis not present

## 2020-05-05 DIAGNOSIS — N186 End stage renal disease: Secondary | ICD-10-CM | POA: Diagnosis not present

## 2020-05-05 DIAGNOSIS — D631 Anemia in chronic kidney disease: Secondary | ICD-10-CM | POA: Diagnosis not present

## 2020-05-06 DIAGNOSIS — N2581 Secondary hyperparathyroidism of renal origin: Secondary | ICD-10-CM | POA: Diagnosis not present

## 2020-05-06 DIAGNOSIS — N186 End stage renal disease: Secondary | ICD-10-CM | POA: Diagnosis not present

## 2020-05-06 DIAGNOSIS — D631 Anemia in chronic kidney disease: Secondary | ICD-10-CM | POA: Diagnosis not present

## 2020-05-06 DIAGNOSIS — D509 Iron deficiency anemia, unspecified: Secondary | ICD-10-CM | POA: Diagnosis not present

## 2020-05-07 DIAGNOSIS — D509 Iron deficiency anemia, unspecified: Secondary | ICD-10-CM | POA: Diagnosis not present

## 2020-05-07 DIAGNOSIS — D631 Anemia in chronic kidney disease: Secondary | ICD-10-CM | POA: Diagnosis not present

## 2020-05-07 DIAGNOSIS — N2581 Secondary hyperparathyroidism of renal origin: Secondary | ICD-10-CM | POA: Diagnosis not present

## 2020-05-07 DIAGNOSIS — N186 End stage renal disease: Secondary | ICD-10-CM | POA: Diagnosis not present

## 2020-05-08 DIAGNOSIS — N186 End stage renal disease: Secondary | ICD-10-CM | POA: Diagnosis not present

## 2020-05-08 DIAGNOSIS — N2581 Secondary hyperparathyroidism of renal origin: Secondary | ICD-10-CM | POA: Diagnosis not present

## 2020-05-08 DIAGNOSIS — D631 Anemia in chronic kidney disease: Secondary | ICD-10-CM | POA: Diagnosis not present

## 2020-05-08 DIAGNOSIS — D509 Iron deficiency anemia, unspecified: Secondary | ICD-10-CM | POA: Diagnosis not present

## 2020-05-09 ENCOUNTER — Other Ambulatory Visit: Payer: Self-pay

## 2020-05-09 DIAGNOSIS — N2581 Secondary hyperparathyroidism of renal origin: Secondary | ICD-10-CM | POA: Diagnosis not present

## 2020-05-09 DIAGNOSIS — D509 Iron deficiency anemia, unspecified: Secondary | ICD-10-CM | POA: Diagnosis not present

## 2020-05-09 DIAGNOSIS — Z114 Encounter for screening for human immunodeficiency virus [HIV]: Secondary | ICD-10-CM | POA: Diagnosis not present

## 2020-05-09 DIAGNOSIS — D631 Anemia in chronic kidney disease: Secondary | ICD-10-CM | POA: Diagnosis not present

## 2020-05-09 DIAGNOSIS — N186 End stage renal disease: Secondary | ICD-10-CM | POA: Diagnosis not present

## 2020-05-09 DIAGNOSIS — Z7689 Persons encountering health services in other specified circumstances: Secondary | ICD-10-CM | POA: Diagnosis not present

## 2020-05-09 DIAGNOSIS — L2084 Intrinsic (allergic) eczema: Secondary | ICD-10-CM | POA: Diagnosis not present

## 2020-05-09 DIAGNOSIS — Z1159 Encounter for screening for other viral diseases: Secondary | ICD-10-CM | POA: Diagnosis not present

## 2020-05-09 MED ORDER — LOSARTAN POTASSIUM 100 MG PO TABS: 100.0000 mg | ORAL_TABLET | Freq: Every day | ORAL | 3 refills | Status: DC

## 2020-05-09 NOTE — Telephone Encounter (Signed)
Medication refill request approved and sent to [facility].

## 2020-05-10 ENCOUNTER — Telehealth (HOSPITAL_COMMUNITY): Payer: Self-pay | Admitting: Psychiatry

## 2020-05-10 DIAGNOSIS — N186 End stage renal disease: Secondary | ICD-10-CM | POA: Diagnosis not present

## 2020-05-10 DIAGNOSIS — D631 Anemia in chronic kidney disease: Secondary | ICD-10-CM | POA: Diagnosis not present

## 2020-05-10 DIAGNOSIS — N2581 Secondary hyperparathyroidism of renal origin: Secondary | ICD-10-CM | POA: Diagnosis not present

## 2020-05-10 DIAGNOSIS — D509 Iron deficiency anemia, unspecified: Secondary | ICD-10-CM | POA: Diagnosis not present

## 2020-05-10 NOTE — Telephone Encounter (Signed)
Called to schedule f/u appt left voicemail

## 2020-05-11 DIAGNOSIS — N186 End stage renal disease: Secondary | ICD-10-CM | POA: Diagnosis not present

## 2020-05-11 DIAGNOSIS — R635 Abnormal weight gain: Secondary | ICD-10-CM | POA: Diagnosis not present

## 2020-05-11 DIAGNOSIS — N2581 Secondary hyperparathyroidism of renal origin: Secondary | ICD-10-CM | POA: Diagnosis not present

## 2020-05-11 DIAGNOSIS — D631 Anemia in chronic kidney disease: Secondary | ICD-10-CM | POA: Diagnosis not present

## 2020-05-11 DIAGNOSIS — D509 Iron deficiency anemia, unspecified: Secondary | ICD-10-CM | POA: Diagnosis not present

## 2020-05-12 DIAGNOSIS — D631 Anemia in chronic kidney disease: Secondary | ICD-10-CM | POA: Diagnosis not present

## 2020-05-12 DIAGNOSIS — N2581 Secondary hyperparathyroidism of renal origin: Secondary | ICD-10-CM | POA: Diagnosis not present

## 2020-05-12 DIAGNOSIS — N186 End stage renal disease: Secondary | ICD-10-CM | POA: Diagnosis not present

## 2020-05-12 DIAGNOSIS — D509 Iron deficiency anemia, unspecified: Secondary | ICD-10-CM | POA: Diagnosis not present

## 2020-05-13 DIAGNOSIS — D509 Iron deficiency anemia, unspecified: Secondary | ICD-10-CM | POA: Diagnosis not present

## 2020-05-13 DIAGNOSIS — N186 End stage renal disease: Secondary | ICD-10-CM | POA: Diagnosis not present

## 2020-05-13 DIAGNOSIS — N2581 Secondary hyperparathyroidism of renal origin: Secondary | ICD-10-CM | POA: Diagnosis not present

## 2020-05-13 DIAGNOSIS — D631 Anemia in chronic kidney disease: Secondary | ICD-10-CM | POA: Diagnosis not present

## 2020-05-14 DIAGNOSIS — D509 Iron deficiency anemia, unspecified: Secondary | ICD-10-CM | POA: Diagnosis not present

## 2020-05-14 DIAGNOSIS — N186 End stage renal disease: Secondary | ICD-10-CM | POA: Diagnosis not present

## 2020-05-14 DIAGNOSIS — N2581 Secondary hyperparathyroidism of renal origin: Secondary | ICD-10-CM | POA: Diagnosis not present

## 2020-05-14 DIAGNOSIS — D631 Anemia in chronic kidney disease: Secondary | ICD-10-CM | POA: Diagnosis not present

## 2020-05-15 ENCOUNTER — Other Ambulatory Visit: Payer: Self-pay | Admitting: Cardiology

## 2020-05-15 DIAGNOSIS — N2581 Secondary hyperparathyroidism of renal origin: Secondary | ICD-10-CM | POA: Diagnosis not present

## 2020-05-15 DIAGNOSIS — D509 Iron deficiency anemia, unspecified: Secondary | ICD-10-CM | POA: Diagnosis not present

## 2020-05-15 DIAGNOSIS — D631 Anemia in chronic kidney disease: Secondary | ICD-10-CM | POA: Diagnosis not present

## 2020-05-15 DIAGNOSIS — N186 End stage renal disease: Secondary | ICD-10-CM | POA: Diagnosis not present

## 2020-05-16 DIAGNOSIS — N186 End stage renal disease: Secondary | ICD-10-CM | POA: Diagnosis not present

## 2020-05-16 DIAGNOSIS — N2581 Secondary hyperparathyroidism of renal origin: Secondary | ICD-10-CM | POA: Diagnosis not present

## 2020-05-16 DIAGNOSIS — D631 Anemia in chronic kidney disease: Secondary | ICD-10-CM | POA: Diagnosis not present

## 2020-05-16 DIAGNOSIS — D509 Iron deficiency anemia, unspecified: Secondary | ICD-10-CM | POA: Diagnosis not present

## 2020-05-17 DIAGNOSIS — N2581 Secondary hyperparathyroidism of renal origin: Secondary | ICD-10-CM | POA: Diagnosis not present

## 2020-05-17 DIAGNOSIS — D631 Anemia in chronic kidney disease: Secondary | ICD-10-CM | POA: Diagnosis not present

## 2020-05-17 DIAGNOSIS — D509 Iron deficiency anemia, unspecified: Secondary | ICD-10-CM | POA: Diagnosis not present

## 2020-05-17 DIAGNOSIS — N186 End stage renal disease: Secondary | ICD-10-CM | POA: Diagnosis not present

## 2020-05-18 ENCOUNTER — Encounter (HOSPITAL_COMMUNITY): Payer: Self-pay | Admitting: Psychiatry

## 2020-05-18 ENCOUNTER — Other Ambulatory Visit: Payer: Self-pay

## 2020-05-18 ENCOUNTER — Telehealth (INDEPENDENT_AMBULATORY_CARE_PROVIDER_SITE_OTHER): Payer: Medicare PPO | Admitting: Psychiatry

## 2020-05-18 DIAGNOSIS — N2581 Secondary hyperparathyroidism of renal origin: Secondary | ICD-10-CM | POA: Diagnosis not present

## 2020-05-18 DIAGNOSIS — F322 Major depressive disorder, single episode, severe without psychotic features: Secondary | ICD-10-CM

## 2020-05-18 DIAGNOSIS — N186 End stage renal disease: Secondary | ICD-10-CM | POA: Diagnosis not present

## 2020-05-18 DIAGNOSIS — D631 Anemia in chronic kidney disease: Secondary | ICD-10-CM | POA: Diagnosis not present

## 2020-05-18 DIAGNOSIS — D509 Iron deficiency anemia, unspecified: Secondary | ICD-10-CM | POA: Diagnosis not present

## 2020-05-18 MED ORDER — SERTRALINE HCL 100 MG PO TABS: 100.0000 mg | ORAL_TABLET | Freq: Every day | ORAL | 2 refills | Status: DC

## 2020-05-18 MED ORDER — PRAZOSIN HCL 5 MG PO CAPS: 5.0000 mg | ORAL_CAPSULE | Freq: Every day | ORAL | 2 refills | Status: DC

## 2020-05-18 MED ORDER — BUPROPION HCL ER (XL) 300 MG PO TB24: 300.0000 mg | ORAL_TABLET | ORAL | 2 refills | Status: DC

## 2020-05-18 MED ORDER — ALPRAZOLAM 1 MG PO TABS: 1.0000 mg | ORAL_TABLET | Freq: Every day | ORAL | 2 refills | Status: DC | PRN

## 2020-05-18 NOTE — Progress Notes (Signed)
Virtual Visit via Video Note  I connected with April Ward on 05/18/20 at  3:00 PM EST by a video enabled telemedicine application and verified that I am speaking with the correct person using two identifiers.  Location: Patient: home Provider: home   I discussed the limitations of evaluation and management by telemedicine and the availability of in person appointments. The patient expressed understanding and agreed to proceed    I discussed the assessment and treatment plan with the patient. The patient was provided an opportunity to ask questions and all were answered. The patient agreed with the plan and demonstrated an understanding of the instructions.   The patient was advised to call back or seek an in-person evaluation if the symptoms worsen or if the condition fails to improve as anticipated.  I provided 15 minutes of non-face-to-face time during this encounter.   [name], MD  [facility] MD/PA/NP OP Progress Note  05/18/2020 3:16 PM April Ward  MRN:  [id number]  Chief Complaint:  Chief Complaint    Anxiety; Depression; Follow-up     HPI: This patient is a 60 year old widowed white female lives with her son in [place].  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  The patient returns for follow-up after 3 months.  She states that she is doing about the same.  She still has not received the Wellbutrin that I ordered last time but states that the online pharmacy is now going to be shipping it.  She does not feel as well without it but is getting by.  She is exercising outside when she can.  She is still waiting for kidney transplant.  She denies significant depression but her energy was much better with the Wellbutrin she denies thoughts of suicidal ideation in the prazosin has helped prevent nightmares.  She is sleeping fairly well most of the time Visit Diagnosis:    ICD-10-CM   1. Major depressive disorder, single episode, severe without psychotic  features ([place])  F32.2     Past Psychiatric History: Long-term outpatient treatment  Past Medical History:  Past Medical History:  Diagnosis Date  . Anxiety   . Arthritis   . Depression   . Diabetes mellitus without complication ([place])   . GERD (gastroesophageal reflux disease)   . Gout   . Heart murmur   . Hypertension   . Renal cancer ([place])   . Renal disease 10/2012  . Renal insufficiency     Past Surgical History:  Procedure Laterality Date  . CESAREAN SECTION  [id number]  . CHONDROPLASTY Right 08/22/2012   Procedure: CHONDROPLASTY;  Surgeon: Carole Civil, MD;  Location: [facility];  Service: Orthopedics;  Laterality: Right;  . COLONOSCOPY WITH PROPOFOL N/A 03/16/2014   Procedure: COLONOSCOPY WITH PROPOFOL (at cecum 1023, total withdrawal time=9 minutes);  Surgeon: Danie Binder, MD;  Location: [facility];  Service: Endoscopy;  Laterality: N/A;  . GASTRIC BYPASS    . KNEE ARTHROSCOPY WITH MEDIAL MENISECTOMY Right 08/22/2012   Procedure: KNEE ARTHROSCOPY WITH PARTIAL MEDIAL MENISECTOMY;  Surgeon: Carole Civil, MD;  Location: [facility];  Service: Orthopedics;  Laterality: Right;  . PARTIAL NEPHRECTOMY  Dec 2014   left  . TENDON REPAIR      Family Psychiatric History: see below  Family History:  Family History  Problem Relation Age of Onset  . Heart attack Mother   . Heart attack Father   . Depression Paternal Aunt   . Alcohol  abuse Maternal Uncle   . Colon cancer Maternal Aunt   . Colon cancer Maternal Uncle     Social History:  Social History   Socioeconomic History  . Marital status: Widowed    Spouse name: Not on file  . Number of children: Not on file  . Years of education: Not on file  . Highest education level: Not on file  Occupational History  . Occupation: Visual merchandiser: [employer]  Tobacco Use  . Smoking status: Never Smoker  . Smokeless tobacco: Never Used  Substance and Sexual Activity  . Alcohol use: No     Alcohol/week: 0.0 standard drinks  . Drug use: No  . Sexual activity: Not on file  Other Topics Concern  . Not on file  Social History Narrative  . Not on file   Social Determinants of Health   Financial Resource Strain: Not on file  Food Insecurity: Not on file  Transportation Needs: Not on file  Physical Activity: Not on file  Stress: Not on file  Social Connections: Not on file    Allergies:  Allergies  Allergen Reactions  . Skelaxin [Metaxalone] Hives and Rash    Metabolic Disorder Labs: No results found for: HGBA1C, MPG No results found for: PROLACTIN Lab Results  Component Value Date   CHOL 215 (H) 03/11/2014   TRIG 288 (H) 03/11/2014   HDL 33 (L) 03/11/2014   CHOLHDL 6.5 03/11/2014   VLDL 58 (H) 03/11/2014   LDLCALC 124 (H) 03/11/2014   No results found for: TSH  Therapeutic Level Labs: No results found for: LITHIUM No results found for: VALPROATE No components found for:  CBMZ  Current Medications: Current Outpatient Medications  Medication Sig Dispense Refill  . ALPRAZolam (XANAX) 1 MG tablet Take 1 tablet (1 mg total) by mouth daily as needed for anxiety. 90 tablet 2  . amLODipine (NORVASC) 10 MG tablet Take 1 tablet by mouth daily.    Marland Kitchen buPROPion (WELLBUTRIN XL) 300 MG 24 hr tablet Take 1 tablet (300 mg total) by mouth every morning. 30 tablet 2  . calcitRIOL (ROCALTROL) 0.25 MCG capsule Take 1 capsule by mouth. 6 times weekly - Monday thru Saturday    . hydrALAZINE (APRESOLINE) 100 MG tablet TAKE 1 TABLET THREE TIMES DAILY 270 tablet 1  . labetalol (NORMODYNE) 200 MG tablet Take 1 tablet (200 mg total) by mouth 2 (two) times daily. 60 tablet 2  . lactulose (CONSTULOSE) 10 GM/15ML solution Take 10 mLs by mouth 2 (two) times daily as needed for constipation.    Marland Kitchen levothyroxine (SYNTHROID, LEVOTHROID) 200 MCG tablet Take 1 tablet by mouth daily. X 6 days of the week & 331mcg on one day of the week    . losartan (COZAAR) 100 MG tablet Take 1 tablet (100  mg total) by mouth daily. 90 tablet 3  . meclizine (ANTIVERT) 25 MG tablet Take 1 tablet (25 mg total) by mouth every 8 (eight) hours as needed for dizziness. 90 tablet 0  . multivitamin (RENA-VIT) TABS tablet Take 1 tablet by mouth daily.    . prazosin (MINIPRESS) 5 MG capsule Take 1 capsule (5 mg total) by mouth at bedtime. 90 capsule 2  . sertraline (ZOLOFT) 100 MG tablet Take 1 tablet (100 mg total) by mouth daily. 90 tablet 2  . sevelamer carbonate (RENVELA) 800 MG tablet Take 800 mg by mouth 3 (three) times daily. Does 400mg  twice a day with snacks    . Thiamine HCl (  VITAMIN B1 PO) Take 1 tablet by mouth 3 (three) times a week.    Marland Kitchen tiZANidine (ZANAFLEX) 4 MG tablet TAKE 1 TABLET BY MOUTH EVERY DAY 30 tablet 1  . vitamin B-12 (CYANOCOBALAMIN) 500 MCG tablet Take 500 mcg by mouth daily.     No current facility-administered medications for this visit.     Musculoskeletal: Strength & Muscle Tone: within normal limits Gait & Station: normal Patient leans: N/A  Psychiatric Specialty Exam: Review of Systems  Constitutional: Positive for fatigue.  All other systems reviewed and are negative.   There were no vitals taken for this visit.There is no height or weight on file to calculate BMI.  General Appearance: Casual and Fairly Groomed  Eye Contact:  Good  Speech:  Clear and Coherent  Volume:  Normal  Mood:  Euthymic  Affect:  Appropriate and Congruent  Thought Process:  Goal Directed  Orientation:  Full (Time, Place, and Person)  Thought Content: Rumination   Suicidal Thoughts:  No  Homicidal Thoughts:  No  Memory:  Immediate;   Good Recent;   Good Remote;   Good  Judgement:  Good  Insight:  Good  Psychomotor Activity:  Normal  Concentration:  Concentration: Good and Attention Span: Good  Recall:  Good  Fund of Knowledge: Good  Language: Good  Akathisia:  No  Handed:  Left  AIMS (if indicated): not done  Assets:  Communication Skills Desire for  Improvement Resilience Social Support Talents/Skills  ADL's:  Intact  Cognition: WNL  Sleep:  Good   Screenings:   Assessment and Plan: This patient is a 60 year old female with a history of end-stage renal disease depression and anxiety.  Hopefully she will get the Wellbutrin XL 300 mg to take daily which will help her energy.  She will also continue Zoloft 100 mg daily for depression, prazosin 5 mg at bedtime to prevent nightmares and Xanax 1 mg daily as needed for anxiety.  She will return to see me in 3 months   [name], MD 05/18/2020, 3:16 PM

## 2020-05-19 DIAGNOSIS — N186 End stage renal disease: Secondary | ICD-10-CM | POA: Diagnosis not present

## 2020-05-19 DIAGNOSIS — N2581 Secondary hyperparathyroidism of renal origin: Secondary | ICD-10-CM | POA: Diagnosis not present

## 2020-05-19 DIAGNOSIS — D509 Iron deficiency anemia, unspecified: Secondary | ICD-10-CM | POA: Diagnosis not present

## 2020-05-19 DIAGNOSIS — D631 Anemia in chronic kidney disease: Secondary | ICD-10-CM | POA: Diagnosis not present

## 2020-05-20 DIAGNOSIS — D631 Anemia in chronic kidney disease: Secondary | ICD-10-CM | POA: Diagnosis not present

## 2020-05-20 DIAGNOSIS — N186 End stage renal disease: Secondary | ICD-10-CM | POA: Diagnosis not present

## 2020-05-20 DIAGNOSIS — D509 Iron deficiency anemia, unspecified: Secondary | ICD-10-CM | POA: Diagnosis not present

## 2020-05-20 DIAGNOSIS — N2581 Secondary hyperparathyroidism of renal origin: Secondary | ICD-10-CM | POA: Diagnosis not present

## 2020-05-21 DIAGNOSIS — N186 End stage renal disease: Secondary | ICD-10-CM | POA: Diagnosis not present

## 2020-05-21 DIAGNOSIS — D509 Iron deficiency anemia, unspecified: Secondary | ICD-10-CM | POA: Diagnosis not present

## 2020-05-21 DIAGNOSIS — N2581 Secondary hyperparathyroidism of renal origin: Secondary | ICD-10-CM | POA: Diagnosis not present

## 2020-05-21 DIAGNOSIS — D631 Anemia in chronic kidney disease: Secondary | ICD-10-CM | POA: Diagnosis not present

## 2020-05-22 DIAGNOSIS — D509 Iron deficiency anemia, unspecified: Secondary | ICD-10-CM | POA: Diagnosis not present

## 2020-05-22 DIAGNOSIS — N186 End stage renal disease: Secondary | ICD-10-CM | POA: Diagnosis not present

## 2020-05-22 DIAGNOSIS — D631 Anemia in chronic kidney disease: Secondary | ICD-10-CM | POA: Diagnosis not present

## 2020-05-22 DIAGNOSIS — N2581 Secondary hyperparathyroidism of renal origin: Secondary | ICD-10-CM | POA: Diagnosis not present

## 2020-05-23 DIAGNOSIS — N186 End stage renal disease: Secondary | ICD-10-CM | POA: Diagnosis not present

## 2020-05-23 DIAGNOSIS — D509 Iron deficiency anemia, unspecified: Secondary | ICD-10-CM | POA: Diagnosis not present

## 2020-05-23 DIAGNOSIS — N2581 Secondary hyperparathyroidism of renal origin: Secondary | ICD-10-CM | POA: Diagnosis not present

## 2020-05-23 DIAGNOSIS — D631 Anemia in chronic kidney disease: Secondary | ICD-10-CM | POA: Diagnosis not present

## 2020-05-24 DIAGNOSIS — D509 Iron deficiency anemia, unspecified: Secondary | ICD-10-CM | POA: Diagnosis not present

## 2020-05-24 DIAGNOSIS — N2581 Secondary hyperparathyroidism of renal origin: Secondary | ICD-10-CM | POA: Diagnosis not present

## 2020-05-24 DIAGNOSIS — D631 Anemia in chronic kidney disease: Secondary | ICD-10-CM | POA: Diagnosis not present

## 2020-05-24 DIAGNOSIS — N186 End stage renal disease: Secondary | ICD-10-CM | POA: Diagnosis not present

## 2020-05-25 DIAGNOSIS — N2581 Secondary hyperparathyroidism of renal origin: Secondary | ICD-10-CM | POA: Diagnosis not present

## 2020-05-25 DIAGNOSIS — N186 End stage renal disease: Secondary | ICD-10-CM | POA: Diagnosis not present

## 2020-05-25 DIAGNOSIS — D631 Anemia in chronic kidney disease: Secondary | ICD-10-CM | POA: Diagnosis not present

## 2020-05-25 DIAGNOSIS — D509 Iron deficiency anemia, unspecified: Secondary | ICD-10-CM | POA: Diagnosis not present

## 2020-05-26 DIAGNOSIS — N186 End stage renal disease: Secondary | ICD-10-CM | POA: Diagnosis not present

## 2020-05-26 DIAGNOSIS — D631 Anemia in chronic kidney disease: Secondary | ICD-10-CM | POA: Diagnosis not present

## 2020-05-26 DIAGNOSIS — D509 Iron deficiency anemia, unspecified: Secondary | ICD-10-CM | POA: Diagnosis not present

## 2020-05-26 DIAGNOSIS — N2581 Secondary hyperparathyroidism of renal origin: Secondary | ICD-10-CM | POA: Diagnosis not present

## 2020-05-27 DIAGNOSIS — D631 Anemia in chronic kidney disease: Secondary | ICD-10-CM | POA: Diagnosis not present

## 2020-05-27 DIAGNOSIS — N186 End stage renal disease: Secondary | ICD-10-CM | POA: Diagnosis not present

## 2020-05-27 DIAGNOSIS — N2581 Secondary hyperparathyroidism of renal origin: Secondary | ICD-10-CM | POA: Diagnosis not present

## 2020-05-27 DIAGNOSIS — D509 Iron deficiency anemia, unspecified: Secondary | ICD-10-CM | POA: Diagnosis not present

## 2020-05-28 DIAGNOSIS — N2581 Secondary hyperparathyroidism of renal origin: Secondary | ICD-10-CM | POA: Diagnosis not present

## 2020-05-28 DIAGNOSIS — N186 End stage renal disease: Secondary | ICD-10-CM | POA: Diagnosis not present

## 2020-05-28 DIAGNOSIS — D631 Anemia in chronic kidney disease: Secondary | ICD-10-CM | POA: Diagnosis not present

## 2020-05-28 DIAGNOSIS — D509 Iron deficiency anemia, unspecified: Secondary | ICD-10-CM | POA: Diagnosis not present

## 2020-05-29 DIAGNOSIS — D509 Iron deficiency anemia, unspecified: Secondary | ICD-10-CM | POA: Diagnosis not present

## 2020-05-29 DIAGNOSIS — N186 End stage renal disease: Secondary | ICD-10-CM | POA: Diagnosis not present

## 2020-05-29 DIAGNOSIS — D631 Anemia in chronic kidney disease: Secondary | ICD-10-CM | POA: Diagnosis not present

## 2020-05-29 DIAGNOSIS — N2581 Secondary hyperparathyroidism of renal origin: Secondary | ICD-10-CM | POA: Diagnosis not present

## 2020-05-30 DIAGNOSIS — D509 Iron deficiency anemia, unspecified: Secondary | ICD-10-CM | POA: Diagnosis not present

## 2020-05-30 DIAGNOSIS — N2581 Secondary hyperparathyroidism of renal origin: Secondary | ICD-10-CM | POA: Diagnosis not present

## 2020-05-30 DIAGNOSIS — D631 Anemia in chronic kidney disease: Secondary | ICD-10-CM | POA: Diagnosis not present

## 2020-05-30 DIAGNOSIS — N186 End stage renal disease: Secondary | ICD-10-CM | POA: Diagnosis not present

## 2020-05-30 DIAGNOSIS — Z992 Dependence on renal dialysis: Secondary | ICD-10-CM | POA: Diagnosis not present

## 2020-05-31 DIAGNOSIS — N2581 Secondary hyperparathyroidism of renal origin: Secondary | ICD-10-CM | POA: Diagnosis not present

## 2020-05-31 DIAGNOSIS — N186 End stage renal disease: Secondary | ICD-10-CM | POA: Diagnosis not present

## 2020-05-31 DIAGNOSIS — D631 Anemia in chronic kidney disease: Secondary | ICD-10-CM | POA: Diagnosis not present

## 2020-05-31 DIAGNOSIS — D509 Iron deficiency anemia, unspecified: Secondary | ICD-10-CM | POA: Diagnosis not present

## 2020-06-01 DIAGNOSIS — N2581 Secondary hyperparathyroidism of renal origin: Secondary | ICD-10-CM | POA: Diagnosis not present

## 2020-06-01 DIAGNOSIS — N186 End stage renal disease: Secondary | ICD-10-CM | POA: Diagnosis not present

## 2020-06-01 DIAGNOSIS — D509 Iron deficiency anemia, unspecified: Secondary | ICD-10-CM | POA: Diagnosis not present

## 2020-06-01 DIAGNOSIS — D631 Anemia in chronic kidney disease: Secondary | ICD-10-CM | POA: Diagnosis not present

## 2020-06-02 DIAGNOSIS — Z7682 Awaiting organ transplant status: Secondary | ICD-10-CM | POA: Diagnosis not present

## 2020-06-02 DIAGNOSIS — D509 Iron deficiency anemia, unspecified: Secondary | ICD-10-CM | POA: Diagnosis not present

## 2020-06-02 DIAGNOSIS — Z9884 Bariatric surgery status: Secondary | ICD-10-CM | POA: Diagnosis not present

## 2020-06-02 DIAGNOSIS — I10 Essential (primary) hypertension: Secondary | ICD-10-CM | POA: Diagnosis not present

## 2020-06-02 DIAGNOSIS — D631 Anemia in chronic kidney disease: Secondary | ICD-10-CM | POA: Diagnosis not present

## 2020-06-02 DIAGNOSIS — N186 End stage renal disease: Secondary | ICD-10-CM | POA: Diagnosis not present

## 2020-06-02 DIAGNOSIS — N2581 Secondary hyperparathyroidism of renal origin: Secondary | ICD-10-CM | POA: Diagnosis not present

## 2020-06-03 DIAGNOSIS — D509 Iron deficiency anemia, unspecified: Secondary | ICD-10-CM | POA: Diagnosis not present

## 2020-06-03 DIAGNOSIS — N2581 Secondary hyperparathyroidism of renal origin: Secondary | ICD-10-CM | POA: Diagnosis not present

## 2020-06-03 DIAGNOSIS — N186 End stage renal disease: Secondary | ICD-10-CM | POA: Diagnosis not present

## 2020-06-03 DIAGNOSIS — D631 Anemia in chronic kidney disease: Secondary | ICD-10-CM | POA: Diagnosis not present

## 2020-06-04 DIAGNOSIS — D631 Anemia in chronic kidney disease: Secondary | ICD-10-CM | POA: Diagnosis not present

## 2020-06-04 DIAGNOSIS — N186 End stage renal disease: Secondary | ICD-10-CM | POA: Diagnosis not present

## 2020-06-04 DIAGNOSIS — D509 Iron deficiency anemia, unspecified: Secondary | ICD-10-CM | POA: Diagnosis not present

## 2020-06-04 DIAGNOSIS — N2581 Secondary hyperparathyroidism of renal origin: Secondary | ICD-10-CM | POA: Diagnosis not present

## 2020-06-05 DIAGNOSIS — N2581 Secondary hyperparathyroidism of renal origin: Secondary | ICD-10-CM | POA: Diagnosis not present

## 2020-06-05 DIAGNOSIS — D509 Iron deficiency anemia, unspecified: Secondary | ICD-10-CM | POA: Diagnosis not present

## 2020-06-05 DIAGNOSIS — D631 Anemia in chronic kidney disease: Secondary | ICD-10-CM | POA: Diagnosis not present

## 2020-06-05 DIAGNOSIS — N186 End stage renal disease: Secondary | ICD-10-CM | POA: Diagnosis not present

## 2020-06-06 DIAGNOSIS — N2581 Secondary hyperparathyroidism of renal origin: Secondary | ICD-10-CM | POA: Diagnosis not present

## 2020-06-06 DIAGNOSIS — N186 End stage renal disease: Secondary | ICD-10-CM | POA: Diagnosis not present

## 2020-06-06 DIAGNOSIS — D509 Iron deficiency anemia, unspecified: Secondary | ICD-10-CM | POA: Diagnosis not present

## 2020-06-06 DIAGNOSIS — D631 Anemia in chronic kidney disease: Secondary | ICD-10-CM | POA: Diagnosis not present

## 2020-06-07 DIAGNOSIS — N2581 Secondary hyperparathyroidism of renal origin: Secondary | ICD-10-CM | POA: Diagnosis not present

## 2020-06-07 DIAGNOSIS — N186 End stage renal disease: Secondary | ICD-10-CM | POA: Diagnosis not present

## 2020-06-07 DIAGNOSIS — D631 Anemia in chronic kidney disease: Secondary | ICD-10-CM | POA: Diagnosis not present

## 2020-06-07 DIAGNOSIS — D509 Iron deficiency anemia, unspecified: Secondary | ICD-10-CM | POA: Diagnosis not present

## 2020-06-08 DIAGNOSIS — N2581 Secondary hyperparathyroidism of renal origin: Secondary | ICD-10-CM | POA: Diagnosis not present

## 2020-06-08 DIAGNOSIS — N186 End stage renal disease: Secondary | ICD-10-CM | POA: Diagnosis not present

## 2020-06-08 DIAGNOSIS — D509 Iron deficiency anemia, unspecified: Secondary | ICD-10-CM | POA: Diagnosis not present

## 2020-06-08 DIAGNOSIS — D631 Anemia in chronic kidney disease: Secondary | ICD-10-CM | POA: Diagnosis not present

## 2020-06-09 DIAGNOSIS — N2581 Secondary hyperparathyroidism of renal origin: Secondary | ICD-10-CM | POA: Diagnosis not present

## 2020-06-09 DIAGNOSIS — D509 Iron deficiency anemia, unspecified: Secondary | ICD-10-CM | POA: Diagnosis not present

## 2020-06-09 DIAGNOSIS — D631 Anemia in chronic kidney disease: Secondary | ICD-10-CM | POA: Diagnosis not present

## 2020-06-09 DIAGNOSIS — N186 End stage renal disease: Secondary | ICD-10-CM | POA: Diagnosis not present

## 2020-06-10 DIAGNOSIS — N186 End stage renal disease: Secondary | ICD-10-CM | POA: Diagnosis not present

## 2020-06-10 DIAGNOSIS — D631 Anemia in chronic kidney disease: Secondary | ICD-10-CM | POA: Diagnosis not present

## 2020-06-10 DIAGNOSIS — N2581 Secondary hyperparathyroidism of renal origin: Secondary | ICD-10-CM | POA: Diagnosis not present

## 2020-06-10 DIAGNOSIS — D509 Iron deficiency anemia, unspecified: Secondary | ICD-10-CM | POA: Diagnosis not present

## 2020-06-11 DIAGNOSIS — N186 End stage renal disease: Secondary | ICD-10-CM | POA: Diagnosis not present

## 2020-06-11 DIAGNOSIS — D509 Iron deficiency anemia, unspecified: Secondary | ICD-10-CM | POA: Diagnosis not present

## 2020-06-11 DIAGNOSIS — N2581 Secondary hyperparathyroidism of renal origin: Secondary | ICD-10-CM | POA: Diagnosis not present

## 2020-06-11 DIAGNOSIS — D631 Anemia in chronic kidney disease: Secondary | ICD-10-CM | POA: Diagnosis not present

## 2020-06-12 DIAGNOSIS — N186 End stage renal disease: Secondary | ICD-10-CM | POA: Diagnosis not present

## 2020-06-12 DIAGNOSIS — D509 Iron deficiency anemia, unspecified: Secondary | ICD-10-CM | POA: Diagnosis not present

## 2020-06-12 DIAGNOSIS — N2581 Secondary hyperparathyroidism of renal origin: Secondary | ICD-10-CM | POA: Diagnosis not present

## 2020-06-12 DIAGNOSIS — D631 Anemia in chronic kidney disease: Secondary | ICD-10-CM | POA: Diagnosis not present

## 2020-06-13 DIAGNOSIS — D509 Iron deficiency anemia, unspecified: Secondary | ICD-10-CM | POA: Diagnosis not present

## 2020-06-13 DIAGNOSIS — N2581 Secondary hyperparathyroidism of renal origin: Secondary | ICD-10-CM | POA: Diagnosis not present

## 2020-06-13 DIAGNOSIS — D631 Anemia in chronic kidney disease: Secondary | ICD-10-CM | POA: Diagnosis not present

## 2020-06-13 DIAGNOSIS — N186 End stage renal disease: Secondary | ICD-10-CM | POA: Diagnosis not present

## 2020-06-14 DIAGNOSIS — N2581 Secondary hyperparathyroidism of renal origin: Secondary | ICD-10-CM | POA: Diagnosis not present

## 2020-06-14 DIAGNOSIS — D509 Iron deficiency anemia, unspecified: Secondary | ICD-10-CM | POA: Diagnosis not present

## 2020-06-14 DIAGNOSIS — N186 End stage renal disease: Secondary | ICD-10-CM | POA: Diagnosis not present

## 2020-06-14 DIAGNOSIS — D631 Anemia in chronic kidney disease: Secondary | ICD-10-CM | POA: Diagnosis not present

## 2020-06-15 ENCOUNTER — Other Ambulatory Visit (HOSPITAL_COMMUNITY): Payer: Self-pay | Admitting: Psychiatry

## 2020-06-15 ENCOUNTER — Telehealth (HOSPITAL_COMMUNITY): Payer: Self-pay | Admitting: Psychiatry

## 2020-06-15 DIAGNOSIS — N186 End stage renal disease: Secondary | ICD-10-CM | POA: Diagnosis not present

## 2020-06-15 DIAGNOSIS — D509 Iron deficiency anemia, unspecified: Secondary | ICD-10-CM | POA: Diagnosis not present

## 2020-06-15 DIAGNOSIS — D631 Anemia in chronic kidney disease: Secondary | ICD-10-CM | POA: Diagnosis not present

## 2020-06-15 DIAGNOSIS — N2581 Secondary hyperparathyroidism of renal origin: Secondary | ICD-10-CM | POA: Diagnosis not present

## 2020-06-15 NOTE — Telephone Encounter (Signed)
Called to schedule f/u appt left vm 

## 2020-06-16 DIAGNOSIS — D631 Anemia in chronic kidney disease: Secondary | ICD-10-CM | POA: Diagnosis not present

## 2020-06-16 DIAGNOSIS — D509 Iron deficiency anemia, unspecified: Secondary | ICD-10-CM | POA: Diagnosis not present

## 2020-06-16 DIAGNOSIS — N186 End stage renal disease: Secondary | ICD-10-CM | POA: Diagnosis not present

## 2020-06-16 DIAGNOSIS — N2581 Secondary hyperparathyroidism of renal origin: Secondary | ICD-10-CM | POA: Diagnosis not present

## 2020-06-17 DIAGNOSIS — D631 Anemia in chronic kidney disease: Secondary | ICD-10-CM | POA: Diagnosis not present

## 2020-06-17 DIAGNOSIS — N186 End stage renal disease: Secondary | ICD-10-CM | POA: Diagnosis not present

## 2020-06-17 DIAGNOSIS — D509 Iron deficiency anemia, unspecified: Secondary | ICD-10-CM | POA: Diagnosis not present

## 2020-06-17 DIAGNOSIS — N2581 Secondary hyperparathyroidism of renal origin: Secondary | ICD-10-CM | POA: Diagnosis not present

## 2020-06-18 DIAGNOSIS — N186 End stage renal disease: Secondary | ICD-10-CM | POA: Diagnosis not present

## 2020-06-18 DIAGNOSIS — D631 Anemia in chronic kidney disease: Secondary | ICD-10-CM | POA: Diagnosis not present

## 2020-06-18 DIAGNOSIS — N2581 Secondary hyperparathyroidism of renal origin: Secondary | ICD-10-CM | POA: Diagnosis not present

## 2020-06-18 DIAGNOSIS — D509 Iron deficiency anemia, unspecified: Secondary | ICD-10-CM | POA: Diagnosis not present

## 2020-06-19 DIAGNOSIS — N186 End stage renal disease: Secondary | ICD-10-CM | POA: Diagnosis not present

## 2020-06-19 DIAGNOSIS — D631 Anemia in chronic kidney disease: Secondary | ICD-10-CM | POA: Diagnosis not present

## 2020-06-19 DIAGNOSIS — N2581 Secondary hyperparathyroidism of renal origin: Secondary | ICD-10-CM | POA: Diagnosis not present

## 2020-06-19 DIAGNOSIS — D509 Iron deficiency anemia, unspecified: Secondary | ICD-10-CM | POA: Diagnosis not present

## 2020-06-20 DIAGNOSIS — D509 Iron deficiency anemia, unspecified: Secondary | ICD-10-CM | POA: Diagnosis not present

## 2020-06-20 DIAGNOSIS — D631 Anemia in chronic kidney disease: Secondary | ICD-10-CM | POA: Diagnosis not present

## 2020-06-20 DIAGNOSIS — N186 End stage renal disease: Secondary | ICD-10-CM | POA: Diagnosis not present

## 2020-06-20 DIAGNOSIS — N2581 Secondary hyperparathyroidism of renal origin: Secondary | ICD-10-CM | POA: Diagnosis not present

## 2020-06-21 DIAGNOSIS — D631 Anemia in chronic kidney disease: Secondary | ICD-10-CM | POA: Diagnosis not present

## 2020-06-21 DIAGNOSIS — D509 Iron deficiency anemia, unspecified: Secondary | ICD-10-CM | POA: Diagnosis not present

## 2020-06-21 DIAGNOSIS — N186 End stage renal disease: Secondary | ICD-10-CM | POA: Diagnosis not present

## 2020-06-21 DIAGNOSIS — N2581 Secondary hyperparathyroidism of renal origin: Secondary | ICD-10-CM | POA: Diagnosis not present

## 2020-06-22 DIAGNOSIS — N186 End stage renal disease: Secondary | ICD-10-CM | POA: Diagnosis not present

## 2020-06-22 DIAGNOSIS — N2581 Secondary hyperparathyroidism of renal origin: Secondary | ICD-10-CM | POA: Diagnosis not present

## 2020-06-22 DIAGNOSIS — D631 Anemia in chronic kidney disease: Secondary | ICD-10-CM | POA: Diagnosis not present

## 2020-06-22 DIAGNOSIS — D509 Iron deficiency anemia, unspecified: Secondary | ICD-10-CM | POA: Diagnosis not present

## 2020-06-23 DIAGNOSIS — N2581 Secondary hyperparathyroidism of renal origin: Secondary | ICD-10-CM | POA: Diagnosis not present

## 2020-06-23 DIAGNOSIS — D509 Iron deficiency anemia, unspecified: Secondary | ICD-10-CM | POA: Diagnosis not present

## 2020-06-23 DIAGNOSIS — D631 Anemia in chronic kidney disease: Secondary | ICD-10-CM | POA: Diagnosis not present

## 2020-06-23 DIAGNOSIS — N186 End stage renal disease: Secondary | ICD-10-CM | POA: Diagnosis not present

## 2020-06-24 DIAGNOSIS — D509 Iron deficiency anemia, unspecified: Secondary | ICD-10-CM | POA: Diagnosis not present

## 2020-06-24 DIAGNOSIS — D631 Anemia in chronic kidney disease: Secondary | ICD-10-CM | POA: Diagnosis not present

## 2020-06-24 DIAGNOSIS — N186 End stage renal disease: Secondary | ICD-10-CM | POA: Diagnosis not present

## 2020-06-24 DIAGNOSIS — N2581 Secondary hyperparathyroidism of renal origin: Secondary | ICD-10-CM | POA: Diagnosis not present

## 2020-06-25 DIAGNOSIS — D509 Iron deficiency anemia, unspecified: Secondary | ICD-10-CM | POA: Diagnosis not present

## 2020-06-25 DIAGNOSIS — N186 End stage renal disease: Secondary | ICD-10-CM | POA: Diagnosis not present

## 2020-06-25 DIAGNOSIS — N2581 Secondary hyperparathyroidism of renal origin: Secondary | ICD-10-CM | POA: Diagnosis not present

## 2020-06-25 DIAGNOSIS — D631 Anemia in chronic kidney disease: Secondary | ICD-10-CM | POA: Diagnosis not present

## 2020-06-26 DIAGNOSIS — D631 Anemia in chronic kidney disease: Secondary | ICD-10-CM | POA: Diagnosis not present

## 2020-06-26 DIAGNOSIS — N186 End stage renal disease: Secondary | ICD-10-CM | POA: Diagnosis not present

## 2020-06-26 DIAGNOSIS — N2581 Secondary hyperparathyroidism of renal origin: Secondary | ICD-10-CM | POA: Diagnosis not present

## 2020-06-26 DIAGNOSIS — D509 Iron deficiency anemia, unspecified: Secondary | ICD-10-CM | POA: Diagnosis not present

## 2020-06-27 DIAGNOSIS — D631 Anemia in chronic kidney disease: Secondary | ICD-10-CM | POA: Diagnosis not present

## 2020-06-27 DIAGNOSIS — N2581 Secondary hyperparathyroidism of renal origin: Secondary | ICD-10-CM | POA: Diagnosis not present

## 2020-06-27 DIAGNOSIS — N186 End stage renal disease: Secondary | ICD-10-CM | POA: Diagnosis not present

## 2020-06-27 DIAGNOSIS — D509 Iron deficiency anemia, unspecified: Secondary | ICD-10-CM | POA: Diagnosis not present

## 2020-06-28 DIAGNOSIS — N2581 Secondary hyperparathyroidism of renal origin: Secondary | ICD-10-CM | POA: Diagnosis not present

## 2020-06-28 DIAGNOSIS — D509 Iron deficiency anemia, unspecified: Secondary | ICD-10-CM | POA: Diagnosis not present

## 2020-06-28 DIAGNOSIS — N186 End stage renal disease: Secondary | ICD-10-CM | POA: Diagnosis not present

## 2020-06-28 DIAGNOSIS — D631 Anemia in chronic kidney disease: Secondary | ICD-10-CM | POA: Diagnosis not present

## 2020-06-29 DIAGNOSIS — D631 Anemia in chronic kidney disease: Secondary | ICD-10-CM | POA: Diagnosis not present

## 2020-06-29 DIAGNOSIS — N2581 Secondary hyperparathyroidism of renal origin: Secondary | ICD-10-CM | POA: Diagnosis not present

## 2020-06-29 DIAGNOSIS — D509 Iron deficiency anemia, unspecified: Secondary | ICD-10-CM | POA: Diagnosis not present

## 2020-06-29 DIAGNOSIS — N186 End stage renal disease: Secondary | ICD-10-CM | POA: Diagnosis not present

## 2020-06-30 DIAGNOSIS — N186 End stage renal disease: Secondary | ICD-10-CM | POA: Diagnosis not present

## 2020-06-30 DIAGNOSIS — D631 Anemia in chronic kidney disease: Secondary | ICD-10-CM | POA: Diagnosis not present

## 2020-06-30 DIAGNOSIS — N2581 Secondary hyperparathyroidism of renal origin: Secondary | ICD-10-CM | POA: Diagnosis not present

## 2020-06-30 DIAGNOSIS — D509 Iron deficiency anemia, unspecified: Secondary | ICD-10-CM | POA: Diagnosis not present

## 2020-06-30 DIAGNOSIS — Z992 Dependence on renal dialysis: Secondary | ICD-10-CM | POA: Diagnosis not present

## 2020-07-01 DIAGNOSIS — D631 Anemia in chronic kidney disease: Secondary | ICD-10-CM | POA: Diagnosis not present

## 2020-07-01 DIAGNOSIS — D509 Iron deficiency anemia, unspecified: Secondary | ICD-10-CM | POA: Diagnosis not present

## 2020-07-01 DIAGNOSIS — N186 End stage renal disease: Secondary | ICD-10-CM | POA: Diagnosis not present

## 2020-07-01 DIAGNOSIS — N2581 Secondary hyperparathyroidism of renal origin: Secondary | ICD-10-CM | POA: Diagnosis not present

## 2020-07-02 DIAGNOSIS — N2581 Secondary hyperparathyroidism of renal origin: Secondary | ICD-10-CM | POA: Diagnosis not present

## 2020-07-02 DIAGNOSIS — D631 Anemia in chronic kidney disease: Secondary | ICD-10-CM | POA: Diagnosis not present

## 2020-07-02 DIAGNOSIS — D509 Iron deficiency anemia, unspecified: Secondary | ICD-10-CM | POA: Diagnosis not present

## 2020-07-02 DIAGNOSIS — N186 End stage renal disease: Secondary | ICD-10-CM | POA: Diagnosis not present

## 2020-07-03 DIAGNOSIS — D631 Anemia in chronic kidney disease: Secondary | ICD-10-CM | POA: Diagnosis not present

## 2020-07-03 DIAGNOSIS — N186 End stage renal disease: Secondary | ICD-10-CM | POA: Diagnosis not present

## 2020-07-03 DIAGNOSIS — D509 Iron deficiency anemia, unspecified: Secondary | ICD-10-CM | POA: Diagnosis not present

## 2020-07-03 DIAGNOSIS — N2581 Secondary hyperparathyroidism of renal origin: Secondary | ICD-10-CM | POA: Diagnosis not present

## 2020-07-04 DIAGNOSIS — N186 End stage renal disease: Secondary | ICD-10-CM | POA: Diagnosis not present

## 2020-07-04 DIAGNOSIS — N2581 Secondary hyperparathyroidism of renal origin: Secondary | ICD-10-CM | POA: Diagnosis not present

## 2020-07-04 DIAGNOSIS — D631 Anemia in chronic kidney disease: Secondary | ICD-10-CM | POA: Diagnosis not present

## 2020-07-04 DIAGNOSIS — D509 Iron deficiency anemia, unspecified: Secondary | ICD-10-CM | POA: Diagnosis not present

## 2020-07-05 DIAGNOSIS — D631 Anemia in chronic kidney disease: Secondary | ICD-10-CM | POA: Diagnosis not present

## 2020-07-05 DIAGNOSIS — N186 End stage renal disease: Secondary | ICD-10-CM | POA: Diagnosis not present

## 2020-07-05 DIAGNOSIS — N2581 Secondary hyperparathyroidism of renal origin: Secondary | ICD-10-CM | POA: Diagnosis not present

## 2020-07-05 DIAGNOSIS — D509 Iron deficiency anemia, unspecified: Secondary | ICD-10-CM | POA: Diagnosis not present

## 2020-07-06 DIAGNOSIS — N186 End stage renal disease: Secondary | ICD-10-CM | POA: Diagnosis not present

## 2020-07-06 DIAGNOSIS — D509 Iron deficiency anemia, unspecified: Secondary | ICD-10-CM | POA: Diagnosis not present

## 2020-07-06 DIAGNOSIS — N2581 Secondary hyperparathyroidism of renal origin: Secondary | ICD-10-CM | POA: Diagnosis not present

## 2020-07-06 DIAGNOSIS — D631 Anemia in chronic kidney disease: Secondary | ICD-10-CM | POA: Diagnosis not present

## 2020-07-07 DIAGNOSIS — N186 End stage renal disease: Secondary | ICD-10-CM | POA: Diagnosis not present

## 2020-07-07 DIAGNOSIS — N2581 Secondary hyperparathyroidism of renal origin: Secondary | ICD-10-CM | POA: Diagnosis not present

## 2020-07-07 DIAGNOSIS — D631 Anemia in chronic kidney disease: Secondary | ICD-10-CM | POA: Diagnosis not present

## 2020-07-07 DIAGNOSIS — D509 Iron deficiency anemia, unspecified: Secondary | ICD-10-CM | POA: Diagnosis not present

## 2020-07-08 DIAGNOSIS — D631 Anemia in chronic kidney disease: Secondary | ICD-10-CM | POA: Diagnosis not present

## 2020-07-08 DIAGNOSIS — D509 Iron deficiency anemia, unspecified: Secondary | ICD-10-CM | POA: Diagnosis not present

## 2020-07-08 DIAGNOSIS — N186 End stage renal disease: Secondary | ICD-10-CM | POA: Diagnosis not present

## 2020-07-08 DIAGNOSIS — N2581 Secondary hyperparathyroidism of renal origin: Secondary | ICD-10-CM | POA: Diagnosis not present

## 2020-07-09 DIAGNOSIS — D631 Anemia in chronic kidney disease: Secondary | ICD-10-CM | POA: Diagnosis not present

## 2020-07-09 DIAGNOSIS — N2581 Secondary hyperparathyroidism of renal origin: Secondary | ICD-10-CM | POA: Diagnosis not present

## 2020-07-09 DIAGNOSIS — D509 Iron deficiency anemia, unspecified: Secondary | ICD-10-CM | POA: Diagnosis not present

## 2020-07-09 DIAGNOSIS — N186 End stage renal disease: Secondary | ICD-10-CM | POA: Diagnosis not present

## 2020-07-10 DIAGNOSIS — D631 Anemia in chronic kidney disease: Secondary | ICD-10-CM | POA: Diagnosis not present

## 2020-07-10 DIAGNOSIS — N186 End stage renal disease: Secondary | ICD-10-CM | POA: Diagnosis not present

## 2020-07-10 DIAGNOSIS — D509 Iron deficiency anemia, unspecified: Secondary | ICD-10-CM | POA: Diagnosis not present

## 2020-07-10 DIAGNOSIS — N2581 Secondary hyperparathyroidism of renal origin: Secondary | ICD-10-CM | POA: Diagnosis not present

## 2020-07-11 DIAGNOSIS — D631 Anemia in chronic kidney disease: Secondary | ICD-10-CM | POA: Diagnosis not present

## 2020-07-11 DIAGNOSIS — N2581 Secondary hyperparathyroidism of renal origin: Secondary | ICD-10-CM | POA: Diagnosis not present

## 2020-07-11 DIAGNOSIS — N186 End stage renal disease: Secondary | ICD-10-CM | POA: Diagnosis not present

## 2020-07-11 DIAGNOSIS — D509 Iron deficiency anemia, unspecified: Secondary | ICD-10-CM | POA: Diagnosis not present

## 2020-07-12 DIAGNOSIS — D631 Anemia in chronic kidney disease: Secondary | ICD-10-CM | POA: Diagnosis not present

## 2020-07-12 DIAGNOSIS — D509 Iron deficiency anemia, unspecified: Secondary | ICD-10-CM | POA: Diagnosis not present

## 2020-07-12 DIAGNOSIS — N186 End stage renal disease: Secondary | ICD-10-CM | POA: Diagnosis not present

## 2020-07-12 DIAGNOSIS — N2581 Secondary hyperparathyroidism of renal origin: Secondary | ICD-10-CM | POA: Diagnosis not present

## 2020-07-13 DIAGNOSIS — N2581 Secondary hyperparathyroidism of renal origin: Secondary | ICD-10-CM | POA: Diagnosis not present

## 2020-07-13 DIAGNOSIS — I10 Essential (primary) hypertension: Secondary | ICD-10-CM | POA: Diagnosis not present

## 2020-07-13 DIAGNOSIS — N186 End stage renal disease: Secondary | ICD-10-CM | POA: Diagnosis not present

## 2020-07-13 DIAGNOSIS — Z7189 Other specified counseling: Secondary | ICD-10-CM | POA: Diagnosis not present

## 2020-07-13 DIAGNOSIS — D509 Iron deficiency anemia, unspecified: Secondary | ICD-10-CM | POA: Diagnosis not present

## 2020-07-13 DIAGNOSIS — F411 Generalized anxiety disorder: Secondary | ICD-10-CM | POA: Diagnosis not present

## 2020-07-13 DIAGNOSIS — R112 Nausea with vomiting, unspecified: Secondary | ICD-10-CM | POA: Diagnosis not present

## 2020-07-13 DIAGNOSIS — D631 Anemia in chronic kidney disease: Secondary | ICD-10-CM | POA: Diagnosis not present

## 2020-07-14 ENCOUNTER — Encounter (HOSPITAL_COMMUNITY): Payer: Self-pay

## 2020-07-14 ENCOUNTER — Telehealth (INDEPENDENT_AMBULATORY_CARE_PROVIDER_SITE_OTHER): Payer: Medicare PPO | Admitting: Psychiatry

## 2020-07-14 ENCOUNTER — Encounter (HOSPITAL_COMMUNITY): Payer: Self-pay | Admitting: Psychiatry

## 2020-07-14 ENCOUNTER — Other Ambulatory Visit: Payer: Self-pay

## 2020-07-14 DIAGNOSIS — D509 Iron deficiency anemia, unspecified: Secondary | ICD-10-CM | POA: Diagnosis not present

## 2020-07-14 DIAGNOSIS — F411 Generalized anxiety disorder: Secondary | ICD-10-CM | POA: Diagnosis not present

## 2020-07-14 DIAGNOSIS — F322 Major depressive disorder, single episode, severe without psychotic features: Secondary | ICD-10-CM

## 2020-07-14 DIAGNOSIS — D631 Anemia in chronic kidney disease: Secondary | ICD-10-CM | POA: Diagnosis not present

## 2020-07-14 DIAGNOSIS — N186 End stage renal disease: Secondary | ICD-10-CM | POA: Diagnosis not present

## 2020-07-14 DIAGNOSIS — N2581 Secondary hyperparathyroidism of renal origin: Secondary | ICD-10-CM | POA: Diagnosis not present

## 2020-07-14 MED ORDER — ALPRAZOLAM 1 MG PO TABS: 1.0000 mg | ORAL_TABLET | Freq: Two times a day (BID) | ORAL | 2 refills | Status: DC | PRN

## 2020-07-14 MED ORDER — BUPROPION HCL ER (XL) 300 MG PO TB24: 300.0000 mg | ORAL_TABLET | ORAL | 2 refills | Status: DC

## 2020-07-14 MED ORDER — TRAZODONE HCL 50 MG PO TABS: 50.0000 mg | ORAL_TABLET | Freq: Every day | ORAL | 2 refills | Status: DC

## 2020-07-14 MED ORDER — PRAZOSIN HCL 5 MG PO CAPS: 5.0000 mg | ORAL_CAPSULE | Freq: Every day | ORAL | 2 refills | Status: DC

## 2020-07-14 MED ORDER — ALPRAZOLAM 1 MG PO TABS: 1.0000 mg | ORAL_TABLET | Freq: Two times a day (BID) | ORAL | 0 refills | Status: DC | PRN

## 2020-07-14 MED ORDER — SERTRALINE HCL 100 MG PO TABS: 100.0000 mg | ORAL_TABLET | Freq: Every day | ORAL | 2 refills | Status: DC

## 2020-07-14 NOTE — Progress Notes (Signed)
Virtual Visit via Telephone Note  I connected with [name] on 07/14/20 at  1:00 PM EDT by telephone and verified that I am speaking with the correct person using two identifiers.  Location: Patient: home Provider: home   I discussed the limitations, risks, security and privacy concerns of performing an evaluation and management service by telephone and the availability of in person appointments. I also discussed with the patient that there may be a patient responsible charge related to this service. The patient expressed understanding and agreed to proceed.    I discussed the assessment and treatment plan with the patient. The patient was provided an opportunity to ask questions and all were answered. The patient agreed with the plan and demonstrated an understanding of the instructions.   The patient was advised to call back or seek an in-person evaluation if the symptoms worsen or if the condition fails to improve as anticipated.  I provided 15 minutes of non-face-to-face time during this encounter.   [name], MD  [facility] MD/PA/NP OP Progress Note  07/14/2020 1:24 PM TANDY GRAWE  MRN:  [id number]  Chief Complaint:  Chief Complaint    Anxiety; Depression; Follow-up     HPI: This patient is a 60 year old widowed black female who lives with her son and asked in [place].  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  The patient returns for follow-up after 2 months.  She states that lately she has been having a lot of panic attacks at night this happens after she hooks up her dialysis.  She has had some episodes of her blood pressure running low at night for example in the 40s and low 50s.  I urged her to talk to her renal specialist about this.  It sounds as if the dialysis needs to be adjusted and/or she needs to be on less medication for hypertension.  I told her for now we should discontinue the prazosin because it can drop blood pressure.  The patient  cannot think of anything else is making her anxious.  She did run out of the Xanax because she is taking 2 a day at times because of her anxiety.  The renal transplant team did not want her to be on a lot of Xanax however given the high level of anxiety right now I think perhaps 2 mg a day is reasonable.  She is in agreement.  I also offered to add trazodone to help her with sleep.  She denies depression or suicidal ideation but is very anxious lately Visit Diagnosis:    ICD-10-CM   1. Major depressive disorder, single episode, severe without psychotic features ([place])  F32.2   2. GAD (generalized anxiety disorder)  F41.1     Past Psychiatric History: Long-term outpatient treatment  Past Medical History:  Past Medical History:  Diagnosis Date  . Anxiety   . Arthritis   . Depression   . Diabetes mellitus without complication ([place])   . GERD (gastroesophageal reflux disease)   . Gout   . Heart murmur   . Hypertension   . Renal cancer ([place])   . Renal disease 10/2012  . Renal insufficiency     Past Surgical History:  Procedure Laterality Date  . CESAREAN SECTION  [id number]  . CHONDROPLASTY Right 08/22/2012   Procedure: CHONDROPLASTY;  Surgeon: Carole Civil, MD;  Location: [facility];  Service: Orthopedics;  Laterality: Right;  . COLONOSCOPY WITH PROPOFOL N/A 03/16/2014   Procedure:  COLONOSCOPY WITH PROPOFOL (at cecum 1023, total withdrawal time=9 minutes);  Surgeon: Danie Binder, MD;  Location: [facility];  Service: Endoscopy;  Laterality: N/A;  . GASTRIC BYPASS    . KNEE ARTHROSCOPY WITH MEDIAL MENISECTOMY Right 08/22/2012   Procedure: KNEE ARTHROSCOPY WITH PARTIAL MEDIAL MENISECTOMY;  Surgeon: Carole Civil, MD;  Location: [facility];  Service: Orthopedics;  Laterality: Right;  . PARTIAL NEPHRECTOMY  Dec 2014   left  . TENDON REPAIR      Family Psychiatric History: see below  Family History:  Family History  Problem Relation Age of Onset  . Heart attack Mother   . Heart attack  Father   . Depression Paternal Aunt   . Alcohol abuse Maternal Uncle   . Colon cancer Maternal Aunt   . Colon cancer Maternal Uncle     Social History:  Social History   Socioeconomic History  . Marital status: Widowed    Spouse name: Not on file  . Number of children: Not on file  . Years of education: Not on file  . Highest education level: Not on file  Occupational History  . Occupation: Visual merchandiser: [employer]  Tobacco Use  . Smoking status: Never Smoker  . Smokeless tobacco: Never Used  Substance and Sexual Activity  . Alcohol use: No    Alcohol/week: 0.0 standard drinks  . Drug use: No  . Sexual activity: Not on file  Other Topics Concern  . Not on file  Social History Narrative  . Not on file   Social Determinants of Health   Financial Resource Strain: Not on file  Food Insecurity: Not on file  Transportation Needs: Not on file  Physical Activity: Not on file  Stress: Not on file  Social Connections: Not on file    Allergies:  Allergies  Allergen Reactions  . Skelaxin [Metaxalone] Hives and Rash    Metabolic Disorder Labs: No results found for: HGBA1C, MPG No results found for: PROLACTIN Lab Results  Component Value Date   CHOL 215 (H) 03/11/2014   TRIG 288 (H) 03/11/2014   HDL 33 (L) 03/11/2014   CHOLHDL 6.5 03/11/2014   VLDL 58 (H) 03/11/2014   LDLCALC 124 (H) 03/11/2014   No results found for: TSH  Therapeutic Level Labs: No results found for: LITHIUM No results found for: VALPROATE No components found for:  CBMZ  Current Medications: Current Outpatient Medications  Medication Sig Dispense Refill  . ALPRAZolam (XANAX) 1 MG tablet Take 1 tablet (1 mg total) by mouth 2 (two) times daily as needed for anxiety. 15 tablet 0  . traZODone (DESYREL) 50 MG tablet Take 1 tablet (50 mg total) by mouth at bedtime. 30 tablet 2  . ALPRAZolam (XANAX) 1 MG tablet Take 1 tablet (1 mg total) by mouth 2 (two) times daily as  needed for anxiety. 180 tablet 2  . amLODipine (NORVASC) 10 MG tablet Take 1 tablet by mouth daily.    Marland Kitchen buPROPion (WELLBUTRIN XL) 300 MG 24 hr tablet Take 1 tablet (300 mg total) by mouth every morning. 90 tablet 2  . calcitRIOL (ROCALTROL) 0.25 MCG capsule Take 1 capsule by mouth. 6 times weekly - Monday thru Saturday    . hydrALAZINE (APRESOLINE) 100 MG tablet TAKE 1 TABLET THREE TIMES DAILY 270 tablet 1  . labetalol (NORMODYNE) 200 MG tablet Take 1 tablet (200 mg total) by mouth 2 (two) times daily. 60 tablet 2  . lactulose (CONSTULOSE) 10 GM/15ML solution Take  10 mLs by mouth 2 (two) times daily as needed for constipation.    Marland Kitchen levothyroxine (SYNTHROID, LEVOTHROID) 200 MCG tablet Take 1 tablet by mouth daily. X 6 days of the week & 345mcg on one day of the week    . losartan (COZAAR) 100 MG tablet Take 1 tablet (100 mg total) by mouth daily. 90 tablet 3  . meclizine (ANTIVERT) 25 MG tablet Take 1 tablet (25 mg total) by mouth every 8 (eight) hours as needed for dizziness. 90 tablet 0  . multivitamin (RENA-VIT) TABS tablet Take 1 tablet by mouth daily.    . prazosin (MINIPRESS) 5 MG capsule Take 1 capsule (5 mg total) by mouth at bedtime. 90 capsule 2  . sertraline (ZOLOFT) 100 MG tablet Take 1 tablet (100 mg total) by mouth daily. 90 tablet 2  . sevelamer carbonate (RENVELA) 800 MG tablet Take 800 mg by mouth 3 (three) times daily. Does 400mg  twice a day with snacks    . Thiamine HCl (VITAMIN B1 PO) Take 1 tablet by mouth 3 (three) times a week.    Marland Kitchen tiZANidine (ZANAFLEX) 4 MG tablet TAKE 1 TABLET BY MOUTH EVERY DAY 30 tablet 1  . vitamin B-12 (CYANOCOBALAMIN) 500 MCG tablet Take 500 mcg by mouth daily.     No current facility-administered medications for this visit.     Musculoskeletal: Strength & Muscle Tone: within normal limits Gait & Station: normal Patient leans: N/A  Psychiatric Specialty Exam: Review of Systems  Neurological: Positive for light-headedness.   Psychiatric/Behavioral: The patient is nervous/anxious.   All other systems reviewed and are negative.   There were no vitals taken for this visit.There is no height or weight on file to calculate BMI.  General Appearance: NA  Eye Contact:  NA  Speech:  Clear and Coherent  Volume:  Normal  Mood:  Anxious  Affect:  NA  Thought Process:  Goal Directed  Orientation:  Full (Time, Place, and Person)  Thought Content: Rumination   Suicidal Thoughts:  No  Homicidal Thoughts:  No  Memory:  Immediate;   Good Recent;   Good Remote;   Good  Judgement:  Good  Insight:  Good  Psychomotor Activity:  Normal  Concentration:  Concentration: Good and Attention Span: Good  Recall:  Good  Fund of Knowledge: Good  Language: Good  Akathisia:  No  Handed:  Right  AIMS (if indicated): not done  Assets:  Communication Skills Desire for Improvement Resilience Social Support Talents/Skills  ADL's:  Intact  Cognition: WNL  Sleep:  Poor   Screenings: PHQ2-9   Flowsheet Row Video Visit from 07/14/2020 in [facility]  PHQ-2 Total Score 1    Flowsheet Row Video Visit from 07/14/2020 in [facility]  C-SSRS RISK CATEGORY No Risk       Assessment and Plan: Patient is a 60 year old female with a history of end-stage renal disease depression and anxiety.  She is having a lot of anxiety and panic at night.  Some of this may be due to her blood pressure dropping and I urged her to talk to her renal specialist about this.  For now we will hold the prazosin at bedtime.  She will increase Xanax to 1 mg twice a day to help anxiety and also add trazodone 50 mg at night for sleep.  She will continue Zoloft 100 mg daily for depression as well as Wellbutrin XL 300 mg daily for depression.  She will return  to see me in 6 weeks   [name], MD 07/14/2020, 1:24 PM

## 2020-07-15 DIAGNOSIS — D509 Iron deficiency anemia, unspecified: Secondary | ICD-10-CM | POA: Diagnosis not present

## 2020-07-15 DIAGNOSIS — N2581 Secondary hyperparathyroidism of renal origin: Secondary | ICD-10-CM | POA: Diagnosis not present

## 2020-07-15 DIAGNOSIS — D631 Anemia in chronic kidney disease: Secondary | ICD-10-CM | POA: Diagnosis not present

## 2020-07-15 DIAGNOSIS — N186 End stage renal disease: Secondary | ICD-10-CM | POA: Diagnosis not present

## 2020-07-16 DIAGNOSIS — N2581 Secondary hyperparathyroidism of renal origin: Secondary | ICD-10-CM | POA: Diagnosis not present

## 2020-07-16 DIAGNOSIS — N186 End stage renal disease: Secondary | ICD-10-CM | POA: Diagnosis not present

## 2020-07-16 DIAGNOSIS — D509 Iron deficiency anemia, unspecified: Secondary | ICD-10-CM | POA: Diagnosis not present

## 2020-07-16 DIAGNOSIS — D631 Anemia in chronic kidney disease: Secondary | ICD-10-CM | POA: Diagnosis not present

## 2020-07-17 DIAGNOSIS — N2581 Secondary hyperparathyroidism of renal origin: Secondary | ICD-10-CM | POA: Diagnosis not present

## 2020-07-17 DIAGNOSIS — D509 Iron deficiency anemia, unspecified: Secondary | ICD-10-CM | POA: Diagnosis not present

## 2020-07-17 DIAGNOSIS — D631 Anemia in chronic kidney disease: Secondary | ICD-10-CM | POA: Diagnosis not present

## 2020-07-17 DIAGNOSIS — N186 End stage renal disease: Secondary | ICD-10-CM | POA: Diagnosis not present

## 2020-07-18 ENCOUNTER — Other Ambulatory Visit: Payer: Self-pay | Admitting: Cardiology

## 2020-07-18 DIAGNOSIS — N186 End stage renal disease: Secondary | ICD-10-CM | POA: Diagnosis not present

## 2020-07-18 DIAGNOSIS — N2581 Secondary hyperparathyroidism of renal origin: Secondary | ICD-10-CM | POA: Diagnosis not present

## 2020-07-18 DIAGNOSIS — D509 Iron deficiency anemia, unspecified: Secondary | ICD-10-CM | POA: Diagnosis not present

## 2020-07-18 DIAGNOSIS — D631 Anemia in chronic kidney disease: Secondary | ICD-10-CM | POA: Diagnosis not present

## 2020-07-19 DIAGNOSIS — N2581 Secondary hyperparathyroidism of renal origin: Secondary | ICD-10-CM | POA: Diagnosis not present

## 2020-07-19 DIAGNOSIS — N186 End stage renal disease: Secondary | ICD-10-CM | POA: Diagnosis not present

## 2020-07-19 DIAGNOSIS — I10 Essential (primary) hypertension: Secondary | ICD-10-CM | POA: Diagnosis not present

## 2020-07-19 DIAGNOSIS — B9681 Helicobacter pylori [H. pylori] as the cause of diseases classified elsewhere: Secondary | ICD-10-CM | POA: Diagnosis not present

## 2020-07-19 DIAGNOSIS — D509 Iron deficiency anemia, unspecified: Secondary | ICD-10-CM | POA: Diagnosis not present

## 2020-07-19 DIAGNOSIS — D631 Anemia in chronic kidney disease: Secondary | ICD-10-CM | POA: Diagnosis not present

## 2020-07-20 DIAGNOSIS — N186 End stage renal disease: Secondary | ICD-10-CM | POA: Diagnosis not present

## 2020-07-20 DIAGNOSIS — D631 Anemia in chronic kidney disease: Secondary | ICD-10-CM | POA: Diagnosis not present

## 2020-07-20 DIAGNOSIS — N2581 Secondary hyperparathyroidism of renal origin: Secondary | ICD-10-CM | POA: Diagnosis not present

## 2020-07-20 DIAGNOSIS — D509 Iron deficiency anemia, unspecified: Secondary | ICD-10-CM | POA: Diagnosis not present

## 2020-07-21 DIAGNOSIS — D509 Iron deficiency anemia, unspecified: Secondary | ICD-10-CM | POA: Diagnosis not present

## 2020-07-21 DIAGNOSIS — N2581 Secondary hyperparathyroidism of renal origin: Secondary | ICD-10-CM | POA: Diagnosis not present

## 2020-07-21 DIAGNOSIS — N186 End stage renal disease: Secondary | ICD-10-CM | POA: Diagnosis not present

## 2020-07-21 DIAGNOSIS — D631 Anemia in chronic kidney disease: Secondary | ICD-10-CM | POA: Diagnosis not present

## 2020-07-22 DIAGNOSIS — N2581 Secondary hyperparathyroidism of renal origin: Secondary | ICD-10-CM | POA: Diagnosis not present

## 2020-07-22 DIAGNOSIS — D509 Iron deficiency anemia, unspecified: Secondary | ICD-10-CM | POA: Diagnosis not present

## 2020-07-22 DIAGNOSIS — D631 Anemia in chronic kidney disease: Secondary | ICD-10-CM | POA: Diagnosis not present

## 2020-07-22 DIAGNOSIS — N186 End stage renal disease: Secondary | ICD-10-CM | POA: Diagnosis not present

## 2020-07-23 DIAGNOSIS — N186 End stage renal disease: Secondary | ICD-10-CM | POA: Diagnosis not present

## 2020-07-23 DIAGNOSIS — D509 Iron deficiency anemia, unspecified: Secondary | ICD-10-CM | POA: Diagnosis not present

## 2020-07-23 DIAGNOSIS — N2581 Secondary hyperparathyroidism of renal origin: Secondary | ICD-10-CM | POA: Diagnosis not present

## 2020-07-23 DIAGNOSIS — D631 Anemia in chronic kidney disease: Secondary | ICD-10-CM | POA: Diagnosis not present

## 2020-07-24 DIAGNOSIS — N2581 Secondary hyperparathyroidism of renal origin: Secondary | ICD-10-CM | POA: Diagnosis not present

## 2020-07-24 DIAGNOSIS — N186 End stage renal disease: Secondary | ICD-10-CM | POA: Diagnosis not present

## 2020-07-24 DIAGNOSIS — D509 Iron deficiency anemia, unspecified: Secondary | ICD-10-CM | POA: Diagnosis not present

## 2020-07-24 DIAGNOSIS — D631 Anemia in chronic kidney disease: Secondary | ICD-10-CM | POA: Diagnosis not present

## 2020-07-25 DIAGNOSIS — N2581 Secondary hyperparathyroidism of renal origin: Secondary | ICD-10-CM | POA: Diagnosis not present

## 2020-07-25 DIAGNOSIS — N186 End stage renal disease: Secondary | ICD-10-CM | POA: Diagnosis not present

## 2020-07-25 DIAGNOSIS — D631 Anemia in chronic kidney disease: Secondary | ICD-10-CM | POA: Diagnosis not present

## 2020-07-25 DIAGNOSIS — D509 Iron deficiency anemia, unspecified: Secondary | ICD-10-CM | POA: Diagnosis not present

## 2020-07-26 DIAGNOSIS — N2581 Secondary hyperparathyroidism of renal origin: Secondary | ICD-10-CM | POA: Diagnosis not present

## 2020-07-26 DIAGNOSIS — N186 End stage renal disease: Secondary | ICD-10-CM | POA: Diagnosis not present

## 2020-07-26 DIAGNOSIS — D509 Iron deficiency anemia, unspecified: Secondary | ICD-10-CM | POA: Diagnosis not present

## 2020-07-26 DIAGNOSIS — D631 Anemia in chronic kidney disease: Secondary | ICD-10-CM | POA: Diagnosis not present

## 2020-07-27 DIAGNOSIS — N2581 Secondary hyperparathyroidism of renal origin: Secondary | ICD-10-CM | POA: Diagnosis not present

## 2020-07-27 DIAGNOSIS — D509 Iron deficiency anemia, unspecified: Secondary | ICD-10-CM | POA: Diagnosis not present

## 2020-07-27 DIAGNOSIS — N186 End stage renal disease: Secondary | ICD-10-CM | POA: Diagnosis not present

## 2020-07-27 DIAGNOSIS — D631 Anemia in chronic kidney disease: Secondary | ICD-10-CM | POA: Diagnosis not present

## 2020-07-28 DIAGNOSIS — D631 Anemia in chronic kidney disease: Secondary | ICD-10-CM | POA: Diagnosis not present

## 2020-07-28 DIAGNOSIS — D509 Iron deficiency anemia, unspecified: Secondary | ICD-10-CM | POA: Diagnosis not present

## 2020-07-28 DIAGNOSIS — N186 End stage renal disease: Secondary | ICD-10-CM | POA: Diagnosis not present

## 2020-07-28 DIAGNOSIS — N2581 Secondary hyperparathyroidism of renal origin: Secondary | ICD-10-CM | POA: Diagnosis not present

## 2020-07-29 DIAGNOSIS — N186 End stage renal disease: Secondary | ICD-10-CM | POA: Diagnosis not present

## 2020-07-29 DIAGNOSIS — N2581 Secondary hyperparathyroidism of renal origin: Secondary | ICD-10-CM | POA: Diagnosis not present

## 2020-07-29 DIAGNOSIS — D509 Iron deficiency anemia, unspecified: Secondary | ICD-10-CM | POA: Diagnosis not present

## 2020-07-29 DIAGNOSIS — D631 Anemia in chronic kidney disease: Secondary | ICD-10-CM | POA: Diagnosis not present

## 2020-07-30 DIAGNOSIS — N186 End stage renal disease: Secondary | ICD-10-CM | POA: Diagnosis not present

## 2020-07-30 DIAGNOSIS — N2581 Secondary hyperparathyroidism of renal origin: Secondary | ICD-10-CM | POA: Diagnosis not present

## 2020-07-30 DIAGNOSIS — D631 Anemia in chronic kidney disease: Secondary | ICD-10-CM | POA: Diagnosis not present

## 2020-07-30 DIAGNOSIS — Z992 Dependence on renal dialysis: Secondary | ICD-10-CM | POA: Diagnosis not present

## 2020-07-30 DIAGNOSIS — D509 Iron deficiency anemia, unspecified: Secondary | ICD-10-CM | POA: Diagnosis not present

## 2020-07-31 DIAGNOSIS — N186 End stage renal disease: Secondary | ICD-10-CM | POA: Diagnosis not present

## 2020-07-31 DIAGNOSIS — D631 Anemia in chronic kidney disease: Secondary | ICD-10-CM | POA: Diagnosis not present

## 2020-07-31 DIAGNOSIS — D509 Iron deficiency anemia, unspecified: Secondary | ICD-10-CM | POA: Diagnosis not present

## 2020-07-31 DIAGNOSIS — N2581 Secondary hyperparathyroidism of renal origin: Secondary | ICD-10-CM | POA: Diagnosis not present

## 2020-08-01 DIAGNOSIS — N186 End stage renal disease: Secondary | ICD-10-CM | POA: Diagnosis not present

## 2020-08-01 DIAGNOSIS — D631 Anemia in chronic kidney disease: Secondary | ICD-10-CM | POA: Diagnosis not present

## 2020-08-01 DIAGNOSIS — N2581 Secondary hyperparathyroidism of renal origin: Secondary | ICD-10-CM | POA: Diagnosis not present

## 2020-08-01 DIAGNOSIS — D509 Iron deficiency anemia, unspecified: Secondary | ICD-10-CM | POA: Diagnosis not present

## 2020-08-02 DIAGNOSIS — N186 End stage renal disease: Secondary | ICD-10-CM | POA: Diagnosis not present

## 2020-08-02 DIAGNOSIS — D509 Iron deficiency anemia, unspecified: Secondary | ICD-10-CM | POA: Diagnosis not present

## 2020-08-02 DIAGNOSIS — D631 Anemia in chronic kidney disease: Secondary | ICD-10-CM | POA: Diagnosis not present

## 2020-08-02 DIAGNOSIS — N2581 Secondary hyperparathyroidism of renal origin: Secondary | ICD-10-CM | POA: Diagnosis not present

## 2020-08-03 DIAGNOSIS — D631 Anemia in chronic kidney disease: Secondary | ICD-10-CM | POA: Diagnosis not present

## 2020-08-03 DIAGNOSIS — N2581 Secondary hyperparathyroidism of renal origin: Secondary | ICD-10-CM | POA: Diagnosis not present

## 2020-08-03 DIAGNOSIS — D509 Iron deficiency anemia, unspecified: Secondary | ICD-10-CM | POA: Diagnosis not present

## 2020-08-03 DIAGNOSIS — N186 End stage renal disease: Secondary | ICD-10-CM | POA: Diagnosis not present

## 2020-08-04 DIAGNOSIS — N186 End stage renal disease: Secondary | ICD-10-CM | POA: Diagnosis not present

## 2020-08-04 DIAGNOSIS — D631 Anemia in chronic kidney disease: Secondary | ICD-10-CM | POA: Diagnosis not present

## 2020-08-04 DIAGNOSIS — N2581 Secondary hyperparathyroidism of renal origin: Secondary | ICD-10-CM | POA: Diagnosis not present

## 2020-08-04 DIAGNOSIS — D509 Iron deficiency anemia, unspecified: Secondary | ICD-10-CM | POA: Diagnosis not present

## 2020-08-07 DIAGNOSIS — N2581 Secondary hyperparathyroidism of renal origin: Secondary | ICD-10-CM | POA: Diagnosis not present

## 2020-08-07 DIAGNOSIS — N186 End stage renal disease: Secondary | ICD-10-CM | POA: Diagnosis not present

## 2020-08-07 DIAGNOSIS — D509 Iron deficiency anemia, unspecified: Secondary | ICD-10-CM | POA: Diagnosis not present

## 2020-08-07 DIAGNOSIS — D631 Anemia in chronic kidney disease: Secondary | ICD-10-CM | POA: Diagnosis not present

## 2020-08-08 DIAGNOSIS — D631 Anemia in chronic kidney disease: Secondary | ICD-10-CM | POA: Diagnosis not present

## 2020-08-08 DIAGNOSIS — N2581 Secondary hyperparathyroidism of renal origin: Secondary | ICD-10-CM | POA: Diagnosis not present

## 2020-08-08 DIAGNOSIS — N186 End stage renal disease: Secondary | ICD-10-CM | POA: Diagnosis not present

## 2020-08-08 DIAGNOSIS — D509 Iron deficiency anemia, unspecified: Secondary | ICD-10-CM | POA: Diagnosis not present

## 2020-08-09 DIAGNOSIS — N2581 Secondary hyperparathyroidism of renal origin: Secondary | ICD-10-CM | POA: Diagnosis not present

## 2020-08-09 DIAGNOSIS — N186 End stage renal disease: Secondary | ICD-10-CM | POA: Diagnosis not present

## 2020-08-09 DIAGNOSIS — D509 Iron deficiency anemia, unspecified: Secondary | ICD-10-CM | POA: Diagnosis not present

## 2020-08-09 DIAGNOSIS — D631 Anemia in chronic kidney disease: Secondary | ICD-10-CM | POA: Diagnosis not present

## 2020-08-10 ENCOUNTER — Telehealth (HOSPITAL_COMMUNITY): Payer: Medicare PPO | Admitting: Psychiatry

## 2020-08-10 DIAGNOSIS — D631 Anemia in chronic kidney disease: Secondary | ICD-10-CM | POA: Diagnosis not present

## 2020-08-10 DIAGNOSIS — D509 Iron deficiency anemia, unspecified: Secondary | ICD-10-CM | POA: Diagnosis not present

## 2020-08-10 DIAGNOSIS — N2581 Secondary hyperparathyroidism of renal origin: Secondary | ICD-10-CM | POA: Diagnosis not present

## 2020-08-10 DIAGNOSIS — N186 End stage renal disease: Secondary | ICD-10-CM | POA: Diagnosis not present

## 2020-08-12 DIAGNOSIS — D631 Anemia in chronic kidney disease: Secondary | ICD-10-CM | POA: Diagnosis not present

## 2020-08-12 DIAGNOSIS — D509 Iron deficiency anemia, unspecified: Secondary | ICD-10-CM | POA: Diagnosis not present

## 2020-08-12 DIAGNOSIS — N186 End stage renal disease: Secondary | ICD-10-CM | POA: Diagnosis not present

## 2020-08-12 DIAGNOSIS — N2581 Secondary hyperparathyroidism of renal origin: Secondary | ICD-10-CM | POA: Diagnosis not present

## 2020-08-13 DIAGNOSIS — D509 Iron deficiency anemia, unspecified: Secondary | ICD-10-CM | POA: Diagnosis not present

## 2020-08-13 DIAGNOSIS — N186 End stage renal disease: Secondary | ICD-10-CM | POA: Diagnosis not present

## 2020-08-13 DIAGNOSIS — D631 Anemia in chronic kidney disease: Secondary | ICD-10-CM | POA: Diagnosis not present

## 2020-08-13 DIAGNOSIS — N2581 Secondary hyperparathyroidism of renal origin: Secondary | ICD-10-CM | POA: Diagnosis not present

## 2020-08-14 DIAGNOSIS — N2581 Secondary hyperparathyroidism of renal origin: Secondary | ICD-10-CM | POA: Diagnosis not present

## 2020-08-14 DIAGNOSIS — N186 End stage renal disease: Secondary | ICD-10-CM | POA: Diagnosis not present

## 2020-08-14 DIAGNOSIS — D509 Iron deficiency anemia, unspecified: Secondary | ICD-10-CM | POA: Diagnosis not present

## 2020-08-14 DIAGNOSIS — D631 Anemia in chronic kidney disease: Secondary | ICD-10-CM | POA: Diagnosis not present

## 2020-08-15 DIAGNOSIS — N2581 Secondary hyperparathyroidism of renal origin: Secondary | ICD-10-CM | POA: Diagnosis not present

## 2020-08-15 DIAGNOSIS — N186 End stage renal disease: Secondary | ICD-10-CM | POA: Diagnosis not present

## 2020-08-15 DIAGNOSIS — D631 Anemia in chronic kidney disease: Secondary | ICD-10-CM | POA: Diagnosis not present

## 2020-08-15 DIAGNOSIS — D509 Iron deficiency anemia, unspecified: Secondary | ICD-10-CM | POA: Diagnosis not present

## 2020-08-16 DIAGNOSIS — D509 Iron deficiency anemia, unspecified: Secondary | ICD-10-CM | POA: Diagnosis not present

## 2020-08-16 DIAGNOSIS — D631 Anemia in chronic kidney disease: Secondary | ICD-10-CM | POA: Diagnosis not present

## 2020-08-16 DIAGNOSIS — N186 End stage renal disease: Secondary | ICD-10-CM | POA: Diagnosis not present

## 2020-08-16 DIAGNOSIS — N2581 Secondary hyperparathyroidism of renal origin: Secondary | ICD-10-CM | POA: Diagnosis not present

## 2020-08-17 DIAGNOSIS — N186 End stage renal disease: Secondary | ICD-10-CM | POA: Diagnosis not present

## 2020-08-17 DIAGNOSIS — D631 Anemia in chronic kidney disease: Secondary | ICD-10-CM | POA: Diagnosis not present

## 2020-08-17 DIAGNOSIS — D509 Iron deficiency anemia, unspecified: Secondary | ICD-10-CM | POA: Diagnosis not present

## 2020-08-17 DIAGNOSIS — Z20822 Contact with and (suspected) exposure to covid-19: Secondary | ICD-10-CM | POA: Diagnosis not present

## 2020-08-17 DIAGNOSIS — Z7189 Other specified counseling: Secondary | ICD-10-CM | POA: Diagnosis not present

## 2020-08-17 DIAGNOSIS — I10 Essential (primary) hypertension: Secondary | ICD-10-CM | POA: Diagnosis not present

## 2020-08-17 DIAGNOSIS — N2581 Secondary hyperparathyroidism of renal origin: Secondary | ICD-10-CM | POA: Diagnosis not present

## 2020-08-18 DIAGNOSIS — D509 Iron deficiency anemia, unspecified: Secondary | ICD-10-CM | POA: Diagnosis not present

## 2020-08-18 DIAGNOSIS — D631 Anemia in chronic kidney disease: Secondary | ICD-10-CM | POA: Diagnosis not present

## 2020-08-18 DIAGNOSIS — N2581 Secondary hyperparathyroidism of renal origin: Secondary | ICD-10-CM | POA: Diagnosis not present

## 2020-08-18 DIAGNOSIS — N186 End stage renal disease: Secondary | ICD-10-CM | POA: Diagnosis not present

## 2020-08-20 DIAGNOSIS — D509 Iron deficiency anemia, unspecified: Secondary | ICD-10-CM | POA: Diagnosis not present

## 2020-08-20 DIAGNOSIS — N186 End stage renal disease: Secondary | ICD-10-CM | POA: Diagnosis not present

## 2020-08-20 DIAGNOSIS — N2581 Secondary hyperparathyroidism of renal origin: Secondary | ICD-10-CM | POA: Diagnosis not present

## 2020-08-20 DIAGNOSIS — D631 Anemia in chronic kidney disease: Secondary | ICD-10-CM | POA: Diagnosis not present

## 2020-08-21 DIAGNOSIS — D509 Iron deficiency anemia, unspecified: Secondary | ICD-10-CM | POA: Diagnosis not present

## 2020-08-21 DIAGNOSIS — N2581 Secondary hyperparathyroidism of renal origin: Secondary | ICD-10-CM | POA: Diagnosis not present

## 2020-08-21 DIAGNOSIS — D631 Anemia in chronic kidney disease: Secondary | ICD-10-CM | POA: Diagnosis not present

## 2020-08-21 DIAGNOSIS — N186 End stage renal disease: Secondary | ICD-10-CM | POA: Diagnosis not present

## 2020-08-22 ENCOUNTER — Encounter (HOSPITAL_COMMUNITY): Payer: Self-pay | Admitting: Psychiatry

## 2020-08-22 ENCOUNTER — Telehealth (INDEPENDENT_AMBULATORY_CARE_PROVIDER_SITE_OTHER): Payer: Medicare PPO | Admitting: Psychiatry

## 2020-08-22 ENCOUNTER — Other Ambulatory Visit: Payer: Self-pay

## 2020-08-22 DIAGNOSIS — F411 Generalized anxiety disorder: Secondary | ICD-10-CM

## 2020-08-22 DIAGNOSIS — N186 End stage renal disease: Secondary | ICD-10-CM | POA: Diagnosis not present

## 2020-08-22 DIAGNOSIS — D509 Iron deficiency anemia, unspecified: Secondary | ICD-10-CM | POA: Diagnosis not present

## 2020-08-22 DIAGNOSIS — D631 Anemia in chronic kidney disease: Secondary | ICD-10-CM | POA: Diagnosis not present

## 2020-08-22 DIAGNOSIS — F322 Major depressive disorder, single episode, severe without psychotic features: Secondary | ICD-10-CM | POA: Diagnosis not present

## 2020-08-22 DIAGNOSIS — N2581 Secondary hyperparathyroidism of renal origin: Secondary | ICD-10-CM | POA: Diagnosis not present

## 2020-08-22 MED ORDER — ALPRAZOLAM 1 MG PO TABS: 1.0000 mg | ORAL_TABLET | Freq: Two times a day (BID) | ORAL | 2 refills | Status: DC | PRN

## 2020-08-22 MED ORDER — BUPROPION HCL ER (XL) 300 MG PO TB24: 300.0000 mg | ORAL_TABLET | ORAL | 2 refills | Status: DC

## 2020-08-22 MED ORDER — SERTRALINE HCL 100 MG PO TABS: 100.0000 mg | ORAL_TABLET | Freq: Every day | ORAL | 2 refills | Status: DC

## 2020-08-22 MED ORDER — TRAZODONE HCL 50 MG PO TABS: 50.0000 mg | ORAL_TABLET | Freq: Every day | ORAL | 2 refills | Status: DC

## 2020-08-22 NOTE — Progress Notes (Signed)
Virtual Visit via Telephone Note  I connected with [name] on 08/22/20 at  1:20 PM EDT by telephone and verified that I am speaking with the correct person using two identifiers.  Location: Patient: home Provider: home office   I discussed the limitations, risks, security and privacy concerns of performing an evaluation and management service by telephone and the availability of in person appointments. I also discussed with the patient that there may be a patient responsible charge related to this service. The patient expressed understanding and agreed to proceed.    I discussed the assessment and treatment plan with the patient. The patient was provided an opportunity to ask questions and all were answered. The patient agreed with the plan and demonstrated an understanding of the instructions.   The patient was advised to call back or seek an in-person evaluation if the symptoms worsen or if the condition fails to improve as anticipated.  I provided 15 minutes of non-face-to-face time during this encounter.   [name], MD  [facility] MD/PA/NP OP Progress Note  08/22/2020 1:37 PM MICHEALA MORISSETTE  MRN:  [id number]  Chief Complaint:  Chief Complaint    Anxiety; Depression; Follow-up     HPI: This patient is a 60 year old widowed black female who lives with her son in [place].  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  The patient returns for follow-up after 6 weeks.  She states that she has been doing better.  We had stopped the prazosin at bedtime because her blood pressure was dropping during her nighttime dialysis.  Despite this she is still not having nightmares and is sleeping well with the trazodone.  She states that her mood has improved greatly because she is now able to work out in her garden.  She did lose a cousin last week who had a blood clot after surgery but other than that her stress level is not very high right now.  She thinks her medications  continue to help with her depression and anxiety symptoms. Visit Diagnosis:    ICD-10-CM   1. Major depressive disorder, single episode, severe without psychotic features ([place])  F32.2   2. GAD (generalized anxiety disorder)  F41.1     Past Psychiatric History: Long-term outpatient treatment  Past Medical History:  Past Medical History:  Diagnosis Date  . Anxiety   . Arthritis   . Depression   . Diabetes mellitus without complication ([place])   . GERD (gastroesophageal reflux disease)   . Gout   . Heart murmur   . Hypertension   . Renal cancer ([place])   . Renal disease 10/2012  . Renal insufficiency     Past Surgical History:  Procedure Laterality Date  . CESAREAN SECTION  [id number]  . CHONDROPLASTY Right 08/22/2012   Procedure: CHONDROPLASTY;  Surgeon: Carole Civil, MD;  Location: [facility];  Service: Orthopedics;  Laterality: Right;  . COLONOSCOPY WITH PROPOFOL N/A 03/16/2014   Procedure: COLONOSCOPY WITH PROPOFOL (at cecum 1023, total withdrawal time=9 minutes);  Surgeon: Danie Binder, MD;  Location: [facility];  Service: Endoscopy;  Laterality: N/A;  . GASTRIC BYPASS    . KNEE ARTHROSCOPY WITH MEDIAL MENISECTOMY Right 08/22/2012   Procedure: KNEE ARTHROSCOPY WITH PARTIAL MEDIAL MENISECTOMY;  Surgeon: Carole Civil, MD;  Location: [facility];  Service: Orthopedics;  Laterality: Right;  . PARTIAL NEPHRECTOMY  Dec 2014   left  . [place]  History: see below  Family History:  Family History  Problem Relation Age of Onset  . Heart attack Mother   . Heart attack Father   . Depression Paternal Aunt   . Alcohol abuse Maternal Uncle   . Colon cancer Maternal Aunt   . Colon cancer Maternal Uncle     Social History:  Social History   Socioeconomic History  . Marital status: Widowed    Spouse name: Not on file  . Number of children: Not on file  . Years of education: Not on file  . Highest education level: Not on file  Occupational History  .  Occupation: Visual merchandiser: [employer]  Tobacco Use  . Smoking status: Never Smoker  . Smokeless tobacco: Never Used  Substance and Sexual Activity  . Alcohol use: No    Alcohol/week: 0.0 standard drinks  . Drug use: No  . Sexual activity: Not on file  Other Topics Concern  . Not on file  Social History Narrative  . Not on file   Social Determinants of Health   Financial Resource Strain: Not on file  Food Insecurity: Not on file  Transportation Needs: Not on file  Physical Activity: Not on file  Stress: Not on file  Social Connections: Not on file    Allergies:  Allergies  Allergen Reactions  . Skelaxin [Metaxalone] Hives and Rash    Metabolic Disorder Labs: No results found for: HGBA1C, MPG No results found for: PROLACTIN Lab Results  Component Value Date   CHOL 215 (H) 03/11/2014   TRIG 288 (H) 03/11/2014   HDL 33 (L) 03/11/2014   CHOLHDL 6.5 03/11/2014   VLDL 58 (H) 03/11/2014   LDLCALC 124 (H) 03/11/2014   No results found for: TSH  Therapeutic Level Labs: No results found for: LITHIUM No results found for: VALPROATE No components found for:  CBMZ  Current Medications: Current Outpatient Medications  Medication Sig Dispense Refill  . ALPRAZolam (XANAX) 1 MG tablet Take 1 tablet (1 mg total) by mouth 2 (two) times daily as needed for anxiety. 15 tablet 0  . ALPRAZolam (XANAX) 1 MG tablet Take 1 tablet (1 mg total) by mouth 2 (two) times daily as needed for anxiety. 180 tablet 2  . amLODipine (NORVASC) 10 MG tablet Take 1 tablet by mouth daily.    Marland Kitchen buPROPion (WELLBUTRIN XL) 300 MG 24 hr tablet Take 1 tablet (300 mg total) by mouth every morning. 90 tablet 2  . calcitRIOL (ROCALTROL) 0.25 MCG capsule Take 1 capsule by mouth. 6 times weekly - Monday thru Saturday    . hydrALAZINE (APRESOLINE) 100 MG tablet TAKE 1 TABLET THREE TIMES DAILY 270 tablet 1  . labetalol (NORMODYNE) 200 MG tablet TAKE 1 TABLET TWICE DAILY 180 tablet 3  .  lactulose (CONSTULOSE) 10 GM/15ML solution Take 10 mLs by mouth 2 (two) times daily as needed for constipation.    Marland Kitchen levothyroxine (SYNTHROID, LEVOTHROID) 200 MCG tablet Take 1 tablet by mouth daily. X 6 days of the week & 383mcg on one day of the week    . losartan (COZAAR) 100 MG tablet Take 1 tablet (100 mg total) by mouth daily. 90 tablet 3  . meclizine (ANTIVERT) 25 MG tablet Take 1 tablet (25 mg total) by mouth every 8 (eight) hours as needed for dizziness. 90 tablet 0  . multivitamin (RENA-VIT) TABS tablet Take 1 tablet by mouth daily.    . sertraline (ZOLOFT) 100 MG tablet Take 1 tablet (100  mg total) by mouth daily. 90 tablet 2  . sevelamer carbonate (RENVELA) 800 MG tablet Take 800 mg by mouth 3 (three) times daily. Does 400mg  twice a day with snacks    . Thiamine HCl (VITAMIN B1 PO) Take 1 tablet by mouth 3 (three) times a week.    Marland Kitchen tiZANidine (ZANAFLEX) 4 MG tablet TAKE 1 TABLET BY MOUTH EVERY DAY 30 tablet 1  . traZODone (DESYREL) 50 MG tablet Take 1 tablet (50 mg total) by mouth at bedtime. 30 tablet 2  . vitamin B-12 (CYANOCOBALAMIN) 500 MCG tablet Take 500 mcg by mouth daily.     No current facility-administered medications for this visit.     Musculoskeletal: Strength & Muscle Tone: within normal limits Gait & Station: normal Patient leans: N/A  Psychiatric Specialty Exam: Review of Systems  All other systems reviewed and are negative.   There were no vitals taken for this visit.There is no height or weight on file to calculate BMI.  General Appearance: NA  Eye Contact:  NA  Speech:  Clear and Coherent  Volume:  Normal  Mood:  Euthymic  Affect:  NA  Thought Process:  Goal Directed  Orientation:  Full (Time, Place, and Person)  Thought Content: WDL   Suicidal Thoughts:  No  Homicidal Thoughts:  No  Memory:  Immediate;   Good Recent;   Good Remote;   Good  Judgement:  Good  Insight:  Good  Psychomotor Activity:  Normal  Concentration:  Concentration: Good  and Attention Span: Good  Recall:  Good  Fund of Knowledge: Good  Language: Good  Akathisia:  No  Handed:  Right  AIMS (if indicated): not done  Assets:  Communication Skills Desire for Improvement Physical Health Resilience Social Support Talents/Skills  ADL's:  Intact  Cognition: WNL  Sleep:  Good   Screenings: PHQ2-9   Flowsheet Row Video Visit from 08/22/2020 in [place] Video Visit from 07/14/2020 in [facility]  PHQ-2 Total Score 0 1    Flowsheet Row Video Visit from 08/22/2020 in [facility] Video Visit from 07/14/2020 in [place] No Risk No Risk       Assessment and Plan: This patient is a 60 year old female with a history of end-stage renal disease depression and anxiety.  She is doing well on her current regimen now.  She will continue Xanax 1 mg twice a day as needed for anxiety, trazodone 50 mg at night for sleep, Zoloft 100 mg daily for depression as well as Wellbutrin XL 300 mg daily for depression.  She will return to see me in 3 months   [name], MD 08/22/2020, 1:37 PM

## 2020-08-23 DIAGNOSIS — N2581 Secondary hyperparathyroidism of renal origin: Secondary | ICD-10-CM | POA: Diagnosis not present

## 2020-08-23 DIAGNOSIS — D631 Anemia in chronic kidney disease: Secondary | ICD-10-CM | POA: Diagnosis not present

## 2020-08-23 DIAGNOSIS — N186 End stage renal disease: Secondary | ICD-10-CM | POA: Diagnosis not present

## 2020-08-23 DIAGNOSIS — D509 Iron deficiency anemia, unspecified: Secondary | ICD-10-CM | POA: Diagnosis not present

## 2020-08-24 DIAGNOSIS — N2581 Secondary hyperparathyroidism of renal origin: Secondary | ICD-10-CM | POA: Diagnosis not present

## 2020-08-24 DIAGNOSIS — D509 Iron deficiency anemia, unspecified: Secondary | ICD-10-CM | POA: Diagnosis not present

## 2020-08-24 DIAGNOSIS — D631 Anemia in chronic kidney disease: Secondary | ICD-10-CM | POA: Diagnosis not present

## 2020-08-24 DIAGNOSIS — N186 End stage renal disease: Secondary | ICD-10-CM | POA: Diagnosis not present

## 2020-08-25 DIAGNOSIS — N2581 Secondary hyperparathyroidism of renal origin: Secondary | ICD-10-CM | POA: Diagnosis not present

## 2020-08-25 DIAGNOSIS — D631 Anemia in chronic kidney disease: Secondary | ICD-10-CM | POA: Diagnosis not present

## 2020-08-25 DIAGNOSIS — N186 End stage renal disease: Secondary | ICD-10-CM | POA: Diagnosis not present

## 2020-08-25 DIAGNOSIS — D509 Iron deficiency anemia, unspecified: Secondary | ICD-10-CM | POA: Diagnosis not present

## 2020-08-26 DIAGNOSIS — N2581 Secondary hyperparathyroidism of renal origin: Secondary | ICD-10-CM | POA: Diagnosis not present

## 2020-08-26 DIAGNOSIS — D631 Anemia in chronic kidney disease: Secondary | ICD-10-CM | POA: Diagnosis not present

## 2020-08-26 DIAGNOSIS — N186 End stage renal disease: Secondary | ICD-10-CM | POA: Diagnosis not present

## 2020-08-26 DIAGNOSIS — D509 Iron deficiency anemia, unspecified: Secondary | ICD-10-CM | POA: Diagnosis not present

## 2020-08-27 DIAGNOSIS — N186 End stage renal disease: Secondary | ICD-10-CM | POA: Diagnosis not present

## 2020-08-27 DIAGNOSIS — N2581 Secondary hyperparathyroidism of renal origin: Secondary | ICD-10-CM | POA: Diagnosis not present

## 2020-08-27 DIAGNOSIS — D509 Iron deficiency anemia, unspecified: Secondary | ICD-10-CM | POA: Diagnosis not present

## 2020-08-27 DIAGNOSIS — D631 Anemia in chronic kidney disease: Secondary | ICD-10-CM | POA: Diagnosis not present

## 2020-08-28 DIAGNOSIS — N186 End stage renal disease: Secondary | ICD-10-CM | POA: Diagnosis not present

## 2020-08-28 DIAGNOSIS — D631 Anemia in chronic kidney disease: Secondary | ICD-10-CM | POA: Diagnosis not present

## 2020-08-28 DIAGNOSIS — N2581 Secondary hyperparathyroidism of renal origin: Secondary | ICD-10-CM | POA: Diagnosis not present

## 2020-08-28 DIAGNOSIS — D509 Iron deficiency anemia, unspecified: Secondary | ICD-10-CM | POA: Diagnosis not present

## 2020-08-29 DIAGNOSIS — D509 Iron deficiency anemia, unspecified: Secondary | ICD-10-CM | POA: Diagnosis not present

## 2020-08-29 DIAGNOSIS — N2581 Secondary hyperparathyroidism of renal origin: Secondary | ICD-10-CM | POA: Diagnosis not present

## 2020-08-29 DIAGNOSIS — N186 End stage renal disease: Secondary | ICD-10-CM | POA: Diagnosis not present

## 2020-08-29 DIAGNOSIS — D631 Anemia in chronic kidney disease: Secondary | ICD-10-CM | POA: Diagnosis not present

## 2020-08-30 DIAGNOSIS — N2581 Secondary hyperparathyroidism of renal origin: Secondary | ICD-10-CM | POA: Diagnosis not present

## 2020-08-30 DIAGNOSIS — Z992 Dependence on renal dialysis: Secondary | ICD-10-CM | POA: Diagnosis not present

## 2020-08-30 DIAGNOSIS — D509 Iron deficiency anemia, unspecified: Secondary | ICD-10-CM | POA: Diagnosis not present

## 2020-08-30 DIAGNOSIS — D631 Anemia in chronic kidney disease: Secondary | ICD-10-CM | POA: Diagnosis not present

## 2020-08-30 DIAGNOSIS — N186 End stage renal disease: Secondary | ICD-10-CM | POA: Diagnosis not present

## 2020-08-31 DIAGNOSIS — Z7682 Awaiting organ transplant status: Secondary | ICD-10-CM | POA: Diagnosis not present

## 2020-08-31 DIAGNOSIS — D631 Anemia in chronic kidney disease: Secondary | ICD-10-CM | POA: Diagnosis not present

## 2020-08-31 DIAGNOSIS — D509 Iron deficiency anemia, unspecified: Secondary | ICD-10-CM | POA: Diagnosis not present

## 2020-08-31 DIAGNOSIS — N186 End stage renal disease: Secondary | ICD-10-CM | POA: Diagnosis not present

## 2020-08-31 DIAGNOSIS — N2581 Secondary hyperparathyroidism of renal origin: Secondary | ICD-10-CM | POA: Diagnosis not present

## 2020-09-01 DIAGNOSIS — D631 Anemia in chronic kidney disease: Secondary | ICD-10-CM | POA: Diagnosis not present

## 2020-09-01 DIAGNOSIS — D509 Iron deficiency anemia, unspecified: Secondary | ICD-10-CM | POA: Diagnosis not present

## 2020-09-01 DIAGNOSIS — N186 End stage renal disease: Secondary | ICD-10-CM | POA: Diagnosis not present

## 2020-09-01 DIAGNOSIS — N2581 Secondary hyperparathyroidism of renal origin: Secondary | ICD-10-CM | POA: Diagnosis not present

## 2020-09-03 DIAGNOSIS — N186 End stage renal disease: Secondary | ICD-10-CM | POA: Diagnosis not present

## 2020-09-03 DIAGNOSIS — D509 Iron deficiency anemia, unspecified: Secondary | ICD-10-CM | POA: Diagnosis not present

## 2020-09-03 DIAGNOSIS — N2581 Secondary hyperparathyroidism of renal origin: Secondary | ICD-10-CM | POA: Diagnosis not present

## 2020-09-03 DIAGNOSIS — D631 Anemia in chronic kidney disease: Secondary | ICD-10-CM | POA: Diagnosis not present

## 2020-09-04 DIAGNOSIS — D509 Iron deficiency anemia, unspecified: Secondary | ICD-10-CM | POA: Diagnosis not present

## 2020-09-04 DIAGNOSIS — D631 Anemia in chronic kidney disease: Secondary | ICD-10-CM | POA: Diagnosis not present

## 2020-09-04 DIAGNOSIS — N2581 Secondary hyperparathyroidism of renal origin: Secondary | ICD-10-CM | POA: Diagnosis not present

## 2020-09-04 DIAGNOSIS — N186 End stage renal disease: Secondary | ICD-10-CM | POA: Diagnosis not present

## 2020-09-05 DIAGNOSIS — N186 End stage renal disease: Secondary | ICD-10-CM | POA: Diagnosis not present

## 2020-09-05 DIAGNOSIS — R112 Nausea with vomiting, unspecified: Secondary | ICD-10-CM | POA: Diagnosis not present

## 2020-09-05 DIAGNOSIS — I10 Essential (primary) hypertension: Secondary | ICD-10-CM | POA: Diagnosis not present

## 2020-09-05 DIAGNOSIS — N2581 Secondary hyperparathyroidism of renal origin: Secondary | ICD-10-CM | POA: Diagnosis not present

## 2020-09-05 DIAGNOSIS — D631 Anemia in chronic kidney disease: Secondary | ICD-10-CM | POA: Diagnosis not present

## 2020-09-05 DIAGNOSIS — F411 Generalized anxiety disorder: Secondary | ICD-10-CM | POA: Diagnosis not present

## 2020-09-05 DIAGNOSIS — Z6379 Other stressful life events affecting family and household: Secondary | ICD-10-CM | POA: Diagnosis not present

## 2020-09-05 DIAGNOSIS — D509 Iron deficiency anemia, unspecified: Secondary | ICD-10-CM | POA: Diagnosis not present

## 2020-09-06 DIAGNOSIS — D631 Anemia in chronic kidney disease: Secondary | ICD-10-CM | POA: Diagnosis not present

## 2020-09-06 DIAGNOSIS — D509 Iron deficiency anemia, unspecified: Secondary | ICD-10-CM | POA: Diagnosis not present

## 2020-09-06 DIAGNOSIS — N186 End stage renal disease: Secondary | ICD-10-CM | POA: Diagnosis not present

## 2020-09-06 DIAGNOSIS — N2581 Secondary hyperparathyroidism of renal origin: Secondary | ICD-10-CM | POA: Diagnosis not present

## 2020-09-08 DIAGNOSIS — D631 Anemia in chronic kidney disease: Secondary | ICD-10-CM | POA: Diagnosis not present

## 2020-09-08 DIAGNOSIS — N186 End stage renal disease: Secondary | ICD-10-CM | POA: Diagnosis not present

## 2020-09-08 DIAGNOSIS — D509 Iron deficiency anemia, unspecified: Secondary | ICD-10-CM | POA: Diagnosis not present

## 2020-09-08 DIAGNOSIS — N2581 Secondary hyperparathyroidism of renal origin: Secondary | ICD-10-CM | POA: Diagnosis not present

## 2020-09-09 DIAGNOSIS — N2581 Secondary hyperparathyroidism of renal origin: Secondary | ICD-10-CM | POA: Diagnosis not present

## 2020-09-09 DIAGNOSIS — D631 Anemia in chronic kidney disease: Secondary | ICD-10-CM | POA: Diagnosis not present

## 2020-09-09 DIAGNOSIS — N186 End stage renal disease: Secondary | ICD-10-CM | POA: Diagnosis not present

## 2020-09-09 DIAGNOSIS — D509 Iron deficiency anemia, unspecified: Secondary | ICD-10-CM | POA: Diagnosis not present

## 2020-09-10 DIAGNOSIS — N2581 Secondary hyperparathyroidism of renal origin: Secondary | ICD-10-CM | POA: Diagnosis not present

## 2020-09-10 DIAGNOSIS — D631 Anemia in chronic kidney disease: Secondary | ICD-10-CM | POA: Diagnosis not present

## 2020-09-10 DIAGNOSIS — D509 Iron deficiency anemia, unspecified: Secondary | ICD-10-CM | POA: Diagnosis not present

## 2020-09-10 DIAGNOSIS — N186 End stage renal disease: Secondary | ICD-10-CM | POA: Diagnosis not present

## 2020-09-11 DIAGNOSIS — N2581 Secondary hyperparathyroidism of renal origin: Secondary | ICD-10-CM | POA: Diagnosis not present

## 2020-09-11 DIAGNOSIS — D631 Anemia in chronic kidney disease: Secondary | ICD-10-CM | POA: Diagnosis not present

## 2020-09-11 DIAGNOSIS — N186 End stage renal disease: Secondary | ICD-10-CM | POA: Diagnosis not present

## 2020-09-11 DIAGNOSIS — D509 Iron deficiency anemia, unspecified: Secondary | ICD-10-CM | POA: Diagnosis not present

## 2020-09-12 DIAGNOSIS — D509 Iron deficiency anemia, unspecified: Secondary | ICD-10-CM | POA: Diagnosis not present

## 2020-09-12 DIAGNOSIS — N2581 Secondary hyperparathyroidism of renal origin: Secondary | ICD-10-CM | POA: Diagnosis not present

## 2020-09-12 DIAGNOSIS — D631 Anemia in chronic kidney disease: Secondary | ICD-10-CM | POA: Diagnosis not present

## 2020-09-12 DIAGNOSIS — N186 End stage renal disease: Secondary | ICD-10-CM | POA: Diagnosis not present

## 2020-09-13 DIAGNOSIS — D631 Anemia in chronic kidney disease: Secondary | ICD-10-CM | POA: Diagnosis not present

## 2020-09-13 DIAGNOSIS — D509 Iron deficiency anemia, unspecified: Secondary | ICD-10-CM | POA: Diagnosis not present

## 2020-09-13 DIAGNOSIS — N2581 Secondary hyperparathyroidism of renal origin: Secondary | ICD-10-CM | POA: Diagnosis not present

## 2020-09-13 DIAGNOSIS — N186 End stage renal disease: Secondary | ICD-10-CM | POA: Diagnosis not present

## 2020-09-14 DIAGNOSIS — N2581 Secondary hyperparathyroidism of renal origin: Secondary | ICD-10-CM | POA: Diagnosis not present

## 2020-09-14 DIAGNOSIS — N186 End stage renal disease: Secondary | ICD-10-CM | POA: Diagnosis not present

## 2020-09-14 DIAGNOSIS — D631 Anemia in chronic kidney disease: Secondary | ICD-10-CM | POA: Diagnosis not present

## 2020-09-14 DIAGNOSIS — D509 Iron deficiency anemia, unspecified: Secondary | ICD-10-CM | POA: Diagnosis not present

## 2020-09-15 DIAGNOSIS — D631 Anemia in chronic kidney disease: Secondary | ICD-10-CM | POA: Diagnosis not present

## 2020-09-15 DIAGNOSIS — D509 Iron deficiency anemia, unspecified: Secondary | ICD-10-CM | POA: Diagnosis not present

## 2020-09-15 DIAGNOSIS — N2581 Secondary hyperparathyroidism of renal origin: Secondary | ICD-10-CM | POA: Diagnosis not present

## 2020-09-15 DIAGNOSIS — N186 End stage renal disease: Secondary | ICD-10-CM | POA: Diagnosis not present

## 2020-09-16 DIAGNOSIS — N2581 Secondary hyperparathyroidism of renal origin: Secondary | ICD-10-CM | POA: Diagnosis not present

## 2020-09-16 DIAGNOSIS — D631 Anemia in chronic kidney disease: Secondary | ICD-10-CM | POA: Diagnosis not present

## 2020-09-16 DIAGNOSIS — D509 Iron deficiency anemia, unspecified: Secondary | ICD-10-CM | POA: Diagnosis not present

## 2020-09-16 DIAGNOSIS — N186 End stage renal disease: Secondary | ICD-10-CM | POA: Diagnosis not present

## 2020-09-17 DIAGNOSIS — D631 Anemia in chronic kidney disease: Secondary | ICD-10-CM | POA: Diagnosis not present

## 2020-09-17 DIAGNOSIS — N186 End stage renal disease: Secondary | ICD-10-CM | POA: Diagnosis not present

## 2020-09-17 DIAGNOSIS — N2581 Secondary hyperparathyroidism of renal origin: Secondary | ICD-10-CM | POA: Diagnosis not present

## 2020-09-17 DIAGNOSIS — D509 Iron deficiency anemia, unspecified: Secondary | ICD-10-CM | POA: Diagnosis not present

## 2020-09-18 DIAGNOSIS — N2581 Secondary hyperparathyroidism of renal origin: Secondary | ICD-10-CM | POA: Diagnosis not present

## 2020-09-18 DIAGNOSIS — D509 Iron deficiency anemia, unspecified: Secondary | ICD-10-CM | POA: Diagnosis not present

## 2020-09-18 DIAGNOSIS — D631 Anemia in chronic kidney disease: Secondary | ICD-10-CM | POA: Diagnosis not present

## 2020-09-18 DIAGNOSIS — N186 End stage renal disease: Secondary | ICD-10-CM | POA: Diagnosis not present

## 2020-09-19 DIAGNOSIS — D631 Anemia in chronic kidney disease: Secondary | ICD-10-CM | POA: Diagnosis not present

## 2020-09-19 DIAGNOSIS — D509 Iron deficiency anemia, unspecified: Secondary | ICD-10-CM | POA: Diagnosis not present

## 2020-09-19 DIAGNOSIS — N2581 Secondary hyperparathyroidism of renal origin: Secondary | ICD-10-CM | POA: Diagnosis not present

## 2020-09-19 DIAGNOSIS — N186 End stage renal disease: Secondary | ICD-10-CM | POA: Diagnosis not present

## 2020-09-20 DIAGNOSIS — D631 Anemia in chronic kidney disease: Secondary | ICD-10-CM | POA: Diagnosis not present

## 2020-09-20 DIAGNOSIS — N2581 Secondary hyperparathyroidism of renal origin: Secondary | ICD-10-CM | POA: Diagnosis not present

## 2020-09-20 DIAGNOSIS — D509 Iron deficiency anemia, unspecified: Secondary | ICD-10-CM | POA: Diagnosis not present

## 2020-09-20 DIAGNOSIS — N186 End stage renal disease: Secondary | ICD-10-CM | POA: Diagnosis not present

## 2020-09-21 DIAGNOSIS — D509 Iron deficiency anemia, unspecified: Secondary | ICD-10-CM | POA: Diagnosis not present

## 2020-09-21 DIAGNOSIS — D631 Anemia in chronic kidney disease: Secondary | ICD-10-CM | POA: Diagnosis not present

## 2020-09-21 DIAGNOSIS — N2581 Secondary hyperparathyroidism of renal origin: Secondary | ICD-10-CM | POA: Diagnosis not present

## 2020-09-21 DIAGNOSIS — N186 End stage renal disease: Secondary | ICD-10-CM | POA: Diagnosis not present

## 2020-09-22 DIAGNOSIS — N186 End stage renal disease: Secondary | ICD-10-CM | POA: Diagnosis not present

## 2020-09-22 DIAGNOSIS — N2581 Secondary hyperparathyroidism of renal origin: Secondary | ICD-10-CM | POA: Diagnosis not present

## 2020-09-22 DIAGNOSIS — D631 Anemia in chronic kidney disease: Secondary | ICD-10-CM | POA: Diagnosis not present

## 2020-09-22 DIAGNOSIS — D509 Iron deficiency anemia, unspecified: Secondary | ICD-10-CM | POA: Diagnosis not present

## 2020-09-23 DIAGNOSIS — D631 Anemia in chronic kidney disease: Secondary | ICD-10-CM | POA: Diagnosis not present

## 2020-09-23 DIAGNOSIS — N2581 Secondary hyperparathyroidism of renal origin: Secondary | ICD-10-CM | POA: Diagnosis not present

## 2020-09-23 DIAGNOSIS — N186 End stage renal disease: Secondary | ICD-10-CM | POA: Diagnosis not present

## 2020-09-23 DIAGNOSIS — D509 Iron deficiency anemia, unspecified: Secondary | ICD-10-CM | POA: Diagnosis not present

## 2020-09-24 DIAGNOSIS — N2581 Secondary hyperparathyroidism of renal origin: Secondary | ICD-10-CM | POA: Diagnosis not present

## 2020-09-24 DIAGNOSIS — D631 Anemia in chronic kidney disease: Secondary | ICD-10-CM | POA: Diagnosis not present

## 2020-09-24 DIAGNOSIS — D509 Iron deficiency anemia, unspecified: Secondary | ICD-10-CM | POA: Diagnosis not present

## 2020-09-24 DIAGNOSIS — N186 End stage renal disease: Secondary | ICD-10-CM | POA: Diagnosis not present

## 2020-09-25 DIAGNOSIS — N186 End stage renal disease: Secondary | ICD-10-CM | POA: Diagnosis not present

## 2020-09-25 DIAGNOSIS — D509 Iron deficiency anemia, unspecified: Secondary | ICD-10-CM | POA: Diagnosis not present

## 2020-09-25 DIAGNOSIS — N2581 Secondary hyperparathyroidism of renal origin: Secondary | ICD-10-CM | POA: Diagnosis not present

## 2020-09-25 DIAGNOSIS — D631 Anemia in chronic kidney disease: Secondary | ICD-10-CM | POA: Diagnosis not present

## 2020-09-26 DIAGNOSIS — N186 End stage renal disease: Secondary | ICD-10-CM | POA: Diagnosis not present

## 2020-09-26 DIAGNOSIS — N2581 Secondary hyperparathyroidism of renal origin: Secondary | ICD-10-CM | POA: Diagnosis not present

## 2020-09-26 DIAGNOSIS — D509 Iron deficiency anemia, unspecified: Secondary | ICD-10-CM | POA: Diagnosis not present

## 2020-09-26 DIAGNOSIS — D631 Anemia in chronic kidney disease: Secondary | ICD-10-CM | POA: Diagnosis not present

## 2020-09-27 DIAGNOSIS — N2581 Secondary hyperparathyroidism of renal origin: Secondary | ICD-10-CM | POA: Diagnosis not present

## 2020-09-27 DIAGNOSIS — D631 Anemia in chronic kidney disease: Secondary | ICD-10-CM | POA: Diagnosis not present

## 2020-09-27 DIAGNOSIS — Z780 Asymptomatic menopausal state: Secondary | ICD-10-CM | POA: Diagnosis not present

## 2020-09-27 DIAGNOSIS — N186 End stage renal disease: Secondary | ICD-10-CM | POA: Diagnosis not present

## 2020-09-27 DIAGNOSIS — D509 Iron deficiency anemia, unspecified: Secondary | ICD-10-CM | POA: Diagnosis not present

## 2020-09-27 DIAGNOSIS — E569 Vitamin deficiency, unspecified: Secondary | ICD-10-CM | POA: Diagnosis not present

## 2020-09-27 DIAGNOSIS — E559 Vitamin D deficiency, unspecified: Secondary | ICD-10-CM | POA: Diagnosis not present

## 2020-09-27 DIAGNOSIS — Z9884 Bariatric surgery status: Secondary | ICD-10-CM | POA: Diagnosis not present

## 2020-09-28 DIAGNOSIS — D631 Anemia in chronic kidney disease: Secondary | ICD-10-CM | POA: Diagnosis not present

## 2020-09-28 DIAGNOSIS — Z780 Asymptomatic menopausal state: Secondary | ICD-10-CM | POA: Diagnosis not present

## 2020-09-28 DIAGNOSIS — N186 End stage renal disease: Secondary | ICD-10-CM | POA: Diagnosis not present

## 2020-09-28 DIAGNOSIS — D509 Iron deficiency anemia, unspecified: Secondary | ICD-10-CM | POA: Diagnosis not present

## 2020-09-28 DIAGNOSIS — N2581 Secondary hyperparathyroidism of renal origin: Secondary | ICD-10-CM | POA: Diagnosis not present

## 2020-09-29 DIAGNOSIS — N186 End stage renal disease: Secondary | ICD-10-CM | POA: Diagnosis not present

## 2020-09-29 DIAGNOSIS — D509 Iron deficiency anemia, unspecified: Secondary | ICD-10-CM | POA: Diagnosis not present

## 2020-09-29 DIAGNOSIS — D631 Anemia in chronic kidney disease: Secondary | ICD-10-CM | POA: Diagnosis not present

## 2020-09-29 DIAGNOSIS — Z992 Dependence on renal dialysis: Secondary | ICD-10-CM | POA: Diagnosis not present

## 2020-09-29 DIAGNOSIS — N2581 Secondary hyperparathyroidism of renal origin: Secondary | ICD-10-CM | POA: Diagnosis not present

## 2020-09-30 DIAGNOSIS — U071 COVID-19: Secondary | ICD-10-CM | POA: Diagnosis not present

## 2020-09-30 DIAGNOSIS — D631 Anemia in chronic kidney disease: Secondary | ICD-10-CM | POA: Diagnosis not present

## 2020-09-30 DIAGNOSIS — N186 End stage renal disease: Secondary | ICD-10-CM | POA: Diagnosis not present

## 2020-09-30 DIAGNOSIS — N2581 Secondary hyperparathyroidism of renal origin: Secondary | ICD-10-CM | POA: Diagnosis not present

## 2020-10-01 DIAGNOSIS — N186 End stage renal disease: Secondary | ICD-10-CM | POA: Diagnosis not present

## 2020-10-01 DIAGNOSIS — N2581 Secondary hyperparathyroidism of renal origin: Secondary | ICD-10-CM | POA: Diagnosis not present

## 2020-10-01 DIAGNOSIS — D631 Anemia in chronic kidney disease: Secondary | ICD-10-CM | POA: Diagnosis not present

## 2020-10-03 DIAGNOSIS — D631 Anemia in chronic kidney disease: Secondary | ICD-10-CM | POA: Diagnosis not present

## 2020-10-03 DIAGNOSIS — N2581 Secondary hyperparathyroidism of renal origin: Secondary | ICD-10-CM | POA: Diagnosis not present

## 2020-10-03 DIAGNOSIS — N186 End stage renal disease: Secondary | ICD-10-CM | POA: Diagnosis not present

## 2020-10-04 DIAGNOSIS — N2581 Secondary hyperparathyroidism of renal origin: Secondary | ICD-10-CM | POA: Diagnosis not present

## 2020-10-04 DIAGNOSIS — D631 Anemia in chronic kidney disease: Secondary | ICD-10-CM | POA: Diagnosis not present

## 2020-10-04 DIAGNOSIS — N186 End stage renal disease: Secondary | ICD-10-CM | POA: Diagnosis not present

## 2020-10-05 DIAGNOSIS — D631 Anemia in chronic kidney disease: Secondary | ICD-10-CM | POA: Diagnosis not present

## 2020-10-05 DIAGNOSIS — N2581 Secondary hyperparathyroidism of renal origin: Secondary | ICD-10-CM | POA: Diagnosis not present

## 2020-10-05 DIAGNOSIS — N186 End stage renal disease: Secondary | ICD-10-CM | POA: Diagnosis not present

## 2020-10-06 DIAGNOSIS — N186 End stage renal disease: Secondary | ICD-10-CM | POA: Diagnosis not present

## 2020-10-06 DIAGNOSIS — D631 Anemia in chronic kidney disease: Secondary | ICD-10-CM | POA: Diagnosis not present

## 2020-10-06 DIAGNOSIS — N2581 Secondary hyperparathyroidism of renal origin: Secondary | ICD-10-CM | POA: Diagnosis not present

## 2020-10-07 DIAGNOSIS — N2581 Secondary hyperparathyroidism of renal origin: Secondary | ICD-10-CM | POA: Diagnosis not present

## 2020-10-07 DIAGNOSIS — N186 End stage renal disease: Secondary | ICD-10-CM | POA: Diagnosis not present

## 2020-10-07 DIAGNOSIS — D631 Anemia in chronic kidney disease: Secondary | ICD-10-CM | POA: Diagnosis not present

## 2020-10-09 ENCOUNTER — Other Ambulatory Visit: Payer: Self-pay | Admitting: Cardiology

## 2020-10-09 DIAGNOSIS — N2581 Secondary hyperparathyroidism of renal origin: Secondary | ICD-10-CM | POA: Diagnosis not present

## 2020-10-09 DIAGNOSIS — N186 End stage renal disease: Secondary | ICD-10-CM | POA: Diagnosis not present

## 2020-10-09 DIAGNOSIS — D631 Anemia in chronic kidney disease: Secondary | ICD-10-CM | POA: Diagnosis not present

## 2020-10-10 DIAGNOSIS — D631 Anemia in chronic kidney disease: Secondary | ICD-10-CM | POA: Diagnosis not present

## 2020-10-10 DIAGNOSIS — N2581 Secondary hyperparathyroidism of renal origin: Secondary | ICD-10-CM | POA: Diagnosis not present

## 2020-10-10 DIAGNOSIS — N186 End stage renal disease: Secondary | ICD-10-CM | POA: Diagnosis not present

## 2020-10-11 ENCOUNTER — Telehealth: Payer: Self-pay | Admitting: Cardiology

## 2020-10-11 DIAGNOSIS — N186 End stage renal disease: Secondary | ICD-10-CM | POA: Diagnosis not present

## 2020-10-11 DIAGNOSIS — N2581 Secondary hyperparathyroidism of renal origin: Secondary | ICD-10-CM | POA: Diagnosis not present

## 2020-10-11 DIAGNOSIS — D631 Anemia in chronic kidney disease: Secondary | ICD-10-CM | POA: Diagnosis not present

## 2020-10-11 MED ORDER — LABETALOL HCL 100 MG PO TABS: 100.0000 mg | ORAL_TABLET | Freq: Two times a day (BID) | ORAL | 11 refills | Status: DC

## 2020-10-11 NOTE — Telephone Encounter (Signed)
Pt came to office stating she's dizzy- BP and HR dropping - pt is currently sitting in lobby.

## 2020-10-11 NOTE — Telephone Encounter (Signed)
Chart reviewed with Katina Dung, NP. Pt to decrease Labetalol to 100 mg BID and call office with update in one week. Pt notified and voiced understanding.

## 2020-10-11 NOTE — Telephone Encounter (Signed)
Pt reports that she feels dizzy when she stands and that her BP drops at times. She states that her BP does not drop every time she stands. She reports that she did tell her Kidney doctor about this and they decreased Hydralazine to BID instead of TID. Orthostatic BP are as follows lying down 132/72 HR 48, Sitting 122/70 HR 55, Standing 118/68 HR 60. Pt states that the doziness start 2 weeks ago. Pt has been on decreased dose of Hydralazine for 2 weeks also. She denies feeling dizzy at this time. States that when she walked in the building she became dizzy and had to sit down. Pt does report that she feel N/V at times when she is dizzy. Denies chest pain and SOB. No other symptoms at this time. Please advise.

## 2020-10-13 DIAGNOSIS — D631 Anemia in chronic kidney disease: Secondary | ICD-10-CM | POA: Diagnosis not present

## 2020-10-13 DIAGNOSIS — N2581 Secondary hyperparathyroidism of renal origin: Secondary | ICD-10-CM | POA: Diagnosis not present

## 2020-10-13 DIAGNOSIS — N186 End stage renal disease: Secondary | ICD-10-CM | POA: Diagnosis not present

## 2020-10-14 DIAGNOSIS — D631 Anemia in chronic kidney disease: Secondary | ICD-10-CM | POA: Diagnosis not present

## 2020-10-14 DIAGNOSIS — N186 End stage renal disease: Secondary | ICD-10-CM | POA: Diagnosis not present

## 2020-10-14 DIAGNOSIS — N2581 Secondary hyperparathyroidism of renal origin: Secondary | ICD-10-CM | POA: Diagnosis not present

## 2020-10-16 DIAGNOSIS — D631 Anemia in chronic kidney disease: Secondary | ICD-10-CM | POA: Diagnosis not present

## 2020-10-16 DIAGNOSIS — N2581 Secondary hyperparathyroidism of renal origin: Secondary | ICD-10-CM | POA: Diagnosis not present

## 2020-10-16 DIAGNOSIS — N186 End stage renal disease: Secondary | ICD-10-CM | POA: Diagnosis not present

## 2020-10-17 ENCOUNTER — Other Ambulatory Visit (HOSPITAL_COMMUNITY): Payer: Self-pay | Admitting: Psychiatry

## 2020-10-17 DIAGNOSIS — N186 End stage renal disease: Secondary | ICD-10-CM | POA: Diagnosis not present

## 2020-10-17 DIAGNOSIS — D631 Anemia in chronic kidney disease: Secondary | ICD-10-CM | POA: Diagnosis not present

## 2020-10-17 DIAGNOSIS — N2581 Secondary hyperparathyroidism of renal origin: Secondary | ICD-10-CM | POA: Diagnosis not present

## 2020-10-19 DIAGNOSIS — N2581 Secondary hyperparathyroidism of renal origin: Secondary | ICD-10-CM | POA: Diagnosis not present

## 2020-10-19 DIAGNOSIS — N186 End stage renal disease: Secondary | ICD-10-CM | POA: Diagnosis not present

## 2020-10-19 DIAGNOSIS — D631 Anemia in chronic kidney disease: Secondary | ICD-10-CM | POA: Diagnosis not present

## 2020-10-20 DIAGNOSIS — Z7682 Awaiting organ transplant status: Secondary | ICD-10-CM | POA: Diagnosis not present

## 2020-10-20 DIAGNOSIS — Z992 Dependence on renal dialysis: Secondary | ICD-10-CM | POA: Diagnosis not present

## 2020-10-20 DIAGNOSIS — D631 Anemia in chronic kidney disease: Secondary | ICD-10-CM | POA: Diagnosis not present

## 2020-10-20 DIAGNOSIS — Z01818 Encounter for other preprocedural examination: Secondary | ICD-10-CM | POA: Diagnosis not present

## 2020-10-20 DIAGNOSIS — Z9884 Bariatric surgery status: Secondary | ICD-10-CM | POA: Diagnosis not present

## 2020-10-20 DIAGNOSIS — E1122 Type 2 diabetes mellitus with diabetic chronic kidney disease: Secondary | ICD-10-CM | POA: Diagnosis not present

## 2020-10-20 DIAGNOSIS — Z114 Encounter for screening for human immunodeficiency virus [HIV]: Secondary | ICD-10-CM | POA: Diagnosis not present

## 2020-10-20 DIAGNOSIS — Z1159 Encounter for screening for other viral diseases: Secondary | ICD-10-CM | POA: Diagnosis not present

## 2020-10-20 DIAGNOSIS — Z9889 Other specified postprocedural states: Secondary | ICD-10-CM | POA: Diagnosis not present

## 2020-10-20 DIAGNOSIS — I10 Essential (primary) hypertension: Secondary | ICD-10-CM | POA: Diagnosis not present

## 2020-10-20 DIAGNOSIS — E119 Type 2 diabetes mellitus without complications: Secondary | ICD-10-CM | POA: Diagnosis not present

## 2020-10-20 DIAGNOSIS — N186 End stage renal disease: Secondary | ICD-10-CM | POA: Diagnosis not present

## 2020-10-20 DIAGNOSIS — Z85528 Personal history of other malignant neoplasm of kidney: Secondary | ICD-10-CM | POA: Diagnosis not present

## 2020-10-20 DIAGNOSIS — N2581 Secondary hyperparathyroidism of renal origin: Secondary | ICD-10-CM | POA: Diagnosis not present

## 2020-10-20 DIAGNOSIS — N051 Unspecified nephritic syndrome with focal and segmental glomerular lesions: Secondary | ICD-10-CM | POA: Diagnosis not present

## 2020-10-20 DIAGNOSIS — I120 Hypertensive chronic kidney disease with stage 5 chronic kidney disease or end stage renal disease: Secondary | ICD-10-CM | POA: Diagnosis not present

## 2020-10-20 DIAGNOSIS — F411 Generalized anxiety disorder: Secondary | ICD-10-CM | POA: Diagnosis not present

## 2020-10-21 DIAGNOSIS — H61893 Other specified disorders of external ear, bilateral: Secondary | ICD-10-CM | POA: Diagnosis not present

## 2020-10-21 DIAGNOSIS — D631 Anemia in chronic kidney disease: Secondary | ICD-10-CM | POA: Diagnosis not present

## 2020-10-21 DIAGNOSIS — L309 Dermatitis, unspecified: Secondary | ICD-10-CM | POA: Diagnosis not present

## 2020-10-21 DIAGNOSIS — N2581 Secondary hyperparathyroidism of renal origin: Secondary | ICD-10-CM | POA: Diagnosis not present

## 2020-10-21 DIAGNOSIS — N186 End stage renal disease: Secondary | ICD-10-CM | POA: Diagnosis not present

## 2020-10-21 DIAGNOSIS — H6063 Unspecified chronic otitis externa, bilateral: Secondary | ICD-10-CM | POA: Diagnosis not present

## 2020-10-23 DIAGNOSIS — D631 Anemia in chronic kidney disease: Secondary | ICD-10-CM | POA: Diagnosis not present

## 2020-10-23 DIAGNOSIS — N2581 Secondary hyperparathyroidism of renal origin: Secondary | ICD-10-CM | POA: Diagnosis not present

## 2020-10-23 DIAGNOSIS — N186 End stage renal disease: Secondary | ICD-10-CM | POA: Diagnosis not present

## 2020-10-24 DIAGNOSIS — N186 End stage renal disease: Secondary | ICD-10-CM | POA: Diagnosis not present

## 2020-10-24 DIAGNOSIS — D631 Anemia in chronic kidney disease: Secondary | ICD-10-CM | POA: Diagnosis not present

## 2020-10-24 DIAGNOSIS — N2581 Secondary hyperparathyroidism of renal origin: Secondary | ICD-10-CM | POA: Diagnosis not present

## 2020-10-25 DIAGNOSIS — N2581 Secondary hyperparathyroidism of renal origin: Secondary | ICD-10-CM | POA: Diagnosis not present

## 2020-10-25 DIAGNOSIS — N186 End stage renal disease: Secondary | ICD-10-CM | POA: Diagnosis not present

## 2020-10-25 DIAGNOSIS — D631 Anemia in chronic kidney disease: Secondary | ICD-10-CM | POA: Diagnosis not present

## 2020-10-26 DIAGNOSIS — N186 End stage renal disease: Secondary | ICD-10-CM | POA: Diagnosis not present

## 2020-10-26 DIAGNOSIS — N2581 Secondary hyperparathyroidism of renal origin: Secondary | ICD-10-CM | POA: Diagnosis not present

## 2020-10-26 DIAGNOSIS — D631 Anemia in chronic kidney disease: Secondary | ICD-10-CM | POA: Diagnosis not present

## 2020-10-28 DIAGNOSIS — N2581 Secondary hyperparathyroidism of renal origin: Secondary | ICD-10-CM | POA: Diagnosis not present

## 2020-10-28 DIAGNOSIS — D631 Anemia in chronic kidney disease: Secondary | ICD-10-CM | POA: Diagnosis not present

## 2020-10-28 DIAGNOSIS — N186 End stage renal disease: Secondary | ICD-10-CM | POA: Diagnosis not present

## 2020-10-29 DIAGNOSIS — D631 Anemia in chronic kidney disease: Secondary | ICD-10-CM | POA: Diagnosis not present

## 2020-10-29 DIAGNOSIS — N2581 Secondary hyperparathyroidism of renal origin: Secondary | ICD-10-CM | POA: Diagnosis not present

## 2020-10-29 DIAGNOSIS — N186 End stage renal disease: Secondary | ICD-10-CM | POA: Diagnosis not present

## 2020-10-30 DIAGNOSIS — Z992 Dependence on renal dialysis: Secondary | ICD-10-CM | POA: Diagnosis not present

## 2020-10-30 DIAGNOSIS — N186 End stage renal disease: Secondary | ICD-10-CM | POA: Diagnosis not present

## 2020-10-31 DIAGNOSIS — N2581 Secondary hyperparathyroidism of renal origin: Secondary | ICD-10-CM | POA: Diagnosis not present

## 2020-10-31 DIAGNOSIS — N186 End stage renal disease: Secondary | ICD-10-CM | POA: Diagnosis not present

## 2020-10-31 DIAGNOSIS — D631 Anemia in chronic kidney disease: Secondary | ICD-10-CM | POA: Diagnosis not present

## 2020-11-01 DIAGNOSIS — N2581 Secondary hyperparathyroidism of renal origin: Secondary | ICD-10-CM | POA: Diagnosis not present

## 2020-11-01 DIAGNOSIS — N186 End stage renal disease: Secondary | ICD-10-CM | POA: Diagnosis not present

## 2020-11-01 DIAGNOSIS — D631 Anemia in chronic kidney disease: Secondary | ICD-10-CM | POA: Diagnosis not present

## 2020-11-02 DIAGNOSIS — N186 End stage renal disease: Secondary | ICD-10-CM | POA: Diagnosis not present

## 2020-11-02 DIAGNOSIS — D631 Anemia in chronic kidney disease: Secondary | ICD-10-CM | POA: Diagnosis not present

## 2020-11-02 DIAGNOSIS — N2581 Secondary hyperparathyroidism of renal origin: Secondary | ICD-10-CM | POA: Diagnosis not present

## 2020-11-04 DIAGNOSIS — N2581 Secondary hyperparathyroidism of renal origin: Secondary | ICD-10-CM | POA: Diagnosis not present

## 2020-11-04 DIAGNOSIS — D631 Anemia in chronic kidney disease: Secondary | ICD-10-CM | POA: Diagnosis not present

## 2020-11-04 DIAGNOSIS — N186 End stage renal disease: Secondary | ICD-10-CM | POA: Diagnosis not present

## 2020-11-06 DIAGNOSIS — D631 Anemia in chronic kidney disease: Secondary | ICD-10-CM | POA: Diagnosis not present

## 2020-11-06 DIAGNOSIS — N186 End stage renal disease: Secondary | ICD-10-CM | POA: Diagnosis not present

## 2020-11-06 DIAGNOSIS — N2581 Secondary hyperparathyroidism of renal origin: Secondary | ICD-10-CM | POA: Diagnosis not present

## 2020-11-07 DIAGNOSIS — N2581 Secondary hyperparathyroidism of renal origin: Secondary | ICD-10-CM | POA: Diagnosis not present

## 2020-11-07 DIAGNOSIS — D631 Anemia in chronic kidney disease: Secondary | ICD-10-CM | POA: Diagnosis not present

## 2020-11-07 DIAGNOSIS — N186 End stage renal disease: Secondary | ICD-10-CM | POA: Diagnosis not present

## 2020-11-08 DIAGNOSIS — N186 End stage renal disease: Secondary | ICD-10-CM | POA: Diagnosis not present

## 2020-11-08 DIAGNOSIS — D631 Anemia in chronic kidney disease: Secondary | ICD-10-CM | POA: Diagnosis not present

## 2020-11-08 DIAGNOSIS — N2581 Secondary hyperparathyroidism of renal origin: Secondary | ICD-10-CM | POA: Diagnosis not present

## 2020-11-09 DIAGNOSIS — D631 Anemia in chronic kidney disease: Secondary | ICD-10-CM | POA: Diagnosis not present

## 2020-11-09 DIAGNOSIS — N186 End stage renal disease: Secondary | ICD-10-CM | POA: Diagnosis not present

## 2020-11-09 DIAGNOSIS — N2581 Secondary hyperparathyroidism of renal origin: Secondary | ICD-10-CM | POA: Diagnosis not present

## 2020-11-10 DIAGNOSIS — N2581 Secondary hyperparathyroidism of renal origin: Secondary | ICD-10-CM | POA: Diagnosis not present

## 2020-11-10 DIAGNOSIS — D631 Anemia in chronic kidney disease: Secondary | ICD-10-CM | POA: Diagnosis not present

## 2020-11-10 DIAGNOSIS — N186 End stage renal disease: Secondary | ICD-10-CM | POA: Diagnosis not present

## 2020-11-12 DIAGNOSIS — N2581 Secondary hyperparathyroidism of renal origin: Secondary | ICD-10-CM | POA: Diagnosis not present

## 2020-11-12 DIAGNOSIS — N186 End stage renal disease: Secondary | ICD-10-CM | POA: Diagnosis not present

## 2020-11-12 DIAGNOSIS — D631 Anemia in chronic kidney disease: Secondary | ICD-10-CM | POA: Diagnosis not present

## 2020-11-13 DIAGNOSIS — N186 End stage renal disease: Secondary | ICD-10-CM | POA: Diagnosis not present

## 2020-11-13 DIAGNOSIS — N2581 Secondary hyperparathyroidism of renal origin: Secondary | ICD-10-CM | POA: Diagnosis not present

## 2020-11-13 DIAGNOSIS — D631 Anemia in chronic kidney disease: Secondary | ICD-10-CM | POA: Diagnosis not present

## 2020-11-14 DIAGNOSIS — C73 Malignant neoplasm of thyroid gland: Secondary | ICD-10-CM | POA: Diagnosis not present

## 2020-11-14 DIAGNOSIS — N186 End stage renal disease: Secondary | ICD-10-CM | POA: Diagnosis not present

## 2020-11-14 DIAGNOSIS — N2581 Secondary hyperparathyroidism of renal origin: Secondary | ICD-10-CM | POA: Diagnosis not present

## 2020-11-14 DIAGNOSIS — E890 Postprocedural hypothyroidism: Secondary | ICD-10-CM | POA: Diagnosis not present

## 2020-11-14 DIAGNOSIS — D631 Anemia in chronic kidney disease: Secondary | ICD-10-CM | POA: Diagnosis not present

## 2020-11-15 DIAGNOSIS — N186 End stage renal disease: Secondary | ICD-10-CM | POA: Diagnosis not present

## 2020-11-15 DIAGNOSIS — N2581 Secondary hyperparathyroidism of renal origin: Secondary | ICD-10-CM | POA: Diagnosis not present

## 2020-11-15 DIAGNOSIS — D631 Anemia in chronic kidney disease: Secondary | ICD-10-CM | POA: Diagnosis not present

## 2020-11-16 DIAGNOSIS — D631 Anemia in chronic kidney disease: Secondary | ICD-10-CM | POA: Diagnosis not present

## 2020-11-16 DIAGNOSIS — N2581 Secondary hyperparathyroidism of renal origin: Secondary | ICD-10-CM | POA: Diagnosis not present

## 2020-11-16 DIAGNOSIS — N186 End stage renal disease: Secondary | ICD-10-CM | POA: Diagnosis not present

## 2020-11-17 DIAGNOSIS — D631 Anemia in chronic kidney disease: Secondary | ICD-10-CM | POA: Diagnosis not present

## 2020-11-17 DIAGNOSIS — N186 End stage renal disease: Secondary | ICD-10-CM | POA: Diagnosis not present

## 2020-11-17 DIAGNOSIS — N2581 Secondary hyperparathyroidism of renal origin: Secondary | ICD-10-CM | POA: Diagnosis not present

## 2020-11-18 DIAGNOSIS — D631 Anemia in chronic kidney disease: Secondary | ICD-10-CM | POA: Diagnosis not present

## 2020-11-18 DIAGNOSIS — N2581 Secondary hyperparathyroidism of renal origin: Secondary | ICD-10-CM | POA: Diagnosis not present

## 2020-11-18 DIAGNOSIS — N186 End stage renal disease: Secondary | ICD-10-CM | POA: Diagnosis not present

## 2020-11-20 DIAGNOSIS — N2581 Secondary hyperparathyroidism of renal origin: Secondary | ICD-10-CM | POA: Diagnosis not present

## 2020-11-20 DIAGNOSIS — D631 Anemia in chronic kidney disease: Secondary | ICD-10-CM | POA: Diagnosis not present

## 2020-11-20 DIAGNOSIS — N186 End stage renal disease: Secondary | ICD-10-CM | POA: Diagnosis not present

## 2020-11-21 DIAGNOSIS — N2581 Secondary hyperparathyroidism of renal origin: Secondary | ICD-10-CM | POA: Diagnosis not present

## 2020-11-21 DIAGNOSIS — D631 Anemia in chronic kidney disease: Secondary | ICD-10-CM | POA: Diagnosis not present

## 2020-11-21 DIAGNOSIS — N186 End stage renal disease: Secondary | ICD-10-CM | POA: Diagnosis not present

## 2020-11-22 DIAGNOSIS — N2581 Secondary hyperparathyroidism of renal origin: Secondary | ICD-10-CM | POA: Diagnosis not present

## 2020-11-22 DIAGNOSIS — D631 Anemia in chronic kidney disease: Secondary | ICD-10-CM | POA: Diagnosis not present

## 2020-11-22 DIAGNOSIS — N186 End stage renal disease: Secondary | ICD-10-CM | POA: Diagnosis not present

## 2020-11-23 DIAGNOSIS — N186 End stage renal disease: Secondary | ICD-10-CM | POA: Diagnosis not present

## 2020-11-23 DIAGNOSIS — N2581 Secondary hyperparathyroidism of renal origin: Secondary | ICD-10-CM | POA: Diagnosis not present

## 2020-11-23 DIAGNOSIS — D631 Anemia in chronic kidney disease: Secondary | ICD-10-CM | POA: Diagnosis not present

## 2020-11-24 DIAGNOSIS — N2581 Secondary hyperparathyroidism of renal origin: Secondary | ICD-10-CM | POA: Diagnosis not present

## 2020-11-24 DIAGNOSIS — D631 Anemia in chronic kidney disease: Secondary | ICD-10-CM | POA: Diagnosis not present

## 2020-11-24 DIAGNOSIS — N186 End stage renal disease: Secondary | ICD-10-CM | POA: Diagnosis not present

## 2020-11-25 DIAGNOSIS — D631 Anemia in chronic kidney disease: Secondary | ICD-10-CM | POA: Diagnosis not present

## 2020-11-25 DIAGNOSIS — N186 End stage renal disease: Secondary | ICD-10-CM | POA: Diagnosis not present

## 2020-11-25 DIAGNOSIS — N2581 Secondary hyperparathyroidism of renal origin: Secondary | ICD-10-CM | POA: Diagnosis not present

## 2020-11-27 DIAGNOSIS — D631 Anemia in chronic kidney disease: Secondary | ICD-10-CM | POA: Diagnosis not present

## 2020-11-27 DIAGNOSIS — N2581 Secondary hyperparathyroidism of renal origin: Secondary | ICD-10-CM | POA: Diagnosis not present

## 2020-11-27 DIAGNOSIS — N186 End stage renal disease: Secondary | ICD-10-CM | POA: Diagnosis not present

## 2020-11-28 DIAGNOSIS — N2581 Secondary hyperparathyroidism of renal origin: Secondary | ICD-10-CM | POA: Diagnosis not present

## 2020-11-28 DIAGNOSIS — D631 Anemia in chronic kidney disease: Secondary | ICD-10-CM | POA: Diagnosis not present

## 2020-11-28 DIAGNOSIS — N186 End stage renal disease: Secondary | ICD-10-CM | POA: Diagnosis not present

## 2020-11-29 DIAGNOSIS — N2581 Secondary hyperparathyroidism of renal origin: Secondary | ICD-10-CM | POA: Diagnosis not present

## 2020-11-29 DIAGNOSIS — D631 Anemia in chronic kidney disease: Secondary | ICD-10-CM | POA: Diagnosis not present

## 2020-11-29 DIAGNOSIS — N186 End stage renal disease: Secondary | ICD-10-CM | POA: Diagnosis not present

## 2020-11-30 DIAGNOSIS — D631 Anemia in chronic kidney disease: Secondary | ICD-10-CM | POA: Diagnosis not present

## 2020-11-30 DIAGNOSIS — N2581 Secondary hyperparathyroidism of renal origin: Secondary | ICD-10-CM | POA: Diagnosis not present

## 2020-11-30 DIAGNOSIS — N186 End stage renal disease: Secondary | ICD-10-CM | POA: Diagnosis not present

## 2020-11-30 DIAGNOSIS — Z992 Dependence on renal dialysis: Secondary | ICD-10-CM | POA: Diagnosis not present

## 2020-12-01 DIAGNOSIS — N2581 Secondary hyperparathyroidism of renal origin: Secondary | ICD-10-CM | POA: Diagnosis not present

## 2020-12-01 DIAGNOSIS — D631 Anemia in chronic kidney disease: Secondary | ICD-10-CM | POA: Diagnosis not present

## 2020-12-01 DIAGNOSIS — N186 End stage renal disease: Secondary | ICD-10-CM | POA: Diagnosis not present

## 2020-12-02 DIAGNOSIS — N186 End stage renal disease: Secondary | ICD-10-CM | POA: Diagnosis not present

## 2020-12-02 DIAGNOSIS — N2581 Secondary hyperparathyroidism of renal origin: Secondary | ICD-10-CM | POA: Diagnosis not present

## 2020-12-02 DIAGNOSIS — D631 Anemia in chronic kidney disease: Secondary | ICD-10-CM | POA: Diagnosis not present

## 2020-12-04 DIAGNOSIS — N2581 Secondary hyperparathyroidism of renal origin: Secondary | ICD-10-CM | POA: Diagnosis not present

## 2020-12-04 DIAGNOSIS — N186 End stage renal disease: Secondary | ICD-10-CM | POA: Diagnosis not present

## 2020-12-04 DIAGNOSIS — D631 Anemia in chronic kidney disease: Secondary | ICD-10-CM | POA: Diagnosis not present

## 2020-12-06 DIAGNOSIS — D631 Anemia in chronic kidney disease: Secondary | ICD-10-CM | POA: Diagnosis not present

## 2020-12-06 DIAGNOSIS — E890 Postprocedural hypothyroidism: Secondary | ICD-10-CM | POA: Diagnosis not present

## 2020-12-06 DIAGNOSIS — E039 Hypothyroidism, unspecified: Secondary | ICD-10-CM | POA: Diagnosis not present

## 2020-12-06 DIAGNOSIS — N186 End stage renal disease: Secondary | ICD-10-CM | POA: Diagnosis not present

## 2020-12-06 DIAGNOSIS — Z7682 Awaiting organ transplant status: Secondary | ICD-10-CM | POA: Diagnosis not present

## 2020-12-06 DIAGNOSIS — C73 Malignant neoplasm of thyroid gland: Secondary | ICD-10-CM | POA: Diagnosis not present

## 2020-12-06 DIAGNOSIS — N2581 Secondary hyperparathyroidism of renal origin: Secondary | ICD-10-CM | POA: Diagnosis not present

## 2020-12-07 DIAGNOSIS — N186 End stage renal disease: Secondary | ICD-10-CM | POA: Diagnosis not present

## 2020-12-07 DIAGNOSIS — D631 Anemia in chronic kidney disease: Secondary | ICD-10-CM | POA: Diagnosis not present

## 2020-12-07 DIAGNOSIS — N2581 Secondary hyperparathyroidism of renal origin: Secondary | ICD-10-CM | POA: Diagnosis not present

## 2020-12-08 DIAGNOSIS — D631 Anemia in chronic kidney disease: Secondary | ICD-10-CM | POA: Diagnosis not present

## 2020-12-08 DIAGNOSIS — N186 End stage renal disease: Secondary | ICD-10-CM | POA: Diagnosis not present

## 2020-12-08 DIAGNOSIS — N2581 Secondary hyperparathyroidism of renal origin: Secondary | ICD-10-CM | POA: Diagnosis not present

## 2020-12-11 DIAGNOSIS — N186 End stage renal disease: Secondary | ICD-10-CM | POA: Diagnosis not present

## 2020-12-11 DIAGNOSIS — D631 Anemia in chronic kidney disease: Secondary | ICD-10-CM | POA: Diagnosis not present

## 2020-12-11 DIAGNOSIS — N2581 Secondary hyperparathyroidism of renal origin: Secondary | ICD-10-CM | POA: Diagnosis not present

## 2020-12-14 DIAGNOSIS — N186 End stage renal disease: Secondary | ICD-10-CM | POA: Diagnosis not present

## 2020-12-14 DIAGNOSIS — N2581 Secondary hyperparathyroidism of renal origin: Secondary | ICD-10-CM | POA: Diagnosis not present

## 2020-12-14 DIAGNOSIS — D631 Anemia in chronic kidney disease: Secondary | ICD-10-CM | POA: Diagnosis not present

## 2020-12-15 ENCOUNTER — Telehealth (HOSPITAL_COMMUNITY): Payer: Self-pay | Admitting: *Deleted

## 2020-12-15 DIAGNOSIS — N2581 Secondary hyperparathyroidism of renal origin: Secondary | ICD-10-CM | POA: Diagnosis not present

## 2020-12-15 DIAGNOSIS — D631 Anemia in chronic kidney disease: Secondary | ICD-10-CM | POA: Diagnosis not present

## 2020-12-15 DIAGNOSIS — N186 End stage renal disease: Secondary | ICD-10-CM | POA: Diagnosis not present

## 2020-12-15 NOTE — Telephone Encounter (Signed)
Forms dropped off by patient son 12/15/2020 requesting staff to give forms to Dr. Harrington Challenger and left.   First page of the form states stated  Date: November 22, 2020 Claim #: [id number] Sincerely, [employer] Claims Department Toll-Free: [phone] Fax: [phone] [facility]   When forms were dropped off, son did not give any information or details about the form and did not say who is requesting the forms to be completed.

## 2020-12-17 DIAGNOSIS — N2581 Secondary hyperparathyroidism of renal origin: Secondary | ICD-10-CM | POA: Diagnosis not present

## 2020-12-17 DIAGNOSIS — D631 Anemia in chronic kidney disease: Secondary | ICD-10-CM | POA: Diagnosis not present

## 2020-12-17 DIAGNOSIS — N186 End stage renal disease: Secondary | ICD-10-CM | POA: Diagnosis not present

## 2020-12-18 DIAGNOSIS — N186 End stage renal disease: Secondary | ICD-10-CM | POA: Diagnosis not present

## 2020-12-18 DIAGNOSIS — N2581 Secondary hyperparathyroidism of renal origin: Secondary | ICD-10-CM | POA: Diagnosis not present

## 2020-12-18 DIAGNOSIS — D631 Anemia in chronic kidney disease: Secondary | ICD-10-CM | POA: Diagnosis not present

## 2020-12-19 DIAGNOSIS — N2581 Secondary hyperparathyroidism of renal origin: Secondary | ICD-10-CM | POA: Diagnosis not present

## 2020-12-19 DIAGNOSIS — N186 End stage renal disease: Secondary | ICD-10-CM | POA: Diagnosis not present

## 2020-12-19 DIAGNOSIS — D631 Anemia in chronic kidney disease: Secondary | ICD-10-CM | POA: Diagnosis not present

## 2020-12-20 DIAGNOSIS — N2581 Secondary hyperparathyroidism of renal origin: Secondary | ICD-10-CM | POA: Diagnosis not present

## 2020-12-20 DIAGNOSIS — D631 Anemia in chronic kidney disease: Secondary | ICD-10-CM | POA: Diagnosis not present

## 2020-12-20 DIAGNOSIS — N186 End stage renal disease: Secondary | ICD-10-CM | POA: Diagnosis not present

## 2020-12-21 ENCOUNTER — Telehealth (INDEPENDENT_AMBULATORY_CARE_PROVIDER_SITE_OTHER): Payer: Medicare PPO | Admitting: Psychiatry

## 2020-12-21 ENCOUNTER — Encounter (HOSPITAL_COMMUNITY): Payer: Self-pay | Admitting: Psychiatry

## 2020-12-21 ENCOUNTER — Other Ambulatory Visit: Payer: Self-pay

## 2020-12-21 DIAGNOSIS — N2581 Secondary hyperparathyroidism of renal origin: Secondary | ICD-10-CM | POA: Diagnosis not present

## 2020-12-21 DIAGNOSIS — N186 End stage renal disease: Secondary | ICD-10-CM | POA: Diagnosis not present

## 2020-12-21 DIAGNOSIS — F322 Major depressive disorder, single episode, severe without psychotic features: Secondary | ICD-10-CM | POA: Diagnosis not present

## 2020-12-21 DIAGNOSIS — F411 Generalized anxiety disorder: Secondary | ICD-10-CM | POA: Diagnosis not present

## 2020-12-21 DIAGNOSIS — D631 Anemia in chronic kidney disease: Secondary | ICD-10-CM | POA: Diagnosis not present

## 2020-12-21 MED ORDER — ALPRAZOLAM 1 MG PO TABS: 1.0000 mg | ORAL_TABLET | Freq: Two times a day (BID) | ORAL | 2 refills | Status: DC | PRN

## 2020-12-21 MED ORDER — TRAZODONE HCL 50 MG PO TABS: 50.0000 mg | ORAL_TABLET | Freq: Every day | ORAL | 2 refills | Status: DC

## 2020-12-21 MED ORDER — BUPROPION HCL ER (XL) 300 MG PO TB24: 300.0000 mg | ORAL_TABLET | ORAL | 2 refills | Status: DC

## 2020-12-21 MED ORDER — SERTRALINE HCL 100 MG PO TABS: 100.0000 mg | ORAL_TABLET | Freq: Every day | ORAL | 2 refills | Status: DC

## 2020-12-21 NOTE — Progress Notes (Signed)
Virtual Visit via Video Note  I connected with April Ward on 12/21/20 at  9:40 AM EDT by a video enabled telemedicine application and verified that I am speaking with the correct person using two identifiers.  Location: Patient: home Provider: home office   I discussed the limitations of evaluation and management by telemedicine and the availability of in person appointments. The patient expressed understanding and agreed to proceed.     I discussed the assessment and treatment plan with the patient. The patient was provided an opportunity to ask questions and all were answered. The patient agreed with the plan and demonstrated an understanding of the instructions.   The patient was advised to call back or seek an in-person evaluation if the symptoms worsen or if the condition fails to improve as anticipated.  I provided 20 minutes of non-face-to-face time during this encounter.   [name], MD  [facility] MD/PA/NP OP Progress Note  12/21/2020 10:11 AM April Ward  MRN:  [id number]  Chief Complaint:  Chief Complaint   Depression; Anxiety; Follow-up    HPI: This patient is a 60 year old widowed black female who lives with her son and asked in [place].  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  The patient returns for follow-up after 4 months.  She is still awaiting a kidney transplant and is doing home dialysis every night.  She states that lately she is having trouble getting to sleep and is very anxious before starting dialysis.  When she lays down she feels like she cannot breathe.  She has not been taking the Xanax because she thinks the transplant team disapproves of this.  I told her that 1 mg of Xanax is not going to make a difference to them either way.  She really needs to rest and get her sleep.  She agrees to try it.  The trazodone also helps.  The patient's labs indicate that her recent hemoglobin was only 9.7.  They want her to get some sort of  injection to increase her red blood cell production but she is afraid to get it due to blood clots reported from this drug.  She is going to try to switch over to [facility] to see if she could get an iron infusion.  Interestingly she does not feel particularly tired.  She does have twitching in her hands and legs at night. she states that her mood is generally good and she denies serious depression or suicidal ideation. Visit Diagnosis:    ICD-10-CM   1. Major depressive disorder, single episode, severe without psychotic features ([place])  F32.2     2. GAD (generalized anxiety disorder)  F41.1       Past Psychiatric History: Long-term outpatient treatment  Past Medical History:  Past Medical History:  Diagnosis Date   Anxiety    Arthritis    Depression    Diabetes mellitus without complication (HCC)    GERD (gastroesophageal reflux disease)    Gout    Heart murmur    Hypertension    Renal cancer [facility])    Renal disease 10/2012   Renal insufficiency     Past Surgical History:  Procedure Laterality Date   CESAREAN SECTION  2979;8921   CHONDROPLASTY Right 08/22/2012   Procedure: CHONDROPLASTY;  Surgeon: Carole Civil, MD;  Location: [facility];  Service: Orthopedics;  Laterality: Right;   COLONOSCOPY WITH PROPOFOL N/A 03/16/2014   Procedure: COLONOSCOPY WITH PROPOFOL (at cecum 1023, total  withdrawal time=9 minutes);  Surgeon: Danie Binder, MD;  Location: [facility];  Service: Endoscopy;  Laterality: N/A;   GASTRIC BYPASS     KNEE ARTHROSCOPY WITH MEDIAL MENISECTOMY Right 08/22/2012   Procedure: KNEE ARTHROSCOPY WITH PARTIAL MEDIAL MENISECTOMY;  Surgeon: Carole Civil, MD;  Location: [facility];  Service: Orthopedics;  Laterality: Right;   PARTIAL NEPHRECTOMY  Dec 2014   left   TENDON REPAIR      Family Psychiatric History: see below  Family History:  Family History  Problem Relation Age of Onset   Heart attack Mother    Heart attack Father    Depression Paternal Aunt    Alcohol  abuse Maternal Uncle    Colon cancer Maternal Aunt    Colon cancer Maternal Uncle     Social History:  Social History   Socioeconomic History   Marital status: Widowed    Spouse name: Not on file   Number of children: Not on file   Years of education: Not on file   Highest education level: Not on file  Occupational History   Occupation: [employer]    Employer: [employer]  Tobacco Use   Smoking status: Never   Smokeless tobacco: Never  Substance and Sexual Activity   Alcohol use: No    Alcohol/week: 0.0 standard drinks   Drug use: No   Sexual activity: Not on file  Other Topics Concern   Not on file  Social History Narrative   Not on file   Social Determinants of Health   Financial Resource Strain: Not on file  Food Insecurity: Not on file  Transportation Needs: Not on file  Physical Activity: Not on file  Stress: Not on file  Social Connections: Not on file    Allergies:  Allergies  Allergen Reactions   Skelaxin [Metaxalone] Hives and Rash    Metabolic Disorder Labs: No results found for: HGBA1C, MPG No results found for: PROLACTIN Lab Results  Component Value Date   CHOL 215 (H) 03/11/2014   TRIG 288 (H) 03/11/2014   HDL 33 (L) 03/11/2014   CHOLHDL 6.5 03/11/2014   VLDL 58 (H) 03/11/2014   LDLCALC 124 (H) 03/11/2014   No results found for: TSH  Therapeutic Level Labs: No results found for: LITHIUM No results found for: VALPROATE No components found for:  CBMZ  Current Medications: Current Outpatient Medications  Medication Sig Dispense Refill   ALPRAZolam (XANAX) 1 MG tablet Take 1 tablet (1 mg total) by mouth 2 (two) times daily as needed for anxiety. 180 tablet 2   amLODipine (NORVASC) 10 MG tablet Take 1 tablet by mouth daily.     buPROPion (WELLBUTRIN XL) 300 MG 24 hr tablet Take 1 tablet (300 mg total) by mouth every morning. 90 tablet 2   calcitRIOL (ROCALTROL) 0.25 MCG capsule Take 1 capsule by mouth. 6 times weekly -  Monday thru Saturday     hydrALAZINE (APRESOLINE) 100 MG tablet TAKE 1 TABLET THREE TIMES DAILY 270 tablet 1   labetalol (NORMODYNE) 100 MG tablet Take 1 tablet (100 mg total) by mouth 2 (two) times daily. 30 tablet 11   lactulose (CONSTULOSE) 10 GM/15ML solution Take 10 mLs by mouth 2 (two) times daily as needed for constipation.     levothyroxine (SYNTHROID, LEVOTHROID) 200 MCG tablet Take 1 tablet by mouth daily. X 6 days of the week & 357mcg on one day of the week     losartan (COZAAR) 100 MG tablet Take 1 tablet (100  mg total) by mouth daily. 90 tablet 3   meclizine (ANTIVERT) 25 MG tablet Take 1 tablet (25 mg total) by mouth every 8 (eight) hours as needed for dizziness. 90 tablet 0   multivitamin (RENA-VIT) TABS tablet Take 1 tablet by mouth daily.     sertraline (ZOLOFT) 100 MG tablet Take 1 tablet (100 mg total) by mouth daily. 90 tablet 2   sevelamer carbonate (RENVELA) 800 MG tablet Take 800 mg by mouth 3 (three) times daily. Does 400mg  twice a day with snacks     Thiamine HCl (VITAMIN B1 PO) Take 1 tablet by mouth 3 (three) times a week.     tiZANidine (ZANAFLEX) 4 MG tablet TAKE 1 TABLET BY MOUTH EVERY DAY 30 tablet 1   traZODone (DESYREL) 50 MG tablet Take 1 tablet (50 mg total) by mouth at bedtime. 90 tablet 2   vitamin B-12 (CYANOCOBALAMIN) 500 MCG tablet Take 500 mcg by mouth daily.     No current facility-administered medications for this visit.     Musculoskeletal: Strength & Muscle Tone: within normal limits Gait & Station: normal Patient leans: N/A  Psychiatric Specialty Exam: Review of Systems  Psychiatric/Behavioral:  Positive for sleep disturbance. The patient is nervous/anxious.   All other systems reviewed and are negative.  There were no vitals taken for this visit.There is no height or weight on file to calculate BMI.  General Appearance: Casual, Neat, and Well Groomed  Eye Contact:  Good  Speech:  Clear and Coherent  Volume:  Normal  Mood:  Anxious   Affect:  Appropriate and Congruent  Thought Process:  Goal Directed  Orientation:  Full (Time, Place, and Person)  Thought Content: Rumination   Suicidal Thoughts:  No  Homicidal Thoughts:  No  Memory:  Immediate;   Good Recent;   Good Remote;   Good  Judgement:  Good  Insight:  Good  Psychomotor Activity:  Normal  Concentration:  Concentration: Good and Attention Span: Good  Recall:  Good  Fund of Knowledge: Good  Language: Good  Akathisia:  No  Handed:  Right  AIMS (if indicated): not done  Assets:  Communication Skills Desire for Improvement Resilience Social Support Talents/Skills  ADL's:  Intact  Cognition: WNL  Sleep:  Poor   Screenings: PHQ2-9    Flowsheet Row Video Visit from 12/21/2020 in [place] [facility] Video Visit from 08/22/2020 in [place] Video Visit from 07/14/2020 in [place] [facility]  PHQ-2 Total Score 0 0 1      Flowsheet Row Video Visit from 12/21/2020 in [place] [facility] Video Visit from 08/22/2020 in [place] [facility] Video Visit from 07/14/2020 in [place] [facility]  C-SSRS RISK CATEGORY No Risk No Risk No Risk        Assessment and Plan: Patient is a 60 year old female with a history of end-stage renal disease depression anxiety.  She has been wary about taking the Xanax but I urged her to take it at night so she can get proper rest.  She also needs to get her iron corrected and she is going to work on this with hematology.  For now she will continue Xanax 1 mg up to twice daily as needed for anxiety, trazodone 50 mg at night for sleep, Zoloft 100 mg daily for depression as well as Wellbutrin XL 300 mg daily for depression.  She will return to see me in 3 months  [name], MD 12/21/2020, 10:11 AM

## 2020-12-22 DIAGNOSIS — N186 End stage renal disease: Secondary | ICD-10-CM | POA: Diagnosis not present

## 2020-12-22 DIAGNOSIS — N2581 Secondary hyperparathyroidism of renal origin: Secondary | ICD-10-CM | POA: Diagnosis not present

## 2020-12-22 DIAGNOSIS — D631 Anemia in chronic kidney disease: Secondary | ICD-10-CM | POA: Diagnosis not present

## 2020-12-23 DIAGNOSIS — N186 End stage renal disease: Secondary | ICD-10-CM | POA: Diagnosis not present

## 2020-12-23 DIAGNOSIS — N2581 Secondary hyperparathyroidism of renal origin: Secondary | ICD-10-CM | POA: Diagnosis not present

## 2020-12-23 DIAGNOSIS — D631 Anemia in chronic kidney disease: Secondary | ICD-10-CM | POA: Diagnosis not present

## 2020-12-25 DIAGNOSIS — N2581 Secondary hyperparathyroidism of renal origin: Secondary | ICD-10-CM | POA: Diagnosis not present

## 2020-12-25 DIAGNOSIS — D631 Anemia in chronic kidney disease: Secondary | ICD-10-CM | POA: Diagnosis not present

## 2020-12-25 DIAGNOSIS — N186 End stage renal disease: Secondary | ICD-10-CM | POA: Diagnosis not present

## 2020-12-26 DIAGNOSIS — N2581 Secondary hyperparathyroidism of renal origin: Secondary | ICD-10-CM | POA: Diagnosis not present

## 2020-12-26 DIAGNOSIS — N186 End stage renal disease: Secondary | ICD-10-CM | POA: Diagnosis not present

## 2020-12-26 DIAGNOSIS — D631 Anemia in chronic kidney disease: Secondary | ICD-10-CM | POA: Diagnosis not present

## 2020-12-27 DIAGNOSIS — D631 Anemia in chronic kidney disease: Secondary | ICD-10-CM | POA: Diagnosis not present

## 2020-12-27 DIAGNOSIS — N2581 Secondary hyperparathyroidism of renal origin: Secondary | ICD-10-CM | POA: Diagnosis not present

## 2020-12-27 DIAGNOSIS — N186 End stage renal disease: Secondary | ICD-10-CM | POA: Diagnosis not present

## 2020-12-28 DIAGNOSIS — N2581 Secondary hyperparathyroidism of renal origin: Secondary | ICD-10-CM | POA: Diagnosis not present

## 2020-12-28 DIAGNOSIS — N186 End stage renal disease: Secondary | ICD-10-CM | POA: Diagnosis not present

## 2020-12-28 DIAGNOSIS — D631 Anemia in chronic kidney disease: Secondary | ICD-10-CM | POA: Diagnosis not present

## 2020-12-29 DIAGNOSIS — N186 End stage renal disease: Secondary | ICD-10-CM | POA: Diagnosis not present

## 2020-12-29 DIAGNOSIS — D631 Anemia in chronic kidney disease: Secondary | ICD-10-CM | POA: Diagnosis not present

## 2020-12-29 DIAGNOSIS — N2581 Secondary hyperparathyroidism of renal origin: Secondary | ICD-10-CM | POA: Diagnosis not present

## 2020-12-30 DIAGNOSIS — N2581 Secondary hyperparathyroidism of renal origin: Secondary | ICD-10-CM | POA: Diagnosis not present

## 2020-12-30 DIAGNOSIS — D631 Anemia in chronic kidney disease: Secondary | ICD-10-CM | POA: Diagnosis not present

## 2020-12-30 DIAGNOSIS — N186 End stage renal disease: Secondary | ICD-10-CM | POA: Diagnosis not present

## 2020-12-30 DIAGNOSIS — Z992 Dependence on renal dialysis: Secondary | ICD-10-CM | POA: Diagnosis not present

## 2020-12-31 DIAGNOSIS — Z992 Dependence on renal dialysis: Secondary | ICD-10-CM | POA: Diagnosis not present

## 2020-12-31 DIAGNOSIS — N186 End stage renal disease: Secondary | ICD-10-CM | POA: Diagnosis not present

## 2021-01-01 DIAGNOSIS — D631 Anemia in chronic kidney disease: Secondary | ICD-10-CM | POA: Diagnosis not present

## 2021-01-01 DIAGNOSIS — Z23 Encounter for immunization: Secondary | ICD-10-CM | POA: Diagnosis not present

## 2021-01-01 DIAGNOSIS — Z992 Dependence on renal dialysis: Secondary | ICD-10-CM | POA: Diagnosis not present

## 2021-01-01 DIAGNOSIS — D509 Iron deficiency anemia, unspecified: Secondary | ICD-10-CM | POA: Diagnosis not present

## 2021-01-01 DIAGNOSIS — N186 End stage renal disease: Secondary | ICD-10-CM | POA: Diagnosis not present

## 2021-01-01 DIAGNOSIS — N2581 Secondary hyperparathyroidism of renal origin: Secondary | ICD-10-CM | POA: Diagnosis not present

## 2021-01-02 DIAGNOSIS — D631 Anemia in chronic kidney disease: Secondary | ICD-10-CM | POA: Diagnosis not present

## 2021-01-02 DIAGNOSIS — N2581 Secondary hyperparathyroidism of renal origin: Secondary | ICD-10-CM | POA: Diagnosis not present

## 2021-01-02 DIAGNOSIS — N186 End stage renal disease: Secondary | ICD-10-CM | POA: Diagnosis not present

## 2021-01-02 DIAGNOSIS — Z992 Dependence on renal dialysis: Secondary | ICD-10-CM | POA: Diagnosis not present

## 2021-01-02 DIAGNOSIS — Z23 Encounter for immunization: Secondary | ICD-10-CM | POA: Diagnosis not present

## 2021-01-02 DIAGNOSIS — D509 Iron deficiency anemia, unspecified: Secondary | ICD-10-CM | POA: Diagnosis not present

## 2021-01-03 DIAGNOSIS — D509 Iron deficiency anemia, unspecified: Secondary | ICD-10-CM | POA: Diagnosis not present

## 2021-01-03 DIAGNOSIS — N186 End stage renal disease: Secondary | ICD-10-CM | POA: Diagnosis not present

## 2021-01-03 DIAGNOSIS — Z992 Dependence on renal dialysis: Secondary | ICD-10-CM | POA: Diagnosis not present

## 2021-01-03 DIAGNOSIS — N2581 Secondary hyperparathyroidism of renal origin: Secondary | ICD-10-CM | POA: Diagnosis not present

## 2021-01-03 DIAGNOSIS — Z23 Encounter for immunization: Secondary | ICD-10-CM | POA: Diagnosis not present

## 2021-01-03 DIAGNOSIS — D631 Anemia in chronic kidney disease: Secondary | ICD-10-CM | POA: Diagnosis not present

## 2021-01-04 DIAGNOSIS — N2581 Secondary hyperparathyroidism of renal origin: Secondary | ICD-10-CM | POA: Diagnosis not present

## 2021-01-04 DIAGNOSIS — Z23 Encounter for immunization: Secondary | ICD-10-CM | POA: Diagnosis not present

## 2021-01-04 DIAGNOSIS — D631 Anemia in chronic kidney disease: Secondary | ICD-10-CM | POA: Diagnosis not present

## 2021-01-04 DIAGNOSIS — Z992 Dependence on renal dialysis: Secondary | ICD-10-CM | POA: Diagnosis not present

## 2021-01-04 DIAGNOSIS — D509 Iron deficiency anemia, unspecified: Secondary | ICD-10-CM | POA: Diagnosis not present

## 2021-01-04 DIAGNOSIS — N186 End stage renal disease: Secondary | ICD-10-CM | POA: Diagnosis not present

## 2021-01-04 IMAGING — MG DIGITAL SCREENING BILATERAL MAMMOGRAM WITH TOMO AND CAD
6 of 10 series · 6 of 30 positions shown · non-contrast
Comparison: Previous exam(s).

CLINICAL DATA: Screening.

EXAM:
DIGITAL SCREENING BILATERAL MAMMOGRAM WITH TOMO AND CAD

[R MLO synth-2D]
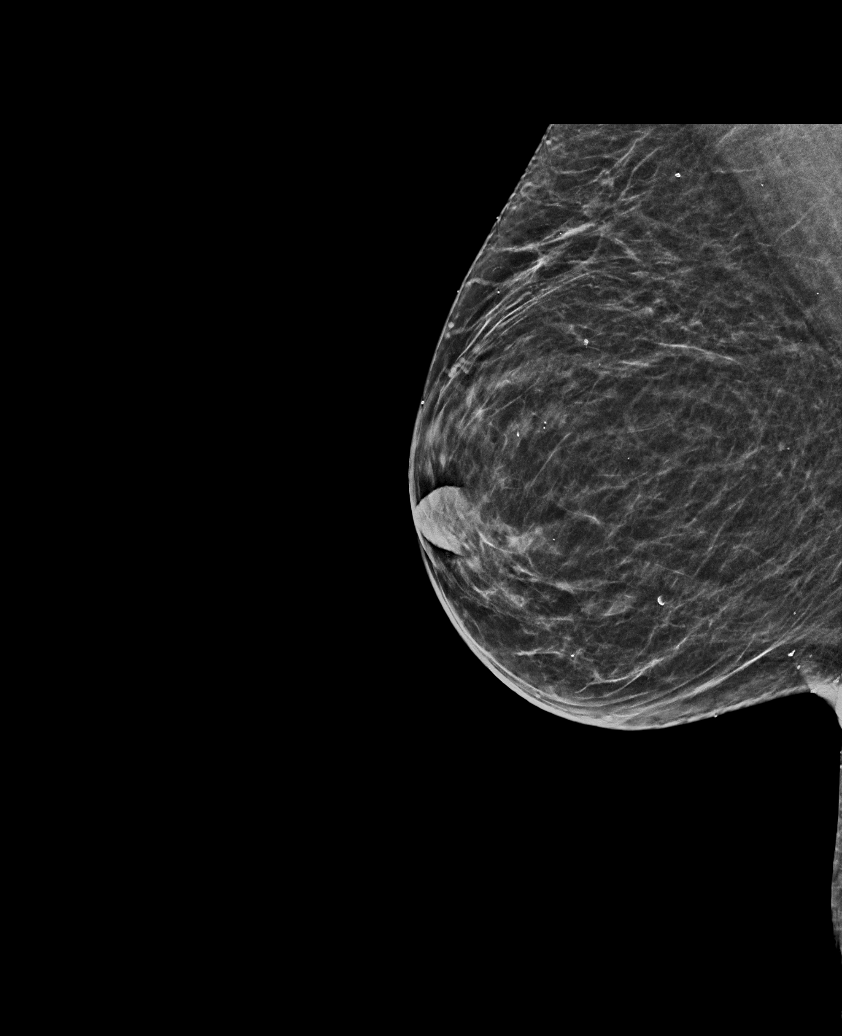

[L CC synth-2D]
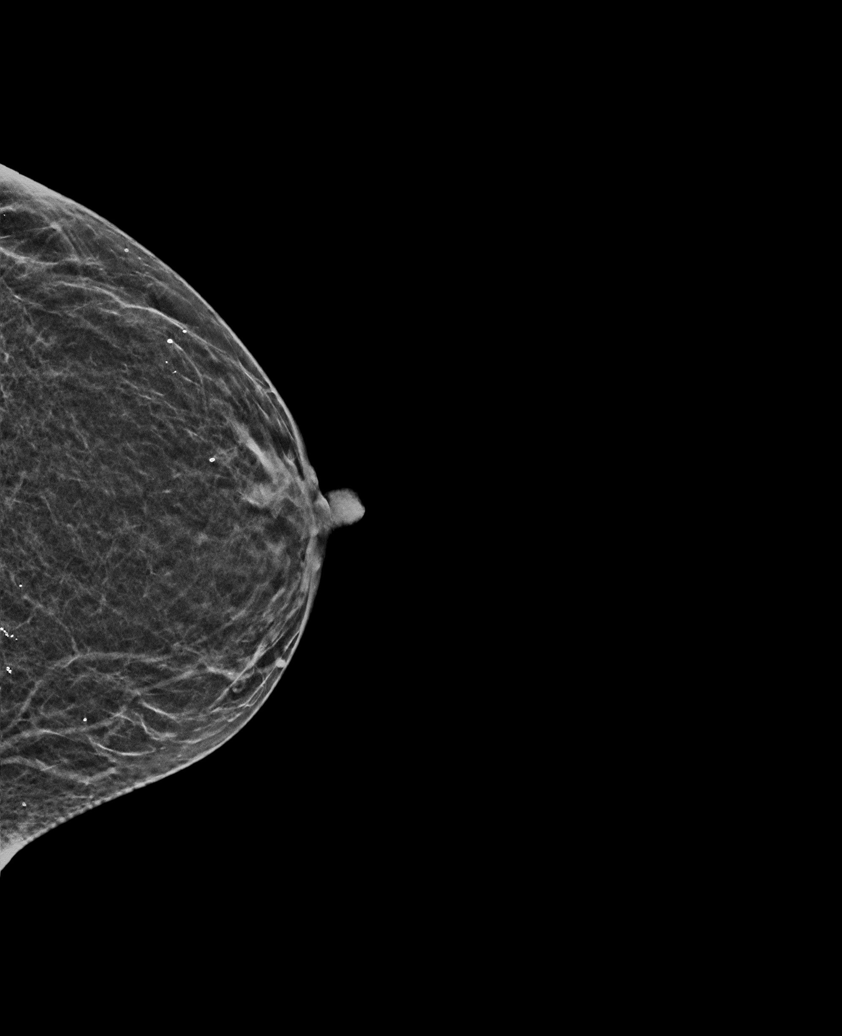

[L MLO synth-2D]
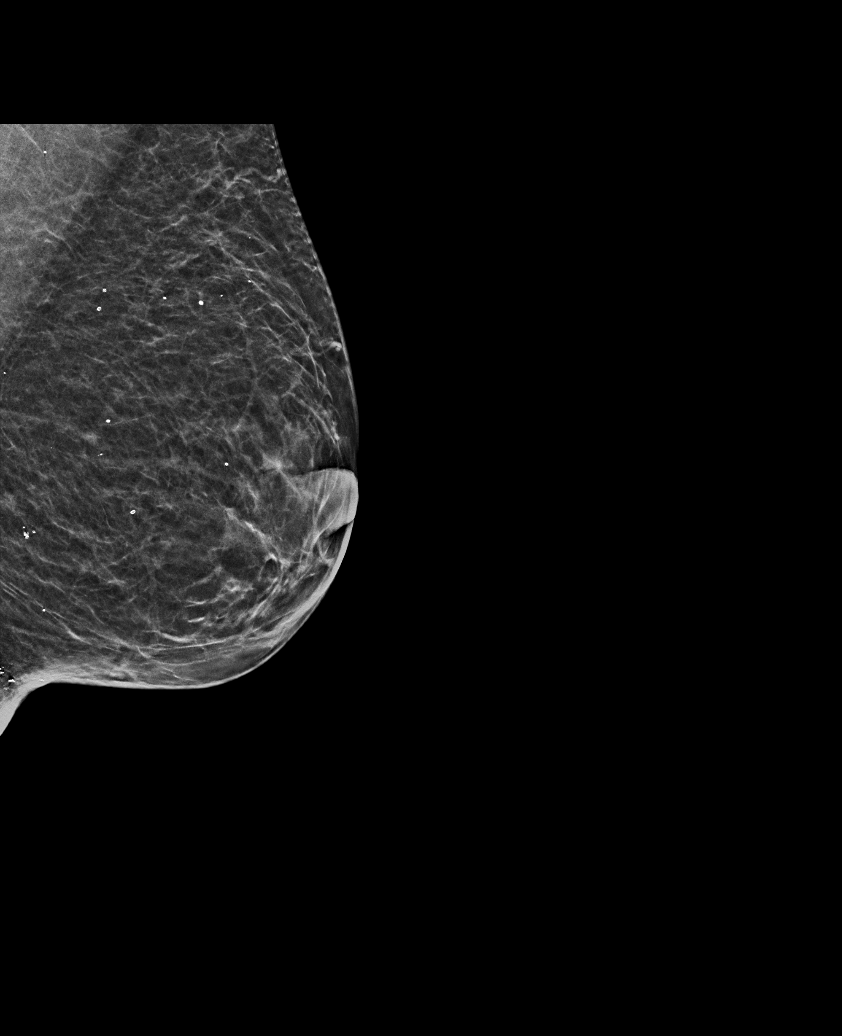

[R CC synth-2D]
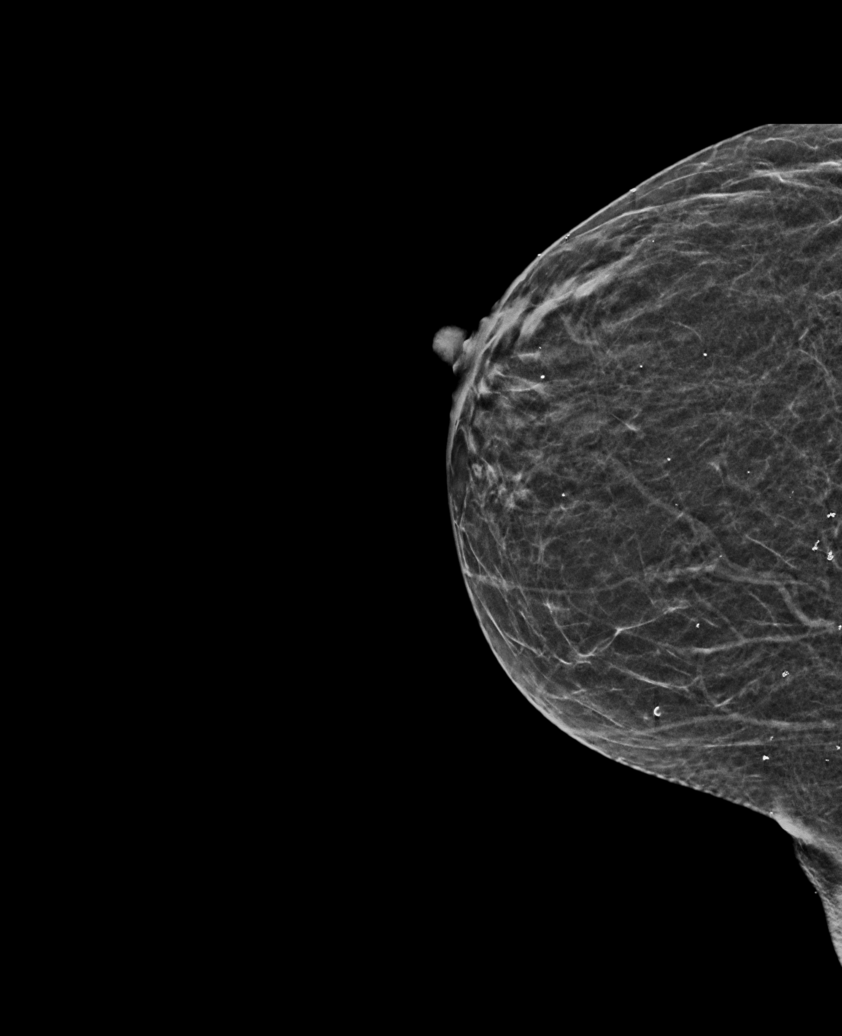

[R XCCM synth-2D]
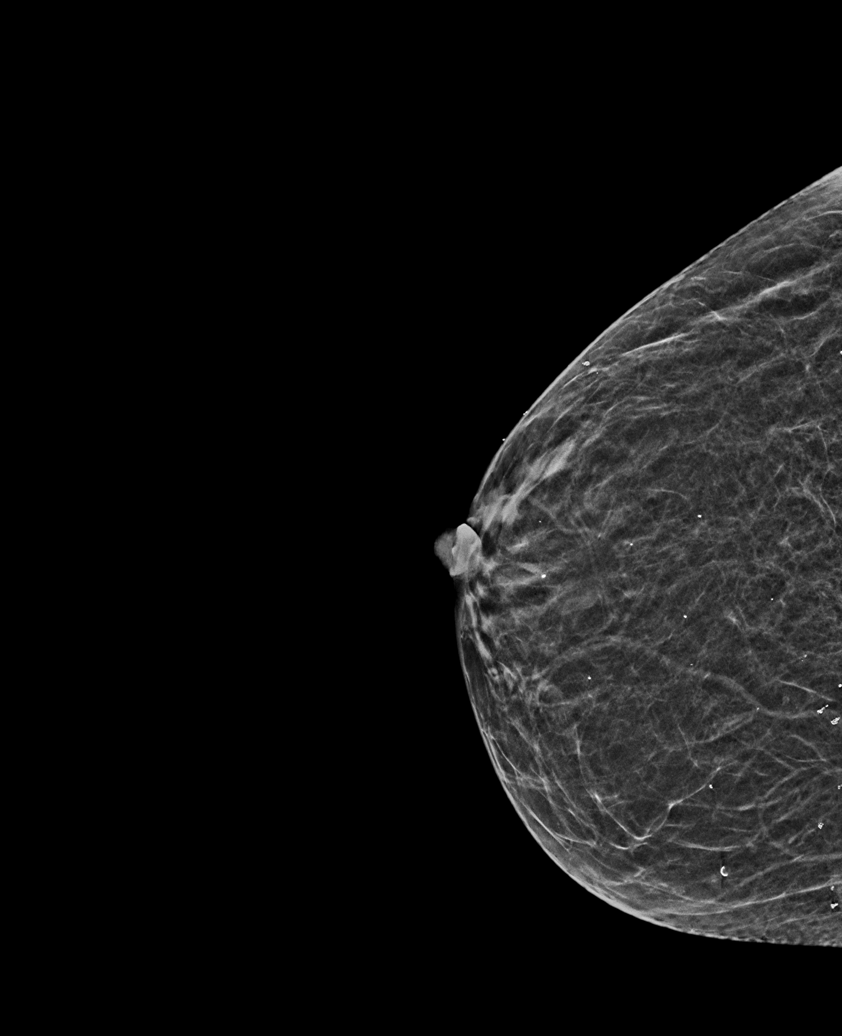

[L CC tomo · tomo slice 21/42.0]
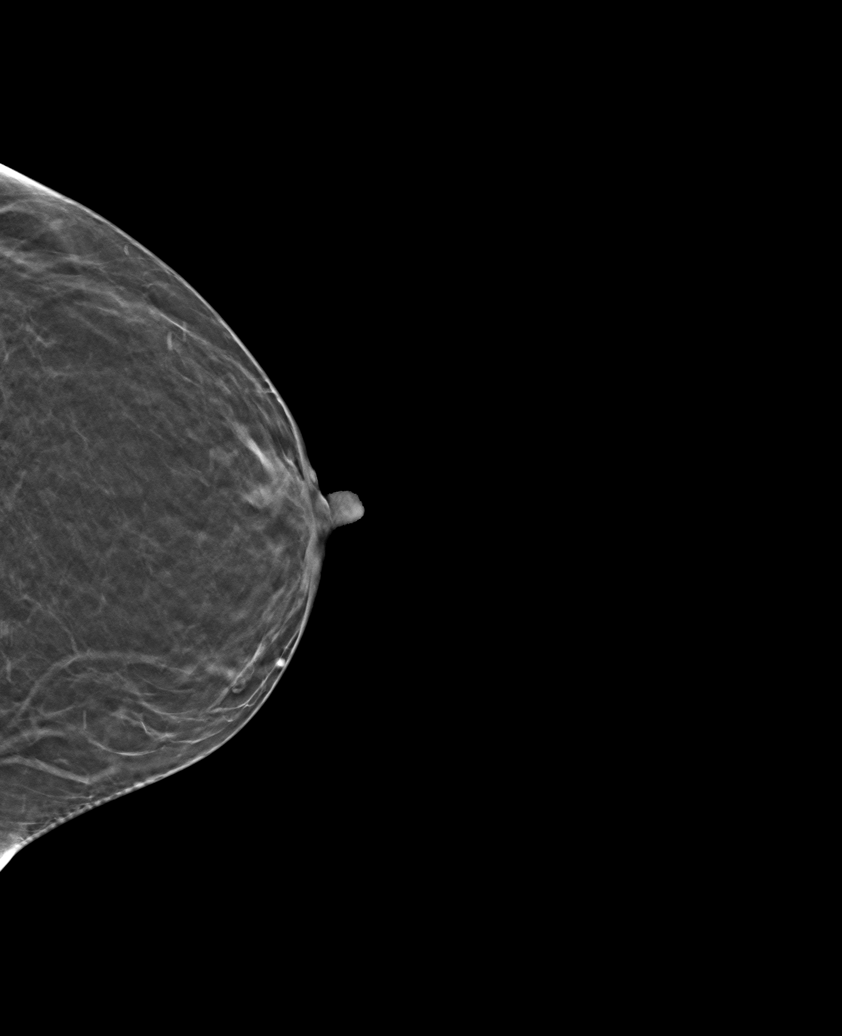

[6 of 30 positions shown; findings below may reference images not displayed]

ACR Breast Density Category b: There are scattered areas of
fibroglandular density.
FINDINGS: There are no findings suspicious for malignancy. Images were
processed with CAD.
IMPRESSION: No mammographic evidence of malignancy. A result letter of this
screening mammogram will be mailed directly to the patient.

RECOMMENDATION:
Screening mammogram in one year. (Code:[id number])

BI-RADS CATEGORY  1: Negative.

## 2021-01-05 DIAGNOSIS — Z23 Encounter for immunization: Secondary | ICD-10-CM | POA: Diagnosis not present

## 2021-01-05 DIAGNOSIS — D631 Anemia in chronic kidney disease: Secondary | ICD-10-CM | POA: Diagnosis not present

## 2021-01-05 DIAGNOSIS — N186 End stage renal disease: Secondary | ICD-10-CM | POA: Diagnosis not present

## 2021-01-05 DIAGNOSIS — Z992 Dependence on renal dialysis: Secondary | ICD-10-CM | POA: Diagnosis not present

## 2021-01-05 DIAGNOSIS — N2581 Secondary hyperparathyroidism of renal origin: Secondary | ICD-10-CM | POA: Diagnosis not present

## 2021-01-05 DIAGNOSIS — D509 Iron deficiency anemia, unspecified: Secondary | ICD-10-CM | POA: Diagnosis not present

## 2021-01-06 DIAGNOSIS — Z23 Encounter for immunization: Secondary | ICD-10-CM | POA: Diagnosis not present

## 2021-01-06 DIAGNOSIS — N186 End stage renal disease: Secondary | ICD-10-CM | POA: Diagnosis not present

## 2021-01-06 DIAGNOSIS — N2581 Secondary hyperparathyroidism of renal origin: Secondary | ICD-10-CM | POA: Diagnosis not present

## 2021-01-06 DIAGNOSIS — D631 Anemia in chronic kidney disease: Secondary | ICD-10-CM | POA: Diagnosis not present

## 2021-01-06 DIAGNOSIS — D509 Iron deficiency anemia, unspecified: Secondary | ICD-10-CM | POA: Diagnosis not present

## 2021-01-06 DIAGNOSIS — Z992 Dependence on renal dialysis: Secondary | ICD-10-CM | POA: Diagnosis not present

## 2021-01-07 DIAGNOSIS — N186 End stage renal disease: Secondary | ICD-10-CM | POA: Diagnosis not present

## 2021-01-07 DIAGNOSIS — Z992 Dependence on renal dialysis: Secondary | ICD-10-CM | POA: Diagnosis not present

## 2021-01-08 DIAGNOSIS — Z23 Encounter for immunization: Secondary | ICD-10-CM | POA: Diagnosis not present

## 2021-01-08 DIAGNOSIS — N2581 Secondary hyperparathyroidism of renal origin: Secondary | ICD-10-CM | POA: Diagnosis not present

## 2021-01-08 DIAGNOSIS — Z992 Dependence on renal dialysis: Secondary | ICD-10-CM | POA: Diagnosis not present

## 2021-01-08 DIAGNOSIS — N186 End stage renal disease: Secondary | ICD-10-CM | POA: Diagnosis not present

## 2021-01-08 DIAGNOSIS — D509 Iron deficiency anemia, unspecified: Secondary | ICD-10-CM | POA: Diagnosis not present

## 2021-01-08 DIAGNOSIS — D631 Anemia in chronic kidney disease: Secondary | ICD-10-CM | POA: Diagnosis not present

## 2021-01-09 DIAGNOSIS — D509 Iron deficiency anemia, unspecified: Secondary | ICD-10-CM | POA: Diagnosis not present

## 2021-01-09 DIAGNOSIS — N2581 Secondary hyperparathyroidism of renal origin: Secondary | ICD-10-CM | POA: Diagnosis not present

## 2021-01-09 DIAGNOSIS — N186 End stage renal disease: Secondary | ICD-10-CM | POA: Diagnosis not present

## 2021-01-09 DIAGNOSIS — D631 Anemia in chronic kidney disease: Secondary | ICD-10-CM | POA: Diagnosis not present

## 2021-01-09 DIAGNOSIS — Z992 Dependence on renal dialysis: Secondary | ICD-10-CM | POA: Diagnosis not present

## 2021-01-09 DIAGNOSIS — Z23 Encounter for immunization: Secondary | ICD-10-CM | POA: Diagnosis not present

## 2021-01-10 DIAGNOSIS — D631 Anemia in chronic kidney disease: Secondary | ICD-10-CM | POA: Diagnosis not present

## 2021-01-10 DIAGNOSIS — N186 End stage renal disease: Secondary | ICD-10-CM | POA: Diagnosis not present

## 2021-01-10 DIAGNOSIS — Z23 Encounter for immunization: Secondary | ICD-10-CM | POA: Diagnosis not present

## 2021-01-10 DIAGNOSIS — Z992 Dependence on renal dialysis: Secondary | ICD-10-CM | POA: Diagnosis not present

## 2021-01-10 DIAGNOSIS — D509 Iron deficiency anemia, unspecified: Secondary | ICD-10-CM | POA: Diagnosis not present

## 2021-01-10 DIAGNOSIS — N2581 Secondary hyperparathyroidism of renal origin: Secondary | ICD-10-CM | POA: Diagnosis not present

## 2021-01-11 DIAGNOSIS — Z23 Encounter for immunization: Secondary | ICD-10-CM | POA: Diagnosis not present

## 2021-01-11 DIAGNOSIS — Z992 Dependence on renal dialysis: Secondary | ICD-10-CM | POA: Diagnosis not present

## 2021-01-11 DIAGNOSIS — N186 End stage renal disease: Secondary | ICD-10-CM | POA: Diagnosis not present

## 2021-01-11 DIAGNOSIS — N2581 Secondary hyperparathyroidism of renal origin: Secondary | ICD-10-CM | POA: Diagnosis not present

## 2021-01-11 DIAGNOSIS — D509 Iron deficiency anemia, unspecified: Secondary | ICD-10-CM | POA: Diagnosis not present

## 2021-01-11 DIAGNOSIS — D631 Anemia in chronic kidney disease: Secondary | ICD-10-CM | POA: Diagnosis not present

## 2021-01-12 DIAGNOSIS — Z992 Dependence on renal dialysis: Secondary | ICD-10-CM | POA: Diagnosis not present

## 2021-01-12 DIAGNOSIS — D509 Iron deficiency anemia, unspecified: Secondary | ICD-10-CM | POA: Diagnosis not present

## 2021-01-12 DIAGNOSIS — N186 End stage renal disease: Secondary | ICD-10-CM | POA: Diagnosis not present

## 2021-01-12 DIAGNOSIS — Z23 Encounter for immunization: Secondary | ICD-10-CM | POA: Diagnosis not present

## 2021-01-12 DIAGNOSIS — N2581 Secondary hyperparathyroidism of renal origin: Secondary | ICD-10-CM | POA: Diagnosis not present

## 2021-01-12 DIAGNOSIS — D631 Anemia in chronic kidney disease: Secondary | ICD-10-CM | POA: Diagnosis not present

## 2021-01-13 DIAGNOSIS — N2581 Secondary hyperparathyroidism of renal origin: Secondary | ICD-10-CM | POA: Diagnosis not present

## 2021-01-13 DIAGNOSIS — Z23 Encounter for immunization: Secondary | ICD-10-CM | POA: Diagnosis not present

## 2021-01-13 DIAGNOSIS — N186 End stage renal disease: Secondary | ICD-10-CM | POA: Diagnosis not present

## 2021-01-13 DIAGNOSIS — D509 Iron deficiency anemia, unspecified: Secondary | ICD-10-CM | POA: Diagnosis not present

## 2021-01-13 DIAGNOSIS — Z992 Dependence on renal dialysis: Secondary | ICD-10-CM | POA: Diagnosis not present

## 2021-01-13 DIAGNOSIS — D631 Anemia in chronic kidney disease: Secondary | ICD-10-CM | POA: Diagnosis not present

## 2021-01-14 DIAGNOSIS — Z992 Dependence on renal dialysis: Secondary | ICD-10-CM | POA: Diagnosis not present

## 2021-01-14 DIAGNOSIS — N186 End stage renal disease: Secondary | ICD-10-CM | POA: Diagnosis not present

## 2021-01-15 DIAGNOSIS — D631 Anemia in chronic kidney disease: Secondary | ICD-10-CM | POA: Diagnosis not present

## 2021-01-15 DIAGNOSIS — Z992 Dependence on renal dialysis: Secondary | ICD-10-CM | POA: Diagnosis not present

## 2021-01-15 DIAGNOSIS — N2581 Secondary hyperparathyroidism of renal origin: Secondary | ICD-10-CM | POA: Diagnosis not present

## 2021-01-15 DIAGNOSIS — Z23 Encounter for immunization: Secondary | ICD-10-CM | POA: Diagnosis not present

## 2021-01-15 DIAGNOSIS — N186 End stage renal disease: Secondary | ICD-10-CM | POA: Diagnosis not present

## 2021-01-15 DIAGNOSIS — D509 Iron deficiency anemia, unspecified: Secondary | ICD-10-CM | POA: Diagnosis not present

## 2021-01-16 DIAGNOSIS — D631 Anemia in chronic kidney disease: Secondary | ICD-10-CM | POA: Diagnosis not present

## 2021-01-16 DIAGNOSIS — N186 End stage renal disease: Secondary | ICD-10-CM | POA: Diagnosis not present

## 2021-01-16 DIAGNOSIS — D509 Iron deficiency anemia, unspecified: Secondary | ICD-10-CM | POA: Diagnosis not present

## 2021-01-16 DIAGNOSIS — N2581 Secondary hyperparathyroidism of renal origin: Secondary | ICD-10-CM | POA: Diagnosis not present

## 2021-01-16 DIAGNOSIS — Z992 Dependence on renal dialysis: Secondary | ICD-10-CM | POA: Diagnosis not present

## 2021-01-16 DIAGNOSIS — Z23 Encounter for immunization: Secondary | ICD-10-CM | POA: Diagnosis not present

## 2021-01-17 DIAGNOSIS — N2581 Secondary hyperparathyroidism of renal origin: Secondary | ICD-10-CM | POA: Diagnosis not present

## 2021-01-17 DIAGNOSIS — D509 Iron deficiency anemia, unspecified: Secondary | ICD-10-CM | POA: Diagnosis not present

## 2021-01-17 DIAGNOSIS — Z23 Encounter for immunization: Secondary | ICD-10-CM | POA: Diagnosis not present

## 2021-01-17 DIAGNOSIS — D631 Anemia in chronic kidney disease: Secondary | ICD-10-CM | POA: Diagnosis not present

## 2021-01-17 DIAGNOSIS — N186 End stage renal disease: Secondary | ICD-10-CM | POA: Diagnosis not present

## 2021-01-17 DIAGNOSIS — Z992 Dependence on renal dialysis: Secondary | ICD-10-CM | POA: Diagnosis not present

## 2021-01-18 DIAGNOSIS — D509 Iron deficiency anemia, unspecified: Secondary | ICD-10-CM | POA: Diagnosis not present

## 2021-01-18 DIAGNOSIS — N2581 Secondary hyperparathyroidism of renal origin: Secondary | ICD-10-CM | POA: Diagnosis not present

## 2021-01-18 DIAGNOSIS — Z992 Dependence on renal dialysis: Secondary | ICD-10-CM | POA: Diagnosis not present

## 2021-01-18 DIAGNOSIS — Z23 Encounter for immunization: Secondary | ICD-10-CM | POA: Diagnosis not present

## 2021-01-18 DIAGNOSIS — N186 End stage renal disease: Secondary | ICD-10-CM | POA: Diagnosis not present

## 2021-01-18 DIAGNOSIS — D631 Anemia in chronic kidney disease: Secondary | ICD-10-CM | POA: Diagnosis not present

## 2021-01-19 DIAGNOSIS — N186 End stage renal disease: Secondary | ICD-10-CM | POA: Diagnosis not present

## 2021-01-19 DIAGNOSIS — D509 Iron deficiency anemia, unspecified: Secondary | ICD-10-CM | POA: Diagnosis not present

## 2021-01-19 DIAGNOSIS — D631 Anemia in chronic kidney disease: Secondary | ICD-10-CM | POA: Diagnosis not present

## 2021-01-19 DIAGNOSIS — Z992 Dependence on renal dialysis: Secondary | ICD-10-CM | POA: Diagnosis not present

## 2021-01-19 DIAGNOSIS — Z23 Encounter for immunization: Secondary | ICD-10-CM | POA: Diagnosis not present

## 2021-01-19 DIAGNOSIS — N2581 Secondary hyperparathyroidism of renal origin: Secondary | ICD-10-CM | POA: Diagnosis not present

## 2021-01-20 DIAGNOSIS — Z992 Dependence on renal dialysis: Secondary | ICD-10-CM | POA: Diagnosis not present

## 2021-01-20 DIAGNOSIS — N186 End stage renal disease: Secondary | ICD-10-CM | POA: Diagnosis not present

## 2021-01-21 DIAGNOSIS — D631 Anemia in chronic kidney disease: Secondary | ICD-10-CM | POA: Diagnosis not present

## 2021-01-21 DIAGNOSIS — Z23 Encounter for immunization: Secondary | ICD-10-CM | POA: Diagnosis not present

## 2021-01-21 DIAGNOSIS — N2581 Secondary hyperparathyroidism of renal origin: Secondary | ICD-10-CM | POA: Diagnosis not present

## 2021-01-21 DIAGNOSIS — D509 Iron deficiency anemia, unspecified: Secondary | ICD-10-CM | POA: Diagnosis not present

## 2021-01-21 DIAGNOSIS — Z992 Dependence on renal dialysis: Secondary | ICD-10-CM | POA: Diagnosis not present

## 2021-01-21 DIAGNOSIS — N186 End stage renal disease: Secondary | ICD-10-CM | POA: Diagnosis not present

## 2021-01-22 DIAGNOSIS — D631 Anemia in chronic kidney disease: Secondary | ICD-10-CM | POA: Diagnosis not present

## 2021-01-22 DIAGNOSIS — Z23 Encounter for immunization: Secondary | ICD-10-CM | POA: Diagnosis not present

## 2021-01-22 DIAGNOSIS — N186 End stage renal disease: Secondary | ICD-10-CM | POA: Diagnosis not present

## 2021-01-22 DIAGNOSIS — N2581 Secondary hyperparathyroidism of renal origin: Secondary | ICD-10-CM | POA: Diagnosis not present

## 2021-01-22 DIAGNOSIS — D509 Iron deficiency anemia, unspecified: Secondary | ICD-10-CM | POA: Diagnosis not present

## 2021-01-22 DIAGNOSIS — Z992 Dependence on renal dialysis: Secondary | ICD-10-CM | POA: Diagnosis not present

## 2021-01-22 HISTORY — PX: KIDNEY TRANSPLANT: SHX239

## 2021-01-23 DIAGNOSIS — Z23 Encounter for immunization: Secondary | ICD-10-CM | POA: Diagnosis not present

## 2021-01-23 DIAGNOSIS — N186 End stage renal disease: Secondary | ICD-10-CM | POA: Diagnosis not present

## 2021-01-23 DIAGNOSIS — D509 Iron deficiency anemia, unspecified: Secondary | ICD-10-CM | POA: Diagnosis not present

## 2021-01-23 DIAGNOSIS — Z992 Dependence on renal dialysis: Secondary | ICD-10-CM | POA: Diagnosis not present

## 2021-01-23 DIAGNOSIS — D631 Anemia in chronic kidney disease: Secondary | ICD-10-CM | POA: Diagnosis not present

## 2021-01-23 DIAGNOSIS — N2581 Secondary hyperparathyroidism of renal origin: Secondary | ICD-10-CM | POA: Diagnosis not present

## 2021-01-24 DIAGNOSIS — Z992 Dependence on renal dialysis: Secondary | ICD-10-CM | POA: Diagnosis not present

## 2021-01-24 DIAGNOSIS — D509 Iron deficiency anemia, unspecified: Secondary | ICD-10-CM | POA: Diagnosis not present

## 2021-01-24 DIAGNOSIS — D631 Anemia in chronic kidney disease: Secondary | ICD-10-CM | POA: Diagnosis not present

## 2021-01-24 DIAGNOSIS — Z23 Encounter for immunization: Secondary | ICD-10-CM | POA: Diagnosis not present

## 2021-01-24 DIAGNOSIS — N2581 Secondary hyperparathyroidism of renal origin: Secondary | ICD-10-CM | POA: Diagnosis not present

## 2021-01-24 DIAGNOSIS — N186 End stage renal disease: Secondary | ICD-10-CM | POA: Diagnosis not present

## 2021-01-25 DIAGNOSIS — N186 End stage renal disease: Secondary | ICD-10-CM | POA: Diagnosis not present

## 2021-01-25 DIAGNOSIS — D631 Anemia in chronic kidney disease: Secondary | ICD-10-CM | POA: Diagnosis not present

## 2021-01-25 DIAGNOSIS — N2581 Secondary hyperparathyroidism of renal origin: Secondary | ICD-10-CM | POA: Diagnosis not present

## 2021-01-25 DIAGNOSIS — Z23 Encounter for immunization: Secondary | ICD-10-CM | POA: Diagnosis not present

## 2021-01-25 DIAGNOSIS — D509 Iron deficiency anemia, unspecified: Secondary | ICD-10-CM | POA: Diagnosis not present

## 2021-01-25 DIAGNOSIS — Z992 Dependence on renal dialysis: Secondary | ICD-10-CM | POA: Diagnosis not present

## 2021-01-26 DIAGNOSIS — I440 Atrioventricular block, first degree: Secondary | ICD-10-CM | POA: Diagnosis not present

## 2021-01-26 DIAGNOSIS — I120 Hypertensive chronic kidney disease with stage 5 chronic kidney disease or end stage renal disease: Secondary | ICD-10-CM | POA: Diagnosis not present

## 2021-01-26 DIAGNOSIS — Z20822 Contact with and (suspected) exposure to covid-19: Secondary | ICD-10-CM | POA: Diagnosis not present

## 2021-01-26 DIAGNOSIS — F419 Anxiety disorder, unspecified: Secondary | ICD-10-CM | POA: Diagnosis not present

## 2021-01-26 DIAGNOSIS — Z5181 Encounter for therapeutic drug level monitoring: Secondary | ICD-10-CM | POA: Diagnosis not present

## 2021-01-26 DIAGNOSIS — D631 Anemia in chronic kidney disease: Secondary | ICD-10-CM | POA: Diagnosis not present

## 2021-01-26 DIAGNOSIS — Z4822 Encounter for aftercare following kidney transplant: Secondary | ICD-10-CM | POA: Diagnosis not present

## 2021-01-26 DIAGNOSIS — F32A Depression, unspecified: Secondary | ICD-10-CM | POA: Diagnosis not present

## 2021-01-26 DIAGNOSIS — E042 Nontoxic multinodular goiter: Secondary | ICD-10-CM | POA: Diagnosis not present

## 2021-01-26 DIAGNOSIS — Z4902 Encounter for fitting and adjustment of peritoneal dialysis catheter: Secondary | ICD-10-CM | POA: Diagnosis not present

## 2021-01-26 DIAGNOSIS — I340 Nonrheumatic mitral (valve) insufficiency: Secondary | ICD-10-CM | POA: Diagnosis not present

## 2021-01-26 DIAGNOSIS — N269 Renal sclerosis, unspecified: Secondary | ICD-10-CM | POA: Diagnosis not present

## 2021-01-26 DIAGNOSIS — E119 Type 2 diabetes mellitus without complications: Secondary | ICD-10-CM | POA: Diagnosis not present

## 2021-01-26 DIAGNOSIS — Z01818 Encounter for other preprocedural examination: Secondary | ICD-10-CM | POA: Diagnosis not present

## 2021-01-26 DIAGNOSIS — N041 Nephrotic syndrome with focal and segmental glomerular lesions: Secondary | ICD-10-CM | POA: Diagnosis not present

## 2021-01-26 DIAGNOSIS — G4700 Insomnia, unspecified: Secondary | ICD-10-CM | POA: Diagnosis not present

## 2021-01-26 DIAGNOSIS — I10 Essential (primary) hypertension: Secondary | ICD-10-CM | POA: Diagnosis not present

## 2021-01-26 DIAGNOSIS — E890 Postprocedural hypothyroidism: Secondary | ICD-10-CM | POA: Diagnosis not present

## 2021-01-26 DIAGNOSIS — E1122 Type 2 diabetes mellitus with diabetic chronic kidney disease: Secondary | ICD-10-CM | POA: Diagnosis not present

## 2021-01-26 DIAGNOSIS — E871 Hypo-osmolality and hyponatremia: Secondary | ICD-10-CM | POA: Diagnosis not present

## 2021-01-26 DIAGNOSIS — E039 Hypothyroidism, unspecified: Secondary | ICD-10-CM | POA: Diagnosis not present

## 2021-01-26 DIAGNOSIS — N186 End stage renal disease: Secondary | ICD-10-CM | POA: Diagnosis not present

## 2021-01-26 DIAGNOSIS — I459 Conduction disorder, unspecified: Secondary | ICD-10-CM | POA: Diagnosis not present

## 2021-01-26 DIAGNOSIS — N2581 Secondary hyperparathyroidism of renal origin: Secondary | ICD-10-CM | POA: Diagnosis not present

## 2021-01-26 DIAGNOSIS — I350 Nonrheumatic aortic (valve) stenosis: Secondary | ICD-10-CM | POA: Diagnosis not present

## 2021-01-26 DIAGNOSIS — D62 Acute posthemorrhagic anemia: Secondary | ICD-10-CM | POA: Diagnosis not present

## 2021-01-26 DIAGNOSIS — Z940 Kidney transplant status: Secondary | ICD-10-CM | POA: Diagnosis not present

## 2021-01-26 DIAGNOSIS — Z992 Dependence on renal dialysis: Secondary | ICD-10-CM | POA: Diagnosis not present

## 2021-01-26 DIAGNOSIS — I371 Nonrheumatic pulmonary valve insufficiency: Secondary | ICD-10-CM | POA: Diagnosis not present

## 2021-01-26 DIAGNOSIS — I1311 Hypertensive heart and chronic kidney disease without heart failure, with stage 5 chronic kidney disease, or end stage renal disease: Secondary | ICD-10-CM | POA: Diagnosis not present

## 2021-01-26 DIAGNOSIS — Z949 Transplanted organ and tissue status, unspecified: Secondary | ICD-10-CM | POA: Diagnosis not present

## 2021-01-31 DIAGNOSIS — Z949 Transplanted organ and tissue status, unspecified: Secondary | ICD-10-CM | POA: Diagnosis not present

## 2021-02-01 DIAGNOSIS — N186 End stage renal disease: Secondary | ICD-10-CM | POA: Diagnosis not present

## 2021-02-01 DIAGNOSIS — E1122 Type 2 diabetes mellitus with diabetic chronic kidney disease: Secondary | ICD-10-CM | POA: Diagnosis not present

## 2021-02-01 DIAGNOSIS — K219 Gastro-esophageal reflux disease without esophagitis: Secondary | ICD-10-CM | POA: Diagnosis not present

## 2021-02-01 DIAGNOSIS — D649 Anemia, unspecified: Secondary | ICD-10-CM | POA: Diagnosis not present

## 2021-02-01 DIAGNOSIS — I120 Hypertensive chronic kidney disease with stage 5 chronic kidney disease or end stage renal disease: Secondary | ICD-10-CM | POA: Diagnosis not present

## 2021-02-01 DIAGNOSIS — Z792 Long term (current) use of antibiotics: Secondary | ICD-10-CM | POA: Diagnosis not present

## 2021-02-01 DIAGNOSIS — Z4822 Encounter for aftercare following kidney transplant: Secondary | ICD-10-CM | POA: Diagnosis not present

## 2021-02-01 DIAGNOSIS — Z992 Dependence on renal dialysis: Secondary | ICD-10-CM | POA: Diagnosis not present

## 2021-02-01 DIAGNOSIS — I10 Essential (primary) hypertension: Secondary | ICD-10-CM | POA: Diagnosis not present

## 2021-02-01 DIAGNOSIS — T8619 Other complication of kidney transplant: Secondary | ICD-10-CM | POA: Diagnosis not present

## 2021-02-01 DIAGNOSIS — E890 Postprocedural hypothyroidism: Secondary | ICD-10-CM | POA: Diagnosis not present

## 2021-02-01 DIAGNOSIS — E569 Vitamin deficiency, unspecified: Secondary | ICD-10-CM | POA: Diagnosis not present

## 2021-02-01 DIAGNOSIS — E785 Hyperlipidemia, unspecified: Secondary | ICD-10-CM | POA: Diagnosis not present

## 2021-02-01 DIAGNOSIS — Z949 Transplanted organ and tissue status, unspecified: Secondary | ICD-10-CM | POA: Diagnosis not present

## 2021-02-01 DIAGNOSIS — Z7952 Long term (current) use of systemic steroids: Secondary | ICD-10-CM | POA: Diagnosis not present

## 2021-02-01 DIAGNOSIS — C73 Malignant neoplasm of thyroid gland: Secondary | ICD-10-CM | POA: Diagnosis not present

## 2021-02-01 DIAGNOSIS — E871 Hypo-osmolality and hyponatremia: Secondary | ICD-10-CM | POA: Diagnosis not present

## 2021-02-01 DIAGNOSIS — Z79621 Long term (current) use of calcineurin inhibitor: Secondary | ICD-10-CM | POA: Diagnosis not present

## 2021-02-03 DIAGNOSIS — L2089 Other atopic dermatitis: Secondary | ICD-10-CM | POA: Diagnosis not present

## 2021-02-03 DIAGNOSIS — D631 Anemia in chronic kidney disease: Secondary | ICD-10-CM | POA: Diagnosis not present

## 2021-02-03 DIAGNOSIS — E870 Hyperosmolality and hypernatremia: Secondary | ICD-10-CM | POA: Diagnosis not present

## 2021-02-03 DIAGNOSIS — E785 Hyperlipidemia, unspecified: Secondary | ICD-10-CM | POA: Diagnosis not present

## 2021-02-03 DIAGNOSIS — Z79621 Long term (current) use of calcineurin inhibitor: Secondary | ICD-10-CM | POA: Diagnosis not present

## 2021-02-03 DIAGNOSIS — Z7952 Long term (current) use of systemic steroids: Secondary | ICD-10-CM | POA: Diagnosis not present

## 2021-02-03 DIAGNOSIS — E1122 Type 2 diabetes mellitus with diabetic chronic kidney disease: Secondary | ICD-10-CM | POA: Diagnosis not present

## 2021-02-03 DIAGNOSIS — I10 Essential (primary) hypertension: Secondary | ICD-10-CM | POA: Diagnosis not present

## 2021-02-03 DIAGNOSIS — Z940 Kidney transplant status: Secondary | ICD-10-CM | POA: Diagnosis not present

## 2021-02-03 DIAGNOSIS — N186 End stage renal disease: Secondary | ICD-10-CM | POA: Diagnosis not present

## 2021-02-03 DIAGNOSIS — Z79899 Other long term (current) drug therapy: Secondary | ICD-10-CM | POA: Diagnosis not present

## 2021-02-03 DIAGNOSIS — Z949 Transplanted organ and tissue status, unspecified: Secondary | ICD-10-CM | POA: Diagnosis not present

## 2021-02-03 DIAGNOSIS — D84821 Immunodeficiency due to drugs: Secondary | ICD-10-CM | POA: Diagnosis not present

## 2021-02-03 DIAGNOSIS — Z4822 Encounter for aftercare following kidney transplant: Secondary | ICD-10-CM | POA: Diagnosis not present

## 2021-02-03 DIAGNOSIS — I951 Orthostatic hypotension: Secondary | ICD-10-CM | POA: Diagnosis not present

## 2021-02-03 DIAGNOSIS — I120 Hypertensive chronic kidney disease with stage 5 chronic kidney disease or end stage renal disease: Secondary | ICD-10-CM | POA: Diagnosis not present

## 2021-02-03 DIAGNOSIS — T8619 Other complication of kidney transplant: Secondary | ICD-10-CM | POA: Diagnosis not present

## 2021-02-06 DIAGNOSIS — D849 Immunodeficiency, unspecified: Secondary | ICD-10-CM | POA: Diagnosis not present

## 2021-02-06 DIAGNOSIS — Z940 Kidney transplant status: Secondary | ICD-10-CM | POA: Diagnosis not present

## 2021-02-06 DIAGNOSIS — Z79899 Other long term (current) drug therapy: Secondary | ICD-10-CM | POA: Diagnosis not present

## 2021-02-06 DIAGNOSIS — N051 Unspecified nephritic syndrome with focal and segmental glomerular lesions: Secondary | ICD-10-CM | POA: Diagnosis not present

## 2021-02-06 DIAGNOSIS — Z7952 Long term (current) use of systemic steroids: Secondary | ICD-10-CM | POA: Diagnosis not present

## 2021-02-06 DIAGNOSIS — Z85850 Personal history of malignant neoplasm of thyroid: Secondary | ICD-10-CM | POA: Diagnosis not present

## 2021-02-06 DIAGNOSIS — Z4822 Encounter for aftercare following kidney transplant: Secondary | ICD-10-CM | POA: Diagnosis not present

## 2021-02-06 DIAGNOSIS — I951 Orthostatic hypotension: Secondary | ICD-10-CM | POA: Diagnosis not present

## 2021-02-06 DIAGNOSIS — E785 Hyperlipidemia, unspecified: Secondary | ICD-10-CM | POA: Diagnosis not present

## 2021-02-06 DIAGNOSIS — Z9884 Bariatric surgery status: Secondary | ICD-10-CM | POA: Diagnosis not present

## 2021-02-06 DIAGNOSIS — L209 Atopic dermatitis, unspecified: Secondary | ICD-10-CM | POA: Diagnosis not present

## 2021-02-06 DIAGNOSIS — I10 Essential (primary) hypertension: Secondary | ICD-10-CM | POA: Diagnosis not present

## 2021-02-06 DIAGNOSIS — N186 End stage renal disease: Secondary | ICD-10-CM | POA: Diagnosis not present

## 2021-02-06 DIAGNOSIS — Z949 Transplanted organ and tissue status, unspecified: Secondary | ICD-10-CM | POA: Diagnosis not present

## 2021-02-06 DIAGNOSIS — E871 Hypo-osmolality and hyponatremia: Secondary | ICD-10-CM | POA: Diagnosis not present

## 2021-02-08 DIAGNOSIS — Z9884 Bariatric surgery status: Secondary | ICD-10-CM | POA: Diagnosis not present

## 2021-02-08 DIAGNOSIS — D649 Anemia, unspecified: Secondary | ICD-10-CM | POA: Diagnosis not present

## 2021-02-08 DIAGNOSIS — E860 Dehydration: Secondary | ICD-10-CM | POA: Diagnosis not present

## 2021-02-08 DIAGNOSIS — Z4822 Encounter for aftercare following kidney transplant: Secondary | ICD-10-CM | POA: Diagnosis not present

## 2021-02-08 DIAGNOSIS — I10 Essential (primary) hypertension: Secondary | ICD-10-CM | POA: Diagnosis not present

## 2021-02-08 DIAGNOSIS — Z949 Transplanted organ and tissue status, unspecified: Secondary | ICD-10-CM | POA: Diagnosis not present

## 2021-02-08 DIAGNOSIS — E785 Hyperlipidemia, unspecified: Secondary | ICD-10-CM | POA: Diagnosis not present

## 2021-02-08 DIAGNOSIS — Z940 Kidney transplant status: Secondary | ICD-10-CM | POA: Diagnosis not present

## 2021-02-08 DIAGNOSIS — L209 Atopic dermatitis, unspecified: Secondary | ICD-10-CM | POA: Diagnosis not present

## 2021-02-08 DIAGNOSIS — D849 Immunodeficiency, unspecified: Secondary | ICD-10-CM | POA: Diagnosis not present

## 2021-02-10 DIAGNOSIS — E785 Hyperlipidemia, unspecified: Secondary | ICD-10-CM | POA: Diagnosis not present

## 2021-02-10 DIAGNOSIS — Z949 Transplanted organ and tissue status, unspecified: Secondary | ICD-10-CM | POA: Diagnosis not present

## 2021-02-10 DIAGNOSIS — N189 Chronic kidney disease, unspecified: Secondary | ICD-10-CM | POA: Diagnosis not present

## 2021-02-10 DIAGNOSIS — D849 Immunodeficiency, unspecified: Secondary | ICD-10-CM | POA: Diagnosis not present

## 2021-02-10 DIAGNOSIS — Z5181 Encounter for therapeutic drug level monitoring: Secondary | ICD-10-CM | POA: Diagnosis not present

## 2021-02-10 DIAGNOSIS — L209 Atopic dermatitis, unspecified: Secondary | ICD-10-CM | POA: Diagnosis not present

## 2021-02-10 DIAGNOSIS — Z79899 Other long term (current) drug therapy: Secondary | ICD-10-CM | POA: Diagnosis not present

## 2021-02-10 DIAGNOSIS — Z940 Kidney transplant status: Secondary | ICD-10-CM | POA: Diagnosis not present

## 2021-02-10 DIAGNOSIS — Z9884 Bariatric surgery status: Secondary | ICD-10-CM | POA: Diagnosis not present

## 2021-02-10 DIAGNOSIS — Z4822 Encounter for aftercare following kidney transplant: Secondary | ICD-10-CM | POA: Diagnosis not present

## 2021-02-10 DIAGNOSIS — E871 Hypo-osmolality and hyponatremia: Secondary | ICD-10-CM | POA: Diagnosis not present

## 2021-02-10 DIAGNOSIS — N051 Unspecified nephritic syndrome with focal and segmental glomerular lesions: Secondary | ICD-10-CM | POA: Diagnosis not present

## 2021-02-10 DIAGNOSIS — I951 Orthostatic hypotension: Secondary | ICD-10-CM | POA: Diagnosis not present

## 2021-02-10 DIAGNOSIS — D649 Anemia, unspecified: Secondary | ICD-10-CM | POA: Diagnosis not present

## 2021-02-10 DIAGNOSIS — Z79621 Long term (current) use of calcineurin inhibitor: Secondary | ICD-10-CM | POA: Diagnosis not present

## 2021-02-13 DIAGNOSIS — N186 End stage renal disease: Secondary | ICD-10-CM | POA: Diagnosis not present

## 2021-02-13 DIAGNOSIS — I951 Orthostatic hypotension: Secondary | ICD-10-CM | POA: Diagnosis not present

## 2021-02-13 DIAGNOSIS — R591 Generalized enlarged lymph nodes: Secondary | ICD-10-CM | POA: Diagnosis not present

## 2021-02-13 DIAGNOSIS — Z940 Kidney transplant status: Secondary | ICD-10-CM | POA: Diagnosis not present

## 2021-02-13 DIAGNOSIS — Z4822 Encounter for aftercare following kidney transplant: Secondary | ICD-10-CM | POA: Diagnosis not present

## 2021-02-13 DIAGNOSIS — D631 Anemia in chronic kidney disease: Secondary | ICD-10-CM | POA: Diagnosis not present

## 2021-02-13 DIAGNOSIS — E860 Dehydration: Secondary | ICD-10-CM | POA: Diagnosis not present

## 2021-02-13 DIAGNOSIS — E890 Postprocedural hypothyroidism: Secondary | ICD-10-CM | POA: Diagnosis not present

## 2021-02-13 DIAGNOSIS — I120 Hypertensive chronic kidney disease with stage 5 chronic kidney disease or end stage renal disease: Secondary | ICD-10-CM | POA: Diagnosis not present

## 2021-02-13 DIAGNOSIS — D849 Immunodeficiency, unspecified: Secondary | ICD-10-CM | POA: Diagnosis not present

## 2021-02-13 DIAGNOSIS — Z949 Transplanted organ and tissue status, unspecified: Secondary | ICD-10-CM | POA: Diagnosis not present

## 2021-02-15 DIAGNOSIS — Z4822 Encounter for aftercare following kidney transplant: Secondary | ICD-10-CM | POA: Diagnosis not present

## 2021-02-15 DIAGNOSIS — Z79621 Long term (current) use of calcineurin inhibitor: Secondary | ICD-10-CM | POA: Diagnosis not present

## 2021-02-15 DIAGNOSIS — Z940 Kidney transplant status: Secondary | ICD-10-CM | POA: Diagnosis not present

## 2021-02-15 DIAGNOSIS — L209 Atopic dermatitis, unspecified: Secondary | ICD-10-CM | POA: Diagnosis not present

## 2021-02-15 DIAGNOSIS — Z85850 Personal history of malignant neoplasm of thyroid: Secondary | ICD-10-CM | POA: Diagnosis not present

## 2021-02-15 DIAGNOSIS — E785 Hyperlipidemia, unspecified: Secondary | ICD-10-CM | POA: Diagnosis not present

## 2021-02-15 DIAGNOSIS — I10 Essential (primary) hypertension: Secondary | ICD-10-CM | POA: Diagnosis not present

## 2021-02-15 DIAGNOSIS — D849 Immunodeficiency, unspecified: Secondary | ICD-10-CM | POA: Diagnosis not present

## 2021-02-15 DIAGNOSIS — E441 Mild protein-calorie malnutrition: Secondary | ICD-10-CM | POA: Diagnosis not present

## 2021-02-15 DIAGNOSIS — Z949 Transplanted organ and tissue status, unspecified: Secondary | ICD-10-CM | POA: Diagnosis not present

## 2021-02-15 DIAGNOSIS — I951 Orthostatic hypotension: Secondary | ICD-10-CM | POA: Diagnosis not present

## 2021-02-15 DIAGNOSIS — E860 Dehydration: Secondary | ICD-10-CM | POA: Diagnosis not present

## 2021-02-15 DIAGNOSIS — D649 Anemia, unspecified: Secondary | ICD-10-CM | POA: Diagnosis not present

## 2021-02-17 DIAGNOSIS — I120 Hypertensive chronic kidney disease with stage 5 chronic kidney disease or end stage renal disease: Secondary | ICD-10-CM | POA: Diagnosis not present

## 2021-02-17 DIAGNOSIS — E892 Postprocedural hypoparathyroidism: Secondary | ICD-10-CM | POA: Diagnosis not present

## 2021-02-17 DIAGNOSIS — Z4822 Encounter for aftercare following kidney transplant: Secondary | ICD-10-CM | POA: Diagnosis not present

## 2021-02-17 DIAGNOSIS — N186 End stage renal disease: Secondary | ICD-10-CM | POA: Diagnosis not present

## 2021-02-17 DIAGNOSIS — E890 Postprocedural hypothyroidism: Secondary | ICD-10-CM | POA: Diagnosis not present

## 2021-02-17 DIAGNOSIS — I951 Orthostatic hypotension: Secondary | ICD-10-CM | POA: Diagnosis not present

## 2021-02-17 DIAGNOSIS — D631 Anemia in chronic kidney disease: Secondary | ICD-10-CM | POA: Diagnosis not present

## 2021-02-17 DIAGNOSIS — D849 Immunodeficiency, unspecified: Secondary | ICD-10-CM | POA: Diagnosis not present

## 2021-02-17 DIAGNOSIS — Z940 Kidney transplant status: Secondary | ICD-10-CM | POA: Diagnosis not present

## 2021-02-17 DIAGNOSIS — E860 Dehydration: Secondary | ICD-10-CM | POA: Diagnosis not present

## 2021-02-17 DIAGNOSIS — Z949 Transplanted organ and tissue status, unspecified: Secondary | ICD-10-CM | POA: Diagnosis not present

## 2021-02-20 DIAGNOSIS — D631 Anemia in chronic kidney disease: Secondary | ICD-10-CM | POA: Diagnosis not present

## 2021-02-20 DIAGNOSIS — I120 Hypertensive chronic kidney disease with stage 5 chronic kidney disease or end stage renal disease: Secondary | ICD-10-CM | POA: Diagnosis not present

## 2021-02-20 DIAGNOSIS — I951 Orthostatic hypotension: Secondary | ICD-10-CM | POA: Diagnosis not present

## 2021-02-20 DIAGNOSIS — E890 Postprocedural hypothyroidism: Secondary | ICD-10-CM | POA: Diagnosis not present

## 2021-02-20 DIAGNOSIS — D849 Immunodeficiency, unspecified: Secondary | ICD-10-CM | POA: Diagnosis not present

## 2021-02-20 DIAGNOSIS — Z4822 Encounter for aftercare following kidney transplant: Secondary | ICD-10-CM | POA: Diagnosis not present

## 2021-02-20 DIAGNOSIS — Z940 Kidney transplant status: Secondary | ICD-10-CM | POA: Diagnosis not present

## 2021-02-20 DIAGNOSIS — E876 Hypokalemia: Secondary | ICD-10-CM | POA: Diagnosis not present

## 2021-02-20 DIAGNOSIS — E860 Dehydration: Secondary | ICD-10-CM | POA: Diagnosis not present

## 2021-02-20 DIAGNOSIS — R630 Anorexia: Secondary | ICD-10-CM | POA: Diagnosis not present

## 2021-02-20 DIAGNOSIS — Z949 Transplanted organ and tissue status, unspecified: Secondary | ICD-10-CM | POA: Diagnosis not present

## 2021-02-20 DIAGNOSIS — N186 End stage renal disease: Secondary | ICD-10-CM | POA: Diagnosis not present

## 2021-02-22 DIAGNOSIS — Z949 Transplanted organ and tissue status, unspecified: Secondary | ICD-10-CM | POA: Diagnosis not present

## 2021-02-22 DIAGNOSIS — I120 Hypertensive chronic kidney disease with stage 5 chronic kidney disease or end stage renal disease: Secondary | ICD-10-CM | POA: Diagnosis not present

## 2021-02-22 DIAGNOSIS — Z940 Kidney transplant status: Secondary | ICD-10-CM | POA: Diagnosis not present

## 2021-02-22 DIAGNOSIS — D649 Anemia, unspecified: Secondary | ICD-10-CM | POA: Diagnosis not present

## 2021-02-22 DIAGNOSIS — D849 Immunodeficiency, unspecified: Secondary | ICD-10-CM | POA: Diagnosis not present

## 2021-02-22 DIAGNOSIS — Z5181 Encounter for therapeutic drug level monitoring: Secondary | ICD-10-CM | POA: Diagnosis not present

## 2021-02-22 DIAGNOSIS — E860 Dehydration: Secondary | ICD-10-CM | POA: Diagnosis not present

## 2021-02-22 DIAGNOSIS — Z4822 Encounter for aftercare following kidney transplant: Secondary | ICD-10-CM | POA: Diagnosis not present

## 2021-02-22 DIAGNOSIS — N186 End stage renal disease: Secondary | ICD-10-CM | POA: Diagnosis not present

## 2021-02-22 DIAGNOSIS — E890 Postprocedural hypothyroidism: Secondary | ICD-10-CM | POA: Diagnosis not present

## 2021-02-22 DIAGNOSIS — D631 Anemia in chronic kidney disease: Secondary | ICD-10-CM | POA: Diagnosis not present

## 2021-02-22 DIAGNOSIS — E892 Postprocedural hypoparathyroidism: Secondary | ICD-10-CM | POA: Diagnosis not present

## 2021-02-22 DIAGNOSIS — I951 Orthostatic hypotension: Secondary | ICD-10-CM | POA: Diagnosis not present

## 2021-02-22 DIAGNOSIS — Z79621 Long term (current) use of calcineurin inhibitor: Secondary | ICD-10-CM | POA: Diagnosis not present

## 2021-02-24 ENCOUNTER — Other Ambulatory Visit: Payer: Self-pay | Admitting: Cardiology

## 2021-03-02 DIAGNOSIS — D649 Anemia, unspecified: Secondary | ICD-10-CM | POA: Diagnosis not present

## 2021-03-02 DIAGNOSIS — Z4822 Encounter for aftercare following kidney transplant: Secondary | ICD-10-CM | POA: Diagnosis not present

## 2021-03-02 DIAGNOSIS — D849 Immunodeficiency, unspecified: Secondary | ICD-10-CM | POA: Diagnosis not present

## 2021-03-02 DIAGNOSIS — E785 Hyperlipidemia, unspecified: Secondary | ICD-10-CM | POA: Diagnosis not present

## 2021-03-02 DIAGNOSIS — I951 Orthostatic hypotension: Secondary | ICD-10-CM | POA: Diagnosis not present

## 2021-03-02 DIAGNOSIS — L209 Atopic dermatitis, unspecified: Secondary | ICD-10-CM | POA: Diagnosis not present

## 2021-03-02 DIAGNOSIS — E876 Hypokalemia: Secondary | ICD-10-CM | POA: Diagnosis not present

## 2021-03-02 DIAGNOSIS — Z949 Transplanted organ and tissue status, unspecified: Secondary | ICD-10-CM | POA: Diagnosis not present

## 2021-03-02 DIAGNOSIS — Z940 Kidney transplant status: Secondary | ICD-10-CM | POA: Diagnosis not present

## 2021-03-02 DIAGNOSIS — E861 Hypovolemia: Secondary | ICD-10-CM | POA: Diagnosis not present

## 2021-03-02 DIAGNOSIS — Z48816 Encounter for surgical aftercare following surgery on the genitourinary system: Secondary | ICD-10-CM | POA: Diagnosis not present

## 2021-03-02 DIAGNOSIS — Z792 Long term (current) use of antibiotics: Secondary | ICD-10-CM | POA: Diagnosis not present

## 2021-03-09 DIAGNOSIS — Z5181 Encounter for therapeutic drug level monitoring: Secondary | ICD-10-CM | POA: Diagnosis not present

## 2021-03-09 DIAGNOSIS — Z79621 Long term (current) use of calcineurin inhibitor: Secondary | ICD-10-CM | POA: Diagnosis not present

## 2021-03-09 DIAGNOSIS — I701 Atherosclerosis of renal artery: Secondary | ICD-10-CM | POA: Diagnosis not present

## 2021-03-09 DIAGNOSIS — Z949 Transplanted organ and tissue status, unspecified: Secondary | ICD-10-CM | POA: Diagnosis not present

## 2021-03-09 DIAGNOSIS — Z4822 Encounter for aftercare following kidney transplant: Secondary | ICD-10-CM | POA: Diagnosis not present

## 2021-03-09 DIAGNOSIS — D849 Immunodeficiency, unspecified: Secondary | ICD-10-CM | POA: Diagnosis not present

## 2021-03-09 DIAGNOSIS — Z940 Kidney transplant status: Secondary | ICD-10-CM | POA: Diagnosis not present

## 2021-03-09 DIAGNOSIS — N186 End stage renal disease: Secondary | ICD-10-CM | POA: Diagnosis not present

## 2021-03-13 DIAGNOSIS — Z8639 Personal history of other endocrine, nutritional and metabolic disease: Secondary | ICD-10-CM | POA: Diagnosis not present

## 2021-03-13 DIAGNOSIS — E785 Hyperlipidemia, unspecified: Secondary | ICD-10-CM | POA: Diagnosis not present

## 2021-03-13 DIAGNOSIS — D649 Anemia, unspecified: Secondary | ICD-10-CM | POA: Diagnosis not present

## 2021-03-13 DIAGNOSIS — Z9884 Bariatric surgery status: Secondary | ICD-10-CM | POA: Diagnosis not present

## 2021-03-13 DIAGNOSIS — Z85528 Personal history of other malignant neoplasm of kidney: Secondary | ICD-10-CM | POA: Diagnosis not present

## 2021-03-13 DIAGNOSIS — R590 Localized enlarged lymph nodes: Secondary | ICD-10-CM | POA: Diagnosis not present

## 2021-03-13 DIAGNOSIS — Z940 Kidney transplant status: Secondary | ICD-10-CM | POA: Diagnosis not present

## 2021-03-13 DIAGNOSIS — L209 Atopic dermatitis, unspecified: Secondary | ICD-10-CM | POA: Diagnosis not present

## 2021-03-13 DIAGNOSIS — Z949 Transplanted organ and tissue status, unspecified: Secondary | ICD-10-CM | POA: Diagnosis not present

## 2021-03-13 DIAGNOSIS — D8989 Other specified disorders involving the immune mechanism, not elsewhere classified: Secondary | ICD-10-CM | POA: Diagnosis not present

## 2021-03-13 DIAGNOSIS — Z4822 Encounter for aftercare following kidney transplant: Secondary | ICD-10-CM | POA: Diagnosis not present

## 2021-03-13 DIAGNOSIS — I10 Essential (primary) hypertension: Secondary | ICD-10-CM | POA: Diagnosis not present

## 2021-03-13 DIAGNOSIS — D2272 Melanocytic nevi of left lower limb, including hip: Secondary | ICD-10-CM | POA: Diagnosis not present

## 2021-03-13 DIAGNOSIS — E876 Hypokalemia: Secondary | ICD-10-CM | POA: Diagnosis not present

## 2021-03-13 DIAGNOSIS — I951 Orthostatic hypotension: Secondary | ICD-10-CM | POA: Diagnosis not present

## 2021-03-16 DIAGNOSIS — Z949 Transplanted organ and tissue status, unspecified: Secondary | ICD-10-CM | POA: Diagnosis not present

## 2021-03-16 DIAGNOSIS — D649 Anemia, unspecified: Secondary | ICD-10-CM | POA: Diagnosis not present

## 2021-03-16 DIAGNOSIS — R591 Generalized enlarged lymph nodes: Secondary | ICD-10-CM | POA: Diagnosis not present

## 2021-03-20 ENCOUNTER — Other Ambulatory Visit: Payer: Self-pay

## 2021-03-20 ENCOUNTER — Encounter (HOSPITAL_COMMUNITY): Payer: Self-pay | Admitting: Psychiatry

## 2021-03-20 ENCOUNTER — Telehealth (INDEPENDENT_AMBULATORY_CARE_PROVIDER_SITE_OTHER): Payer: Medicare PPO | Admitting: Psychiatry

## 2021-03-20 DIAGNOSIS — F322 Major depressive disorder, single episode, severe without psychotic features: Secondary | ICD-10-CM | POA: Diagnosis not present

## 2021-03-20 DIAGNOSIS — F411 Generalized anxiety disorder: Secondary | ICD-10-CM

## 2021-03-20 DIAGNOSIS — Z949 Transplanted organ and tissue status, unspecified: Secondary | ICD-10-CM | POA: Diagnosis not present

## 2021-03-20 MED ORDER — BUPROPION HCL ER (XL) 300 MG PO TB24: 300.0000 mg | ORAL_TABLET | ORAL | 2 refills | Status: DC

## 2021-03-20 MED ORDER — TRAZODONE HCL 100 MG PO TABS: 100.0000 mg | ORAL_TABLET | Freq: Every day | ORAL | 2 refills | Status: DC

## 2021-03-20 MED ORDER — ALPRAZOLAM 1 MG PO TABS
1.0000 mg | ORAL_TABLET | Freq: Two times a day (BID) | ORAL | 2 refills | Status: DC | PRN
Start: 2021-03-20 — End: 2021-06-19

## 2021-03-20 NOTE — Progress Notes (Signed)
Virtual Visit via Telephone Note  I connected with April Ward on 03/20/21 at  9:00 AM EST by telephone and verified that I am speaking with the correct person using two identifiers.  Location: Patient: home Provider: home office   I discussed the limitations, risks, security and privacy concerns of performing an evaluation and management service by telephone and the availability of in person appointments. I also discussed with the patient that there may be a patient responsible charge related to this service. The patient expressed understanding and agreed to proceed.      I discussed the assessment and treatment plan with the patient. The patient was provided an opportunity to ask questions and all were answered. The patient agreed with the plan and demonstrated an understanding of the instructions.   The patient was advised to call back or seek an in-person evaluation if the symptoms worsen or if the condition fails to improve as anticipated.  I provided 20 minutes of non-face-to-face time during this encounter.   [name], MD  [facility] MD/PA/NP OP Progress Note  03/20/2021 9:28 AM April Ward  MRN:  [id number]  Chief Complaint:  Chief Complaint   Anxiety; Depression; Follow-up    HPI: This patient is a 60 year old widowed black female who lives with her son and asked in [place].  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  The patient returns for follow-up after 3 months.  She was able to get her kidney transplant on October 27.  She states that the surgery went well and she has had no problems with any of the new medications she is on to prevent rejection.  She is very tired all the time and I imagine is going to take quite some time to get used to the new kidney.  She has been able to get off a lot of her medications such as all of her blood pressure pills.  She is taking prednisone +2 immunosuppressive's.  The Zoloft was discontinued by the renal  service.  The patient is still taking Wellbutrin and her mood has been generally good.  She is having more trouble sleeping so we will need to decrease her trazodone.  She rarely uses the Xanax.  She states that despite the fatigue she is very glad she got the kidney transplant. Visit Diagnosis:    ICD-10-CM   1. Major depressive disorder, single episode, severe without psychotic features ([place])  F32.2     2. GAD (generalized anxiety disorder)  F41.1       Past Psychiatric History: Long-term outpatient treatment  Past Medical History:  Past Medical History:  Diagnosis Date   Anxiety    Arthritis    Depression    Diabetes mellitus without complication (HCC)    GERD (gastroesophageal reflux disease)    Gout    Heart murmur    Hypertension    Renal cancer [facility])    Renal disease 10/2012   Renal insufficiency     Past Surgical History:  Procedure Laterality Date   CESAREAN SECTION  2500;3704   CHONDROPLASTY Right 08/22/2012   Procedure: CHONDROPLASTY;  Surgeon: Carole Civil, MD;  Location: [facility];  Service: Orthopedics;  Laterality: Right;   COLONOSCOPY WITH PROPOFOL N/A 03/16/2014   Procedure: COLONOSCOPY WITH PROPOFOL (at cecum 1023, total withdrawal time=9 minutes);  Surgeon: Danie Binder, MD;  Location: [facility];  Service: Endoscopy;  Laterality: N/A;   GASTRIC BYPASS     KNEE ARTHROSCOPY  WITH MEDIAL MENISECTOMY Right 08/22/2012   Procedure: KNEE ARTHROSCOPY WITH PARTIAL MEDIAL MENISECTOMY;  Surgeon: Carole Civil, MD;  Location: [facility];  Service: Orthopedics;  Laterality: Right;   PARTIAL NEPHRECTOMY  Dec 2014   left   TENDON REPAIR      Family Psychiatric History: see below  Family History:  Family History  Problem Relation Age of Onset   Heart attack Mother    Heart attack Father    Depression Paternal Aunt    Alcohol abuse Maternal Uncle    Colon cancer Maternal Aunt    Colon cancer Maternal Uncle     Social History:  Social History   Socioeconomic  History   Marital status: Widowed    Spouse name: Not on file   Number of children: Not on file   Years of education: Not on file   Highest education level: Not on file  Occupational History   Occupation: [employer]    Employer: [employer]  Tobacco Use   Smoking status: Never   Smokeless tobacco: Never  Substance and Sexual Activity   Alcohol use: No    Alcohol/week: 0.0 standard drinks   Drug use: No   Sexual activity: Not on file  Other Topics Concern   Not on file  Social History Narrative   Not on file   Social Determinants of Health   Financial Resource Strain: Not on file  Food Insecurity: Not on file  Transportation Needs: Not on file  Physical Activity: Not on file  Stress: Not on file  Social Connections: Not on file    Allergies:  Allergies  Allergen Reactions   Skelaxin [Metaxalone] Hives and Rash    Metabolic Disorder Labs: No results found for: HGBA1C, MPG No results found for: PROLACTIN Lab Results  Component Value Date   CHOL 215 (H) 03/11/2014   TRIG 288 (H) 03/11/2014   HDL 33 (L) 03/11/2014   CHOLHDL 6.5 03/11/2014   VLDL 58 (H) 03/11/2014   LDLCALC 124 (H) 03/11/2014   No results found for: TSH  Therapeutic Level Labs: No results found for: LITHIUM No results found for: VALPROATE No components found for:  CBMZ  Current Medications: Current Outpatient Medications  Medication Sig Dispense Refill   predniSONE (DELTASONE) 5 MG tablet Take 1 tablet by mouth daily.     tacrolimus (PROGRAF) 1 MG capsule Take by mouth.     traZODone (DESYREL) 100 MG tablet Take 1 tablet (100 mg total) by mouth at bedtime. 90 tablet 2   valGANciclovir (VALCYTE) 450 MG tablet Take by mouth.     ALPRAZolam (XANAX) 1 MG tablet Take 1 tablet (1 mg total) by mouth 2 (two) times daily as needed for anxiety. 180 tablet 2   buPROPion (WELLBUTRIN XL) 300 MG 24 hr tablet Take 1 tablet (300 mg total) by mouth every morning. 90 tablet 2   calcitRIOL  (ROCALTROL) 0.25 MCG capsule Take 1 capsule by mouth. 6 times weekly - Monday thru Saturday     levothyroxine (SYNTHROID, LEVOTHROID) 200 MCG tablet Take 1 tablet by mouth daily. X 6 days of the week & 320mcg on one day of the week     No current facility-administered medications for this visit.     Musculoskeletal: Strength & Muscle Tone: na Gait & Station: na Patient leans: N/A  Psychiatric Specialty Exam: Review of Systems  Constitutional:  Positive for fatigue.  Gastrointestinal:  Positive for nausea.  All other systems reviewed and are negative.  There were  no vitals taken for this visit.There is no height or weight on file to calculate BMI.  General Appearance: NA  Eye Contact:  NA  Speech:  Clear and Coherent  Volume:  Normal  Mood:  Euthymic  Affect:  NA  Thought Process:  Goal Directed  Orientation:  Full (Time, Place, and Person)  Thought Content: Rumination   Suicidal Thoughts:  No  Homicidal Thoughts:  No  Memory:  Immediate;   Good Recent;   Good Remote;   Good  Judgement:  Good  Insight:  Good  Psychomotor Activity:  Decreased  Concentration:  Concentration: Good and Attention Span: Good  Recall:  Good  Fund of Knowledge: Good  Language: Good  Akathisia:  No  Handed:  Right  AIMS (if indicated): not done  Assets:  Communication Skills Desire for Improvement Resilience Social Support Talents/Skills  ADL's:  Intact  Cognition: WNL  Sleep:  Poor   Screenings: PHQ2-9    Flowsheet Row Video Visit from 03/20/2021 in [place] [facility] Video Visit from 12/21/2020 in [place] [facility] Video Visit from 08/22/2020 in [place] [facility] Video Visit from 07/14/2020 in [place] [facility]  PHQ-2 Total Score 0 0 0 1      Flowsheet Row Video Visit from 03/20/2021 in [place] [facility]  Video Visit from 12/21/2020 in [place] [facility] Video Visit from 08/22/2020 in [place] [facility]  C-SSRS RISK CATEGORY No Risk No Risk No Risk        Assessment and Plan: Patient is a 60 year old female with a history of end-stage renal disease depression and anxiety.  She is not sleeping all that well so we will increase trazodone to 100 mg at bedtime.  She will continue Wellbutrin XL 300 mg daily for depression.  She does have the Xanax 1 mg to take as needed but she is rarely using it.  She will return to see me in 3 months   [name], MD 03/20/2021, 9:28 AM

## 2021-03-29 DIAGNOSIS — Z4822 Encounter for aftercare following kidney transplant: Secondary | ICD-10-CM | POA: Diagnosis not present

## 2021-03-29 DIAGNOSIS — E861 Hypovolemia: Secondary | ICD-10-CM | POA: Diagnosis not present

## 2021-03-29 DIAGNOSIS — Z9884 Bariatric surgery status: Secondary | ICD-10-CM | POA: Diagnosis not present

## 2021-03-29 DIAGNOSIS — N179 Acute kidney failure, unspecified: Secondary | ICD-10-CM | POA: Diagnosis not present

## 2021-03-29 DIAGNOSIS — Z949 Transplanted organ and tissue status, unspecified: Secondary | ICD-10-CM | POA: Diagnosis not present

## 2021-03-29 DIAGNOSIS — Z940 Kidney transplant status: Secondary | ICD-10-CM | POA: Diagnosis not present

## 2021-03-29 DIAGNOSIS — R001 Bradycardia, unspecified: Secondary | ICD-10-CM | POA: Diagnosis not present

## 2021-03-29 DIAGNOSIS — D849 Immunodeficiency, unspecified: Secondary | ICD-10-CM | POA: Diagnosis not present

## 2021-03-29 DIAGNOSIS — Z5181 Encounter for therapeutic drug level monitoring: Secondary | ICD-10-CM | POA: Diagnosis not present

## 2021-03-29 DIAGNOSIS — Z79899 Other long term (current) drug therapy: Secondary | ICD-10-CM | POA: Diagnosis not present

## 2021-03-29 DIAGNOSIS — I440 Atrioventricular block, first degree: Secondary | ICD-10-CM | POA: Diagnosis not present

## 2021-03-29 DIAGNOSIS — I447 Left bundle-branch block, unspecified: Secondary | ICD-10-CM | POA: Diagnosis not present

## 2021-03-29 DIAGNOSIS — E860 Dehydration: Secondary | ICD-10-CM | POA: Diagnosis not present

## 2021-03-29 DIAGNOSIS — T8619 Other complication of kidney transplant: Secondary | ICD-10-CM | POA: Diagnosis not present

## 2021-03-29 DIAGNOSIS — R112 Nausea with vomiting, unspecified: Secondary | ICD-10-CM | POA: Diagnosis not present

## 2021-03-29 DIAGNOSIS — Z79621 Long term (current) use of calcineurin inhibitor: Secondary | ICD-10-CM | POA: Diagnosis not present

## 2021-03-29 DIAGNOSIS — I10 Essential (primary) hypertension: Secondary | ICD-10-CM | POA: Diagnosis not present

## 2021-03-30 DIAGNOSIS — E860 Dehydration: Secondary | ICD-10-CM | POA: Diagnosis not present

## 2021-03-30 DIAGNOSIS — I447 Left bundle-branch block, unspecified: Secondary | ICD-10-CM | POA: Diagnosis not present

## 2021-03-30 DIAGNOSIS — N200 Calculus of kidney: Secondary | ICD-10-CM | POA: Diagnosis not present

## 2021-03-30 DIAGNOSIS — I10 Essential (primary) hypertension: Secondary | ICD-10-CM | POA: Diagnosis not present

## 2021-03-30 DIAGNOSIS — N179 Acute kidney failure, unspecified: Secondary | ICD-10-CM | POA: Diagnosis not present

## 2021-03-30 DIAGNOSIS — Z7952 Long term (current) use of systemic steroids: Secondary | ICD-10-CM | POA: Diagnosis not present

## 2021-03-30 DIAGNOSIS — Z4822 Encounter for aftercare following kidney transplant: Secondary | ICD-10-CM | POA: Diagnosis not present

## 2021-03-30 DIAGNOSIS — D631 Anemia in chronic kidney disease: Secondary | ICD-10-CM | POA: Diagnosis not present

## 2021-03-30 DIAGNOSIS — Z940 Kidney transplant status: Secondary | ICD-10-CM | POA: Diagnosis not present

## 2021-03-30 DIAGNOSIS — T8619 Other complication of kidney transplant: Secondary | ICD-10-CM | POA: Diagnosis not present

## 2021-03-30 DIAGNOSIS — I440 Atrioventricular block, first degree: Secondary | ICD-10-CM | POA: Diagnosis not present

## 2021-03-30 DIAGNOSIS — R112 Nausea with vomiting, unspecified: Secondary | ICD-10-CM | POA: Diagnosis not present

## 2021-03-30 DIAGNOSIS — E876 Hypokalemia: Secondary | ICD-10-CM | POA: Diagnosis not present

## 2021-03-30 DIAGNOSIS — Z792 Long term (current) use of antibiotics: Secondary | ICD-10-CM | POA: Diagnosis not present

## 2021-03-30 DIAGNOSIS — Z79899 Other long term (current) drug therapy: Secondary | ICD-10-CM | POA: Diagnosis not present

## 2021-03-30 DIAGNOSIS — K219 Gastro-esophageal reflux disease without esophagitis: Secondary | ICD-10-CM | POA: Diagnosis not present

## 2021-03-30 DIAGNOSIS — Z789 Other specified health status: Secondary | ICD-10-CM | POA: Diagnosis not present

## 2021-03-30 DIAGNOSIS — Z9884 Bariatric surgery status: Secondary | ICD-10-CM | POA: Diagnosis not present

## 2021-03-30 DIAGNOSIS — R001 Bradycardia, unspecified: Secondary | ICD-10-CM | POA: Diagnosis not present

## 2021-03-30 DIAGNOSIS — Z5181 Encounter for therapeutic drug level monitoring: Secondary | ICD-10-CM | POA: Diagnosis not present

## 2021-03-30 DIAGNOSIS — Z903 Acquired absence of stomach [part of]: Secondary | ICD-10-CM | POA: Diagnosis not present

## 2021-03-30 DIAGNOSIS — Z79621 Long term (current) use of calcineurin inhibitor: Secondary | ICD-10-CM | POA: Diagnosis not present

## 2021-03-31 DIAGNOSIS — K2289 Other specified disease of esophagus: Secondary | ICD-10-CM | POA: Diagnosis not present

## 2021-03-31 DIAGNOSIS — Z79621 Long term (current) use of calcineurin inhibitor: Secondary | ICD-10-CM | POA: Diagnosis not present

## 2021-03-31 DIAGNOSIS — N179 Acute kidney failure, unspecified: Secondary | ICD-10-CM | POA: Diagnosis not present

## 2021-03-31 DIAGNOSIS — Z792 Long term (current) use of antibiotics: Secondary | ICD-10-CM | POA: Diagnosis not present

## 2021-03-31 DIAGNOSIS — T8619 Other complication of kidney transplant: Secondary | ICD-10-CM | POA: Diagnosis not present

## 2021-03-31 DIAGNOSIS — I440 Atrioventricular block, first degree: Secondary | ICD-10-CM | POA: Diagnosis not present

## 2021-03-31 DIAGNOSIS — K2960 Other gastritis without bleeding: Secondary | ICD-10-CM | POA: Diagnosis not present

## 2021-03-31 DIAGNOSIS — Z949 Transplanted organ and tissue status, unspecified: Secondary | ICD-10-CM | POA: Diagnosis not present

## 2021-03-31 DIAGNOSIS — Z7952 Long term (current) use of systemic steroids: Secondary | ICD-10-CM | POA: Diagnosis not present

## 2021-03-31 DIAGNOSIS — R112 Nausea with vomiting, unspecified: Secondary | ICD-10-CM | POA: Diagnosis not present

## 2021-03-31 DIAGNOSIS — Z79899 Other long term (current) drug therapy: Secondary | ICD-10-CM | POA: Diagnosis not present

## 2021-03-31 DIAGNOSIS — E860 Dehydration: Secondary | ICD-10-CM | POA: Diagnosis not present

## 2021-03-31 DIAGNOSIS — Z940 Kidney transplant status: Secondary | ICD-10-CM | POA: Diagnosis not present

## 2021-03-31 DIAGNOSIS — I447 Left bundle-branch block, unspecified: Secondary | ICD-10-CM | POA: Diagnosis not present

## 2021-03-31 DIAGNOSIS — K2950 Unspecified chronic gastritis without bleeding: Secondary | ICD-10-CM | POA: Diagnosis not present

## 2021-03-31 DIAGNOSIS — Z4822 Encounter for aftercare following kidney transplant: Secondary | ICD-10-CM | POA: Diagnosis not present

## 2021-03-31 DIAGNOSIS — Z9884 Bariatric surgery status: Secondary | ICD-10-CM | POA: Diagnosis not present

## 2021-03-31 DIAGNOSIS — Z5181 Encounter for therapeutic drug level monitoring: Secondary | ICD-10-CM | POA: Diagnosis not present

## 2021-03-31 DIAGNOSIS — E876 Hypokalemia: Secondary | ICD-10-CM | POA: Diagnosis not present

## 2021-03-31 DIAGNOSIS — R001 Bradycardia, unspecified: Secondary | ICD-10-CM | POA: Diagnosis not present

## 2021-03-31 DIAGNOSIS — D631 Anemia in chronic kidney disease: Secondary | ICD-10-CM | POA: Diagnosis not present

## 2021-04-02 DIAGNOSIS — Z940 Kidney transplant status: Secondary | ICD-10-CM | POA: Diagnosis not present

## 2021-04-02 DIAGNOSIS — E860 Dehydration: Secondary | ICD-10-CM | POA: Diagnosis not present

## 2021-04-02 DIAGNOSIS — R112 Nausea with vomiting, unspecified: Secondary | ICD-10-CM | POA: Diagnosis not present

## 2021-04-06 DIAGNOSIS — Z85850 Personal history of malignant neoplasm of thyroid: Secondary | ICD-10-CM | POA: Diagnosis not present

## 2021-04-06 DIAGNOSIS — Z9884 Bariatric surgery status: Secondary | ICD-10-CM | POA: Diagnosis not present

## 2021-04-06 DIAGNOSIS — Z79621 Long term (current) use of calcineurin inhibitor: Secondary | ICD-10-CM | POA: Diagnosis not present

## 2021-04-06 DIAGNOSIS — Z79899 Other long term (current) drug therapy: Secondary | ICD-10-CM | POA: Diagnosis not present

## 2021-04-06 DIAGNOSIS — D649 Anemia, unspecified: Secondary | ICD-10-CM | POA: Diagnosis not present

## 2021-04-06 DIAGNOSIS — L2089 Other atopic dermatitis: Secondary | ICD-10-CM | POA: Diagnosis not present

## 2021-04-06 DIAGNOSIS — Z792 Long term (current) use of antibiotics: Secondary | ICD-10-CM | POA: Diagnosis not present

## 2021-04-06 DIAGNOSIS — E785 Hyperlipidemia, unspecified: Secondary | ICD-10-CM | POA: Diagnosis not present

## 2021-04-06 DIAGNOSIS — E119 Type 2 diabetes mellitus without complications: Secondary | ICD-10-CM | POA: Diagnosis not present

## 2021-04-06 DIAGNOSIS — I951 Orthostatic hypotension: Secondary | ICD-10-CM | POA: Diagnosis not present

## 2021-04-06 DIAGNOSIS — D8989 Other specified disorders involving the immune mechanism, not elsewhere classified: Secondary | ICD-10-CM | POA: Diagnosis not present

## 2021-04-06 DIAGNOSIS — I10 Essential (primary) hypertension: Secondary | ICD-10-CM | POA: Diagnosis not present

## 2021-04-06 DIAGNOSIS — Z4822 Encounter for aftercare following kidney transplant: Secondary | ICD-10-CM | POA: Diagnosis not present

## 2021-04-06 DIAGNOSIS — Z940 Kidney transplant status: Secondary | ICD-10-CM | POA: Diagnosis not present

## 2021-04-06 DIAGNOSIS — L308 Other specified dermatitis: Secondary | ICD-10-CM | POA: Diagnosis not present

## 2021-04-06 DIAGNOSIS — Z949 Transplanted organ and tissue status, unspecified: Secondary | ICD-10-CM | POA: Diagnosis not present

## 2021-04-06 DIAGNOSIS — Z7952 Long term (current) use of systemic steroids: Secondary | ICD-10-CM | POA: Diagnosis not present

## 2021-04-12 DIAGNOSIS — L209 Atopic dermatitis, unspecified: Secondary | ICD-10-CM | POA: Diagnosis not present

## 2021-04-12 DIAGNOSIS — D849 Immunodeficiency, unspecified: Secondary | ICD-10-CM | POA: Diagnosis not present

## 2021-04-12 DIAGNOSIS — E785 Hyperlipidemia, unspecified: Secondary | ICD-10-CM | POA: Diagnosis not present

## 2021-04-12 DIAGNOSIS — Z792 Long term (current) use of antibiotics: Secondary | ICD-10-CM | POA: Diagnosis not present

## 2021-04-12 DIAGNOSIS — I951 Orthostatic hypotension: Secondary | ICD-10-CM | POA: Diagnosis not present

## 2021-04-12 DIAGNOSIS — Z79621 Long term (current) use of calcineurin inhibitor: Secondary | ICD-10-CM | POA: Diagnosis not present

## 2021-04-12 DIAGNOSIS — E876 Hypokalemia: Secondary | ICD-10-CM | POA: Diagnosis not present

## 2021-04-12 DIAGNOSIS — D649 Anemia, unspecified: Secondary | ICD-10-CM | POA: Diagnosis not present

## 2021-04-12 DIAGNOSIS — Z940 Kidney transplant status: Secondary | ICD-10-CM | POA: Diagnosis not present

## 2021-04-12 DIAGNOSIS — Z4822 Encounter for aftercare following kidney transplant: Secondary | ICD-10-CM | POA: Diagnosis not present

## 2021-04-12 DIAGNOSIS — Z5181 Encounter for therapeutic drug level monitoring: Secondary | ICD-10-CM | POA: Diagnosis not present

## 2021-04-19 DIAGNOSIS — D649 Anemia, unspecified: Secondary | ICD-10-CM | POA: Diagnosis not present

## 2021-04-19 DIAGNOSIS — Z940 Kidney transplant status: Secondary | ICD-10-CM | POA: Diagnosis not present

## 2021-04-19 DIAGNOSIS — Z79621 Long term (current) use of calcineurin inhibitor: Secondary | ICD-10-CM | POA: Diagnosis not present

## 2021-04-19 DIAGNOSIS — R112 Nausea with vomiting, unspecified: Secondary | ICD-10-CM | POA: Diagnosis not present

## 2021-04-19 DIAGNOSIS — Z5181 Encounter for therapeutic drug level monitoring: Secondary | ICD-10-CM | POA: Diagnosis not present

## 2021-04-19 DIAGNOSIS — E860 Dehydration: Secondary | ICD-10-CM | POA: Diagnosis not present

## 2021-04-19 DIAGNOSIS — I951 Orthostatic hypotension: Secondary | ICD-10-CM | POA: Diagnosis not present

## 2021-04-19 DIAGNOSIS — Z4822 Encounter for aftercare following kidney transplant: Secondary | ICD-10-CM | POA: Diagnosis not present

## 2021-04-19 DIAGNOSIS — R7989 Other specified abnormal findings of blood chemistry: Secondary | ICD-10-CM | POA: Diagnosis not present

## 2021-04-19 DIAGNOSIS — E875 Hyperkalemia: Secondary | ICD-10-CM | POA: Diagnosis not present

## 2021-04-19 DIAGNOSIS — L209 Atopic dermatitis, unspecified: Secondary | ICD-10-CM | POA: Diagnosis not present

## 2021-04-19 DIAGNOSIS — E785 Hyperlipidemia, unspecified: Secondary | ICD-10-CM | POA: Diagnosis not present

## 2021-04-19 DIAGNOSIS — R251 Tremor, unspecified: Secondary | ICD-10-CM | POA: Diagnosis not present

## 2021-04-19 DIAGNOSIS — D849 Immunodeficiency, unspecified: Secondary | ICD-10-CM | POA: Diagnosis not present

## 2021-04-21 ENCOUNTER — Other Ambulatory Visit: Payer: Self-pay | Admitting: Cardiology

## 2021-04-21 DIAGNOSIS — R112 Nausea with vomiting, unspecified: Secondary | ICD-10-CM | POA: Diagnosis not present

## 2021-04-21 DIAGNOSIS — Z940 Kidney transplant status: Secondary | ICD-10-CM | POA: Diagnosis not present

## 2021-04-21 DIAGNOSIS — E860 Dehydration: Secondary | ICD-10-CM | POA: Diagnosis not present

## 2021-04-23 DIAGNOSIS — E860 Dehydration: Secondary | ICD-10-CM | POA: Diagnosis not present

## 2021-04-23 DIAGNOSIS — Z940 Kidney transplant status: Secondary | ICD-10-CM | POA: Diagnosis not present

## 2021-04-23 DIAGNOSIS — R112 Nausea with vomiting, unspecified: Secondary | ICD-10-CM | POA: Diagnosis not present

## 2021-04-25 DIAGNOSIS — Z940 Kidney transplant status: Secondary | ICD-10-CM | POA: Diagnosis not present

## 2021-04-25 DIAGNOSIS — R112 Nausea with vomiting, unspecified: Secondary | ICD-10-CM | POA: Diagnosis not present

## 2021-04-25 DIAGNOSIS — E860 Dehydration: Secondary | ICD-10-CM | POA: Diagnosis not present

## 2021-04-26 DIAGNOSIS — E890 Postprocedural hypothyroidism: Secondary | ICD-10-CM | POA: Diagnosis not present

## 2021-04-26 DIAGNOSIS — R634 Abnormal weight loss: Secondary | ICD-10-CM | POA: Diagnosis not present

## 2021-04-26 DIAGNOSIS — Z9884 Bariatric surgery status: Secondary | ICD-10-CM | POA: Diagnosis not present

## 2021-04-26 DIAGNOSIS — R251 Tremor, unspecified: Secondary | ICD-10-CM | POA: Diagnosis not present

## 2021-04-26 DIAGNOSIS — Z85850 Personal history of malignant neoplasm of thyroid: Secondary | ICD-10-CM | POA: Diagnosis not present

## 2021-04-26 DIAGNOSIS — D8989 Other specified disorders involving the immune mechanism, not elsewhere classified: Secondary | ICD-10-CM | POA: Diagnosis not present

## 2021-04-26 DIAGNOSIS — Z940 Kidney transplant status: Secondary | ICD-10-CM | POA: Diagnosis not present

## 2021-04-26 DIAGNOSIS — I10 Essential (primary) hypertension: Secondary | ICD-10-CM | POA: Diagnosis not present

## 2021-04-26 DIAGNOSIS — R112 Nausea with vomiting, unspecified: Secondary | ICD-10-CM | POA: Diagnosis not present

## 2021-04-26 DIAGNOSIS — R7989 Other specified abnormal findings of blood chemistry: Secondary | ICD-10-CM | POA: Diagnosis not present

## 2021-04-26 DIAGNOSIS — I951 Orthostatic hypotension: Secondary | ICD-10-CM | POA: Diagnosis not present

## 2021-04-26 DIAGNOSIS — L209 Atopic dermatitis, unspecified: Secondary | ICD-10-CM | POA: Diagnosis not present

## 2021-04-26 DIAGNOSIS — Z4822 Encounter for aftercare following kidney transplant: Secondary | ICD-10-CM | POA: Diagnosis not present

## 2021-04-26 DIAGNOSIS — D649 Anemia, unspecified: Secondary | ICD-10-CM | POA: Diagnosis not present

## 2021-04-26 DIAGNOSIS — R052 Subacute cough: Secondary | ICD-10-CM | POA: Diagnosis not present

## 2021-04-27 DIAGNOSIS — E860 Dehydration: Secondary | ICD-10-CM | POA: Diagnosis not present

## 2021-04-27 DIAGNOSIS — R112 Nausea with vomiting, unspecified: Secondary | ICD-10-CM | POA: Diagnosis not present

## 2021-04-27 DIAGNOSIS — Z940 Kidney transplant status: Secondary | ICD-10-CM | POA: Diagnosis not present

## 2021-04-29 DIAGNOSIS — E860 Dehydration: Secondary | ICD-10-CM | POA: Diagnosis not present

## 2021-04-29 DIAGNOSIS — R112 Nausea with vomiting, unspecified: Secondary | ICD-10-CM | POA: Diagnosis not present

## 2021-04-29 DIAGNOSIS — Z940 Kidney transplant status: Secondary | ICD-10-CM | POA: Diagnosis not present

## 2021-04-30 ENCOUNTER — Other Ambulatory Visit (HOSPITAL_COMMUNITY): Payer: Self-pay | Admitting: Psychiatry

## 2021-05-01 DIAGNOSIS — R112 Nausea with vomiting, unspecified: Secondary | ICD-10-CM | POA: Diagnosis not present

## 2021-05-01 DIAGNOSIS — E860 Dehydration: Secondary | ICD-10-CM | POA: Diagnosis not present

## 2021-05-01 DIAGNOSIS — Z940 Kidney transplant status: Secondary | ICD-10-CM | POA: Diagnosis not present

## 2021-05-03 ENCOUNTER — Other Ambulatory Visit: Payer: Self-pay | Admitting: Cardiology

## 2021-05-03 DIAGNOSIS — I951 Orthostatic hypotension: Secondary | ICD-10-CM | POA: Diagnosis not present

## 2021-05-03 DIAGNOSIS — Z79621 Long term (current) use of calcineurin inhibitor: Secondary | ICD-10-CM | POA: Diagnosis not present

## 2021-05-03 DIAGNOSIS — I120 Hypertensive chronic kidney disease with stage 5 chronic kidney disease or end stage renal disease: Secondary | ICD-10-CM | POA: Diagnosis not present

## 2021-05-03 DIAGNOSIS — D649 Anemia, unspecified: Secondary | ICD-10-CM | POA: Diagnosis not present

## 2021-05-03 DIAGNOSIS — R112 Nausea with vomiting, unspecified: Secondary | ICD-10-CM | POA: Diagnosis not present

## 2021-05-03 DIAGNOSIS — Z940 Kidney transplant status: Secondary | ICD-10-CM | POA: Diagnosis not present

## 2021-05-03 DIAGNOSIS — Z79899 Other long term (current) drug therapy: Secondary | ICD-10-CM | POA: Diagnosis not present

## 2021-05-03 DIAGNOSIS — Z4822 Encounter for aftercare following kidney transplant: Secondary | ICD-10-CM | POA: Diagnosis not present

## 2021-05-03 DIAGNOSIS — Z7952 Long term (current) use of systemic steroids: Secondary | ICD-10-CM | POA: Diagnosis not present

## 2021-05-03 DIAGNOSIS — Z792 Long term (current) use of antibiotics: Secondary | ICD-10-CM | POA: Diagnosis not present

## 2021-05-03 DIAGNOSIS — Z5181 Encounter for therapeutic drug level monitoring: Secondary | ICD-10-CM | POA: Diagnosis not present

## 2021-05-03 DIAGNOSIS — E876 Hypokalemia: Secondary | ICD-10-CM | POA: Diagnosis not present

## 2021-05-03 DIAGNOSIS — I10 Essential (primary) hypertension: Secondary | ICD-10-CM | POA: Diagnosis not present

## 2021-05-03 DIAGNOSIS — E1122 Type 2 diabetes mellitus with diabetic chronic kidney disease: Secondary | ICD-10-CM | POA: Diagnosis not present

## 2021-05-03 DIAGNOSIS — N186 End stage renal disease: Secondary | ICD-10-CM | POA: Diagnosis not present

## 2021-05-03 DIAGNOSIS — E860 Dehydration: Secondary | ICD-10-CM | POA: Diagnosis not present

## 2021-05-10 DIAGNOSIS — E1122 Type 2 diabetes mellitus with diabetic chronic kidney disease: Secondary | ICD-10-CM | POA: Diagnosis not present

## 2021-05-10 DIAGNOSIS — N186 End stage renal disease: Secondary | ICD-10-CM | POA: Diagnosis not present

## 2021-05-10 DIAGNOSIS — D649 Anemia, unspecified: Secondary | ICD-10-CM | POA: Diagnosis not present

## 2021-05-10 DIAGNOSIS — Z79899 Other long term (current) drug therapy: Secondary | ICD-10-CM | POA: Diagnosis not present

## 2021-05-10 DIAGNOSIS — Z4822 Encounter for aftercare following kidney transplant: Secondary | ICD-10-CM | POA: Diagnosis not present

## 2021-05-10 DIAGNOSIS — Z5181 Encounter for therapeutic drug level monitoring: Secondary | ICD-10-CM | POA: Diagnosis not present

## 2021-05-10 DIAGNOSIS — Z79621 Long term (current) use of calcineurin inhibitor: Secondary | ICD-10-CM | POA: Diagnosis not present

## 2021-05-10 DIAGNOSIS — Z792 Long term (current) use of antibiotics: Secondary | ICD-10-CM | POA: Diagnosis not present

## 2021-05-10 DIAGNOSIS — K219 Gastro-esophageal reflux disease without esophagitis: Secondary | ICD-10-CM | POA: Diagnosis not present

## 2021-05-10 DIAGNOSIS — I120 Hypertensive chronic kidney disease with stage 5 chronic kidney disease or end stage renal disease: Secondary | ICD-10-CM | POA: Diagnosis not present

## 2021-05-10 DIAGNOSIS — Z940 Kidney transplant status: Secondary | ICD-10-CM | POA: Diagnosis not present

## 2021-05-10 DIAGNOSIS — D849 Immunodeficiency, unspecified: Secondary | ICD-10-CM | POA: Diagnosis not present

## 2021-05-10 DIAGNOSIS — I951 Orthostatic hypotension: Secondary | ICD-10-CM | POA: Diagnosis not present

## 2021-05-10 DIAGNOSIS — Z7952 Long term (current) use of systemic steroids: Secondary | ICD-10-CM | POA: Diagnosis not present

## 2021-05-14 ENCOUNTER — Other Ambulatory Visit: Payer: Self-pay | Admitting: Cardiology

## 2021-05-16 DIAGNOSIS — F32A Depression, unspecified: Secondary | ICD-10-CM | POA: Diagnosis not present

## 2021-05-16 DIAGNOSIS — I10 Essential (primary) hypertension: Secondary | ICD-10-CM | POA: Diagnosis not present

## 2021-05-16 DIAGNOSIS — Z8639 Personal history of other endocrine, nutritional and metabolic disease: Secondary | ICD-10-CM | POA: Diagnosis not present

## 2021-05-17 DIAGNOSIS — I951 Orthostatic hypotension: Secondary | ICD-10-CM | POA: Diagnosis not present

## 2021-05-17 DIAGNOSIS — D8989 Other specified disorders involving the immune mechanism, not elsewhere classified: Secondary | ICD-10-CM | POA: Diagnosis not present

## 2021-05-17 DIAGNOSIS — Z7952 Long term (current) use of systemic steroids: Secondary | ICD-10-CM | POA: Diagnosis not present

## 2021-05-17 DIAGNOSIS — D649 Anemia, unspecified: Secondary | ICD-10-CM | POA: Diagnosis not present

## 2021-05-17 DIAGNOSIS — I10 Essential (primary) hypertension: Secondary | ICD-10-CM | POA: Diagnosis not present

## 2021-05-17 DIAGNOSIS — Z8639 Personal history of other endocrine, nutritional and metabolic disease: Secondary | ICD-10-CM | POA: Diagnosis not present

## 2021-05-17 DIAGNOSIS — L2089 Other atopic dermatitis: Secondary | ICD-10-CM | POA: Diagnosis not present

## 2021-05-17 DIAGNOSIS — Z4822 Encounter for aftercare following kidney transplant: Secondary | ICD-10-CM | POA: Diagnosis not present

## 2021-05-17 DIAGNOSIS — L209 Atopic dermatitis, unspecified: Secondary | ICD-10-CM | POA: Diagnosis not present

## 2021-05-17 DIAGNOSIS — Z79621 Long term (current) use of calcineurin inhibitor: Secondary | ICD-10-CM | POA: Diagnosis not present

## 2021-05-17 DIAGNOSIS — Z9884 Bariatric surgery status: Secondary | ICD-10-CM | POA: Diagnosis not present

## 2021-05-17 DIAGNOSIS — Z940 Kidney transplant status: Secondary | ICD-10-CM | POA: Diagnosis not present

## 2021-05-17 DIAGNOSIS — E119 Type 2 diabetes mellitus without complications: Secondary | ICD-10-CM | POA: Diagnosis not present

## 2021-05-31 DIAGNOSIS — R112 Nausea with vomiting, unspecified: Secondary | ICD-10-CM | POA: Diagnosis not present

## 2021-05-31 DIAGNOSIS — R627 Adult failure to thrive: Secondary | ICD-10-CM | POA: Diagnosis not present

## 2021-05-31 DIAGNOSIS — Z4822 Encounter for aftercare following kidney transplant: Secondary | ICD-10-CM | POA: Diagnosis not present

## 2021-05-31 DIAGNOSIS — Z9884 Bariatric surgery status: Secondary | ICD-10-CM | POA: Diagnosis not present

## 2021-05-31 DIAGNOSIS — D849 Immunodeficiency, unspecified: Secondary | ICD-10-CM | POA: Diagnosis not present

## 2021-05-31 DIAGNOSIS — E43 Unspecified severe protein-calorie malnutrition: Secondary | ICD-10-CM | POA: Diagnosis not present

## 2021-05-31 DIAGNOSIS — Z940 Kidney transplant status: Secondary | ICD-10-CM | POA: Diagnosis not present

## 2021-05-31 DIAGNOSIS — I951 Orthostatic hypotension: Secondary | ICD-10-CM | POA: Diagnosis not present

## 2021-05-31 DIAGNOSIS — Z79899 Other long term (current) drug therapy: Secondary | ICD-10-CM | POA: Diagnosis not present

## 2021-05-31 DIAGNOSIS — D649 Anemia, unspecified: Secondary | ICD-10-CM | POA: Diagnosis not present

## 2021-05-31 DIAGNOSIS — R634 Abnormal weight loss: Secondary | ICD-10-CM | POA: Diagnosis not present

## 2021-06-02 DIAGNOSIS — L209 Atopic dermatitis, unspecified: Secondary | ICD-10-CM | POA: Diagnosis not present

## 2021-06-02 DIAGNOSIS — D72818 Other decreased white blood cell count: Secondary | ICD-10-CM | POA: Diagnosis not present

## 2021-06-02 DIAGNOSIS — D849 Immunodeficiency, unspecified: Secondary | ICD-10-CM | POA: Diagnosis not present

## 2021-06-02 DIAGNOSIS — R112 Nausea with vomiting, unspecified: Secondary | ICD-10-CM | POA: Diagnosis not present

## 2021-06-02 DIAGNOSIS — B258 Other cytomegaloviral diseases: Secondary | ICD-10-CM | POA: Diagnosis not present

## 2021-06-02 DIAGNOSIS — N179 Acute kidney failure, unspecified: Secondary | ICD-10-CM | POA: Diagnosis not present

## 2021-06-02 DIAGNOSIS — E861 Hypovolemia: Secondary | ICD-10-CM | POA: Diagnosis not present

## 2021-06-02 DIAGNOSIS — B259 Cytomegaloviral disease, unspecified: Secondary | ICD-10-CM | POA: Diagnosis not present

## 2021-06-02 DIAGNOSIS — I951 Orthostatic hypotension: Secondary | ICD-10-CM | POA: Diagnosis not present

## 2021-06-02 DIAGNOSIS — R7989 Other specified abnormal findings of blood chemistry: Secondary | ICD-10-CM | POA: Diagnosis not present

## 2021-06-02 DIAGNOSIS — Z4822 Encounter for aftercare following kidney transplant: Secondary | ICD-10-CM | POA: Diagnosis not present

## 2021-06-02 DIAGNOSIS — D649 Anemia, unspecified: Secondary | ICD-10-CM | POA: Diagnosis not present

## 2021-06-02 DIAGNOSIS — Z940 Kidney transplant status: Secondary | ICD-10-CM | POA: Diagnosis not present

## 2021-06-05 DIAGNOSIS — B349 Viral infection, unspecified: Secondary | ICD-10-CM | POA: Diagnosis not present

## 2021-06-05 DIAGNOSIS — I10 Essential (primary) hypertension: Secondary | ICD-10-CM | POA: Diagnosis not present

## 2021-06-05 DIAGNOSIS — D649 Anemia, unspecified: Secondary | ICD-10-CM | POA: Diagnosis not present

## 2021-06-05 DIAGNOSIS — E876 Hypokalemia: Secondary | ICD-10-CM | POA: Diagnosis not present

## 2021-06-05 DIAGNOSIS — R627 Adult failure to thrive: Secondary | ICD-10-CM | POA: Diagnosis not present

## 2021-06-05 DIAGNOSIS — Z4822 Encounter for aftercare following kidney transplant: Secondary | ICD-10-CM | POA: Diagnosis not present

## 2021-06-05 DIAGNOSIS — B258 Other cytomegaloviral diseases: Secondary | ICD-10-CM | POA: Diagnosis not present

## 2021-06-05 DIAGNOSIS — K148 Other diseases of tongue: Secondary | ICD-10-CM | POA: Diagnosis not present

## 2021-06-05 DIAGNOSIS — E785 Hyperlipidemia, unspecified: Secondary | ICD-10-CM | POA: Diagnosis not present

## 2021-06-05 DIAGNOSIS — I951 Orthostatic hypotension: Secondary | ICD-10-CM | POA: Diagnosis not present

## 2021-06-05 DIAGNOSIS — Z940 Kidney transplant status: Secondary | ICD-10-CM | POA: Diagnosis not present

## 2021-06-05 DIAGNOSIS — L209 Atopic dermatitis, unspecified: Secondary | ICD-10-CM | POA: Diagnosis not present

## 2021-06-05 DIAGNOSIS — Z5181 Encounter for therapeutic drug level monitoring: Secondary | ICD-10-CM | POA: Diagnosis not present

## 2021-06-05 DIAGNOSIS — D849 Immunodeficiency, unspecified: Secondary | ICD-10-CM | POA: Diagnosis not present

## 2021-06-05 DIAGNOSIS — D72819 Decreased white blood cell count, unspecified: Secondary | ICD-10-CM | POA: Diagnosis not present

## 2021-06-05 DIAGNOSIS — Z79621 Long term (current) use of calcineurin inhibitor: Secondary | ICD-10-CM | POA: Diagnosis not present

## 2021-06-09 DIAGNOSIS — D8989 Other specified disorders involving the immune mechanism, not elsewhere classified: Secondary | ICD-10-CM | POA: Diagnosis not present

## 2021-06-09 DIAGNOSIS — Z940 Kidney transplant status: Secondary | ICD-10-CM | POA: Diagnosis not present

## 2021-06-09 DIAGNOSIS — D72819 Decreased white blood cell count, unspecified: Secondary | ICD-10-CM | POA: Diagnosis not present

## 2021-06-09 DIAGNOSIS — B259 Cytomegaloviral disease, unspecified: Secondary | ICD-10-CM | POA: Diagnosis not present

## 2021-06-09 DIAGNOSIS — Z4822 Encounter for aftercare following kidney transplant: Secondary | ICD-10-CM | POA: Diagnosis not present

## 2021-06-09 DIAGNOSIS — I951 Orthostatic hypotension: Secondary | ICD-10-CM | POA: Diagnosis not present

## 2021-06-09 DIAGNOSIS — D849 Immunodeficiency, unspecified: Secondary | ICD-10-CM | POA: Diagnosis not present

## 2021-06-09 DIAGNOSIS — N179 Acute kidney failure, unspecified: Secondary | ICD-10-CM | POA: Diagnosis not present

## 2021-06-09 DIAGNOSIS — R627 Adult failure to thrive: Secondary | ICD-10-CM | POA: Diagnosis not present

## 2021-06-09 DIAGNOSIS — B349 Viral infection, unspecified: Secondary | ICD-10-CM | POA: Diagnosis not present

## 2021-06-09 DIAGNOSIS — Z9884 Bariatric surgery status: Secondary | ICD-10-CM | POA: Diagnosis not present

## 2021-06-09 DIAGNOSIS — R7989 Other specified abnormal findings of blood chemistry: Secondary | ICD-10-CM | POA: Diagnosis not present

## 2021-06-09 DIAGNOSIS — D649 Anemia, unspecified: Secondary | ICD-10-CM | POA: Diagnosis not present

## 2021-06-09 DIAGNOSIS — E785 Hyperlipidemia, unspecified: Secondary | ICD-10-CM | POA: Diagnosis not present

## 2021-06-09 DIAGNOSIS — I10 Essential (primary) hypertension: Secondary | ICD-10-CM | POA: Diagnosis not present

## 2021-06-09 DIAGNOSIS — E119 Type 2 diabetes mellitus without complications: Secondary | ICD-10-CM | POA: Diagnosis not present

## 2021-06-12 DIAGNOSIS — I10 Essential (primary) hypertension: Secondary | ICD-10-CM | POA: Diagnosis not present

## 2021-06-12 DIAGNOSIS — D649 Anemia, unspecified: Secondary | ICD-10-CM | POA: Diagnosis not present

## 2021-06-12 DIAGNOSIS — Z4822 Encounter for aftercare following kidney transplant: Secondary | ICD-10-CM | POA: Diagnosis not present

## 2021-06-12 DIAGNOSIS — Z79621 Long term (current) use of calcineurin inhibitor: Secondary | ICD-10-CM | POA: Diagnosis not present

## 2021-06-12 DIAGNOSIS — Z940 Kidney transplant status: Secondary | ICD-10-CM | POA: Diagnosis not present

## 2021-06-12 DIAGNOSIS — R627 Adult failure to thrive: Secondary | ICD-10-CM | POA: Diagnosis not present

## 2021-06-12 DIAGNOSIS — B259 Cytomegaloviral disease, unspecified: Secondary | ICD-10-CM | POA: Diagnosis not present

## 2021-06-12 DIAGNOSIS — D72819 Decreased white blood cell count, unspecified: Secondary | ICD-10-CM | POA: Diagnosis not present

## 2021-06-12 DIAGNOSIS — Z5181 Encounter for therapeutic drug level monitoring: Secondary | ICD-10-CM | POA: Diagnosis not present

## 2021-06-12 DIAGNOSIS — I951 Orthostatic hypotension: Secondary | ICD-10-CM | POA: Diagnosis not present

## 2021-06-12 DIAGNOSIS — D849 Immunodeficiency, unspecified: Secondary | ICD-10-CM | POA: Diagnosis not present

## 2021-06-15 DIAGNOSIS — B259 Cytomegaloviral disease, unspecified: Secondary | ICD-10-CM | POA: Diagnosis not present

## 2021-06-15 DIAGNOSIS — D72819 Decreased white blood cell count, unspecified: Secondary | ICD-10-CM | POA: Diagnosis not present

## 2021-06-15 DIAGNOSIS — Z9884 Bariatric surgery status: Secondary | ICD-10-CM | POA: Diagnosis not present

## 2021-06-15 DIAGNOSIS — Z8639 Personal history of other endocrine, nutritional and metabolic disease: Secondary | ICD-10-CM | POA: Diagnosis not present

## 2021-06-15 DIAGNOSIS — B258 Other cytomegaloviral diseases: Secondary | ICD-10-CM | POA: Diagnosis not present

## 2021-06-15 DIAGNOSIS — Z4822 Encounter for aftercare following kidney transplant: Secondary | ICD-10-CM | POA: Diagnosis not present

## 2021-06-15 DIAGNOSIS — R112 Nausea with vomiting, unspecified: Secondary | ICD-10-CM | POA: Diagnosis not present

## 2021-06-15 DIAGNOSIS — D849 Immunodeficiency, unspecified: Secondary | ICD-10-CM | POA: Diagnosis not present

## 2021-06-15 DIAGNOSIS — D649 Anemia, unspecified: Secondary | ICD-10-CM | POA: Diagnosis not present

## 2021-06-15 DIAGNOSIS — I151 Hypertension secondary to other renal disorders: Secondary | ICD-10-CM | POA: Diagnosis not present

## 2021-06-15 DIAGNOSIS — Z940 Kidney transplant status: Secondary | ICD-10-CM | POA: Diagnosis not present

## 2021-06-15 DIAGNOSIS — I951 Orthostatic hypotension: Secondary | ICD-10-CM | POA: Diagnosis not present

## 2021-06-15 DIAGNOSIS — N2889 Other specified disorders of kidney and ureter: Secondary | ICD-10-CM | POA: Diagnosis not present

## 2021-06-15 DIAGNOSIS — R627 Adult failure to thrive: Secondary | ICD-10-CM | POA: Diagnosis not present

## 2021-06-15 DIAGNOSIS — Z5181 Encounter for therapeutic drug level monitoring: Secondary | ICD-10-CM | POA: Diagnosis not present

## 2021-06-15 DIAGNOSIS — E1122 Type 2 diabetes mellitus with diabetic chronic kidney disease: Secondary | ICD-10-CM | POA: Diagnosis not present

## 2021-06-15 DIAGNOSIS — E869 Volume depletion, unspecified: Secondary | ICD-10-CM | POA: Diagnosis not present

## 2021-06-19 ENCOUNTER — Other Ambulatory Visit: Payer: Self-pay

## 2021-06-19 ENCOUNTER — Telehealth (INDEPENDENT_AMBULATORY_CARE_PROVIDER_SITE_OTHER): Payer: Medicare PPO | Admitting: Psychiatry

## 2021-06-19 ENCOUNTER — Encounter (HOSPITAL_COMMUNITY): Payer: Self-pay | Admitting: Psychiatry

## 2021-06-19 DIAGNOSIS — I951 Orthostatic hypotension: Secondary | ICD-10-CM | POA: Diagnosis not present

## 2021-06-19 DIAGNOSIS — Z79621 Long term (current) use of calcineurin inhibitor: Secondary | ICD-10-CM | POA: Diagnosis not present

## 2021-06-19 DIAGNOSIS — Z9884 Bariatric surgery status: Secondary | ICD-10-CM | POA: Diagnosis not present

## 2021-06-19 DIAGNOSIS — Z5181 Encounter for therapeutic drug level monitoring: Secondary | ICD-10-CM | POA: Diagnosis not present

## 2021-06-19 DIAGNOSIS — R627 Adult failure to thrive: Secondary | ICD-10-CM | POA: Diagnosis not present

## 2021-06-19 DIAGNOSIS — Z7952 Long term (current) use of systemic steroids: Secondary | ICD-10-CM | POA: Diagnosis not present

## 2021-06-19 DIAGNOSIS — F411 Generalized anxiety disorder: Secondary | ICD-10-CM | POA: Diagnosis not present

## 2021-06-19 DIAGNOSIS — Z4822 Encounter for aftercare following kidney transplant: Secondary | ICD-10-CM | POA: Diagnosis not present

## 2021-06-19 DIAGNOSIS — B258 Other cytomegaloviral diseases: Secondary | ICD-10-CM | POA: Diagnosis not present

## 2021-06-19 DIAGNOSIS — D72819 Decreased white blood cell count, unspecified: Secondary | ICD-10-CM | POA: Diagnosis not present

## 2021-06-19 DIAGNOSIS — F322 Major depressive disorder, single episode, severe without psychotic features: Secondary | ICD-10-CM

## 2021-06-19 DIAGNOSIS — E46 Unspecified protein-calorie malnutrition: Secondary | ICD-10-CM | POA: Diagnosis not present

## 2021-06-19 DIAGNOSIS — Z792 Long term (current) use of antibiotics: Secondary | ICD-10-CM | POA: Diagnosis not present

## 2021-06-19 DIAGNOSIS — R2689 Other abnormalities of gait and mobility: Secondary | ICD-10-CM | POA: Diagnosis not present

## 2021-06-19 DIAGNOSIS — Z79899 Other long term (current) drug therapy: Secondary | ICD-10-CM | POA: Diagnosis not present

## 2021-06-19 DIAGNOSIS — E869 Volume depletion, unspecified: Secondary | ICD-10-CM | POA: Diagnosis not present

## 2021-06-19 DIAGNOSIS — D509 Iron deficiency anemia, unspecified: Secondary | ICD-10-CM | POA: Diagnosis not present

## 2021-06-19 DIAGNOSIS — Z940 Kidney transplant status: Secondary | ICD-10-CM | POA: Diagnosis not present

## 2021-06-19 MED ORDER — TRAZODONE HCL 150 MG PO TABS: 150.0000 mg | ORAL_TABLET | Freq: Every day | ORAL | 2 refills | Status: DC

## 2021-06-19 MED ORDER — BUPROPION HCL ER (XL) 300 MG PO TB24: 300.0000 mg | ORAL_TABLET | ORAL | 2 refills | Status: DC

## 2021-06-19 MED ORDER — ALPRAZOLAM 1 MG PO TABS: 1.0000 mg | ORAL_TABLET | Freq: Two times a day (BID) | ORAL | 2 refills | Status: DC | PRN

## 2021-06-19 NOTE — Progress Notes (Signed)
Virtual Visit via Telephone Note ? ?I connected with April Ward on 06/19/21 at 10:00 AM EDT by telephone and verified that I am speaking with the correct person using two identifiers. ? ?Location: ?Patient: home ?Provider: office ?  ?I discussed the limitations, risks, security and privacy concerns of performing an evaluation and management service by telephone and the availability of in person appointments. I also discussed with the patient that there may be a patient responsible charge related to this service. The patient expressed understanding and agreed to proceed. ? ? ? ?  ?I discussed the assessment and treatment plan with the patient. The patient was provided an opportunity to ask questions and all were answered. The patient agreed with the plan and demonstrated an understanding of the instructions. ?  ?The patient was advised to call back or seek an in-person evaluation if the symptoms worsen or if the condition fails to improve as anticipated. ? ?I provided 15 minutes of non-face-to-face time during this encounter. ? ? ?[name], MD ? ?BH MD/PA/NP OP Progress Note ? ?06/19/2021 10:20 AM ?April Ward  ?MRN:  [id number] ? ?Chief Complaint:  ?Chief Complaint  ?Patient presents with  ? Depression  ? Anxiety  ? Follow-up  ? ?HPI: This patient is a 61 year old widowed black female who lives with her son in [place].  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 ? ?The patient returns for follow-up after 3 months.  She told me last time that she had a kidney transplant at [facility] on October 27.  She still having complications from this.  She is having orthostatic hypotension.  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  She is constantly going back and forth to the lab.  They want her to drink 3 L of water a day which is very difficult.  She is still on prednisone and Prograf.  Her prazosin was discontinued but she is not having nightmares but  is having a lot of trouble getting to sleep. ? ?She states that she is somewhat depressed but more just frustrated with the situation.  She was hoping that the transplant would alleviate problems but it seems to have caused others.  She is so fatigued that she cannot do much.  She enjoys gardening but they do not want her working in the dirt for fear of contracting some sort of illness.  She denies any thoughts of self-harm or suicide.  Given all the changes she is going through I do not want to make too many changes today and she agrees.  However I will increase the trazodone so that she can get a bit more sleep. ?Visit Diagnosis:  ?  ICD-10-CM   ?1. Major depressive disorder, single episode, severe without psychotic features ([place])  F32.2   ?  ?2. GAD (generalized anxiety disorder)  F41.1   ?  ? ? ?Past Psychiatric History: Long-term outpatient treatment ? ?Past Medical History:  ?Past Medical History:  ?Diagnosis Date  ? Anxiety   ? Arthritis   ? Depression   ? Diabetes mellitus without complication ([place])   ? GERD (gastroesophageal reflux disease)   ? Gout   ? Heart murmur   ? Hypertension   ? Renal cancer ([place])   ? Renal disease 10/2012  ? Renal insufficiency   ?  ?Past Surgical History:  ?Procedure Laterality Date  ? CESAREAN SECTION  [phone]  ? CHONDROPLASTY Right 08/22/2012  ?  Procedure: CHONDROPLASTY;  Surgeon: Carole Civil, MD;  Location: [facility];  Service: Orthopedics;  Laterality: Right;  ? COLONOSCOPY WITH PROPOFOL N/A 03/16/2014  ? Procedure: COLONOSCOPY WITH PROPOFOL (at cecum 1023, total withdrawal time=9 minutes);  Surgeon: Danie Binder, MD;  Location: [facility];  Service: Endoscopy;  Laterality: N/A;  ? GASTRIC BYPASS    ? KNEE ARTHROSCOPY WITH MEDIAL MENISECTOMY Right 08/22/2012  ? Procedure: KNEE ARTHROSCOPY WITH PARTIAL MEDIAL MENISECTOMY;  Surgeon: Carole Civil, MD;  Location: [facility];  Service: Orthopedics;  Laterality: Right;  ? PARTIAL NEPHRECTOMY  Dec 2014  ? left  ? TENDON REPAIR     ? ? ?Family Psychiatric History: see below ? ?Family History:  ?Family History  ?Problem Relation Age of Onset  ? Heart attack Mother   ? Heart attack Father   ? Depression Paternal Aunt   ? Alcohol abuse Maternal Uncle   ? Colon cancer Maternal Aunt   ? Colon cancer Maternal Uncle   ? ? ?Social History:  ?Social History  ? ?Socioeconomic History  ? Marital status: Widowed  ?  Spouse name: Not on file  ? Number of children: Not on file  ? Years of education: Not on file  ? Highest education level: Not on file  ?Occupational History  ? Occupation: [employer]  ?  Employer: [employer]  ?Tobacco Use  ? Smoking status: Never  ? Smokeless tobacco: Never  ?Substance and Sexual Activity  ? Alcohol use: No  ?  Alcohol/week: 0.0 standard drinks  ? Drug use: No  ? Sexual activity: Not on file  ?Other Topics Concern  ? Not on file  ?Social History Narrative  ? Not on file  ? ?Social Determinants of Health  ? ?Financial Resource Strain: Not on file  ?Food Insecurity: Not on file  ?Transportation Needs: Not on file  ?Physical Activity: Not on file  ?Stress: Not on file  ?Social Connections: Not on file  ? ? ?Allergies:  ?Allergies  ?Allergen Reactions  ? Skelaxin [Metaxalone] Hives and Rash  ? ? ?Metabolic Disorder Labs: ?No results found for: HGBA1C, MPG ?No results found for: PROLACTIN ?Lab Results  ?Component Value Date  ? CHOL 215 (H) 03/11/2014  ? TRIG 288 (H) 03/11/2014  ? HDL 33 (L) 03/11/2014  ? CHOLHDL 6.5 03/11/2014  ? VLDL 58 (H) 03/11/2014  ? LDLCALC 124 (H) 03/11/2014  ? ?No results found for: TSH ? ?Therapeutic Level Labs: ?No results found for: LITHIUM ?No results found for: VALPROATE ?No components found for:  CBMZ ? ?Current Medications: ?Current Outpatient Medications  ?Medication Sig Dispense Refill  ? traZODone (DESYREL) 150 MG tablet Take 1 tablet (150 mg total) by mouth at bedtime. 30 tablet 2  ? ALPRAZolam (XANAX) 1 MG tablet Take 1 tablet (1 mg total) by mouth 2 (two) times daily as  needed for anxiety. 60 tablet 2  ? buPROPion (WELLBUTRIN XL) 300 MG 24 hr tablet Take 1 tablet (300 mg total) by mouth every morning. 90 tablet 2  ? calcitRIOL (ROCALTROL) 0.25 MCG capsule Take 1 capsule by mouth. 6 times weekly - Monday thru Saturday    ? hydrALAZINE (APRESOLINE) 100 MG tablet TAKE 1 TABLET THREE TIMES DAILY 90 tablet 0  ? labetalol (NORMODYNE) 100 MG tablet Take 1 tablet (100 mg total) by mouth 2 (two) times daily. 60 tablet 0  ? levothyroxine (SYNTHROID, LEVOTHROID) 200 MCG tablet Take 1 tablet by mouth daily. X 6 days of the week & 322mg on  one day of the week    ? losartan (COZAAR) 100 MG tablet TAKE 1 TABLET EVERY DAY ; DOSE INCREASE (NEED FOLLOW UP APPT FOR FURTHER REFILLS) 30 tablet 0  ? tacrolimus (PROGRAF) 1 MG capsule Take by mouth.    ? ?No current facility-administered medications for this visit.  ? ? ? ?Musculoskeletal: ?Strength & Muscle Tone: na ?Gait & Station: na ?Patient leans: N/A ? ?Psychiatric Specialty Exam: ?Review of Systems  ?Constitutional:  Positive for fatigue.  ?Musculoskeletal:  Positive for back pain.  ?Psychiatric/Behavioral:  Positive for dysphoric mood.   ?All other systems reviewed and are negative.  ?There were no vitals taken for this visit.There is no height or weight on file to calculate BMI.  ?General Appearance: NA  ?Eye Contact:  NA  ?Speech:  Clear and Coherent  ?Volume:  Normal  ?Mood:  Dysphoric  ?Affect:  NA  ?Thought Process:  Goal Directed  ?Orientation:  Full (Time, Place, and Person)  ?Thought Content: Rumination   ?Suicidal Thoughts:  No  ?Homicidal Thoughts:  No  ?Memory:  Immediate;   Good ?Recent;   Good ?Remote;   Good  ?Judgement:  Good  ?Insight:  Good  ?Psychomotor Activity:  Decreased  ?Concentration:  Concentration: Poor and Attention Span: Poor  ?Recall:  Good  ?Fund of Knowledge: Good  ?Language: Good  ?Akathisia:  No  ?Handed:  Right  ?AIMS (if indicated): not done  ?Assets:  Communication Skills ?Desire for  Improvement ?Resilience ?Social Support ?Talents/Skills  ?ADL's:  Intact  ?Cognition: WNL  ?Sleep:  Poor  ? ?Screenings: ?PHQ2-9   ? ?Flowsheet Row Video Visit from 06/19/2021 in [place]

## 2021-06-22 DIAGNOSIS — R112 Nausea with vomiting, unspecified: Secondary | ICD-10-CM | POA: Diagnosis not present

## 2021-06-22 DIAGNOSIS — R627 Adult failure to thrive: Secondary | ICD-10-CM | POA: Diagnosis not present

## 2021-06-22 DIAGNOSIS — D849 Immunodeficiency, unspecified: Secondary | ICD-10-CM | POA: Diagnosis not present

## 2021-06-22 DIAGNOSIS — E869 Volume depletion, unspecified: Secondary | ICD-10-CM | POA: Diagnosis not present

## 2021-06-22 DIAGNOSIS — Z79621 Long term (current) use of calcineurin inhibitor: Secondary | ICD-10-CM | POA: Diagnosis not present

## 2021-06-22 DIAGNOSIS — Z940 Kidney transplant status: Secondary | ICD-10-CM | POA: Diagnosis not present

## 2021-06-22 DIAGNOSIS — K219 Gastro-esophageal reflux disease without esophagitis: Secondary | ICD-10-CM | POA: Diagnosis not present

## 2021-06-22 DIAGNOSIS — R7989 Other specified abnormal findings of blood chemistry: Secondary | ICD-10-CM | POA: Diagnosis not present

## 2021-06-22 DIAGNOSIS — I951 Orthostatic hypotension: Secondary | ICD-10-CM | POA: Diagnosis not present

## 2021-06-22 DIAGNOSIS — Z79899 Other long term (current) drug therapy: Secondary | ICD-10-CM | POA: Diagnosis not present

## 2021-06-22 DIAGNOSIS — B349 Viral infection, unspecified: Secondary | ICD-10-CM | POA: Diagnosis not present

## 2021-06-22 DIAGNOSIS — Z7952 Long term (current) use of systemic steroids: Secondary | ICD-10-CM | POA: Diagnosis not present

## 2021-06-22 DIAGNOSIS — Z4822 Encounter for aftercare following kidney transplant: Secondary | ICD-10-CM | POA: Diagnosis not present

## 2021-06-22 DIAGNOSIS — Z5181 Encounter for therapeutic drug level monitoring: Secondary | ICD-10-CM | POA: Diagnosis not present

## 2021-06-26 DIAGNOSIS — B259 Cytomegaloviral disease, unspecified: Secondary | ICD-10-CM | POA: Diagnosis not present

## 2021-06-26 DIAGNOSIS — R627 Adult failure to thrive: Secondary | ICD-10-CM | POA: Diagnosis not present

## 2021-06-26 DIAGNOSIS — D849 Immunodeficiency, unspecified: Secondary | ICD-10-CM | POA: Diagnosis not present

## 2021-06-26 DIAGNOSIS — I951 Orthostatic hypotension: Secondary | ICD-10-CM | POA: Diagnosis not present

## 2021-06-26 DIAGNOSIS — Z4822 Encounter for aftercare following kidney transplant: Secondary | ICD-10-CM | POA: Diagnosis not present

## 2021-06-26 DIAGNOSIS — N179 Acute kidney failure, unspecified: Secondary | ICD-10-CM | POA: Diagnosis not present

## 2021-06-26 DIAGNOSIS — Z940 Kidney transplant status: Secondary | ICD-10-CM | POA: Diagnosis not present

## 2021-06-26 DIAGNOSIS — E46 Unspecified protein-calorie malnutrition: Secondary | ICD-10-CM | POA: Diagnosis not present

## 2021-06-26 DIAGNOSIS — D649 Anemia, unspecified: Secondary | ICD-10-CM | POA: Diagnosis not present

## 2021-06-26 DIAGNOSIS — R2689 Other abnormalities of gait and mobility: Secondary | ICD-10-CM | POA: Diagnosis not present

## 2021-06-26 DIAGNOSIS — Z79899 Other long term (current) drug therapy: Secondary | ICD-10-CM | POA: Diagnosis not present

## 2021-06-26 DIAGNOSIS — Z9884 Bariatric surgery status: Secondary | ICD-10-CM | POA: Diagnosis not present

## 2021-06-26 DIAGNOSIS — R112 Nausea with vomiting, unspecified: Secondary | ICD-10-CM | POA: Diagnosis not present

## 2021-06-29 DIAGNOSIS — R634 Abnormal weight loss: Secondary | ICD-10-CM | POA: Diagnosis not present

## 2021-06-29 DIAGNOSIS — R627 Adult failure to thrive: Secondary | ICD-10-CM | POA: Diagnosis not present

## 2021-06-29 DIAGNOSIS — I10 Essential (primary) hypertension: Secondary | ICD-10-CM | POA: Diagnosis not present

## 2021-06-29 DIAGNOSIS — E861 Hypovolemia: Secondary | ICD-10-CM | POA: Diagnosis not present

## 2021-06-29 DIAGNOSIS — R112 Nausea with vomiting, unspecified: Secondary | ICD-10-CM | POA: Diagnosis not present

## 2021-06-29 DIAGNOSIS — Z940 Kidney transplant status: Secondary | ICD-10-CM | POA: Diagnosis not present

## 2021-06-29 DIAGNOSIS — Z4822 Encounter for aftercare following kidney transplant: Secondary | ICD-10-CM | POA: Diagnosis not present

## 2021-06-29 DIAGNOSIS — D849 Immunodeficiency, unspecified: Secondary | ICD-10-CM | POA: Diagnosis not present

## 2021-06-29 DIAGNOSIS — R509 Fever, unspecified: Secondary | ICD-10-CM | POA: Diagnosis not present

## 2021-06-29 DIAGNOSIS — B259 Cytomegaloviral disease, unspecified: Secondary | ICD-10-CM | POA: Diagnosis not present

## 2021-06-29 DIAGNOSIS — I951 Orthostatic hypotension: Secondary | ICD-10-CM | POA: Diagnosis not present

## 2021-06-29 DIAGNOSIS — R7989 Other specified abnormal findings of blood chemistry: Secondary | ICD-10-CM | POA: Diagnosis not present

## 2021-06-30 DIAGNOSIS — I10 Essential (primary) hypertension: Secondary | ICD-10-CM | POA: Diagnosis not present

## 2021-06-30 DIAGNOSIS — N186 End stage renal disease: Secondary | ICD-10-CM | POA: Diagnosis not present

## 2021-07-03 DIAGNOSIS — Z79899 Other long term (current) drug therapy: Secondary | ICD-10-CM | POA: Diagnosis not present

## 2021-07-03 DIAGNOSIS — Z940 Kidney transplant status: Secondary | ICD-10-CM | POA: Diagnosis not present

## 2021-07-03 DIAGNOSIS — Z4822 Encounter for aftercare following kidney transplant: Secondary | ICD-10-CM | POA: Diagnosis not present

## 2021-07-03 DIAGNOSIS — N183 Chronic kidney disease, stage 3 unspecified: Secondary | ICD-10-CM | POA: Diagnosis not present

## 2021-07-03 DIAGNOSIS — N184 Chronic kidney disease, stage 4 (severe): Secondary | ICD-10-CM | POA: Diagnosis not present

## 2021-07-03 DIAGNOSIS — N1832 Chronic kidney disease, stage 3b: Secondary | ICD-10-CM | POA: Diagnosis not present

## 2021-07-03 DIAGNOSIS — N2889 Other specified disorders of kidney and ureter: Secondary | ICD-10-CM | POA: Diagnosis not present

## 2021-07-03 DIAGNOSIS — B259 Cytomegaloviral disease, unspecified: Secondary | ICD-10-CM | POA: Diagnosis not present

## 2021-07-03 DIAGNOSIS — E1122 Type 2 diabetes mellitus with diabetic chronic kidney disease: Secondary | ICD-10-CM | POA: Diagnosis not present

## 2021-07-03 DIAGNOSIS — I151 Hypertension secondary to other renal disorders: Secondary | ICD-10-CM | POA: Diagnosis not present

## 2021-07-03 DIAGNOSIS — N186 End stage renal disease: Secondary | ICD-10-CM | POA: Diagnosis not present

## 2021-07-03 DIAGNOSIS — D631 Anemia in chronic kidney disease: Secondary | ICD-10-CM | POA: Diagnosis not present

## 2021-07-03 DIAGNOSIS — D849 Immunodeficiency, unspecified: Secondary | ICD-10-CM | POA: Diagnosis not present

## 2021-07-06 DIAGNOSIS — N186 End stage renal disease: Secondary | ICD-10-CM | POA: Diagnosis not present

## 2021-07-06 DIAGNOSIS — K219 Gastro-esophageal reflux disease without esophagitis: Secondary | ICD-10-CM | POA: Diagnosis not present

## 2021-07-06 DIAGNOSIS — R198 Other specified symptoms and signs involving the digestive system and abdomen: Secondary | ICD-10-CM | POA: Diagnosis not present

## 2021-07-06 DIAGNOSIS — Z940 Kidney transplant status: Secondary | ICD-10-CM | POA: Diagnosis not present

## 2021-07-06 DIAGNOSIS — E890 Postprocedural hypothyroidism: Secondary | ICD-10-CM | POA: Diagnosis not present

## 2021-07-06 DIAGNOSIS — K3189 Other diseases of stomach and duodenum: Secondary | ICD-10-CM | POA: Diagnosis not present

## 2021-07-06 DIAGNOSIS — K8689 Other specified diseases of pancreas: Secondary | ICD-10-CM | POA: Diagnosis not present

## 2021-07-06 DIAGNOSIS — Z79899 Other long term (current) drug therapy: Secondary | ICD-10-CM | POA: Diagnosis not present

## 2021-07-06 DIAGNOSIS — Z4822 Encounter for aftercare following kidney transplant: Secondary | ICD-10-CM | POA: Diagnosis not present

## 2021-07-06 DIAGNOSIS — B259 Cytomegaloviral disease, unspecified: Secondary | ICD-10-CM | POA: Diagnosis not present

## 2021-07-10 DIAGNOSIS — R112 Nausea with vomiting, unspecified: Secondary | ICD-10-CM | POA: Diagnosis not present

## 2021-07-10 DIAGNOSIS — R7989 Other specified abnormal findings of blood chemistry: Secondary | ICD-10-CM | POA: Diagnosis not present

## 2021-07-10 DIAGNOSIS — Z4822 Encounter for aftercare following kidney transplant: Secondary | ICD-10-CM | POA: Diagnosis not present

## 2021-07-10 DIAGNOSIS — K219 Gastro-esophageal reflux disease without esophagitis: Secondary | ICD-10-CM | POA: Diagnosis not present

## 2021-07-10 DIAGNOSIS — Z940 Kidney transplant status: Secondary | ICD-10-CM | POA: Diagnosis not present

## 2021-07-10 DIAGNOSIS — R627 Adult failure to thrive: Secondary | ICD-10-CM | POA: Diagnosis not present

## 2021-07-10 DIAGNOSIS — R509 Fever, unspecified: Secondary | ICD-10-CM | POA: Diagnosis not present

## 2021-07-10 DIAGNOSIS — R630 Anorexia: Secondary | ICD-10-CM | POA: Diagnosis not present

## 2021-07-10 DIAGNOSIS — D849 Immunodeficiency, unspecified: Secondary | ICD-10-CM | POA: Diagnosis not present

## 2021-07-10 DIAGNOSIS — B259 Cytomegaloviral disease, unspecified: Secondary | ICD-10-CM | POA: Diagnosis not present

## 2021-07-10 DIAGNOSIS — I10 Essential (primary) hypertension: Secondary | ICD-10-CM | POA: Diagnosis not present

## 2021-07-10 DIAGNOSIS — I951 Orthostatic hypotension: Secondary | ICD-10-CM | POA: Diagnosis not present

## 2021-07-10 DIAGNOSIS — N184 Chronic kidney disease, stage 4 (severe): Secondary | ICD-10-CM | POA: Diagnosis not present

## 2021-07-10 DIAGNOSIS — R634 Abnormal weight loss: Secondary | ICD-10-CM | POA: Diagnosis not present

## 2021-07-10 DIAGNOSIS — E1122 Type 2 diabetes mellitus with diabetic chronic kidney disease: Secondary | ICD-10-CM | POA: Diagnosis not present

## 2021-07-10 DIAGNOSIS — B258 Other cytomegaloviral diseases: Secondary | ICD-10-CM | POA: Diagnosis not present

## 2021-07-17 DIAGNOSIS — B259 Cytomegaloviral disease, unspecified: Secondary | ICD-10-CM | POA: Diagnosis not present

## 2021-07-17 DIAGNOSIS — Z940 Kidney transplant status: Secondary | ICD-10-CM | POA: Diagnosis not present

## 2021-07-17 DIAGNOSIS — R112 Nausea with vomiting, unspecified: Secondary | ICD-10-CM | POA: Diagnosis not present

## 2021-07-17 DIAGNOSIS — D849 Immunodeficiency, unspecified: Secondary | ICD-10-CM | POA: Diagnosis not present

## 2021-07-17 DIAGNOSIS — R627 Adult failure to thrive: Secondary | ICD-10-CM | POA: Diagnosis not present

## 2021-07-17 DIAGNOSIS — Z681 Body mass index (BMI) 19 or less, adult: Secondary | ICD-10-CM | POA: Diagnosis not present

## 2021-07-17 DIAGNOSIS — R634 Abnormal weight loss: Secondary | ICD-10-CM | POA: Diagnosis not present

## 2021-07-17 DIAGNOSIS — I951 Orthostatic hypotension: Secondary | ICD-10-CM | POA: Diagnosis not present

## 2021-07-17 DIAGNOSIS — N179 Acute kidney failure, unspecified: Secondary | ICD-10-CM | POA: Diagnosis not present

## 2021-07-17 DIAGNOSIS — E039 Hypothyroidism, unspecified: Secondary | ICD-10-CM | POA: Diagnosis not present

## 2021-07-17 DIAGNOSIS — Z4822 Encounter for aftercare following kidney transplant: Secondary | ICD-10-CM | POA: Diagnosis not present

## 2021-07-24 DIAGNOSIS — R634 Abnormal weight loss: Secondary | ICD-10-CM | POA: Diagnosis not present

## 2021-07-24 DIAGNOSIS — I10 Essential (primary) hypertension: Secondary | ICD-10-CM | POA: Diagnosis not present

## 2021-07-24 DIAGNOSIS — E861 Hypovolemia: Secondary | ICD-10-CM | POA: Diagnosis not present

## 2021-07-24 DIAGNOSIS — R627 Adult failure to thrive: Secondary | ICD-10-CM | POA: Diagnosis not present

## 2021-07-24 DIAGNOSIS — R7989 Other specified abnormal findings of blood chemistry: Secondary | ICD-10-CM | POA: Diagnosis not present

## 2021-07-24 DIAGNOSIS — Z79899 Other long term (current) drug therapy: Secondary | ICD-10-CM | POA: Diagnosis not present

## 2021-07-24 DIAGNOSIS — Z4822 Encounter for aftercare following kidney transplant: Secondary | ICD-10-CM | POA: Diagnosis not present

## 2021-07-24 DIAGNOSIS — I951 Orthostatic hypotension: Secondary | ICD-10-CM | POA: Diagnosis not present

## 2021-07-24 DIAGNOSIS — D849 Immunodeficiency, unspecified: Secondary | ICD-10-CM | POA: Diagnosis not present

## 2021-07-24 DIAGNOSIS — R630 Anorexia: Secondary | ICD-10-CM | POA: Diagnosis not present

## 2021-07-24 DIAGNOSIS — R112 Nausea with vomiting, unspecified: Secondary | ICD-10-CM | POA: Diagnosis not present

## 2021-07-24 DIAGNOSIS — B259 Cytomegaloviral disease, unspecified: Secondary | ICD-10-CM | POA: Diagnosis not present

## 2021-07-24 DIAGNOSIS — Z940 Kidney transplant status: Secondary | ICD-10-CM | POA: Diagnosis not present

## 2021-07-24 DIAGNOSIS — E039 Hypothyroidism, unspecified: Secondary | ICD-10-CM | POA: Diagnosis not present

## 2021-07-24 DIAGNOSIS — R509 Fever, unspecified: Secondary | ICD-10-CM | POA: Diagnosis not present

## 2021-07-31 DIAGNOSIS — B258 Other cytomegaloviral diseases: Secondary | ICD-10-CM | POA: Diagnosis not present

## 2021-07-31 DIAGNOSIS — L209 Atopic dermatitis, unspecified: Secondary | ICD-10-CM | POA: Diagnosis not present

## 2021-07-31 DIAGNOSIS — Z5181 Encounter for therapeutic drug level monitoring: Secondary | ICD-10-CM | POA: Diagnosis not present

## 2021-07-31 DIAGNOSIS — E861 Hypovolemia: Secondary | ICD-10-CM | POA: Diagnosis not present

## 2021-07-31 DIAGNOSIS — R112 Nausea with vomiting, unspecified: Secondary | ICD-10-CM | POA: Diagnosis not present

## 2021-07-31 DIAGNOSIS — I10 Essential (primary) hypertension: Secondary | ICD-10-CM | POA: Diagnosis not present

## 2021-07-31 DIAGNOSIS — I959 Hypotension, unspecified: Secondary | ICD-10-CM | POA: Diagnosis not present

## 2021-07-31 DIAGNOSIS — D849 Immunodeficiency, unspecified: Secondary | ICD-10-CM | POA: Diagnosis not present

## 2021-07-31 DIAGNOSIS — N179 Acute kidney failure, unspecified: Secondary | ICD-10-CM | POA: Diagnosis not present

## 2021-07-31 DIAGNOSIS — Z9884 Bariatric surgery status: Secondary | ICD-10-CM | POA: Diagnosis not present

## 2021-07-31 DIAGNOSIS — Z4822 Encounter for aftercare following kidney transplant: Secondary | ICD-10-CM | POA: Diagnosis not present

## 2021-07-31 DIAGNOSIS — E43 Unspecified severe protein-calorie malnutrition: Secondary | ICD-10-CM | POA: Diagnosis not present

## 2021-07-31 DIAGNOSIS — E876 Hypokalemia: Secondary | ICD-10-CM | POA: Diagnosis not present

## 2021-07-31 DIAGNOSIS — Z940 Kidney transplant status: Secondary | ICD-10-CM | POA: Diagnosis not present

## 2021-07-31 DIAGNOSIS — R7989 Other specified abnormal findings of blood chemistry: Secondary | ICD-10-CM | POA: Diagnosis not present

## 2021-07-31 DIAGNOSIS — R627 Adult failure to thrive: Secondary | ICD-10-CM | POA: Diagnosis not present

## 2021-07-31 DIAGNOSIS — I951 Orthostatic hypotension: Secondary | ICD-10-CM | POA: Diagnosis not present

## 2021-08-07 DIAGNOSIS — D849 Immunodeficiency, unspecified: Secondary | ICD-10-CM | POA: Diagnosis not present

## 2021-08-07 DIAGNOSIS — N184 Chronic kidney disease, stage 4 (severe): Secondary | ICD-10-CM | POA: Diagnosis not present

## 2021-08-07 DIAGNOSIS — Z5181 Encounter for therapeutic drug level monitoring: Secondary | ICD-10-CM | POA: Diagnosis not present

## 2021-08-07 DIAGNOSIS — E785 Hyperlipidemia, unspecified: Secondary | ICD-10-CM | POA: Diagnosis not present

## 2021-08-07 DIAGNOSIS — E876 Hypokalemia: Secondary | ICD-10-CM | POA: Diagnosis not present

## 2021-08-07 DIAGNOSIS — Z4822 Encounter for aftercare following kidney transplant: Secondary | ICD-10-CM | POA: Diagnosis not present

## 2021-08-07 DIAGNOSIS — E890 Postprocedural hypothyroidism: Secondary | ICD-10-CM | POA: Diagnosis not present

## 2021-08-07 DIAGNOSIS — K0889 Other specified disorders of teeth and supporting structures: Secondary | ICD-10-CM | POA: Diagnosis not present

## 2021-08-07 DIAGNOSIS — L209 Atopic dermatitis, unspecified: Secondary | ICD-10-CM | POA: Diagnosis not present

## 2021-08-07 DIAGNOSIS — E1122 Type 2 diabetes mellitus with diabetic chronic kidney disease: Secondary | ICD-10-CM | POA: Diagnosis not present

## 2021-08-07 DIAGNOSIS — I951 Orthostatic hypotension: Secondary | ICD-10-CM | POA: Diagnosis not present

## 2021-08-07 DIAGNOSIS — Z940 Kidney transplant status: Secondary | ICD-10-CM | POA: Diagnosis not present

## 2021-08-07 DIAGNOSIS — E039 Hypothyroidism, unspecified: Secondary | ICD-10-CM | POA: Diagnosis not present

## 2021-08-14 DIAGNOSIS — Z79890 Hormone replacement therapy (postmenopausal): Secondary | ICD-10-CM | POA: Diagnosis not present

## 2021-08-14 DIAGNOSIS — D649 Anemia, unspecified: Secondary | ICD-10-CM | POA: Diagnosis not present

## 2021-08-14 DIAGNOSIS — Z5181 Encounter for therapeutic drug level monitoring: Secondary | ICD-10-CM | POA: Diagnosis not present

## 2021-08-14 DIAGNOSIS — I10 Essential (primary) hypertension: Secondary | ICD-10-CM | POA: Diagnosis not present

## 2021-08-14 DIAGNOSIS — D849 Immunodeficiency, unspecified: Secondary | ICD-10-CM | POA: Diagnosis not present

## 2021-08-14 DIAGNOSIS — R627 Adult failure to thrive: Secondary | ICD-10-CM | POA: Diagnosis not present

## 2021-08-14 DIAGNOSIS — Z940 Kidney transplant status: Secondary | ICD-10-CM | POA: Diagnosis not present

## 2021-08-14 DIAGNOSIS — D72819 Decreased white blood cell count, unspecified: Secondary | ICD-10-CM | POA: Diagnosis not present

## 2021-08-14 DIAGNOSIS — R946 Abnormal results of thyroid function studies: Secondary | ICD-10-CM | POA: Diagnosis not present

## 2021-08-14 DIAGNOSIS — Z4822 Encounter for aftercare following kidney transplant: Secondary | ICD-10-CM | POA: Diagnosis not present

## 2021-08-14 DIAGNOSIS — Z7952 Long term (current) use of systemic steroids: Secondary | ICD-10-CM | POA: Diagnosis not present

## 2021-08-14 DIAGNOSIS — Z79621 Long term (current) use of calcineurin inhibitor: Secondary | ICD-10-CM | POA: Diagnosis not present

## 2021-08-29 DIAGNOSIS — B259 Cytomegaloviral disease, unspecified: Secondary | ICD-10-CM | POA: Diagnosis not present

## 2021-08-29 DIAGNOSIS — E785 Hyperlipidemia, unspecified: Secondary | ICD-10-CM | POA: Diagnosis not present

## 2021-08-29 DIAGNOSIS — R634 Abnormal weight loss: Secondary | ICD-10-CM | POA: Diagnosis not present

## 2021-08-29 DIAGNOSIS — R627 Adult failure to thrive: Secondary | ICD-10-CM | POA: Diagnosis not present

## 2021-08-29 DIAGNOSIS — I10 Essential (primary) hypertension: Secondary | ICD-10-CM | POA: Diagnosis not present

## 2021-08-29 DIAGNOSIS — D849 Immunodeficiency, unspecified: Secondary | ICD-10-CM | POA: Diagnosis not present

## 2021-08-29 DIAGNOSIS — D649 Anemia, unspecified: Secondary | ICD-10-CM | POA: Diagnosis not present

## 2021-08-29 DIAGNOSIS — Z940 Kidney transplant status: Secondary | ICD-10-CM | POA: Diagnosis not present

## 2021-08-29 DIAGNOSIS — Z4822 Encounter for aftercare following kidney transplant: Secondary | ICD-10-CM | POA: Diagnosis not present

## 2021-08-29 DIAGNOSIS — E890 Postprocedural hypothyroidism: Secondary | ICD-10-CM | POA: Diagnosis not present

## 2021-08-29 DIAGNOSIS — D72819 Decreased white blood cell count, unspecified: Secondary | ICD-10-CM | POA: Diagnosis not present

## 2021-09-12 DIAGNOSIS — D849 Immunodeficiency, unspecified: Secondary | ICD-10-CM | POA: Diagnosis not present

## 2021-09-12 DIAGNOSIS — Z4822 Encounter for aftercare following kidney transplant: Secondary | ICD-10-CM | POA: Diagnosis not present

## 2021-09-12 DIAGNOSIS — B259 Cytomegaloviral disease, unspecified: Secondary | ICD-10-CM | POA: Diagnosis not present

## 2021-09-12 DIAGNOSIS — Z5181 Encounter for therapeutic drug level monitoring: Secondary | ICD-10-CM | POA: Diagnosis not present

## 2021-09-12 DIAGNOSIS — Z79899 Other long term (current) drug therapy: Secondary | ICD-10-CM | POA: Diagnosis not present

## 2021-09-12 DIAGNOSIS — D649 Anemia, unspecified: Secondary | ICD-10-CM | POA: Diagnosis not present

## 2021-09-12 DIAGNOSIS — N184 Chronic kidney disease, stage 4 (severe): Secondary | ICD-10-CM | POA: Diagnosis not present

## 2021-09-12 DIAGNOSIS — Z940 Kidney transplant status: Secondary | ICD-10-CM | POA: Diagnosis not present

## 2021-09-12 DIAGNOSIS — E1122 Type 2 diabetes mellitus with diabetic chronic kidney disease: Secondary | ICD-10-CM | POA: Diagnosis not present

## 2021-09-12 DIAGNOSIS — Z9884 Bariatric surgery status: Secondary | ICD-10-CM | POA: Diagnosis not present

## 2021-09-12 DIAGNOSIS — Z79621 Long term (current) use of calcineurin inhibitor: Secondary | ICD-10-CM | POA: Diagnosis not present

## 2021-09-25 DIAGNOSIS — D72819 Decreased white blood cell count, unspecified: Secondary | ICD-10-CM | POA: Diagnosis not present

## 2021-09-25 DIAGNOSIS — L309 Dermatitis, unspecified: Secondary | ICD-10-CM | POA: Diagnosis not present

## 2021-09-25 DIAGNOSIS — Z79899 Other long term (current) drug therapy: Secondary | ICD-10-CM | POA: Diagnosis not present

## 2021-09-25 DIAGNOSIS — E039 Hypothyroidism, unspecified: Secondary | ICD-10-CM | POA: Diagnosis not present

## 2021-09-25 DIAGNOSIS — B259 Cytomegaloviral disease, unspecified: Secondary | ICD-10-CM | POA: Diagnosis not present

## 2021-09-25 DIAGNOSIS — Z79890 Hormone replacement therapy (postmenopausal): Secondary | ICD-10-CM | POA: Diagnosis not present

## 2021-09-25 DIAGNOSIS — D849 Immunodeficiency, unspecified: Secondary | ICD-10-CM | POA: Diagnosis not present

## 2021-09-25 DIAGNOSIS — Z7952 Long term (current) use of systemic steroids: Secondary | ICD-10-CM | POA: Diagnosis not present

## 2021-09-25 DIAGNOSIS — Z4822 Encounter for aftercare following kidney transplant: Secondary | ICD-10-CM | POA: Diagnosis not present

## 2021-09-25 DIAGNOSIS — I10 Essential (primary) hypertension: Secondary | ICD-10-CM | POA: Diagnosis not present

## 2021-09-25 DIAGNOSIS — Z940 Kidney transplant status: Secondary | ICD-10-CM | POA: Diagnosis not present

## 2021-09-25 DIAGNOSIS — R112 Nausea with vomiting, unspecified: Secondary | ICD-10-CM | POA: Diagnosis not present

## 2021-09-25 DIAGNOSIS — E785 Hyperlipidemia, unspecified: Secondary | ICD-10-CM | POA: Diagnosis not present

## 2021-10-09 DIAGNOSIS — D72819 Decreased white blood cell count, unspecified: Secondary | ICD-10-CM | POA: Diagnosis not present

## 2021-10-09 DIAGNOSIS — Z4822 Encounter for aftercare following kidney transplant: Secondary | ICD-10-CM | POA: Diagnosis not present

## 2021-10-09 DIAGNOSIS — Z8639 Personal history of other endocrine, nutritional and metabolic disease: Secondary | ICD-10-CM | POA: Diagnosis not present

## 2021-10-09 DIAGNOSIS — E039 Hypothyroidism, unspecified: Secondary | ICD-10-CM | POA: Diagnosis not present

## 2021-10-09 DIAGNOSIS — D649 Anemia, unspecified: Secondary | ICD-10-CM | POA: Diagnosis not present

## 2021-10-09 DIAGNOSIS — E785 Hyperlipidemia, unspecified: Secondary | ICD-10-CM | POA: Diagnosis not present

## 2021-10-09 DIAGNOSIS — B259 Cytomegaloviral disease, unspecified: Secondary | ICD-10-CM | POA: Diagnosis not present

## 2021-10-09 DIAGNOSIS — Z9884 Bariatric surgery status: Secondary | ICD-10-CM | POA: Diagnosis not present

## 2021-10-09 DIAGNOSIS — Z79621 Long term (current) use of calcineurin inhibitor: Secondary | ICD-10-CM | POA: Diagnosis not present

## 2021-10-09 DIAGNOSIS — Z5181 Encounter for therapeutic drug level monitoring: Secondary | ICD-10-CM | POA: Diagnosis not present

## 2021-10-09 DIAGNOSIS — I951 Orthostatic hypotension: Secondary | ICD-10-CM | POA: Diagnosis not present

## 2021-10-09 DIAGNOSIS — B258 Other cytomegaloviral diseases: Secondary | ICD-10-CM | POA: Diagnosis not present

## 2021-10-09 DIAGNOSIS — L209 Atopic dermatitis, unspecified: Secondary | ICD-10-CM | POA: Diagnosis not present

## 2021-10-09 DIAGNOSIS — D849 Immunodeficiency, unspecified: Secondary | ICD-10-CM | POA: Diagnosis not present

## 2021-10-09 DIAGNOSIS — Z940 Kidney transplant status: Secondary | ICD-10-CM | POA: Diagnosis not present

## 2021-10-09 DIAGNOSIS — I10 Essential (primary) hypertension: Secondary | ICD-10-CM | POA: Diagnosis not present

## 2021-10-19 ENCOUNTER — Ambulatory Visit (INDEPENDENT_AMBULATORY_CARE_PROVIDER_SITE_OTHER): Payer: Medicare PPO | Admitting: Psychiatry

## 2021-10-19 ENCOUNTER — Encounter (HOSPITAL_COMMUNITY): Payer: Self-pay | Admitting: Psychiatry

## 2021-10-19 ENCOUNTER — Ambulatory Visit (HOSPITAL_COMMUNITY): Payer: Medicare PPO | Admitting: Psychiatry

## 2021-10-19 VITALS — BP 138/70 | HR 64 | Ht 71.0 in | Wt 146.8 lb

## 2021-10-19 DIAGNOSIS — F411 Generalized anxiety disorder: Secondary | ICD-10-CM

## 2021-10-19 DIAGNOSIS — H6123 Impacted cerumen, bilateral: Secondary | ICD-10-CM | POA: Diagnosis not present

## 2021-10-19 DIAGNOSIS — F322 Major depressive disorder, single episode, severe without psychotic features: Secondary | ICD-10-CM | POA: Diagnosis not present

## 2021-10-19 DIAGNOSIS — J309 Allergic rhinitis, unspecified: Secondary | ICD-10-CM | POA: Diagnosis not present

## 2021-10-19 MED ORDER — BUPROPION HCL ER (XL) 300 MG PO TB24
300.0000 mg | ORAL_TABLET | ORAL | 2 refills | Status: DC
Start: 2021-10-19 — End: 2021-11-20

## 2021-10-19 MED ORDER — ESCITALOPRAM OXALATE 20 MG PO TABS: 20.0000 mg | ORAL_TABLET | Freq: Every day | ORAL | 2 refills | Status: DC

## 2021-10-19 MED ORDER — TRAZODONE HCL 150 MG PO TABS
150.0000 mg | ORAL_TABLET | Freq: Every day | ORAL | 2 refills | Status: DC
Start: 2021-10-19 — End: 2021-11-20

## 2021-10-19 MED ORDER — ALPRAZOLAM 1 MG PO TABS: 1.0000 mg | ORAL_TABLET | Freq: Two times a day (BID) | ORAL | 2 refills | Status: DC | PRN

## 2021-10-19 NOTE — Progress Notes (Signed)
BH MD/PA/NP OP Progress Note  10/19/2021 12:11 PM April Ward  MRN:  [id number]  Chief Complaint:  Chief Complaint  Patient presents with   Depression   Anxiety   Follow-up   HPI: This patient is a 61 year old widowed black female who lives with her son in [place].  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  The patient returns for follow-up after 5 months.  As noted last time she had a kidney transplant at [facility] on October 27.  Initially she was having a lot of complications from this but slowly she is doing better.  She still has had trouble gaining weight and is at 146 pounds.  Her appetite is not that great.  However she has been able to get off of all of her blood pressure medications.  She reports significant side effects from prednisone and Prograf.  She states that she has mental confusion has trouble concentrating and has pretty bad tremor in both hands.  I urged her to talk to the transplant team about the side effects.  She also has been a lot more depressed lately.  The dog that she had for 15 years died in 08/25/22.  He really helped her get through her husband's death several years ago.  She has been lonely and sad since.  She is thinking about getting another dog.  She is on Wellbutrin but I suggested adding another antidepressant along with that and she is in agreement.  Most of the time she sleeps okay but her energy and interest in doing things has diminished.  She denies thoughts of self-harm or suicidal ideation Visit Diagnosis:    ICD-10-CM   1. Major depressive disorder, single episode, severe without psychotic features ([place])  F32.2     2. GAD (generalized anxiety disorder)  F41.1       Past Psychiatric History: Long-term outpatient treatment  Past Medical History:  Past Medical History:  Diagnosis Date   Anxiety    Arthritis    Depression    Diabetes mellitus without complication (H[facility])    GERD (gastroesophageal reflux  disease)    Gout    Heart murmur    Hypertension    Renal cancer [facility])    Renal disease 10/2012   Renal insufficiency     Past Surgical History:  Procedure Laterality Date   CESAREAN SECTION  [id number]   CHONDROPLASTY Right 08/22/2012   Procedure: CHONDROPLASTY;  Surgeon: Carole Civil, MD;  Location: [facility];  Service: Orthopedics;  Laterality: Right;   COLONOSCOPY WITH PROPOFOL N/A 03/16/2014   Procedure: COLONOSCOPY WITH PROPOFOL (at cecum 1023, total withdrawal time=9 minutes);  Surgeon: Danie Binder, MD;  Location: [facility];  Service: Endoscopy;  Laterality: N/A;   GASTRIC BYPASS     KNEE ARTHROSCOPY WITH MEDIAL MENISECTOMY Right 08/22/2012   Procedure: KNEE ARTHROSCOPY WITH PARTIAL MEDIAL MENISECTOMY;  Surgeon: Carole Civil, MD;  Location: [facility];  Service: Orthopedics;  Laterality: Right;   PARTIAL NEPHRECTOMY  Dec 2014   left   TENDON REPAIR      Family Psychiatric History: See below  Family History:  Family History  Problem Relation Age of Onset   Heart attack Mother    Heart attack Father    Depression Paternal Aunt    Alcohol abuse Maternal Uncle    Colon cancer Maternal Aunt    Colon cancer Maternal Uncle     Social History:  Social History  Socioeconomic History   Marital status: Widowed    Spouse name: Not on file   Number of children: Not on file   Years of education: Not on file   Highest education level: Not on file  Occupational History   Occupation: [employer]    Employer: [employer]  Tobacco Use   Smoking status: Never   Smokeless tobacco: Never  Substance and Sexual Activity   Alcohol use: No    Alcohol/week: 0.0 standard drinks of alcohol   Drug use: No   Sexual activity: Not on file  Other Topics Concern   Not on file  Social History Narrative   Not on file   Social Determinants of Health   Financial Resource Strain: Not on file  Food Insecurity: Not on file  Transportation Needs: Not on file  Physical  Activity: Not on file  Stress: Not on file  Social Connections: Not on file    Allergies:  Allergies  Allergen Reactions   Skelaxin [Metaxalone] Hives and Rash    Metabolic Disorder Labs: No results found for: "HGBA1C", "MPG" No results found for: "PROLACTIN" Lab Results  Component Value Date   CHOL 215 (H) 03/11/2014   TRIG 288 (H) 03/11/2014   HDL 33 (L) 03/11/2014   CHOLHDL 6.5 03/11/2014   VLDL 58 (H) 03/11/2014   LDLCALC 124 (H) 03/11/2014   No results found for: "TSH"  Therapeutic Level Labs: No results found for: "LITHIUM" No results found for: "VALPROATE" No results found for: "CBMZ"  Current Medications: Current Outpatient Medications  Medication Sig Dispense Refill   escitalopram (LEXAPRO) 20 MG tablet Take 1 tablet (20 mg total) by mouth daily. 30 tablet 2   MAGNESIUM PO Take by mouth.     POTASSIUM PO Take by mouth.     PREDNISONE PO Take by mouth.     Sulfamethoxazole-Trimethoprim (BACTRIM PO) Take by mouth.     tacrolimus (PROGRAF) 1 MG capsule Take by mouth.     ALPRAZolam (XANAX) 1 MG tablet Take 1 tablet (1 mg total) by mouth 2 (two) times daily as needed for anxiety. 60 tablet 2   buPROPion (WELLBUTRIN XL) 300 MG 24 hr tablet Take 1 tablet (300 mg total) by mouth every morning. 90 tablet 2   levothyroxine (SYNTHROID, LEVOTHROID) 200 MCG tablet Take 1 tablet by mouth daily. X 6 days of the week & 369mg on one day of the week     traZODone (DESYREL) 150 MG tablet Take 1 tablet (150 mg total) by mouth at bedtime. 30 tablet 2   No current facility-administered medications for this visit.     Musculoskeletal: Strength & Muscle Tone: within normal limits Gait & Station: normal Patient leans: N/A  Psychiatric Specialty Exam: Review of Systems  Constitutional:  Positive for fatigue.  Neurological:  Positive for tremors.  Psychiatric/Behavioral:  Positive for decreased concentration and dysphoric mood.   All other systems reviewed and are  negative.   Blood pressure 138/70, pulse 64, height '5\' 11"'$  (1.803 m), weight 146 lb 12.8 oz (66.6 kg), SpO2 99 %.Body mass index is 20.47 kg/m.  General Appearance: Casual, Neat, and Well Groomed  Eye Contact:  Good  Speech:  Clear and Coherent  Volume:  Normal  Mood:  Depressed  Affect:  Flat  Thought Process:  Goal Directed  Orientation:  Full (Time, Place, and Person)  Thought Content: Rumination   Suicidal Thoughts:  No  Homicidal Thoughts:  No  Memory:  Immediate;   Good Recent;  Good Remote;   Good  Judgement:  Good  Insight:  Fair  Psychomotor Activity:  Decreased  Concentration:  Concentration: Fair and Attention Span: Fair  Recall:  [place] of Knowledge: Good  Language: Good  Akathisia:  No  Handed:  Right  AIMS (if indicated): not done  Assets:  Communication Skills Desire for Improvement Resilience Social Support Talents/Skills  ADL's:  Intact  Cognition: WNL  Sleep:  Good   Screenings: PHQ2-9    [place] Office Visit from 10/19/2021 in [facility] Video Visit from 06/19/2021 in [facility] [facility] Video Visit from 03/20/2021 in [place] [facility] Video Visit from 12/21/2020 in [place] [facility] Video Visit from 08/22/2020 in [facility]  PHQ-2 Total Score 3 2 0 0 0  PHQ-9 Total Score 13 11 -- -- --      Flowsheet Row Video Visit from 06/19/2021 in [place] Video Visit from 03/20/2021 in [facility] [facility] Video Visit from 12/21/2020 in [place] No Risk No Risk No Risk        Assessment and Plan: This patient is a 61 year old female with a history of end-stage renal disease status post kidney transplant  depression and anxiety.  The loss of her dog has been very difficult for her.  She we will add Lexapro 20 mg daily to the Wellbutrin XL 300 mg daily for depression.  She can continue Xanax 1 mg twice daily as needed for anxiety and trazodone 150 mg at bedtime for sleep.  She will return to see me in 4 weeks  Collaboration of Care: Collaboration of Care: Other provider involved in patient's care AEB notes are available to the transplant team through the epic system  Patient/Guardian was advised Release of Information must be obtained prior to any record release in order to collaborate their care with an outside provider. Patient/Guardian was advised if they have not already done so to contact the registration department to sign all necessary forms in order for [place] to release information regarding their care.   Consent: Patient/Guardian gives verbal consent for treatment and assignment of benefits for services provided during this visit. Patient/Guardian expressed understanding and agreed to proceed.    [name], MD 10/19/2021, 12:11 PM

## 2021-10-24 DIAGNOSIS — Z961 Presence of intraocular lens: Secondary | ICD-10-CM | POA: Diagnosis not present

## 2021-10-24 DIAGNOSIS — H524 Presbyopia: Secondary | ICD-10-CM | POA: Diagnosis not present

## 2021-10-24 DIAGNOSIS — H52203 Unspecified astigmatism, bilateral: Secondary | ICD-10-CM | POA: Diagnosis not present

## 2021-10-26 ENCOUNTER — Ambulatory Visit: Payer: Self-pay

## 2021-10-26 NOTE — Patient Instructions (Signed)
Visit Information  Thank you for taking time to visit with me today. Please don't hesitate to contact me if I can be of assistance to you.   Following are the goals we discussed today:   Goals Addressed               This Visit's Progress     Patient Stated     Complete an Advance Directive (pt-stated)        Care Coordination Interventions: Discussed patient does not have an Advance Directive but is interested in completing one Educated the patient on what an Advance Directive is and how to complete Mailed the patient an Emergency planning/management officer Scheduled follow up call over the next month to assist as needed      COMPLETED: Obtain a Diabetic foot exam (pt-stated)        Care Coordination Interventions: Performed chart review to note patient has not had a recent diabetic foot exam Educated the patient on the importance of a yearly foot exam Advised the patient to speak with her primary care provider regarding a referral to obtain a foot exam        Our next appointment is by telephone on 8/22 at 11:45 am  Please call the care guide team at [phone] if you need to cancel or reschedule your appointment.   If you are experiencing a Mental Health or [place] or need someone to talk to, please call [phone]-TALK (toll free, 24 hour hotline)  Patient verbalizes understanding of instructions and care plan provided today and agrees to view in [place]. Active MyChart status and patient understanding of how to access instructions and care plan via MyChart confirmed with patient.     Telephone follow up appointment with care management team member scheduled for:8/22  Daneen Schick, BSW, CDP Social Worker, Certified Dementia Practitioner Care Coordination [phone]

## 2021-10-26 NOTE — Patient Outreach (Signed)
  Care Coordination   Initial Visit Note   10/26/2021 Name: April Ward MRN: [id number] DOB: 1960/12/04  April Ward is a 61 y.o. year old female who sees Iona Beard, MD for primary care. I spoke with  April Ward by phone today  What matters to the patients health and wellness today?  Completing an Advance Directive   Goals Addressed               This Visit's Progress     Patient Stated     Complete an Advance Directive (pt-stated)        Care Coordination Interventions: Discussed patient does not have an Advance Directive but is interested in completing one Educated the patient on what an Advance Directive is and how to complete Mailed the patient an Emergency planning/management officer Scheduled follow up call over the next month to assist as needed      COMPLETED: Obtain a Diabetic foot exam (pt-stated)        Care Coordination Interventions: Performed chart review to note patient has not had a recent diabetic foot exam Educated the patient on the importance of a yearly foot exam Advised the patient to speak with her primary care provider regarding a referral to obtain a foot exam        SDOH assessments and interventions completed:   Yes SDOH Interventions Today    Flowsheet Row Most Recent Value  SDOH Interventions   Food Insecurity Interventions Intervention Not Indicated  Housing Interventions Intervention Not Indicated  Transportation Interventions Intervention Not Indicated       Care Coordination Interventions Activated:  Yes Care Coordination Interventions:  Yes, provided  Follow up plan: Follow up call scheduled for 8/22  Encounter Outcome:  Pt. Visit Completed  Daneen Schick, BSW, CDP Social Worker, Certified Dementia Practitioner Care Coordination [phone]

## 2021-10-30 DIAGNOSIS — B259 Cytomegaloviral disease, unspecified: Secondary | ICD-10-CM | POA: Diagnosis not present

## 2021-10-30 DIAGNOSIS — D849 Immunodeficiency, unspecified: Secondary | ICD-10-CM | POA: Diagnosis not present

## 2021-10-30 DIAGNOSIS — R82998 Other abnormal findings in urine: Secondary | ICD-10-CM | POA: Diagnosis not present

## 2021-10-30 DIAGNOSIS — Z940 Kidney transplant status: Secondary | ICD-10-CM | POA: Diagnosis not present

## 2021-11-06 DIAGNOSIS — D849 Immunodeficiency, unspecified: Secondary | ICD-10-CM | POA: Diagnosis not present

## 2021-11-06 DIAGNOSIS — N2889 Other specified disorders of kidney and ureter: Secondary | ICD-10-CM | POA: Diagnosis not present

## 2021-11-06 DIAGNOSIS — C649 Malignant neoplasm of unspecified kidney, except renal pelvis: Secondary | ICD-10-CM | POA: Diagnosis not present

## 2021-11-06 DIAGNOSIS — N182 Chronic kidney disease, stage 2 (mild): Secondary | ICD-10-CM | POA: Diagnosis not present

## 2021-11-06 DIAGNOSIS — I959 Hypotension, unspecified: Secondary | ICD-10-CM | POA: Diagnosis not present

## 2021-11-06 DIAGNOSIS — Z79621 Long term (current) use of calcineurin inhibitor: Secondary | ICD-10-CM | POA: Diagnosis not present

## 2021-11-06 DIAGNOSIS — Z792 Long term (current) use of antibiotics: Secondary | ICD-10-CM | POA: Diagnosis not present

## 2021-11-06 DIAGNOSIS — R627 Adult failure to thrive: Secondary | ICD-10-CM | POA: Diagnosis not present

## 2021-11-06 DIAGNOSIS — E1122 Type 2 diabetes mellitus with diabetic chronic kidney disease: Secondary | ICD-10-CM | POA: Diagnosis not present

## 2021-11-06 DIAGNOSIS — R634 Abnormal weight loss: Secondary | ICD-10-CM | POA: Diagnosis not present

## 2021-11-06 DIAGNOSIS — Z7952 Long term (current) use of systemic steroids: Secondary | ICD-10-CM | POA: Diagnosis not present

## 2021-11-06 DIAGNOSIS — Z940 Kidney transplant status: Secondary | ICD-10-CM | POA: Diagnosis not present

## 2021-11-06 DIAGNOSIS — Z4822 Encounter for aftercare following kidney transplant: Secondary | ICD-10-CM | POA: Diagnosis not present

## 2021-11-06 DIAGNOSIS — B259 Cytomegaloviral disease, unspecified: Secondary | ICD-10-CM | POA: Diagnosis not present

## 2021-11-06 DIAGNOSIS — I151 Hypertension secondary to other renal disorders: Secondary | ICD-10-CM | POA: Diagnosis not present

## 2021-11-06 DIAGNOSIS — Z79890 Hormone replacement therapy (postmenopausal): Secondary | ICD-10-CM | POA: Diagnosis not present

## 2021-11-13 DIAGNOSIS — D849 Immunodeficiency, unspecified: Secondary | ICD-10-CM | POA: Diagnosis not present

## 2021-11-13 DIAGNOSIS — Z940 Kidney transplant status: Secondary | ICD-10-CM | POA: Diagnosis not present

## 2021-11-16 ENCOUNTER — Ambulatory Visit (HOSPITAL_COMMUNITY): Payer: Medicare PPO | Admitting: Psychiatry

## 2021-11-20 ENCOUNTER — Ambulatory Visit (INDEPENDENT_AMBULATORY_CARE_PROVIDER_SITE_OTHER): Payer: Medicare PPO | Admitting: Psychiatry

## 2021-11-20 ENCOUNTER — Encounter (HOSPITAL_COMMUNITY): Payer: Self-pay | Admitting: Psychiatry

## 2021-11-20 VITALS — BP 165/81 | HR 57 | Ht 71.0 in | Wt 152.6 lb

## 2021-11-20 DIAGNOSIS — Z940 Kidney transplant status: Secondary | ICD-10-CM | POA: Diagnosis not present

## 2021-11-20 DIAGNOSIS — E890 Postprocedural hypothyroidism: Secondary | ICD-10-CM | POA: Diagnosis not present

## 2021-11-20 DIAGNOSIS — Z79890 Hormone replacement therapy (postmenopausal): Secondary | ICD-10-CM | POA: Diagnosis not present

## 2021-11-20 DIAGNOSIS — F322 Major depressive disorder, single episode, severe without psychotic features: Secondary | ICD-10-CM | POA: Diagnosis not present

## 2021-11-20 DIAGNOSIS — D849 Immunodeficiency, unspecified: Secondary | ICD-10-CM | POA: Diagnosis not present

## 2021-11-20 DIAGNOSIS — Z79899 Other long term (current) drug therapy: Secondary | ICD-10-CM | POA: Diagnosis not present

## 2021-11-20 DIAGNOSIS — Z79621 Long term (current) use of calcineurin inhibitor: Secondary | ICD-10-CM | POA: Diagnosis not present

## 2021-11-20 DIAGNOSIS — D72819 Decreased white blood cell count, unspecified: Secondary | ICD-10-CM | POA: Diagnosis not present

## 2021-11-20 DIAGNOSIS — Z792 Long term (current) use of antibiotics: Secondary | ICD-10-CM | POA: Diagnosis not present

## 2021-11-20 DIAGNOSIS — Z4822 Encounter for aftercare following kidney transplant: Secondary | ICD-10-CM | POA: Diagnosis not present

## 2021-11-20 DIAGNOSIS — Z7952 Long term (current) use of systemic steroids: Secondary | ICD-10-CM | POA: Diagnosis not present

## 2021-11-20 DIAGNOSIS — I10 Essential (primary) hypertension: Secondary | ICD-10-CM | POA: Diagnosis not present

## 2021-11-20 MED ORDER — TRAZODONE HCL 150 MG PO TABS: 150.0000 mg | ORAL_TABLET | Freq: Every day | ORAL | 2 refills | Status: DC

## 2021-11-20 MED ORDER — ALPRAZOLAM 1 MG PO TABS
1.0000 mg | ORAL_TABLET | Freq: Two times a day (BID) | ORAL | 2 refills | Status: DC | PRN
Start: 2021-11-20 — End: 2022-01-03

## 2021-11-20 MED ORDER — BUPROPION HCL ER (XL) 300 MG PO TB24: 300.0000 mg | ORAL_TABLET | ORAL | 2 refills | Status: DC

## 2021-11-20 MED ORDER — CYPROHEPTADINE HCL 4 MG PO TABS: 4.0000 mg | ORAL_TABLET | Freq: Every day | ORAL | 2 refills | Status: DC

## 2021-11-20 MED ORDER — ESCITALOPRAM OXALATE 20 MG PO TABS: 20.0000 mg | ORAL_TABLET | Freq: Every day | ORAL | 2 refills | Status: DC

## 2021-11-20 NOTE — Progress Notes (Signed)
[facility] MD/PA/NP OP Progress Note  11/20/2021 3:46 PM April Ward  MRN:  [id number]  Chief Complaint:  Chief Complaint  Patient presents with   Depression   Anxiety   Follow-up   HPI: This patient is a 61 year old widowed black female who lives with her son and asked in [place].  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  The patient returns for follow-up after 4 weeks.  She is still not doing much better.  She is still sad and down a lot of the time.  Most much of this started after her dog died in 08-21-22.  She feels lonely and sad.  Adding the Lexapro really has not helped that much.  She is not eating very well but has gained a little weight is up to 152 pounds.  I suggested we add something to help with appetite such as Periactin and she is in agreement.  I also think she would benefit from counseling.  She denies any thoughts of self-harm or suicidal ideatio Visit Diagnosis:    ICD-10-CM   1. Major depressive disorder, single episode, severe without psychotic features ([place])  F32.2       Past Psychiatric History: Long-term outpatient treatment  Past Medical History:  Past Medical History:  Diagnosis Date   Anxiety    Arthritis    Depression    Diabetes mellitus without complication ([place])    GERD (gastroesophageal reflux disease)    Gout    Heart murmur    Hypertension    Renal cancer ([place])    Renal disease 10/2012   Renal insufficiency     Past Surgical History:  Procedure Laterality Date   CESAREAN SECTION  [id number]   CHONDROPLASTY Right 08/22/2012   Procedure: CHONDROPLASTY;  Surgeon: Carole Civil, MD;  Location: [facility];  Service: Orthopedics;  Laterality: Right;   COLONOSCOPY WITH PROPOFOL N/A 03/16/2014   Procedure: COLONOSCOPY WITH PROPOFOL (at cecum 1023, total withdrawal time=9 minutes);  Surgeon: Danie Binder, MD;  Location: [facility];  Service: Endoscopy;  Laterality: N/A;   GASTRIC BYPASS     KNEE ARTHROSCOPY WITH MEDIAL MENISECTOMY Right  08/22/2012   Procedure: KNEE ARTHROSCOPY WITH PARTIAL MEDIAL MENISECTOMY;  Surgeon: Carole Civil, MD;  Location: [facility];  Service: Orthopedics;  Laterality: Right;   PARTIAL NEPHRECTOMY  Dec 2014   left   TENDON REPAIR      Family Psychiatric History: See below  Family History:  Family History  Problem Relation Age of Onset   Heart attack Mother    Heart attack Father    Depression Paternal Aunt    Alcohol abuse Maternal Uncle    Colon cancer Maternal Aunt    Colon cancer Maternal Uncle     Social History:  Social History   Socioeconomic History   Marital status: Widowed    Spouse name: Not on file   Number of children: Not on file   Years of education: Not on file   Highest education level: Not on file  Occupational History   Occupation: [employer]    Employer: [employer]  Tobacco Use   Smoking status: Never   Smokeless tobacco: Never  Substance and Sexual Activity   Alcohol use: No    Alcohol/week: 0.0 standard drinks of alcohol   Drug use: No   Sexual activity: Not on file  Other Topics Concern   Not on file  Social History Narrative   Not on file  Social Determinants of Health   Financial Resource Strain: Not on file  Food Insecurity: No Food Insecurity (10/26/2021)   Hunger Vital Sign    Worried About Running Out of Food in the Last Year: Never true    Ran Out of Food in the Last Year: Never true  Transportation Needs: No Transportation Needs (10/26/2021)   PRAPARE - Hydrologist (Medical): No    Lack of Transportation (Non-Medical): No  Physical Activity: Not on file  Stress: Not on file  Social Connections: Not on file    Allergies:  Allergies  Allergen Reactions   Skelaxin [Metaxalone] Hives and Rash    Metabolic Disorder Labs: No results found for: "HGBA1C", "MPG" No results found for: "PROLACTIN" Lab Results  Component Value Date   CHOL 215 (H) 03/11/2014   TRIG 288 (H) 03/11/2014    HDL 33 (L) 03/11/2014   CHOLHDL 6.5 03/11/2014   VLDL 58 (H) 03/11/2014   LDLCALC 124 (H) 03/11/2014   No results found for: "TSH"  Therapeutic Level Labs: No results found for: "LITHIUM" No results found for: "VALPROATE" No results found for: "CBMZ"  Current Medications: Current Outpatient Medications  Medication Sig Dispense Refill   cyproheptadine (PERIACTIN) 4 MG tablet Take 1 tablet (4 mg total) by mouth at bedtime. 30 tablet 2   ALPRAZolam (XANAX) 1 MG tablet Take 1 tablet (1 mg total) by mouth 2 (two) times daily as needed for anxiety. 60 tablet 2   buPROPion (WELLBUTRIN XL) 300 MG 24 hr tablet Take 1 tablet (300 mg total) by mouth every morning. 90 tablet 2   escitalopram (LEXAPRO) 20 MG tablet Take 1 tablet (20 mg total) by mouth daily. 30 tablet 2   levothyroxine (SYNTHROID, LEVOTHROID) 200 MCG tablet Take 1 tablet by mouth daily. X 6 days of the week & 32mg on one day of the week     MAGNESIUM PO Take by mouth.     POTASSIUM PO Take by mouth.     PREDNISONE PO Take by mouth.     Sulfamethoxazole-Trimethoprim (BACTRIM PO) Take by mouth.     tacrolimus (PROGRAF) 1 MG capsule Take by mouth.     traZODone (DESYREL) 150 MG tablet Take 1 tablet (150 mg total) by mouth at bedtime. 30 tablet 2   No current facility-administered medications for this visit.     Musculoskeletal: Strength & Muscle Tone: within normal limits Gait & Station: normal Patient leans: N/A  Psychiatric Specialty Exam: Review of Systems  Psychiatric/Behavioral:  Positive for dysphoric mood and sleep disturbance.     Blood pressure (!) 165/81, pulse (!) 57, height '5\' 11"'$  (1.803 m), weight 152 lb 9.6 oz (69.2 kg), SpO2 100 %.Body mass index is 21.28 kg/m.  General Appearance: Casual, Neat, and Well Groomed  Eye Contact:  Good  Speech:  Clear and Coherent  Volume:  Normal  Mood:  Dysphoric  Affect:  Depressed  Thought Process:  Goal Directed  Orientation:  Full (Time, Place, and Person)   Thought Content: Rumination   Suicidal Thoughts:  No  Homicidal Thoughts:  No  Memory:  Immediate;   Good Recent;   Good Remote;   Good  Judgement:  Good  Insight:  Fair  Psychomotor Activity:  Decreased  Concentration:  Concentration: Poor and Attention Span: Poor  Recall:  Good  Fund of Knowledge: good  Language: Good  Akathisia:  No  Handed:  Right  AIMS (if indicated): not done  Assets:  Communication  Skills Desire for Improvement Resilience  ADL's:  Intact  Cognition: WNL  Sleep:  Poor   Screenings: PHQ2-9    Flowsheet Row Office Visit from 11/20/2021 in [facility] Office Visit from 10/19/2021 in [facility] Video Visit from 06/19/2021 in [facility] Video Visit from 03/20/2021 in [facility] Video Visit from 12/21/2020 in [facility]  PHQ-2 Total Score '6 3 2 '$ 0 0  PHQ-9 Total Score '17 13 11 '$ -- --      [facility] Office Visit from 11/20/2021 in [facility] Video Visit from 06/19/2021 in [facility] Video Visit from 03/20/2021 in [facility] No Risk No Risk No Risk        Assessment and Plan: This patient is a 61 year old female with a history of end-stage renal disease status post kidney transplant depression anxiety.  She still grieving the loss of her dog and prior to that the loss of her husband.  Since she is not eating well we will add Periactin 4 mg at bedtime.  She really does not want to add a lot more medication.  She will continue Lexapro 20 mg daily as well as Wellbutrin XL 300 mg daily for depression, Xanax 1 mg twice daily only as needed for anxiety and trazodone 150 mg at bedtime for sleep.  She will  return to see me in 6 weeks  Collaboration of Care: Collaboration of Care: Referral or follow-up with counselor/therapist AEB patient will return to see Maurice Small in our office for therapy  Patient/Guardian was advised Release of Information must be obtained prior to any record release in order to collaborate their care with an outside provider. Patient/Guardian was advised if they have not already done so to contact the registration department to sign all necessary forms in order for [place] to release information regarding their care.   Consent: Patient/Guardian gives verbal consent for treatment and assignment of benefits for services provided during this visit. Patient/Guardian expressed understanding and agreed to proceed.    [name], MD 11/20/2021, 3:46 PM

## 2021-11-21 ENCOUNTER — Ambulatory Visit: Payer: Self-pay

## 2021-11-21 NOTE — Patient Outreach (Signed)
  Care Coordination   11/21/2021 Name: April Ward MRN: [id number] DOB: 1960-07-23   Care Coordination Outreach Attempts:  An unsuccessful telephone outreach was attempted today to offer the patient information about available care coordination services as a benefit of their health plan.   Follow Up Plan:  Additional outreach attempts will be made to offer the patient care coordination information and services.   Encounter Outcome:  No Answer  Care Coordination Interventions Activated:  No   Care Coordination Interventions:  No, not indicated    Daneen Schick, BSW, CDP Social Worker, Certified Dementia Practitioner Care Coordination [phone]

## 2021-11-22 ENCOUNTER — Encounter (HOSPITAL_COMMUNITY): Payer: Self-pay | Admitting: Psychiatry

## 2021-11-22 ENCOUNTER — Ambulatory Visit (INDEPENDENT_AMBULATORY_CARE_PROVIDER_SITE_OTHER): Payer: Medicare PPO | Admitting: Psychiatry

## 2021-11-22 DIAGNOSIS — F322 Major depressive disorder, single episode, severe without psychotic features: Secondary | ICD-10-CM | POA: Diagnosis not present

## 2021-11-22 NOTE — Progress Notes (Signed)
Comprehensive Clinical Assessment (CCA) Note  11/22/2021 April Ward [id number]  Chief Complaint:  Chief Complaint  Patient presents with   Depression   Visit Diagnosis: Major depressive disorder major depressive disorder, single episode, severe      CCA Biopsychosocial Intake/Chief Complaint:  "I'm tired of being depressed an dpeople have a tendancy to tell me I need to gain weight, look like a crackhead, so this has bothered self-esteem, I don't go around people that much anymore, I miss my dog, he died in 08-21-2021, financial stress (trying to pay bills and buy medication), self-image  Current Symptoms/Problems: depressed mood, frustrated, irritabiity, poor self-esteem, excessive worry   Patient Reported Schizophrenia/Schizoaffective Diagnosis in Past: No   Strengths: has hobbies - work in flowers, watch hummingbirds, going to church  Preferences: Individual therapy  Abilities: ccoking, canning   Type of Services Patient Feels are Needed: "to get control of myself- not be so easily agitated, realize there are some things I can't fix"   Initial Clinical Notes/Concerns: Pt is referred for services by psychiatrist Dr. Harrington Challenger due to pt experiencing symptoms of anxiety and depression. She denies any psychiatric hospitalizations. She is a retuning pt to this clinician and last was seen in 2021. She continues to see psychiatrist Dr. Harrington Challenger for mediction management.   Mental Health Symptoms Depression:   Change in energy/activity; Difficulty Concentrating; Fatigue; Hopelessness; Increase/decrease in appetite; Irritability; Sleep (too much or little); Tearfulness; Worthlessness   Duration of Depressive symptoms:  Greater than two weeks   Mania:   Racing thoughts; Irritability; Change in energy/activity   Anxiety:    Difficulty concentrating; Fatigue; Irritability; Sleep; Tension; Worrying   Psychosis:   None   Duration of Psychotic symptoms: No data recorded  Trauma:    None   Obsessions:   None   Compulsions:   None   Inattention:   None   Hyperactivity/Impulsivity:   Hard time playing/leisure activities quietly   Oppositional/Defiant Behaviors:  No data recorded  Emotional Irregularity:   None   Other Mood/Personality Symptoms:  No data recorded   Mental Status Exam Appearance and self-care  Stature:   Tall   Weight:   Thin   Clothing:   Casual   Grooming:   Normal   Cosmetic use:   None   Posture/gait:   Normal   Motor activity:   Not Remarkable   Sensorium  Attention:   Normal   Concentration:   Normal   Orientation:   X5   Recall/memory:   Normal   Affect and Mood  Affect:   Blunted; Depressed   Mood:   Depressed; Anxious   Relating  Eye contact:   Normal   Facial expression:   Sad   Attitude toward examiner:   Cooperative   Thought and Language  Speech flow:  Normal   Thought content:   Appropriate to Mood and Circumstances   Preoccupation:   Ruminations   Hallucinations:   None   Organization:  No data recorded  [employer] of Knowledge:   Good   Intelligence:   Average   Abstraction:   Normal   Judgement:   Good   Reality Testing:   Realistic   Insight:   Good   Decision Making:   Normal   Social Functioning  Social Maturity:   Isolates   Social Judgement:   Normal   Stress  Stressors:   Museum/gallery curator; Illness; Grief/losses   Coping Ability:   Programme researcher, broadcasting/film/video  Deficits:  No data recorded  Supports:   Church; Family     Religion: Religion/Spirituality Are You A Religious Person?: Yes What is Your Religious Affiliation?: Baptist  Leisure/Recreation: Leisure / Recreation Do You Have Hobbies?: Yes Leisure and Hobbies: cooking, working in her flower garden, Medical laboratory scientific officer, listening to birds, going on cruises. going to beach  Exercise/Diet: Exercise/Diet Do You Exercise?: Yes What Type of Exercise Do You Do?:  (works out at the gym  - cardio and strength training) How Many Times a Week Do You Exercise?: 1-3 times a week Have You Gained or Lost A Significant Amount of Weight in the Past Six Months?: Yes-Lost Number of Pounds Lost?: 40 (attributes this to thyroid issues that now have been addressed.) Do You Follow a Special Diet?: No Do You Have Any Trouble Sleeping?: Yes Explanation of Sleeping Difficulties: Difficulty falling and staying asleep   CCA Employment/Education Employment/Work Situation: Employment / Work Situation Employment Situation: On disability Why is Patient on Disability: kidney issues, mental issues How Long has Patient Been on Disability: since 07-04-2007 What is the Longest Time Patient has Held a Job?: 20 years Where was the Patient Employed at that Time?: [employer] Has Patient ever Been in the [employer]?: No  Education: Education Did Teacher, adult education From [employer]?: Yes Did Physicist, medical?: Yes What Type of College Degree Do you Have?: 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[employer], Occupational Therapy [place] Did You Have Any Special Interests In School?: chorus, track Did You Have An Individualized Education Program (IIEP): No Did You Have Any Difficulty At School?: No Patient's Education Has Been Impacted by Current Illness: No   CCA Family/Childhood History Family and Relationship History: Family history Marital status: Widowed (Pt has been married twice.) Widowed, when?: died in 07-03-16 Does patient have children?: Yes How many children?: 2 (89 yo son, 43 yo daughter) How is patient's relationship with their children?: daughter calls every day, son somes by every day  Childhood History:  Childhood History By whom was/is the patient raised?: Both parents Additional childhood history information: Pt was born and reared in [place] Description of patient's relationship with caregiver when they were a child: very good, we didn't have any issues Patient's description of current relationship with people who  raised him/her: deceased How were you disciplined when you got in trouble as a child/adolescent?: "mama would use a switch" Does patient have siblings?: Yes Number of Siblings: 2 Description of patient's current relationship with siblings: one is deceased, very close with remaining sibling, talk every day Did patient suffer any verbal/emotional/physical/sexual abuse as a child?: No Did patient suffer from severe childhood neglect?: No Has patient ever been sexually abused/assaulted/raped as an adolescent or adult?: No Was the patient ever a victim of a crime or a disaster?: No Witnessed domestic violence?: No Has patient been affected by domestic violence as an adult?: No  Child/Adolescent Assessment: N/A     CCA Substance Use  None   Alcohol/Drug Use: Alcohol / Drug Use Pain Medications: see patient record Prescriptions: see paient record Over the Counter: see patient record History of alcohol / drug use?: No history of alcohol / drug abuse   ASAM's:  Six Dimensions of Multidimensional Assessment  Dimension 1:  Acute Intoxication and/or Withdrawal Potential:   Dimension 1:  Description of individual's past and current experiences of substance use and withdrawal: none  Dimension 2:  Biomedical Conditions and Complications:   Dimension 2:  Description of patient's biomedical conditions and  complications: noone  Dimension  3:  Emotional, Behavioral, or Cognitive Conditions and Complications:  Dimension 3:  Description of emotional, behavioral, or cognitive conditions and complications: none  Dimension 4:  Readiness to Change:  Dimension 4:  Description of Readiness to Change criteria: none  Dimension 5:  Relapse, Continued use, or Continued Problem Potential:  Dimension 5:  Relapse, continued use, or continued problem potential critiera description: none  Dimension 6:  Recovery/Living Environment:  Dimension 6:  Recovery/Iiving environment criteria description: none  ASAM  Severity Score: ASAM's Severity Rating Score: 0  ASAM Recommended Level of Treatment:     Substance use Disorder (SUD) NONR  Recommendations for Services/Supports/Treatments: Recommendations for Services/Supports/Treatments Recommendations For Services/Supports/Treatments: Individual Therapy, Medication Management/patient attends assessment appointment today.  Nutritional assessment, pain assessment, PHQ 29, C-SS RS, GAD-7 administered.  Individual therapy is recommended 1 time every 1 to 4 weeks to alleviate symptoms of depression and improve coping skills to manage stress and anxiety.  Patient will continue sees cardiologist Dr. Harrington Challenger for medication management.  DSM5 Diagnoses: Patient Active Problem List   Diagnosis Date Noted   Postoperative hypothyroidism 08/08/2018   Thyroid cancer ([place]) 08/08/2018   Non-toxic multinodular goiter 10/18/2015   Acquired claw toe of left foot 08/04/2015   Hallux valgus of left foot 08/04/2015   Protein-calorie malnutrition ([place]) 01/10/2015   S/P bariatric surgery 01/10/2015   Adhesive capsulitis of left shoulder 09/29/2014   Posterior tibial tendon dysfunction 05/04/2014   Gastroesophageal reflux disease without esophagitis 04/26/2014   Morbid obesity due to excess calories ([place]) 04/26/2014   Encounter for screening colonoscopy 12/17/2013   Pain in joint, ankle and foot 12/14/2013   Anxiety 09/23/2013   Diabetes mellitus ([place]) 09/23/2013   HTN (hypertension) 09/23/2013   Chronic renal disease, stage 3, moderately decreased glomerular filtration rate between 30-59 mL/min/1.73 square meter ([place]) 09/23/2013   Gout 09/23/2013   FSGS (focal segmental glomerulosclerosis) 04/29/2013   Renal cell cancer ([place]) 04/29/2013   Major depression 04/17/2013   Proteinuria 02/13/2013   Stiffness of right knee 09/03/2012   Difficulty in walking(719.7) 09/03/2012   OA (osteoarthritis) of knee 07/23/2012   Medial meniscus, posterior horn derangement 07/23/2012     Patient Centered Plan: Patient is on the following Treatment Plan(s): Will be developed next session   Referrals to Alternative Service(s): Referred to Alternative Service(s):   Place:   Date:   Time:    Referred to Alternative Service(s):   Place:   Date:   Time:    Referred to Alternative Service(s):   Place:   Date:   Time:    Referred to Alternative Service(s):   Place:   Date:   Time:      Collaboration of Care: Psychiatrist AEB patient working with psychiatrist Dr. Harrington Challenger in this practice.  Patient/Guardian was advised Release of Information must be obtained prior to any record release in order to collaborate their care with an outside provider. Patient/Guardian was advised if they have not already done so to contact the registration department to sign all necessary forms in order for [place] to release information regarding their care.   Consent: Patient/Guardian gives verbal consent for treatment and assignment of benefits for services provided during this visit. Patient/Guardian expressed understanding and agreed to proceed.   Nevena Rozenberg E Anddy Wingert, LCSW

## 2021-11-27 ENCOUNTER — Ambulatory Visit: Payer: Self-pay

## 2021-11-27 NOTE — Patient Outreach (Signed)
  Care Coordination   11/27/2021 Name: April Ward MRN: [id number] DOB: 02-13-1961   Care Coordination Outreach Attempts:  A second unsuccessful outreach was attempted today to offer the patient with information about available care coordination services as a benefit of their health plan.     Follow Up Plan:  Additional outreach attempts will be made to offer the patient care coordination information and services.   Encounter Outcome:  No Answer  Care Coordination Interventions Activated:  No   Care Coordination Interventions:  No, not indicated    Daneen Schick, BSW, CDP Social Worker, Certified Dementia Practitioner Care Coordination [phone]

## 2021-11-30 ENCOUNTER — Ambulatory Visit: Payer: Self-pay

## 2021-11-30 NOTE — Patient Outreach (Signed)
  Care Coordination   Follow Up Visit Note   11/30/2021 Name: April Ward MRN: [id number] DOB: [birth year]  April Ward is a 61 y.o. year old female who sees April Beard, MD for primary care. I spoke with  April Ward by phone today.  What matters to the patients health and wellness today?  No concerns, doing well    Goals Addressed               This Visit's Progress     Patient Stated     COMPLETED: Complete an Advance Directive (pt-stated)        Care Coordination Interventions: Determined patient did receive advance directive in the mail Patient declines questions regarding completion at this time Advised patient that once completed to keep the original while providing a copy to whomever she names as her healthcare power of attorney as well as a copy to her healthcare provider        SDOH assessments and interventions completed:  No     Care Coordination Interventions Activated:  Yes  Care Coordination Interventions:  Yes, provided   Follow up plan: No further intervention required.   Encounter Outcome:  Pt. Visit Completed   Daneen Schick, BSW, CDP Social Worker, Certified Dementia Practitioner Care Coordination [phone]

## 2021-11-30 NOTE — Patient Instructions (Signed)
Visit Information  Thank you for taking time to visit with me today. Please don't hesitate to contact me if I can be of assistance to you.   Following are the goals we discussed today:   Goals Addressed               This Visit's Progress     Patient Stated     COMPLETED: Complete an Advance Directive (pt-stated)        Care Coordination Interventions: Determined patient did receive advance directive in the mail Patient declines questions regarding completion at this time Advised patient that once completed to keep the original while providing a copy to whomever she names as her healthcare power of attorney as well as a copy to her healthcare provider         If you are experiencing a Mental Health or [place] or need someone to talk to, please call 1-800-273-TALK (toll free, 24 hour hotline)  Patient verbalizes understanding of instructions and care plan provided today and agrees to view in [facility]. Active MyChart status and patient understanding of how to access instructions and care plan via MyChart confirmed with patient.     No further follow up required: Please contact your primary care provider as needed  Daneen Schick, BSW, CDP Social Worker, Certified Dementia Practitioner Care Coordination [phone]

## 2021-12-14 DIAGNOSIS — Z940 Kidney transplant status: Secondary | ICD-10-CM | POA: Diagnosis not present

## 2021-12-14 DIAGNOSIS — Z4822 Encounter for aftercare following kidney transplant: Secondary | ICD-10-CM | POA: Diagnosis not present

## 2021-12-18 ENCOUNTER — Encounter (HOSPITAL_COMMUNITY): Payer: Self-pay

## 2021-12-18 ENCOUNTER — Ambulatory Visit (INDEPENDENT_AMBULATORY_CARE_PROVIDER_SITE_OTHER): Payer: Medicare PPO | Admitting: Psychiatry

## 2021-12-18 DIAGNOSIS — R627 Adult failure to thrive: Secondary | ICD-10-CM | POA: Diagnosis not present

## 2021-12-18 DIAGNOSIS — D649 Anemia, unspecified: Secondary | ICD-10-CM | POA: Diagnosis not present

## 2021-12-18 DIAGNOSIS — Z79621 Long term (current) use of calcineurin inhibitor: Secondary | ICD-10-CM | POA: Diagnosis not present

## 2021-12-18 DIAGNOSIS — Z5181 Encounter for therapeutic drug level monitoring: Secondary | ICD-10-CM | POA: Diagnosis not present

## 2021-12-18 DIAGNOSIS — E876 Hypokalemia: Secondary | ICD-10-CM | POA: Diagnosis not present

## 2021-12-18 DIAGNOSIS — L209 Atopic dermatitis, unspecified: Secondary | ICD-10-CM | POA: Diagnosis not present

## 2021-12-18 DIAGNOSIS — Z940 Kidney transplant status: Secondary | ICD-10-CM | POA: Diagnosis not present

## 2021-12-18 DIAGNOSIS — I10 Essential (primary) hypertension: Secondary | ICD-10-CM | POA: Diagnosis not present

## 2021-12-18 DIAGNOSIS — R7989 Other specified abnormal findings of blood chemistry: Secondary | ICD-10-CM | POA: Diagnosis not present

## 2021-12-18 DIAGNOSIS — Z9884 Bariatric surgery status: Secondary | ICD-10-CM | POA: Diagnosis not present

## 2021-12-18 DIAGNOSIS — Z85850 Personal history of malignant neoplasm of thyroid: Secondary | ICD-10-CM | POA: Diagnosis not present

## 2021-12-18 DIAGNOSIS — D849 Immunodeficiency, unspecified: Secondary | ICD-10-CM | POA: Diagnosis not present

## 2021-12-18 DIAGNOSIS — Z4822 Encounter for aftercare following kidney transplant: Secondary | ICD-10-CM | POA: Diagnosis not present

## 2021-12-18 DIAGNOSIS — F322 Major depressive disorder, single episode, severe without psychotic features: Secondary | ICD-10-CM

## 2021-12-18 DIAGNOSIS — F411 Generalized anxiety disorder: Secondary | ICD-10-CM | POA: Diagnosis not present

## 2021-12-18 NOTE — Plan of Care (Signed)
  Problem: Anxiety Disorder CCP excessive worry, irritability, avoidant behaviors Goal: " I want to get rid of the anxiety,have more confidence, and find more coping skills to deal with depression" Outcome: Initial Goal: Reduce frequency and intensity of symptoms of anxiety AEB reducing worry episodes and irritability from 4 x per week to 1 x per week for 60 days per pt's self-report  Outcome: Initial Goal: STG: Report a decrease in anxiety symptoms as evidenced by an overall reduction in anxiety score by a minimum of 25% on the Generalized Anxiety Disorder Scale Outcome: Initial

## 2021-12-18 NOTE — Progress Notes (Signed)
IN- PERSON  THERAPIST PROGRESS NOTE  Session Time: Monday 12/18/2021  1:10 PM -  2:00 PM   Participation Level: Active  Behavioral Response: CasualAlertAnxious and Depressed  Type of Therapy: Individual Therapy  Treatment Goals addressed: Reduce frequency and intensity of symptoms of anxiety AEB reducing worry episodes and irritability from 4 x per week to 1 x per week for 60 days per pt's self-report /Report a decrease in anxiety symptoms as evidenced by an overall reduction in anxiety score by a minimum of 25% on the Generalized Anxiety Disorder Scale   ProgressTowards Goals: Initial  Interventions: CBT and Supportive  Summary: April Ward is a 61 y.o. female who is referred for services by psychiatrist Dr. Harrington Challenger due to pt experiencing symptoms of anxiety and depression. She denies any psychiatric hospitalizations. She is a retuning pt to this clinician and last was seen in 2021. She continues to see psychiatrist Dr. Harrington Challenger for mediction management. Pt. reports resuming therapy as she is tired of being in depressed.  Per patient's report, people have a tendency to make negative comments to her about her weight as she has lost a lot of weight.  Patient states this bothers her self-esteem.  She has been avoiding going around people.  She also reports grief and loss issues related to the death of her dog in 09/05/21.  She reports financial stress due to trying to pay bills as well as by her medication.  Current symptoms include depressed mood, irritability, poor self-esteem, excessive worry, avoidant behaviors, and sleep difficulty.  Patient last was seen for the assessment appointment about 2 to 3 weeks ago.  She continues to experience symptoms of anxiety and depression but with decreased frequency and intensity as reflected in PHQ 2 and 9 and GAD-7.  Patient attributes recent trip to the beach for family reunion as the cause of decreased symptoms of depression.  Patient reports very much enjoying  the trip.  She still reports becoming irritated very easily and states this happens about 3-4 times per week.  She continues to have some issues regarding self-esteem but has been more assertive and set/maintain limits with people when they make negative comments about her weight.  She reports still preferring to be at home most of the time but does continue to attend church.  She reports increased stress and worry about her sister as she recently learned sister has poor kidney functioning.  She expresses frustration as sister has not been sharing information with family about her condition.  She has been trying to help sister get an appointment with her nephrologist.  Sister's condition triggers fears and thoughts related to her other sister dying 2 years ago due to kidney issues.  Patient also reports worry about the upcoming change in seasons and the effects it may have on her level of depression.  Suicidal/Homicidal: Nowithout intent/plan  Therapist Response: Reviewed symptoms, administered PHQ 2 and 9 and GAD-7, discussed results, using reinforced patient's increased socialization, discussed effects, discussed stressors, facilitated expression of thoughts and feelings, validated feelings, praised and reinforced patient's use of assertiveness skills, discussed effects, develop treatment plan, obtained patient's permission to electronically sign plan for patient, discussed current activities that help patient relax, encouraged patient to continue her efforts in gardening  Plan: Return again in 2 weeks.  Diagnosis: Major depressive disorder, single episode, severe without psychotic features ([place])  GAD (generalized anxiety disorder)  Collaboration of Care: Psychiatrist AEB patient working with psychiatrist Dr. Harrington Challenger  Patient/Guardian was advised Release  of Information must be obtained prior to any record release in order to collaborate their care with an outside provider. Patient/Guardian was advised  if they have not already done so to contact the registration department to sign all necessary forms in order for [place] to release information regarding their care.   Consent: Patient/Guardian gives verbal consent for treatment and assignment of benefits for services provided during this visit. Patient/Guardian expressed understanding and agreed to proceed.   Alonza Smoker, LCSW 12/18/2021

## 2021-12-21 ENCOUNTER — Encounter: Payer: Self-pay | Admitting: *Deleted

## 2021-12-22 ENCOUNTER — Ambulatory Visit: Payer: Medicare PPO | Admitting: Cardiology

## 2021-12-22 NOTE — Progress Notes (Deleted)
Clinical Summary April Ward is a 61 y.o.female  seen today for follow up of the following medical problems.      1. HTN   - reports diagnosed with HTN age 12. Historically has been difficult to control - CT scan in 2006 with normal renal arteries. TSH 1.80. No EtoH. Normal renin and aldo ratio. Normal sleep study 10/2013 at [facility].   - since gastric sleeve surgery she has had significant weight loss, bp's have trended down and she has been taken off some of her bp meds    - home bp's 140s/80s, overall trending up. Has had 16 lbs weight gain since Jan 2021   3. Obesity - had gastric sleeve procedure done, significant weight loss since that time.    4. ESRD - followed Dr Kris M[facility]on at [facility].   - 12/2020 had kidney transplant        5. History of renal cell CA - s/p left partial nephrectomy in 2014     6. 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 60 tablet 2   buPROPion (WELLBUTRIN XL) 300 MG 24 hr tablet Take 1 tablet (300 mg total) by mouth every morning. 90 tablet 2   cyproheptadine (PERIACTIN) 4 MG tablet Take 1 tablet (4 mg total) by mouth at bedtime. 30 tablet 2   escitalopram (LEXAPRO) 20 MG tablet Take 1 tablet (20 mg total) by mouth daily. 30 tablet 2   levothyroxine (SYNTHROID, LEVOTHROID) 200 MCG tablet Take 1  tablet by mouth daily. X 6 days of the week & 322mg on one day of the week     MAGNESIUM PO Take by mouth.     POTASSIUM PO Take by mouth.     PREDNISONE PO Take by mouth.     Sulfamethoxazole-Trimethoprim (BACTRIM PO) Take by mouth.     tacrolimus (PROGRAF) 1 MG capsule Take by mouth.     traZODone (DESYREL) 150 MG tablet Take 1 tablet (150 mg total) by mouth at bedtime. 30 tablet 2   No current facility-administered medications for this visit.     Past Surgical History:  Procedure Laterality Date   CESAREAN SECTION  [id number]  CHONDROPLASTY Right 08/22/2012   Procedure: CHONDROPLASTY;  Surgeon: SCarole Civil MD;  Location: [facility];  Service: Orthopedics;  Laterality: Right;   COLONOSCOPY WITH PROPOFOL N/A 03/16/2014   Procedure: COLONOSCOPY WITH PROPOFOL (at cecum 1023, total withdrawal time=9 minutes);  Surgeon: SDanie Binder MD;  Location: [facility];  Service: Endoscopy;  Laterality: N/A;   GASTRIC BYPASS     KIDNEY TRANSPLANT  01/22/2021   KNEE ARTHROSCOPY WITH MEDIAL MENISECTOMY Right 08/22/2012   Procedure: KNEE ARTHROSCOPY WITH PARTIAL MEDIAL MENISECTOMY;  Surgeon: Carole Civil, MD;  Location: [facility];  Service: Orthopedics;  Laterality: Right;   PARTIAL NEPHRECTOMY  03/02/2013   left   TENDON REPAIR       Allergies  Allergen Reactions   Skelaxin [Metaxalone] Hives and Rash      Family History  Problem Relation Age of Onset   Heart attack Mother    Heart attack Father    Anxiety disorder Sister    Colon cancer Maternal Aunt    Depression Paternal Aunt    Alcohol abuse Maternal Uncle    Colon cancer Maternal Uncle    Depression Son    Anxiety disorder Son      Social History April Ward reports that she has never smoked. She has never used smokeless tobacco. April Ward reports no history of alcohol use.   Review of Systems CONSTITUTIONAL: No weight loss, fever, chills, weakness or fatigue.  HEENT: Eyes: No visual loss, blurred vision, double  vision or yellow sclerae.No hearing loss, sneezing, congestion, runny nose or sore throat.  SKIN: No rash or itching.  CARDIOVASCULAR:  RESPIRATORY: No shortness of breath, cough or sputum.  GASTROINTESTINAL: No anorexia, nausea, vomiting or diarrhea. No abdominal pain or blood.  GENITOURINARY: No burning on urination, no polyuria NEUROLOGICAL: No headache, dizziness, syncope, paralysis, ataxia, numbness or tingling in the extremities. No change in bowel or bladder control.  MUSCULOSKELETAL: No muscle, back pain, joint pain or stiffness.  LYMPHATICS: No enlarged nodes. No history of splenectomy.  PSYCHIATRIC: No history of depression or anxiety.  ENDOCRINOLOGIC: No reports of sweating, cold or heat intolerance. No polyuria or polydipsia.  Marland Kitchen   Physical Examination There were no vitals filed for this visit. There were no vitals filed for this visit.  Gen: resting comfortably, no acute distress HEENT: no scleral icterus, pupils equal round and reactive, no palptable cervical adenopathy,  CV Resp: Clear to auscultation bilaterally GI: abdomen is soft, non-tender, non-distended, normal bowel sounds, no hepatosplenomegaly MSK: extremities are warm, no edema.  Skin: warm, no rash Neuro:  no focal deficits Psych: appropriate affect   Diagnostic Studies  09/2013 Carotid US 1-39% bilateral ICA disease   09/2013 Exercise MPI Overall low risk exercise/Lexiscan Cardiolite. Patient had limited exercise capacity achieving maximum work load of 7 METS, limited by shortness of breath and hypertensive response. There were no clearly diagnostic ST segment abnormalities. Occasional to frequent PVCs and ventricular couplets were noted early in exercise. There were no sustained arrhythmias. Perfusion imaging shows probable variable soft tissue attenuation, less likely a minor degree of basal lateral ischemia. LVEF is normal at 61% with upper normal chamber volume and no obvious wall motion  abnormalities.     Jan 2020 Stress echo [facility] eval NORMAL STRESS TEST.  NO VALVULAR REGURGITATION  NO VALVULAR STENOSIS  Maximum workload of 10.10 METs was achieved during exercise.  RESTING HYPERTENSION - EXAGGERATED RESPONSE     Jan 2020 echo NORMAL LEFT VENTRICULAR SYSTOLIC FUNCTION WITH MODERATE LVH   NORMAL RIGHT VENTRICULAR SYSTOLIC FUNCTION   VALVULAR REGURGITATION: MILD MR, TRIVIAL PR, TRIVIAL TR   VALVULAR STENOSIS: TRIVIAL AS   NO PRIOR STUDY FOR COMPARISON   3D acquisition and reconstructions were performed as part of this   examination to more accurately quantify the effects of heart failure   regardless of ejection fraction. (post-processing on an Independent   workstation).     05/2019 INTERPRETATION ---------------------------------------------------------------    INDETERMINATE. NORMAL RESTING STUDY WITH NO WALL MOTION ABNORMALITIES AT  REST AND PEAK STRESS.   NORMAL LA PRESSURES WITH NORMAL DIASTOLIC FUNCTION   VALVULAR REGURGITATION: TRIVIAL MR, MILD PR, TRIVIAL TR   VALVULAR STENOSIS: MILD AS   Note: C/W PRIOR ECHO 04/16/18: INCREASED AS GRADIENT   Maximum workload of  8.50 METs was achieved during exercise.   RESTING HYPERTENSION - APPROPRIATE RESPONSE    Assessment and Plan   1. HTN - bp's above goal, likely have trended up due to weight gain this year - increase losartan to 100 mg daily, has labs coming up with nephrology already   2. Aortic stenosis -  Mild by recent [facility] echo, contineu to monitor     EKG today shows SR, no ischemic changes     Arnoldo Lenis, M.D., F.A.C.C.

## 2021-12-26 ENCOUNTER — Encounter: Payer: Self-pay | Admitting: Cardiology

## 2021-12-26 ENCOUNTER — Ambulatory Visit: Payer: Medicare PPO | Attending: Cardiology | Admitting: Cardiology

## 2021-12-26 VITALS — BP 140/90 | HR 64 | Ht 71.5 in | Wt 147.0 lb

## 2021-12-26 DIAGNOSIS — I10 Essential (primary) hypertension: Secondary | ICD-10-CM | POA: Diagnosis not present

## 2021-12-26 DIAGNOSIS — I350 Nonrheumatic aortic (valve) stenosis: Secondary | ICD-10-CM | POA: Diagnosis not present

## 2021-12-26 NOTE — Patient Instructions (Signed)

## 2021-12-26 NOTE — Progress Notes (Signed)
Clinical Summary April Ward is a 61 y.o.female seen today for follow up of the following medical problems.      1. HTN   - reports diagnosed with HTN age 1. Historically has been difficult to control - CT scan in 2006 with normal renal arteries. TSH 1.80. No EtoH. Normal renin and aldo ratio. Normal sleep study 10/2013 at [facility]   - since gastric sleeve surgery she has had significant weight loss, bp's have trended down and she has been taken off some of her bp meds    - home bp's 140s/80s, overall trending up. Has had 16 lbs weight gain since Jan 2021  From 12/18/21 nephrology note: "BP in clinic today was 126/69 (HR 87). Home SBP ranges 120-130, DBP ranges 60-80 on current regimen of Amlodipine PRN" - she is down around 70 lbs since I last saw her in 2021 - currently not on HTN meds    2. ESRD, prevoiusly peritoneal dialysis now s/p transplant - followed Dr Kris M[facility]on at [facility].   - 12/2020 had kidney transplant     3. History of renal cell CA - s/p left partial nephrectomy in 2014     4. 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 60 tablet 2   buPROPion (WELLBUTRIN XL) 300 MG 24 hr tablet Take 1 tablet (300 mg total) by mouth every morning. 90 tablet 2   cyproheptadine  (PERIACTIN) 4 MG tablet Take 1 tablet (4 mg total) by mouth at bedtime. 30 tablet 2   escitalopram (LEXAPRO) 20 MG tablet Take 1 tablet (20 mg total) by mouth daily. 30 tablet 2   levothyroxine (SYNTHROID, LEVOTHROID) 200 MCG tablet Take 1 tablet by mouth daily. X 6 days of the week & 332mg on one day of the week     MAGNESIUM PO Take by mouth.     POTASSIUM PO Take by mouth.     PREDNISONE PO Take by mouth.     Sulfamethoxazole-Trimethoprim (BACTRIM PO) Take by mouth.     tacrolimus (PROGRAF) 1 MG capsule Take by mouth.     traZODone (DESYREL) 150 MG tablet Take 1 tablet (150 mg total) by mouth at bedtime. 30 tablet 2   No current facility-administered medications for this visit.     Past Surgical History:  Procedure Laterality Date   CESAREAN SECTION  [id number]  CHONDROPLASTY Right 08/22/2012   Procedure: CHONDROPLASTY;  Surgeon: SCarole Civil MD;  Location: [facility];  Service: Orthopedics;  Laterality: Right;   COLONOSCOPY WITH PROPOFOL N/A 03/16/2014   Procedure: COLONOSCOPY WITH PROPOFOL (at cecum 1023, total withdrawal time=9 minutes);  Surgeon: SDanie Binder MD;  Location:  [facility];  Service: Endoscopy;  Laterality: N/A;   GASTRIC BYPASS     KIDNEY TRANSPLANT  01/22/2021   KNEE ARTHROSCOPY WITH MEDIAL MENISECTOMY Right 08/22/2012   Procedure: KNEE ARTHROSCOPY WITH PARTIAL MEDIAL MENISECTOMY;  Surgeon: Carole Civil, MD;  Location: [facility];  Service: Orthopedics;  Laterality: Right;   PARTIAL NEPHRECTOMY  03/02/2013   left   TENDON REPAIR       Allergies  Allergen Reactions   Skelaxin [Metaxalone] Hives and Rash      Family History  Problem Relation Age of Onset   Heart attack Mother    Heart attack Father    Anxiety disorder Sister    Colon cancer Maternal Aunt    Depression Paternal Aunt    Alcohol abuse Maternal Uncle    Colon cancer Maternal Uncle    Depression Son    Anxiety disorder Son      Social History April Ward reports that she has never  smoked. She has never used smokeless tobacco. April Ward reports no history of alcohol use.   Review of Systems CONSTITUTIONAL: No weight loss, fever, chills, weakness or fatigue.  HEENT: Eyes: No visual loss, blurred vision, double vision or yellow sclerae.No hearing loss, sneezing, congestion, runny nose or sore throat.  SKIN: No rash or itching.  CARDIOVASCULAR: no chest pain, no palpitations.  RESPIRATORY: No shortness of breath, cough or sputum.  GASTROINTESTINAL: No anorexia, nausea, vomiting or diarrhea. No abdominal pain or blood.  GENITOURINARY: No burning on urination, no polyuria NEUROLOGICAL: No headache, dizziness, syncope, paralysis, ataxia, numbness or tingling in the extremities. No change in bowel or bladder control.  MUSCULOSKELETAL: No muscle, back pain, joint pain or stiffness.  LYMPHATICS: No enlarged nodes. No history of splenectomy.  PSYCHIATRIC: No history of depression or anxiety.  ENDOCRINOLOGIC: No reports of sweating, cold or heat intolerance. No polyuria or polydipsia.  Marland Kitchen   Physical Examination Today's Vitals   12/26/21 1503 12/26/21 1523  BP: (!) 160/90 (!) 140/90  Pulse: 64   Weight: 147 lb (66.7 kg)   Height: 5' 11.5" (1.816 m)    Body mass index is 20.22 kg/m.  Gen: resting comfortably, no acute distress HEENT: no scleral icterus, pupils equal round and reactive, no palptable cervical adenopathy,  CV: RRR, 2/6 systolic murmur rusb, no jvd Resp: Clear to auscultation bilaterally GI: abdomen is soft, non-tender, non-distended, normal bowel sounds, no hepatosplenomegaly MSK: extremities are warm, no edema.  Skin: warm, no rash Neuro:  no focal deficits Psych: appropriate affect   Diagnostic Studies  09/2013 Carotid US 1-39% bilateral ICA disease   09/2013 Exercise MPI Overall low risk exercise/Lexiscan Cardiolite. Patient had limited exercise capacity achieving maximum work load of 7 METS, limited by shortness of breath and hypertensive  response. There were no clearly diagnostic ST segment abnormalities. Occasional to frequent PVCs and ventricular couplets were noted early in exercise. There were no sustained arrhythmias. Perfusion imaging shows probable variable soft tissue attenuation, less likely a minor degree of basal lateral ischemia. LVEF is normal at 61% with upper normal chamber volume and no obvious wall motion abnormalities.     Jan 2020 Stress echo [facility] eval NORMAL STRESS TEST.  NO VALVULAR REGURGITATION  NO VALVULAR STENOSIS  Maximum workload of 10.10 METs was achieved during exercise.  RESTING HYPERTENSION - EXAGGERATED RESPONSE     Jan 2020 echo NORMAL LEFT VENTRICULAR SYSTOLIC FUNCTION WITH MODERATE LVH   NORMAL RIGHT VENTRICULAR SYSTOLIC FUNCTION   VALVULAR REGURGITATION: MILD MR, TRIVIAL  PR, TRIVIAL TR   VALVULAR STENOSIS: TRIVIAL AS   NO PRIOR STUDY FOR COMPARISON   3D acquisition and reconstructions were performed as part of this   examination to more accurately quantify the effects of heart failure   regardless of ejection fraction. (post-processing on an Independent   workstation).     05/2019 INTERPRETATION ---------------------------------------------------------------    INDETERMINATE. NORMAL RESTING STUDY WITH NO WALL MOTION ABNORMALITIES AT   REST AND PEAK STRESS.   NORMAL LA PRESSURES WITH NORMAL DIASTOLIC FUNCTION   VALVULAR REGURGITATION: TRIVIAL MR, MILD PR, TRIVIAL TR   VALVULAR STENOSIS: MILD AS   Note: C/W PRIOR ECHO 04/16/18: INCREASED AS GRADIENT   Maximum workload of  8.50 METs was achieved during exercise.   RESTING HYPERTENSION - APPROPRIATE RESPONSE      Assessment and Plan  1. HTN - much improved with dramatic weight loss over the last several years, she is also s/p kidney transplant - bp elevated here but home numbers at goal, recent bp at transplant nephrology at goal - continue to monitor, in general would defer future bp agents to transplant team   given kidney disease and immunosuppressanty interactions.    2. Aortic stenosis -  mild to moderate by echo, the 12/2020 [facility] echo reports AVA 1.3 but no gradients included - repeat echo likely 2 years   F/u 1 year    Arnoldo Lenis, M.D.

## 2022-01-01 ENCOUNTER — Ambulatory Visit (HOSPITAL_COMMUNITY): Payer: Medicare PPO | Admitting: Psychiatry

## 2022-01-02 ENCOUNTER — Ambulatory Visit (HOSPITAL_COMMUNITY): Payer: Medicare PPO | Admitting: Psychiatry

## 2022-01-03 ENCOUNTER — Ambulatory Visit (INDEPENDENT_AMBULATORY_CARE_PROVIDER_SITE_OTHER): Payer: Medicare PPO | Admitting: Psychiatry

## 2022-01-03 ENCOUNTER — Encounter (HOSPITAL_COMMUNITY): Payer: Self-pay | Admitting: Psychiatry

## 2022-01-03 VITALS — BP 158/88 | HR 69 | Ht 71.5 in | Wt 141.8 lb

## 2022-01-03 DIAGNOSIS — F411 Generalized anxiety disorder: Secondary | ICD-10-CM

## 2022-01-03 DIAGNOSIS — F322 Major depressive disorder, single episode, severe without psychotic features: Secondary | ICD-10-CM

## 2022-01-03 MED ORDER — BUPROPION HCL ER (XL) 300 MG PO TB24: 300.0000 mg | ORAL_TABLET | ORAL | 2 refills | Status: DC

## 2022-01-03 MED ORDER — PAROXETINE HCL 20 MG PO TABS: 20.0000 mg | ORAL_TABLET | Freq: Every day | ORAL | 2 refills | Status: DC

## 2022-01-03 MED ORDER — ALPRAZOLAM 1 MG PO TABS: 1.0000 mg | ORAL_TABLET | Freq: Two times a day (BID) | ORAL | 2 refills | Status: DC | PRN

## 2022-01-03 MED ORDER — TRAZODONE HCL 150 MG PO TABS: 150.0000 mg | ORAL_TABLET | Freq: Every day | ORAL | 2 refills | Status: DC

## 2022-01-03 NOTE — Progress Notes (Signed)
[facility] MD/PA/NP OP Progress Note  01/03/2022 2:42 PM April Ward  MRN:  [id number]  Chief Complaint:  Chief Complaint  Patient presents with   Depression   Anxiety   Follow-up   HPI: This patient is a 61 year old widowed black female who lives with her son and asked in [place].  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  The patient returns for follow-up after 4 weeks.  She is still not doing much better.  She is sad and down most of the time.  She really misses her dog who died in 2022/09/09.  This was her main companion.  She was doing better with her weight but now she is lost again down to 141 pounds.  She claims she eats frequent small meals.  She wonders if her thyroid is overactive again which initially caused all the weight loss.  She is messaged her physician about this but has not heard back so we will go ahead and check a thyroid panel today.  The addition of Lexapro does not seem to have helped so we will change it to paroxetine which may help more with appetite.  The Periactin has not helped.  She denies any thoughts of self-harm or suicide. Visit Diagnosis:    ICD-10-CM   1. Major depressive disorder, single episode, severe without psychotic features ([place])  F32.2 Thyroid Panel With TSH    2. GAD (generalized anxiety disorder)  F41.1       Past Psychiatric History: Long-term outpatient treatment  Past Medical History:  Past Medical History:  Diagnosis Date   Anxiety    Arthritis    Depression    Diabetes mellitus without complication ([place])    GERD (gastroesophageal reflux disease)    Gout    Heart murmur    Hypertension    Renal cancer ([place])    Renal disease 10/2012   Renal insufficiency     Past Surgical History:  Procedure Laterality Date   CESAREAN SECTION  [id number]   CHONDROPLASTY Right 08/22/2012   Procedure: CHONDROPLASTY;  Surgeon: Carole Civil, MD;  Location: [facility];  Service: Orthopedics;  Laterality: Right;   COLONOSCOPY WITH  PROPOFOL N/A 03/16/2014   Procedure: COLONOSCOPY WITH PROPOFOL (at cecum 1023, total withdrawal time=9 minutes);  Surgeon: Danie Binder, MD;  Location: [facility];  Service: Endoscopy;  Laterality: N/A;   GASTRIC BYPASS     KIDNEY TRANSPLANT  01/22/2021   KNEE ARTHROSCOPY WITH MEDIAL MENISECTOMY Right 08/22/2012   Procedure: KNEE ARTHROSCOPY WITH PARTIAL MEDIAL MENISECTOMY;  Surgeon: Carole Civil, MD;  Location: [facility];  Service: Orthopedics;  Laterality: Right;   PARTIAL NEPHRECTOMY  03/02/2013   left   TENDON REPAIR      Family Psychiatric History: See below  Family History:  Family History  Problem Relation Age of Onset   Heart attack Mother    Heart attack Father    Anxiety disorder Sister    Colon cancer Maternal Aunt    Depression Paternal Aunt    Alcohol abuse Maternal Uncle    Colon cancer Maternal Uncle    Depression Son    Anxiety disorder Son     Social History:  Social History   Socioeconomic History   Marital status: Widowed    Spouse name: Not on file   Number of children: Not on file   Years of education: Not on file   Highest education level: Not on file  Occupational History  Occupation: Visual merchandiser: [employer]  Tobacco Use   Smoking status: Never    Passive exposure: Never   Smokeless tobacco: Never  Substance and Sexual Activity   Alcohol use: No    Alcohol/week: 0.0 standard drinks of alcohol   Drug use: No   Sexual activity: Not on file  Other Topics Concern   Not on file  Social History Narrative   Not on file   Social Determinants of Health   Financial Resource Strain: Not on file  Food Insecurity: No Food Insecurity (10/26/2021)   Hunger Vital Sign    Worried About Running Out of Food in the Last Year: Never true    Ran Out of Food in the Last Year: Never true  Transportation Needs: No Transportation Needs (10/26/2021)   PRAPARE - Hydrologist (Medical): No    Lack of  Transportation (Non-Medical): No  Physical Activity: Not on file  Stress: Not on file  Social Connections: Not on file    Allergies:  Allergies  Allergen Reactions   Gentamicin Sulfate Other (See Comments)    Per patient "blisters" blisters    Skelaxin [Metaxalone] Hives and Rash   Tape Rash and Other (See Comments)    Per patient "blisters"     Metabolic Disorder Labs: No results found for: "HGBA1C", "MPG" No results found for: "PROLACTIN" Lab Results  Component Value Date   CHOL 215 (H) 03/11/2014   TRIG 288 (H) 03/11/2014   HDL 33 (L) 03/11/2014   CHOLHDL 6.5 03/11/2014   VLDL 58 (H) 03/11/2014   LDLCALC 124 (H) 03/11/2014   No results found for: "TSH"  Therapeutic Level Labs: No results found for: "LITHIUM" No results found for: "VALPROATE" No results found for: "CBMZ"  Current Medications: Current Outpatient Medications  Medication Sig Dispense Refill   PARoxetine (PAXIL) 20 MG tablet Take 1 tablet (20 mg total) by mouth daily. 30 tablet 2   ALPRAZolam (XANAX) 1 MG tablet Take 1 tablet (1 mg total) by mouth 2 (two) times daily as needed for anxiety. 60 tablet 2   buPROPion (WELLBUTRIN XL) 300 MG 24 hr tablet Take 1 tablet (300 mg total) by mouth every morning. 90 tablet 2   levothyroxine (SYNTHROID, LEVOTHROID) 200 MCG tablet Take 1 tablet by mouth daily.     MAGNESIUM PO Take 1 tablet by mouth daily.     POTASSIUM PO Take 1 tablet by mouth daily.     predniSONE (DELTASONE) 5 MG tablet Take 1 tablet by mouth daily.     Sulfamethoxazole-Trimethoprim (BACTRIM PO) Take 1 tablet by mouth. Every Monday, Wednesday and Friday     tacrolimus (PROGRAF) 1 MG capsule Take 6 mg by mouth 2 (two) times daily.     traZODone (DESYREL) 150 MG tablet Take 1 tablet (150 mg total) by mouth at bedtime. 30 tablet 2   No current facility-administered medications for this visit.     Musculoskeletal: Strength & Muscle Tone: within normal limits Gait & Station: normal Patient  leans: N/A  Psychiatric Specialty Exam: Review of Systems  Constitutional:  Positive for appetite change and unexpected weight change.  Psychiatric/Behavioral:  Positive for decreased concentration and dysphoric mood. The patient is nervous/anxious.   All other systems reviewed and are negative.   Blood pressure (!) 158/88, pulse 69, height 5' 11.5" (1.816 m), weight 141 lb 12.8 oz (64.3 kg), SpO2 100 %.Body mass index is 19.5 kg/m.  General Appearance: Casual and Fairly  Groomed  Eye Contact:  Good  Speech:  Clear and Coherent  Volume:  Normal  Mood:  Depressed  Affect:  Flat  Thought Process:  Goal Directed  Orientation:  Full (Time, Place, and Person)  Thought Content: Rumination   Suicidal Thoughts:  No  Homicidal Thoughts:  No  Memory:  Immediate;   Good Recent;   Good Remote;   Good  Judgement:  Good  Insight:  Fair  Psychomotor Activity:  Decreased  Concentration:  Concentration: Good and Attention Span: Good  Recall:  Good  Fund of Knowledge: Good  Language: Good  Akathisia:  No  Handed:  Right  AIMS (if indicated): not done  Assets:  Communication Skills Desire for Improvement Resilience Social Support Talents/Skills  ADL's:  Intact  Cognition: WNL  Sleep:  Fair   Screenings: GAD-7    Health and safety inspector from 12/18/2021 in [facility] from 11/22/2021 in [facility]  Total GAD-7 Score 11 16      PHQ2-9    [facility] Office Visit from 01/03/2022 in [facility] Counselor from 12/18/2021 in [facility] Counselor from 11/22/2021 in [facility] Office Visit from 11/20/2021 in [facility] Office Visit from 10/19/2021 in [facility]  PHQ-2 Total Score '4 2 6  6 3  '$ PHQ-9 Total Score '13 14 20 17 13      '$ [facility] Office Visit from 01/03/2022 in [facility] Counsel[facility] from 11/22/2021 in [facility] Office Visit from 11/20/2021 in [facility] No Risk No Risk No Risk        Assessment and Plan: This patient is a 61 year old female with a history of end-stage renal disease, status post kidney transplant depression and anxiety.  She continues to lose weight.  We will check a thyroid panel today.  The Lexapro has not been helpful so we will switch it to paroxetine 20 mg daily and continue the Wellbutrin XL 300 mg daily for depression and Xanax 1 mg twice daily for anxiety and trazodone 150 mg at bedtime for sleep.  She will return to see me in 4 weeks  Collaboration of Care: Collaboration of Care: Referral or follow-up with counselor/therapist AEB patient will follow-up with Maurice Small in our office for therapy  Patient/Guardian was advised Release of Information must be obtained prior to any record release in order to collaborate their care with an outside provider. Patient/Guardian was advised if they have not already done so to contact the registration department to sign all necessary forms in order for [place] to release information regarding their care.   Consent: Patient/Guardian gives verbal consent for treatment and assignment of benefits for services provided during this visit. Patient/Guardian expressed understanding and agreed to proceed.    [name], MD 01/03/2022, 2:42 PM

## 2022-01-04 LAB — THYROID PANEL WITH TSH
Free Thyroxine Index: 4.2 — ABNORMAL HIGH (ref 1.4–3.8)
T3 Uptake: 36 % — ABNORMAL HIGH (ref 22–35)
T4, Total: 11.8 ug/dL (ref 5.1–11.9)
TSH: 0.04 mIU/L — ABNORMAL LOW (ref 0.40–4.50)

## 2022-01-08 DIAGNOSIS — Z4822 Encounter for aftercare following kidney transplant: Secondary | ICD-10-CM | POA: Diagnosis not present

## 2022-01-08 DIAGNOSIS — I10 Essential (primary) hypertension: Secondary | ICD-10-CM | POA: Diagnosis not present

## 2022-01-08 DIAGNOSIS — Z940 Kidney transplant status: Secondary | ICD-10-CM | POA: Diagnosis not present

## 2022-01-08 DIAGNOSIS — E039 Hypothyroidism, unspecified: Secondary | ICD-10-CM | POA: Diagnosis not present

## 2022-01-08 DIAGNOSIS — E1169 Type 2 diabetes mellitus with other specified complication: Secondary | ICD-10-CM | POA: Diagnosis not present

## 2022-01-09 ENCOUNTER — Ambulatory Visit (INDEPENDENT_AMBULATORY_CARE_PROVIDER_SITE_OTHER): Payer: Medicare PPO | Admitting: Psychiatry

## 2022-01-09 DIAGNOSIS — F322 Major depressive disorder, single episode, severe without psychotic features: Secondary | ICD-10-CM | POA: Diagnosis not present

## 2022-01-09 DIAGNOSIS — F411 Generalized anxiety disorder: Secondary | ICD-10-CM | POA: Diagnosis not present
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
## 2022-01-10 DIAGNOSIS — Z4822 Encounter for aftercare following kidney transplant: Secondary | ICD-10-CM | POA: Diagnosis not present

## 2022-01-10 DIAGNOSIS — Z5181 Encounter for therapeutic drug level monitoring: Secondary | ICD-10-CM | POA: Diagnosis not present

## 2022-01-10 DIAGNOSIS — Z79621 Long term (current) use of calcineurin inhibitor: Secondary | ICD-10-CM | POA: Diagnosis not present

## 2022-01-15 ENCOUNTER — Encounter (HOSPITAL_COMMUNITY): Payer: Self-pay

## 2022-01-15 ENCOUNTER — Ambulatory Visit (HOSPITAL_COMMUNITY): Payer: Medicare PPO | Admitting: Psychiatry

## 2022-01-15 DIAGNOSIS — Z4822 Encounter for aftercare following kidney transplant: Secondary | ICD-10-CM | POA: Diagnosis not present

## 2022-01-15 DIAGNOSIS — Z23 Encounter for immunization: Secondary | ICD-10-CM | POA: Diagnosis not present

## 2022-01-15 DIAGNOSIS — Z7952 Long term (current) use of systemic steroids: Secondary | ICD-10-CM | POA: Diagnosis not present

## 2022-01-15 DIAGNOSIS — I951 Orthostatic hypotension: Secondary | ICD-10-CM | POA: Diagnosis not present

## 2022-01-15 DIAGNOSIS — Z79621 Long term (current) use of calcineurin inhibitor: Secondary | ICD-10-CM | POA: Diagnosis not present

## 2022-01-15 DIAGNOSIS — Z792 Long term (current) use of antibiotics: Secondary | ICD-10-CM | POA: Diagnosis not present

## 2022-01-22 DIAGNOSIS — S92511A Displaced fracture of proximal phalanx of right lesser toe(s), initial encounter for closed fracture: Secondary | ICD-10-CM | POA: Diagnosis not present

## 2022-01-22 DIAGNOSIS — Z888 Allergy status to other drugs, medicaments and biological substances status: Secondary | ICD-10-CM | POA: Diagnosis not present

## 2022-01-22 DIAGNOSIS — W2203XA Walked into furniture, initial encounter: Secondary | ICD-10-CM | POA: Diagnosis not present

## 2022-01-22 DIAGNOSIS — R7989 Other specified abnormal findings of blood chemistry: Secondary | ICD-10-CM | POA: Diagnosis not present

## 2022-01-22 DIAGNOSIS — D849 Immunodeficiency, unspecified: Secondary | ICD-10-CM | POA: Diagnosis not present

## 2022-01-22 DIAGNOSIS — M79671 Pain in right foot: Secondary | ICD-10-CM | POA: Diagnosis not present

## 2022-01-22 DIAGNOSIS — Z881 Allergy status to other antibiotic agents status: Secondary | ICD-10-CM | POA: Diagnosis not present

## 2022-01-22 DIAGNOSIS — S99921A Unspecified injury of right foot, initial encounter: Secondary | ICD-10-CM | POA: Diagnosis not present

## 2022-01-22 DIAGNOSIS — Z940 Kidney transplant status: Secondary | ICD-10-CM | POA: Diagnosis not present

## 2022-01-22 DIAGNOSIS — Z79899 Other long term (current) drug therapy: Secondary | ICD-10-CM | POA: Diagnosis not present

## 2022-01-31 ENCOUNTER — Ambulatory Visit (INDEPENDENT_AMBULATORY_CARE_PROVIDER_SITE_OTHER): Payer: Medicare PPO | Admitting: Psychiatry

## 2022-01-31 ENCOUNTER — Encounter (HOSPITAL_COMMUNITY): Payer: Self-pay | Admitting: Psychiatry

## 2022-01-31 VITALS — BP 187/77 | HR 53 | Ht 71.5 in | Wt 158.0 lb

## 2022-01-31 DIAGNOSIS — F322 Major depressive disorder, single episode, severe without psychotic features: Secondary | ICD-10-CM | POA: Diagnosis not present

## 2022-01-31 DIAGNOSIS — F411 Generalized anxiety disorder: Secondary | ICD-10-CM

## 2022-01-31 MED ORDER — BUPROPION HCL ER (XL) 300 MG PO TB24: 300.0000 mg | ORAL_TABLET | ORAL | 2 refills | Status: DC

## 2022-01-31 MED ORDER — TRAZODONE HCL 150 MG PO TABS: 150.0000 mg | ORAL_TABLET | Freq: Every day | ORAL | 2 refills | Status: DC

## 2022-01-31 MED ORDER — ALPRAZOLAM 1 MG PO TABS: 1.0000 mg | ORAL_TABLET | Freq: Two times a day (BID) | ORAL | 2 refills | Status: DC | PRN

## 2022-01-31 MED ORDER — PAROXETINE HCL 20 MG PO TABS: 20.0000 mg | ORAL_TABLET | Freq: Every day | ORAL | 2 refills | Status: DC

## 2022-01-31 NOTE — Progress Notes (Signed)
[facility] MD/PA/NP OP Progress Note  01/31/2022 2:18 PM KEARSTEN GINTHER  MRN:  [id number]  Chief Complaint:  Chief Complaint  Patient presents with   Depression   Anxiety   Follow-up   HPI: This patient is a 61 year old widowed black female who lives with her son in [place].  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  The patient returns for follow-up after 4 weeks.  Last time we did her thyroid panel because she had lost so much weight.  Actually her TSH was suppressed and her endocrinologist has not increased her Synthroid.  She is feeling better eating more and she has gained some weight.  She feels a bit less anxious.  We also started paroxetine which seems to have helped her mood.  She still feels lonely after losing her husband and her dog but is spending time with other family members.  She likes to be outside when it is warm and enjoys working in her garden.  She denies any thoughts of self-harm or suicide. Visit Diagnosis:    ICD-10-CM   1. Major depressive disorder, single episode, severe without psychotic features ([place])  F32.2     2. GAD (generalized anxiety disorder)  F41.1       Past Psychiatric History: Long-term outpatient treatment  Past Medical History:  Past Medical History:  Diagnosis Date   Anxiety    Arthritis    Depression    Diabetes mellitus without complication ([facility])    GERD (gastroesophageal reflux disease)    Gout    Heart murmur    Hypertension    Renal cancer [facility])    Renal disease 10/2012   Renal insufficiency     Past Surgical History:  Procedure Laterality Date   CESAREAN SECTION  0109;3235   CHONDROPLASTY Right 08/22/2012   Procedure: CHONDROPLASTY;  Surgeon: Carole Civil, MD;  Location: [facility];  Service: Orthopedics;  Laterality: Right;   COLONOSCOPY WITH PROPOFOL N/A 03/16/2014   Procedure: COLONOSCOPY WITH PROPOFOL (at cecum 1023, total withdrawal time=9 minutes);  Surgeon: Danie Binder, MD;  Location: [facility];   Service: Endoscopy;  Laterality: N/A;   GASTRIC BYPASS     KIDNEY TRANSPLANT  01/22/2021   KNEE ARTHROSCOPY WITH MEDIAL MENISECTOMY Right 08/22/2012   Procedure: KNEE ARTHROSCOPY WITH PARTIAL MEDIAL MENISECTOMY;  Surgeon: Carole Civil, MD;  Location: [facility];  Service: Orthopedics;  Laterality: Right;   PARTIAL NEPHRECTOMY  03/02/2013   left   TENDON REPAIR      Family Psychiatric History: See below  Family History:  Family History  Problem Relation Age of Onset   Heart attack Mother    Heart attack Father    Anxiety disorder Sister    Colon cancer Maternal Aunt    Depression Paternal Aunt    Alcohol abuse Maternal Uncle    Colon cancer Maternal Uncle    Depression Son    Anxiety disorder Son     Social History:  Social History   Socioeconomic History   Marital status: Widowed    Spouse name: Not on file   Number of children: Not on file   Years of education: Not on file   Highest education level: Not on file  Occupational History   Occupation: [employer]    Employer: [employer]  Tobacco Use   Smoking status: Never    Passive exposure: Never   Smokeless tobacco: Never  Substance and Sexual Activity   Alcohol use:  No    Alcohol/week: 0.0 standard drinks of alcohol   Drug use: No   Sexual activity: Not on file  Other Topics Concern   Not on file  Social History Narrative   Not on file   Social Determinants of Health   Financial Resource Strain: Not on file  Food Insecurity: No Food Insecurity (10/26/2021)   Hunger Vital Sign    Worried About Running Out of Food in the Last Year: Never true    Ran Out of Food in the Last Year: Never true  Transportation Needs: No Transportation Needs (10/26/2021)   PRAPARE - Hydrologist (Medical): No    Lack of Transportation (Non-Medical): No  Physical Activity: Not on file  Stress: Not on file  Social Connections: Not on file    Allergies:  Allergies  Allergen  Reactions   Gentamicin Sulfate Other (See Comments)    Per patient "blisters" blisters    Skelaxin [Metaxalone] Hives and Rash   Tape Rash and Other (See Comments)    Per patient "blisters"     Metabolic Disorder Labs: No results found for: "HGBA1C", "MPG" No results found for: "PROLACTIN" Lab Results  Component Value Date   CHOL 215 (H) 03/11/2014   TRIG 288 (H) 03/11/2014   HDL 33 (L) 03/11/2014   CHOLHDL 6.5 03/11/2014   VLDL 58 (H) 03/11/2014   LDLCALC 124 (H) 03/11/2014   Lab Results  Component Value Date   TSH 0.04 (L) 01/03/2022    Therapeutic Level Labs: No results found for: "LITHIUM" No results found for: "VALPROATE" No results found for: "CBMZ"  Current Medications: Current Outpatient Medications  Medication Sig Dispense Refill   levothyroxine (SYNTHROID) 88 MCG tablet Take by mouth.     MAGNESIUM PO Take 1 tablet by mouth daily.     POTASSIUM PO Take 1 tablet by mouth daily.     predniSONE (DELTASONE) 5 MG tablet Take 1 tablet by mouth daily.     Sulfamethoxazole-Trimethoprim (BACTRIM PO) Take 1 tablet by mouth. Every Monday, Wednesday and Friday     tacrolimus (PROGRAF) 1 MG capsule Take 6 mg by mouth 2 (two) times daily.     ALPRAZolam (XANAX) 1 MG tablet Take 1 tablet (1 mg total) by mouth 2 (two) times daily as needed for anxiety. 60 tablet 2   buPROPion (WELLBUTRIN XL) 300 MG 24 hr tablet Take 1 tablet (300 mg total) by mouth every morning. 90 tablet 2   PARoxetine (PAXIL) 20 MG tablet Take 1 tablet (20 mg total) by mouth daily. 30 tablet 2   traZODone (DESYREL) 150 MG tablet Take 1 tablet (150 mg total) by mouth at bedtime. 30 tablet 2   No current facility-administered medications for this visit.     Musculoskeletal: Strength & Muscle Tone: within normal limits Gait & Station: normal Patient leans: N/A  Psychiatric Specialty Exam: Review of Systems  Neurological:  Positive for tremors.  All other systems reviewed and are negative.    Blood pressure (!) 187/77, pulse (!) 53, height 5' 11.5" (1.816 m), weight 158 lb (71.7 kg), SpO2 100 %.Body mass index is 21.73 kg/m.  General Appearance: Casual and Fairly Groomed  Eye Contact:  Good  Speech:  Clear and Coherent  Volume:  Normal  Mood:  Euthymic  Affect:  Appropriate and Congruent  Thought Process:  Goal Directed  Orientation:  Full (Time, Place, and Person)  Thought Content: Rumination   Suicidal Thoughts:  No  Homicidal Thoughts:  No  Memory:  Immediate;   Good Recent;   Good Remote;   Fair  Judgement:  Good  Insight:  Good  Psychomotor Activity:  Normal  Concentration:  Concentration: Fair and Attention Span: Fair  Recall:  Good  Fund of Knowledge: Good  Language: Good  Akathisia:  No  Handed:  Right  AIMS (if indicated): not done  Assets:  Communication Skills Desire for Improvement Resilience Social Support Talents/Skills  ADL's:  Intact  Cognition: WNL  Sleep:  Good   Screenings: GAD-7    Health and safety inspector from 12/18/2021 in [facility] from 11/22/2021 in [facility]  Total GAD-7 Score 11 16      PHQ2-9    [facility] Office Visit from 01/31/2022 in [facility] Office Visit from 01/03/2022 in [facility] Counselor from 12/18/2021 in [facility] Counsel[facility] from 11/22/2021 in [facility] Office Visit from 11/20/2021 in [facility]  PHQ-2 Total Score '2 4 2 6 6  '$ PHQ-9 Total Score '4 13 14 20 17      '$ [facility] Office Visit from 01/31/2022 in [facility] Office Visit from 01/03/2022 in [facility] Counselor from 11/22/2021 in [facility] No Risk No Risk No Risk        Assessment and Plan: This patient is a 61 year old female with a history of end-stage renal disease status post kidney transplant depression and anxiety as well as a history of hypothyroidism.  Her thyroid seems to be regulated better now and her most recent TSH was back in the normal range.  She is doing better in terms of her mood so we will continue Paxil 20 mg daily as well as Wellbutrin XL 300 mg daily for depression, Xanax 1 mg twice daily for anxiety and trazodone 150 mg at bedtime for sleep.  She will return to see me in 3 months  Collaboration of Care: Collaboration of Care: Referral or follow-up with counselor/therapist AEB patient will continue therapy with Maurice Small in our office  Patient/Guardian was advised Release of Information must be obtained prior to any record release in order to collaborate their care with an outside provider. Patient/Guardian was advised if they have not already done so to contact the registration department to sign all necessary forms in order for [place] to release information regarding their care.   Consent: Patient/Guardian gives verbal consent for treatment and assignment of benefits for services provided during this visit. Patient/Guardian expressed understanding and agreed to proceed.    [name], MD 01/31/2022, 2:18 PM

## 2022-02-06 ENCOUNTER — Ambulatory Visit (HOSPITAL_COMMUNITY): Payer: Medicare PPO | Admitting: Psychiatry

## 2022-02-06 ENCOUNTER — Encounter (HOSPITAL_COMMUNITY): Payer: Self-pay

## 2022-02-12 DIAGNOSIS — E785 Hyperlipidemia, unspecified: Secondary | ICD-10-CM | POA: Diagnosis not present

## 2022-02-12 DIAGNOSIS — D849 Immunodeficiency, unspecified: Secondary | ICD-10-CM | POA: Diagnosis not present

## 2022-02-12 DIAGNOSIS — B259 Cytomegaloviral disease, unspecified: Secondary | ICD-10-CM | POA: Diagnosis not present

## 2022-02-12 DIAGNOSIS — D649 Anemia, unspecified: Secondary | ICD-10-CM | POA: Diagnosis not present

## 2022-02-12 DIAGNOSIS — I10 Essential (primary) hypertension: Secondary | ICD-10-CM | POA: Diagnosis not present

## 2022-02-12 DIAGNOSIS — E890 Postprocedural hypothyroidism: Secondary | ICD-10-CM | POA: Diagnosis not present

## 2022-02-12 DIAGNOSIS — Z4822 Encounter for aftercare following kidney transplant: Secondary | ICD-10-CM | POA: Diagnosis not present

## 2022-02-12 DIAGNOSIS — L209 Atopic dermatitis, unspecified: Secondary | ICD-10-CM | POA: Diagnosis not present

## 2022-02-12 DIAGNOSIS — Z23 Encounter for immunization: Secondary | ICD-10-CM | POA: Diagnosis not present

## 2022-02-20 ENCOUNTER — Ambulatory Visit (HOSPITAL_COMMUNITY): Payer: Medicare PPO | Admitting: Psychiatry

## 2022-03-06 DIAGNOSIS — Z79899 Other long term (current) drug therapy: Secondary | ICD-10-CM | POA: Diagnosis not present

## 2022-03-06 DIAGNOSIS — Z940 Kidney transplant status: Secondary | ICD-10-CM | POA: Diagnosis not present

## 2022-03-12 DIAGNOSIS — L209 Atopic dermatitis, unspecified: Secondary | ICD-10-CM | POA: Diagnosis not present

## 2022-03-12 DIAGNOSIS — B258 Other cytomegaloviral diseases: Secondary | ICD-10-CM | POA: Diagnosis not present

## 2022-03-12 DIAGNOSIS — D649 Anemia, unspecified: Secondary | ICD-10-CM | POA: Diagnosis not present

## 2022-03-12 DIAGNOSIS — E876 Hypokalemia: Secondary | ICD-10-CM | POA: Diagnosis not present

## 2022-03-12 DIAGNOSIS — B259 Cytomegaloviral disease, unspecified: Secondary | ICD-10-CM | POA: Diagnosis not present

## 2022-03-12 DIAGNOSIS — I10 Essential (primary) hypertension: Secondary | ICD-10-CM | POA: Diagnosis not present

## 2022-03-12 DIAGNOSIS — D849 Immunodeficiency, unspecified: Secondary | ICD-10-CM | POA: Diagnosis not present

## 2022-03-12 DIAGNOSIS — I951 Orthostatic hypotension: Secondary | ICD-10-CM | POA: Diagnosis not present

## 2022-03-12 DIAGNOSIS — Z4822 Encounter for aftercare following kidney transplant: Secondary | ICD-10-CM | POA: Diagnosis not present

## 2022-03-12 DIAGNOSIS — K219 Gastro-esophageal reflux disease without esophagitis: Secondary | ICD-10-CM | POA: Diagnosis not present

## 2022-03-12 DIAGNOSIS — E890 Postprocedural hypothyroidism: Secondary | ICD-10-CM | POA: Diagnosis not present

## 2022-03-13 ENCOUNTER — Ambulatory Visit (INDEPENDENT_AMBULATORY_CARE_PROVIDER_SITE_OTHER): Payer: Medicare PPO | Admitting: Psychiatry

## 2022-03-13 DIAGNOSIS — F411 Generalized anxiety disorder: Secondary | ICD-10-CM | POA: Diagnosis not present

## 2022-03-13 DIAGNOSIS — F322 Major depressive disorder, single episode, severe without psychotic features: Secondary | ICD-10-CM

## 2022-03-13 NOTE — Progress Notes (Signed)
IN- PERSON  THERAPIST PROGRESS NOTE  Session Time: Tuesday  03/13/2022 3:07 PM - 3:58 PM       Participation Level: Active  Behavioral Response: CasualAlertAnxious and Depressed  Type of Therapy: Individual Therapy  Treatment Goals addressed: Reduce frequency and intensity of symptoms of anxiety AEB reducing worry episodes and irritability from 4 x per week to 1 x per week for 60 days per pt's self-report /Report a decrease in anxiety symptoms as evidenced by an overall reduction in anxiety score by a minimum of 25% on the Generalized Anxiety Disorder Scale   ProgressTowards Goals: not progressing   Interventions: CBT and Supportive  Summary: April Ward is a 60 y.o. female who is referred for services by psychiatrist Dr. Tenny Craw due to pt experiencing symptoms of anxiety and depression. She denies any psychiatric hospitalizations. She is a retuning pt to this clinician and last was seen in 2021. She continues to see psychiatrist Dr. Tenny Craw for mediction management. Pt. reports resuming therapy as she is tired of being in depressed.  Per patient's report, people have a tendency to make negative comments to her about her weight as she has lost a lot of weight.  Patient states this bothers her self-esteem.  She has been avoiding going around people.  She also reports grief and loss issues related to the death of her dog in 2021/08/11.  She reports financial stress due to trying to pay bills as well as by her medication.  Current symptoms include depressed mood, irritability, poor self-esteem, excessive worry, avoidant behaviors, and sleep difficulty.  Patient last was seen about 2 months ago.  She continues to experience symptoms of anxiety and depression.  She reports stress regarding recently receiving a letter from [employer] indicating she no longer qualifies for disability now that she has had a kidney transplant.  She reports initially experiencing panic but then began to experience numbness.   She reports still been able to experience some pleasure and happiness but is finding herself becoming more and more new to painful and distressful feelings.  She reports increased thoughts and memories regarding her deceased dog and her deceased husband.  Patient reports not really grieving over her husband who died 5 years ago as she has had to be strong for everyone else.  She reports continued pattern internalizing feelings.  Per her report, she is experiencing increased feelings of loneliness as the holidays approach.  Patient reports she has tried to journal but does not feel comfortable as her son wants to read her journal.  Suicidal/Homicidal: Nowithout intent/plan  Therapist Response: Reviewed symptoms, discussed stressors, facilitated expression of thoughts and feelings, validated feelings, facilitated patient sharing more thoughts and feelings about the relationship with her husband, assisted patient began to identify the effects of avoiding her feelings, validated and normalized feelings related to grief and loss, discussed integrated grief, praised and reinforced patient's efforts to journal, assured patient of her rights to privacy regarding her journaling, discussed coping with grief during the upcoming holidays  Plan: Return again in 2 weeks.  Diagnosis: Major depressive disorder, single episode, severe without psychotic features (HCC)  GAD (generalized anxiety disorder)  Collaboration of Care: Psychiatrist AEB patient working with psychiatrist Dr. Tenny Craw  Patient/Guardian was advised Release of Information must be obtained prior to any record release in order to collaborate their care with an outside provider. Patient/Guardian was advised if they have not already done so to contact the registration department to sign all necessary forms in order  for [place] to release information regarding their care.   Consent: Patient/Guardian gives verbal consent for treatment and assignment of benefits  for services provided during this visit. Patient/Guardian expressed understanding and agreed to proceed.   Adah Salvage, LCSW 03/13/2022

## 2022-03-15 DIAGNOSIS — R591 Generalized enlarged lymph nodes: Secondary | ICD-10-CM | POA: Diagnosis not present

## 2022-03-15 DIAGNOSIS — D649 Anemia, unspecified: Secondary | ICD-10-CM | POA: Diagnosis not present

## 2022-03-15 DIAGNOSIS — Z940 Kidney transplant status: Secondary | ICD-10-CM | POA: Diagnosis not present

## 2022-03-15 DIAGNOSIS — Z881 Allergy status to other antibiotic agents status: Secondary | ICD-10-CM | POA: Diagnosis not present

## 2022-03-15 DIAGNOSIS — Z888 Allergy status to other drugs, medicaments and biological substances status: Secondary | ICD-10-CM | POA: Diagnosis not present

## 2022-03-19 DIAGNOSIS — E569 Vitamin deficiency, unspecified: Secondary | ICD-10-CM | POA: Diagnosis not present

## 2022-04-06 DIAGNOSIS — Z79621 Long term (current) use of calcineurin inhibitor: Secondary | ICD-10-CM | POA: Diagnosis not present

## 2022-04-06 DIAGNOSIS — Z5181 Encounter for therapeutic drug level monitoring: Secondary | ICD-10-CM | POA: Diagnosis not present

## 2022-04-06 DIAGNOSIS — Z4822 Encounter for aftercare following kidney transplant: Secondary | ICD-10-CM | POA: Diagnosis not present

## 2022-04-11 ENCOUNTER — Ambulatory Visit (HOSPITAL_COMMUNITY): Payer: Medicare PPO | Admitting: Psychiatry

## 2022-04-23 DIAGNOSIS — I10 Essential (primary) hypertension: Secondary | ICD-10-CM | POA: Diagnosis not present

## 2022-04-23 DIAGNOSIS — E119 Type 2 diabetes mellitus without complications: Secondary | ICD-10-CM | POA: Diagnosis not present

## 2022-04-23 DIAGNOSIS — R112 Nausea with vomiting, unspecified: Secondary | ICD-10-CM | POA: Diagnosis not present

## 2022-04-23 DIAGNOSIS — D849 Immunodeficiency, unspecified: Secondary | ICD-10-CM | POA: Diagnosis not present

## 2022-04-23 DIAGNOSIS — Z940 Kidney transplant status: Secondary | ICD-10-CM | POA: Diagnosis not present

## 2022-04-23 DIAGNOSIS — D649 Anemia, unspecified: Secondary | ICD-10-CM | POA: Diagnosis not present

## 2022-04-23 DIAGNOSIS — I951 Orthostatic hypotension: Secondary | ICD-10-CM | POA: Diagnosis not present

## 2022-04-23 DIAGNOSIS — I120 Hypertensive chronic kidney disease with stage 5 chronic kidney disease or end stage renal disease: Secondary | ICD-10-CM | POA: Diagnosis not present

## 2022-04-23 DIAGNOSIS — E1122 Type 2 diabetes mellitus with diabetic chronic kidney disease: Secondary | ICD-10-CM | POA: Diagnosis not present

## 2022-04-23 DIAGNOSIS — N179 Acute kidney failure, unspecified: Secondary | ICD-10-CM | POA: Diagnosis not present

## 2022-04-23 DIAGNOSIS — R630 Anorexia: Secondary | ICD-10-CM | POA: Diagnosis not present

## 2022-04-23 DIAGNOSIS — R627 Adult failure to thrive: Secondary | ICD-10-CM | POA: Diagnosis not present

## 2022-04-23 DIAGNOSIS — N186 End stage renal disease: Secondary | ICD-10-CM | POA: Diagnosis not present

## 2022-04-23 DIAGNOSIS — M542 Cervicalgia: Secondary | ICD-10-CM | POA: Diagnosis not present

## 2022-04-25 ENCOUNTER — Ambulatory Visit (INDEPENDENT_AMBULATORY_CARE_PROVIDER_SITE_OTHER): Payer: Medicare PPO | Admitting: Psychiatry

## 2022-04-25 DIAGNOSIS — F322 Major depressive disorder, single episode, severe without psychotic features: Secondary | ICD-10-CM | POA: Diagnosis not present

## 2022-04-25 NOTE — Progress Notes (Signed)
IN- PERSON  THERAPIST PROGRESS NOTE  Session Time: Tuesday  04/25/2022 3:10 PM - 4:00 PM    Participation Level: Active  Behavioral Response: CasualAlertAnxious and Depressed  Type of Therapy: Individual Therapy  Treatment Goals addressed: Reduce frequency and intensity of symptoms of anxiety AEB reducing worry episodes and irritability from 4 x per week to 1 x per week for 60 days per pt's self-report /Report a decrease in anxiety symptoms as evidenced by an overall reduction in anxiety score by a minimum of 25% on the Generalized Anxiety Disorder Scale   ProgressTowards Goals: progressing   Interventions: CBT and Supportive  Summary: April Ward is a 62 y.o. female who is referred for services by psychiatrist Dr. Harrington Challenger due to pt experiencing symptoms of anxiety and depression. She denies any psychiatric hospitalizations. She is a retuning pt to this clinician and last was seen in 2021. She continues to see psychiatrist Dr. Harrington Challenger for mediction management. Pt. reports resuming therapy as she is tired of being in depressed.  Per patient's report, people have a tendency to make negative comments to her about her weight as she has lost a lot of weight.  Patient states this bothers her self-esteem.  She has been avoiding going around people.  She also reports grief and loss issues related to the death of her dog in 08/24/21.  She reports financial stress due to trying to pay bills as well as by her medication.  Current symptoms include depressed mood, irritability, poor self-esteem, excessive worry, avoidant behaviors, and sleep difficulty.               Patient last was seen about 6-7 weeks ago.  She continues to experience symptoms of anxiety and depression.  However, patient has improved her coping skills.  She reports increased grief and loss issues triggered not only by the holidays but also by the recent death of a close cousin.  She has been supportive to her cousin's widow and they have been  able to share experiences/feelings.  Patient reports experiencing self blame regarding husband's death and has thoughts of missing possible signs of his health condition.  She also expresses feelings of hurt husband did not inform her he was having health issues.  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  Patient reports stress related to the job due to the area manager who treats employees poorly and disrespectfully her patient's report.  She and the area manager have had conflict.  Patient reports experiencing elevated blood pressure due to the stress and is planning to resign.  She plans to look for another job that would be a better fit.  She also has filed an appeal regarding Social Security disability benefits.  Patient reports she has increased her involvement in church activities and events and reports enjoying this.  She has been socializing more with church members.  She also has been going to the gym.  Suicidal/Homicidal: Nowithout intent/plan  Therapist Response: Reviewed symptoms, facilitated patient expressing thoughts and feelings about the death of her cousin, validated and normalized feelings related to grief and loss, facilitated patient expressing thoughts and feelings regarding the death of her husband, assisted patient identify/challenge/and replace thoughts evoking inappropriate guilt and self blame with more rational statements, praised and reinforced patient's use of problem-solving skills regarding job, praised and reinforced patient's increased involvement in activity, discussed effects   Plan: Return again in  2 weeks.  Diagnosis: Major depressive disorder, single episode, severe without psychotic features ([place])  Collaboration of Care: Psychiatrist AEB patient working with psychiatrist Dr. Harrington Challenger  Patient/Guardian was advised Release of Information must be obtained prior to any record  release in order to collaborate their care with an outside provider. Patient/Guardian was advised if they have not already done so to contact the registration department to sign all necessary forms in order for [place] to release information regarding their care.   Consent: Patient/Guardian gives verbal consent for treatment and assignment of benefits for services provided during this visit. Patient/Guardian expressed understanding and agreed to proceed.   Alonza Smoker, LCSW 04/25/2022

## 2022-05-03 ENCOUNTER — Ambulatory Visit (HOSPITAL_COMMUNITY): Payer: Medicare PPO | Admitting: Psychiatry

## 2022-05-09 ENCOUNTER — Ambulatory Visit (HOSPITAL_COMMUNITY): Payer: Medicare PPO | Admitting: Psychiatry

## 2022-05-11 DIAGNOSIS — I10 Essential (primary) hypertension: Secondary | ICD-10-CM | POA: Diagnosis not present

## 2022-05-11 DIAGNOSIS — D649 Anemia, unspecified: Secondary | ICD-10-CM | POA: Diagnosis not present

## 2022-05-11 DIAGNOSIS — Z79621 Long term (current) use of calcineurin inhibitor: Secondary | ICD-10-CM | POA: Diagnosis not present

## 2022-05-11 DIAGNOSIS — Z79899 Other long term (current) drug therapy: Secondary | ICD-10-CM | POA: Diagnosis not present

## 2022-05-11 DIAGNOSIS — B259 Cytomegaloviral disease, unspecified: Secondary | ICD-10-CM | POA: Diagnosis not present

## 2022-05-11 DIAGNOSIS — Z4822 Encounter for aftercare following kidney transplant: Secondary | ICD-10-CM | POA: Diagnosis not present

## 2022-05-11 DIAGNOSIS — Z940 Kidney transplant status: Secondary | ICD-10-CM | POA: Diagnosis not present

## 2022-05-11 DIAGNOSIS — Z7952 Long term (current) use of systemic steroids: Secondary | ICD-10-CM | POA: Diagnosis not present

## 2022-05-11 DIAGNOSIS — Z792 Long term (current) use of antibiotics: Secondary | ICD-10-CM | POA: Diagnosis not present

## 2022-05-24 ENCOUNTER — Other Ambulatory Visit (HOSPITAL_COMMUNITY): Payer: Self-pay | Admitting: Psychiatry

## 2022-05-24 NOTE — Telephone Encounter (Signed)
Call for appt

## 2022-05-31 ENCOUNTER — Encounter: Payer: Self-pay | Admitting: Radiology

## 2022-06-11 ENCOUNTER — Ambulatory Visit (INDEPENDENT_AMBULATORY_CARE_PROVIDER_SITE_OTHER): Payer: Medicare PPO | Admitting: Psychiatry

## 2022-06-11 DIAGNOSIS — F411 Generalized anxiety disorder: Secondary | ICD-10-CM | POA: Diagnosis not present

## 2022-06-11 DIAGNOSIS — F322 Major depressive disorder, single episode, severe without psychotic features: Secondary | ICD-10-CM | POA: Diagnosis not present

## 2022-06-11 NOTE — Progress Notes (Signed)
IN- PERSON  THERAPIST PROGRESS NOTE  Session Time: Monday 06/11/2022 2:09 PM - 3:00 PM   Participation Level: Active  Behavioral Response: CasualAlertAnxious and Depressed  Type of Therapy: Individual Therapy  Treatment Goals addressed: Reduce frequency and intensity of symptoms of anxiety AEB reducing worry episodes and irritability from 4 x per week to 1 x per week for 60 days per pt's self-report /Report a decrease in anxiety symptoms as evidenced by an overall reduction in anxiety score by a minimum of 25% on the Generalized Anxiety Disorder Scale   ProgressTowards Goals: progressing   Interventions: CBT and Supportive  Summary: April Ward is a 61 y.o. female who is referred for services by psychiatrist Dr. Harrington Challenger due to pt experiencing symptoms of anxiety and depression. She denies any psychiatric hospitalizations. She is a retuning pt to this clinician and last was seen in 2021. She continues to see psychiatrist Dr. Harrington Challenger for mediction management. Pt. reports resuming therapy as she is tired of being in depressed.  Per patient's report, people have a tendency to make negative comments to her about her weight as she has lost a lot of weight.  Patient states this bothers her self-esteem.  She has been avoiding going around people.  She also reports grief and loss issues related to the death of her dog in 2021-08-30.  She reports financial stress due to trying to pay bills as well as by her medication.  Current symptoms include depressed mood, irritability, poor self-esteem, excessive worry, avoidant behaviors, and sleep difficulty.               Patient last was seen about 6-7 weeks ago.  She continues to experience symptoms of anxiety as reflected in the GAD-7.  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  However, patient has used assertiveness skills to set and maintain limits.  She also has been taking breaks when needed as well as  scheduling vacation time.  Patient still is looking for a another job.  Patient also continues to report grief and loss issues as there was another death in her family in 06/02/2022.  Patient states feeling as though she is numb to death.  Patient is continuing to stay involved in activities including attending church on the weekends.  She also mains very supportive to her grandchildren and is accompanying one of her granddaughters to a dance competition in [place] this weekend.    Suicidal/Homicidal: Nowithout intent/plan  Therapist Response: Reviewed symptoms, discussed stressors, facilitated expression of thoughts and feelings, validated feelings, praised and reinforced patient's efforts to use assertiveness skills/set-maintain limits, discussed effects, discussed rationale for practicing relaxation techniques regularly, developed plan with patient to practice aggressive muscle relaxation, checked out interactive audio activity to patient and provided with access code, discussed grief and loss issues and normalized feelings related to grief, discussed how avoidance feelings maintains grief, develoed plan with patient to resume journaling and provided patient with handouts to assist her in her efforts,  Plan: Return again in 2 weeks.  Diagnosis: Major depressive disorder, single episode, severe without psychotic features ([place])  GAD (generalized anxiety disorder)  Collaboration of Care: Psychiatrist AEB patient working with psychiatrist Dr. Harrington Challenger  Patient/Guardian was advised Release of Information must be obtained prior to any record release in order to collaborate their care with an outside provider. Patient/Guardian was advised if they have not already done so to contact the registration department to sign  all necessary forms in order for [place] to release information regarding their care.   Consent: Patient/Guardian gives verbal consent for treatment and assignment of benefits for services provided  during this visit. Patient/Guardian expressed understanding and agreed to proceed.   Alonza Smoker, LCSW 06/11/2022

## 2022-06-19 ENCOUNTER — Encounter (HOSPITAL_COMMUNITY): Payer: Self-pay | Admitting: Psychiatry

## 2022-06-19 ENCOUNTER — Telehealth (INDEPENDENT_AMBULATORY_CARE_PROVIDER_SITE_OTHER): Payer: Medicare PPO | Admitting: Psychiatry

## 2022-06-19 DIAGNOSIS — F322 Major depressive disorder, single episode, severe without psychotic features: Secondary | ICD-10-CM | POA: Diagnosis not present

## 2022-06-19 DIAGNOSIS — F411 Generalized anxiety disorder: Secondary | ICD-10-CM | POA: Diagnosis not present

## 2022-06-19 MED ORDER — ALPRAZOLAM 1 MG PO TABS: 1.0000 mg | ORAL_TABLET | Freq: Two times a day (BID) | ORAL | 2 refills | Status: DC | PRN

## 2022-06-19 MED ORDER — PAROXETINE HCL 20 MG PO TABS: 20.0000 mg | ORAL_TABLET | Freq: Every day | ORAL | 3 refills | Status: DC

## 2022-06-19 MED ORDER — BUPROPION HCL ER (XL) 300 MG PO TB24: 300.0000 mg | ORAL_TABLET | ORAL | 2 refills | Status: DC

## 2022-06-19 MED ORDER — TRAZODONE HCL 150 MG PO TABS: 150.0000 mg | ORAL_TABLET | Freq: Every day | ORAL | 3 refills | Status: DC

## 2022-06-19 NOTE — Progress Notes (Addendum)
Virtual Visit via Video Note  I connected with April Ward on 06/21/22 at 11:20 AM EDT by a video enabled telemedicine application and verified that I am speaking with the correct person using two identifiers.  Location: Patient: work Provider: office   I discussed the limitations of evaluation and management by telemedicine and the availability of in person appointments. The patient expressed understanding and agreed to proceed.    I discussed the assessment and treatment plan with the patient. The patient was provided an opportunity to ask questions and all were answered. The patient agreed with the plan and demonstrated an understanding of the instructions.   The patient was advised to call back or seek an in-person evaluation if the symptoms worsen or if the condition fails to improve as anticipated.  I provided 20 minutes of non-face-to-face time during this encounter.   Diannia Ruder, MD  [facility] MD/PA/NP OP Progress Note  06/19/2022 11:37 AM April Ward  MRN:  [id number]  Chief Complaint:  Chief Complaint  Patient presents with   Depression   Anxiety   Follow-up   HPI: This patient is a 62 year old widowed black female who lives with her son in [place].  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  The patient returns for follow-up after about 4 months.  Since I last saw her she found out that she lost her disability.  This is apparently because she had a successful renal transplant without complication.  She has had to go back to work because of finances.  She states that the area manager at her job has been very harsh with her and is constantly calling and demanding things to be done.  She states this is causing significant anxiety and her blood pressure has been very elevated.  The Xanax does help to some degree.  She thinks this manager is going to be leaving soon so she is trying to stick it out.  I told her because of her health she really  needs to consider moving to a different job and she is looking into this.  The patient states that she feels very well and up at night and I urged her to take the Xanax so that her mind can settle down.  She denies any thoughts of self-harm or suicide. Visit Diagnosis:    ICD-10-CM   1. Major depressive disorder, single episode, severe without psychotic features (HCC)  F32.2     2. GAD (generalized anxiety disorder)  F41.1       Past Psychiatric History: Long-term outpatient treatment  Past Medical History:  Past Medical History:  Diagnosis Date   Anxiety    Arthritis    Depression    Diabetes mellitus without complication (HCC)    GERD (gastroesophageal reflux disease)    Gout    Heart murmur    Hypertension    Renal cancer [facility])    Renal disease 10/2012   Renal insufficiency     Past Surgical History:  Procedure Laterality Date   CESAREAN SECTION  [id number]   CHONDROPLASTY Right 08/22/2012   Procedure: CHONDROPLASTY;  Surgeon: Vickki Hearing, MD;  Location: [facility];  Service: Orthopedics;  Laterality: Right;   COLONOSCOPY WITH PROPOFOL N/A 03/16/2014   Procedure: COLONOSCOPY WITH PROPOFOL (at cecum 1023, total withdrawal time=9 minutes);  Surgeon: West Bali, MD;  Location: [facility];  Service: Endoscopy;  Laterality: N/A;   GASTRIC BYPASS     KIDNEY TRANSPLANT  01/22/2021  KNEE ARTHROSCOPY WITH MEDIAL MENISECTOMY Right 08/22/2012   Procedure: KNEE ARTHROSCOPY WITH PARTIAL MEDIAL MENISECTOMY;  Surgeon: Vickki Hearing, MD;  Location: [facility];  Service: Orthopedics;  Laterality: Right;   PARTIAL NEPHRECTOMY  03/02/2013   left   TENDON REPAIR      Family Psychiatric History: See below  Family History:  Family History  Problem Relation Age of Onset   Heart attack Mother    Heart attack Father    Anxiety disorder Sister    Colon cancer Maternal Aunt    Depression Paternal Aunt    Alcohol abuse Maternal Uncle    Colon cancer Maternal Uncle    Depression Son     Anxiety disorder Son     Social History:  Social History   Socioeconomic History   Marital status: Widowed    Spouse name: Not on file   Number of children: Not on file   Years of education: Not on file   Highest education level: Not on file  Occupational History   Occupation: [employer]    Employer: [employer]  Tobacco Use   Smoking status: Never    Passive exposure: Never   Smokeless tobacco: Never  Substance and Sexual Activity   Alcohol use: No    Alcohol/week: 0.0 standard drinks of alcohol   Drug use: No   Sexual activity: Not on file  Other Topics Concern   Not on file  Social History Narrative   Not on file   Social Determinants of Health   Financial Resource Strain: Not on file  Food Insecurity: No Food Insecurity (10/26/2021)   Hunger Vital Sign    Worried About Running Out of Food in the Last Year: Never true    Ran Out of Food in the Last Year: Never true  Transportation Needs: No Transportation Needs (10/26/2021)   PRAPARE - Administrator, Civil Service (Medical): No    Lack of Transportation (Non-Medical): No  Physical Activity: Not on file  Stress: Not on file  Social Connections: Not on file    Allergies:  Allergies  Allergen Reactions   Gentamicin Sulfate Other (See Comments)    Per patient "blisters" blisters    Skelaxin [Metaxalone] Hives and Rash   Tape Rash and Other (See Comments)    Per patient "blisters"     Metabolic Disorder Labs: No results found for: "HGBA1C", "MPG" No results found for: "PROLACTIN" Lab Results  Component Value Date   CHOL 215 (H) 03/11/2014   TRIG 288 (H) 03/11/2014   HDL 33 (L) 03/11/2014   CHOLHDL 6.5 03/11/2014   VLDL 58 (H) 03/11/2014   LDLCALC 124 (H) 03/11/2014   Lab Results  Component Value Date   TSH 0.04 (L) 01/03/2022    Therapeutic Level Labs: No results found for: "LITHIUM" No results found for: "VALPROATE" No results found for: "CBMZ"  Current  Medications: Current Outpatient Medications  Medication Sig Dispense Refill   ALPRAZolam (XANAX) 1 MG tablet Take 1 tablet (1 mg total) by mouth 2 (two) times daily as needed for anxiety. 60 tablet 2   buPROPion (WELLBUTRIN XL) 300 MG 24 hr tablet Take 1 tablet (300 mg total) by mouth every morning. 90 tablet 2   levothyroxine (SYNTHROID) 88 MCG tablet Take by mouth.     MAGNESIUM PO Take 1 tablet by mouth daily.     PARoxetine (PAXIL) 20 MG tablet Take 1 tablet (20 mg total) by mouth daily. 90 tablet 3   POTASSIUM  PO Take 1 tablet by mouth daily.     predniSONE (DELTASONE) 5 MG tablet Take 1 tablet by mouth daily.     Sulfamethoxazole-Trimethoprim (BACTRIM PO) Take 1 tablet by mouth. Every Monday, Wednesday and Friday     traZODone (DESYREL) 150 MG tablet Take 1 tablet (150 mg total) by mouth at bedtime. 90 tablet 3   No current facility-administered medications for this visit.     Musculoskeletal: Strength & Muscle Tone: within normal limits Gait & Station: normal Patient leans: N/A  Psychiatric Specialty Exam: Review of Systems  Psychiatric/Behavioral:  Positive for sleep disturbance. The patient is nervous/anxious.   All other systems reviewed and are negative.   There were no vitals taken for this visit.There is no height or weight on file to calculate BMI.  General Appearance: Casual, Neat, and Well Groomed  Eye Contact:  Good  Speech:  Clear and Coherent  Volume:  Normal  Mood:  Anxious  Affect:  Congruent  Thought Process:  Goal Directed  Orientation:  Full (Time, Place, and Person)  Thought Content: Rumination   Suicidal Thoughts:  No  Homicidal Thoughts:  No  Memory:  Immediate;   Good Recent;   Good Remote;   Good  Judgement:  Good  Insight:  Good  Psychomotor Activity:  Normal  Concentration:  Concentration: Good and Attention Span: Good  Recall:  Good  Fund of Knowledge: Good  Language: Good  Akathisia:  No  Handed:  Right  AIMS (if indicated): not done   Assets:  Communication Skills Desire for Improvement Resilience Social Support Talents/Skills  ADL's:  Intact  Cognition: WNL  Sleep:  Fair   Screenings: GAD-7    Advertising copywriter from 06/11/2022 in [facility] [place] Counselor from 12/18/2021 in [facility] [place] Counselor from 11/22/2021 in [facility] [place]  Total GAD-7 Score 17 11 16       PHQ2-9    Flowsheet Row Video Visit from 06/19/2022 in [facility] [place] Office Visit from 01/31/2022 in [facility] [place] Office Visit from 01/03/2022 in [facility] [place] Counselor from 12/18/2021 in [facility] [place] Counselor from 11/22/2021 in [facility] Total Score 1 2 4 2 6   PHQ-9 Total Score -- 4 13 14 20       Flowsheet Row Video Visit from 06/19/2022 in [facility] at [place] Office Visit from 01/31/2022 in [facility] [place] Office Visit from 01/03/2022 in [facility] [place]  C-SSRS RISK CATEGORY No Risk No Risk No Risk        Assessment and Plan: This patient is a 62 year old female with a history of end-stage renal disease status post kidney transplant depression anxiety and hypothyroidism.  She has been very stressed since starting this new job because of the way the management has been treating her.  She likes the other aspects of the job but she is very anxious.  She is going to try to find something else.  For now she will continue Paxil 20 mg daily as well as Wellbutrin XL 300 mg daily for depression Xanax 1 mg twice daily for anxiety and trazodone 150 mg at bedtime for sleep.  She will return to see me in 3  months  Collaboration of Care: Collaboration of  Care: Referral or follow-up with counselor/therapist AEB patient will continue therapy with Florencia Reasons in our office  Patient/Guardian was advised Release of Information must be obtained prior to any record release in order to collaborate their care with an outside provider. Patient/Guardian was advised if they have not already done so to contact the registration department to sign all necessary forms in order for [place] to release information regarding their care.   Consent: Patient/Guardian gives verbal consent for treatment and assignment of benefits for services provided during this visit. Patient/Guardian expressed understanding and agreed to proceed.    Diannia Ruder, MD 06/19/2022, 11:37 AM

## 2022-06-21 ENCOUNTER — Encounter: Payer: Self-pay | Admitting: Podiatry

## 2022-06-21 ENCOUNTER — Ambulatory Visit (INDEPENDENT_AMBULATORY_CARE_PROVIDER_SITE_OTHER): Payer: Medicare PPO | Admitting: Podiatry

## 2022-06-21 DIAGNOSIS — M5030 Other cervical disc degeneration, unspecified cervical region: Secondary | ICD-10-CM | POA: Diagnosis not present

## 2022-06-21 DIAGNOSIS — D169 Benign neoplasm of bone and articular cartilage, unspecified: Secondary | ICD-10-CM

## 2022-06-21 DIAGNOSIS — L84 Corns and callosities: Secondary | ICD-10-CM | POA: Diagnosis not present

## 2022-06-21 DIAGNOSIS — M50321 Other cervical disc degeneration at C4-C5 level: Secondary | ICD-10-CM | POA: Diagnosis not present

## 2022-06-21 DIAGNOSIS — M2578 Osteophyte, vertebrae: Secondary | ICD-10-CM | POA: Diagnosis not present

## 2022-06-21 DIAGNOSIS — M542 Cervicalgia: Secondary | ICD-10-CM | POA: Diagnosis not present

## 2022-06-21 DIAGNOSIS — M47812 Spondylosis without myelopathy or radiculopathy, cervical region: Secondary | ICD-10-CM | POA: Diagnosis not present

## 2022-06-22 NOTE — Progress Notes (Signed)
Subjective:   Patient ID: April Ward, female   DOB: 62 y.o.   MRN: [id number]   HPI Patient has developed some white discoloration after using a acid pad on her right big toe for callus formation.  Concerned because of her diabetes   ROS      Objective:  Physical Exam  Neurovascular status found to be intact at the current time with quite a bit of tissue formation right big toe which appears to be more related to the acid that she applied with no proximal edema erythema drainage     Assessment:  Probable discoloration secondary to usage of material that is not good for her with her diabetes     Plan:  Reviewed condition and debrided the tissue did not note any drainage currently and I reviewed with her daily inspections of this and it should eventually normalize.  Patient had diabetic discussion will be seen back as needed for condition

## 2022-06-25 ENCOUNTER — Ambulatory Visit (INDEPENDENT_AMBULATORY_CARE_PROVIDER_SITE_OTHER): Payer: No Typology Code available for payment source | Admitting: Psychiatry

## 2022-06-25 DIAGNOSIS — F322 Major depressive disorder, single episode, severe without psychotic features: Secondary | ICD-10-CM

## 2022-06-25 NOTE — Progress Notes (Unsigned)
IN- PERSON  THERAPIST PROGRESS NOTE  Session Time: Monday 06/25/2022 2:07 PM -  Participation Level: Active  Behavioral Response: CasualAlertAnxious and Depressed  Type of Therapy: Individual Therapy  Treatment Goals addressed: Reduce frequency and intensity of symptoms of anxiety AEB reducing worry episodes and irritability from 4 x per week to 1 x per week for 60 days per pt's self-report /Report a decrease in anxiety symptoms as evidenced by an overall reduction in anxiety score by a minimum of 25% on the Generalized Anxiety Disorder Scale   ProgressTowards Goals: progressing   Interventions: CBT and Supportive  Summary: April Ward is a 62 y.o. female who is referred for services by psychiatrist Dr. Harrington Challenger due to pt experiencing symptoms of anxiety and depression. She denies any psychiatric hospitalizations. She is a retuning pt to this clinician and last was seen in 2021. She continues to see psychiatrist Dr. Harrington Challenger for mediction management. Pt. reports resuming therapy as she is tired of being in depressed.  Per patient's report, people have a tendency to make negative comments to her about her weight as she has lost a lot of weight.  Patient states this bothers her self-esteem.  She has been avoiding going around people.  She also reports grief and loss issues related to the death of her dog in September 05, 2021.  She reports financial stress due to trying to pay bills as well as by her medication.  Current symptoms include depressed mood, irritability, poor self-esteem, excessive worry, avoidant behaviors, and sleep difficulty.               Patient last was seen about 2-3 weeks ago.  She continues to experience symptoms of anxiety as reflected in the GAD-7.  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  However, patient has used assertiveness skills to set and maintain limits.  She also has been taking breaks when needed as well as  scheduling vacation time.  Patient still is looking for a another job.  Patient also continues to report grief and loss issues as there was another death in her family in 06-Jun-2022.  Patient states feeling as though she is numb to death.  Patient is continuing to stay involved in activities including attending church on the weekends.  She also mains very supportive to her grandchildren and is accompanying one of her granddaughters to a dance competition in [place] this weekend.    Suicidal/Homicidal: Nowithout intent/plan  Therapist Response: Reviewed symptoms, discussed stressors, facilitated expression of thoughts and feelings, validated feelings, praised and reinforced patient's efforts to use assertiveness skills/set-maintain limits, discussed effects, discussed rationale for practicing relaxation techniques regularly, developed plan with patient to practice aggressive muscle relaxation, checked out interactive audio activity to patient and provided with access code, discussed grief and loss issues and normalized feelings related to grief, discussed how avoidance feelings maintains grief, develoed plan with patient to resume journaling and provided patient with handouts to assist her in her efforts,  Plan: Return again in 2 weeks.  Diagnosis: Major depressive disorder, single episode, severe without psychotic features ([place])  Collaboration of Care: Psychiatrist AEB patient working with psychiatrist Dr. Harrington Challenger  Patient/Guardian was advised Release of Information must be obtained prior to any record release in order to collaborate their care with an outside provider. Patient/Guardian was advised if they have not already done so to contact the registration department to sign all necessary forms in order for [place] to  release information regarding their care.   Consent: Patient/Guardian gives verbal consent for treatment and assignment of benefits for services provided during this visit. Patient/Guardian  expressed understanding and agreed to proceed.   Alonza Smoker, LCSW 06/25/2022

## 2022-07-25 ENCOUNTER — Ambulatory Visit (HOSPITAL_COMMUNITY): Payer: Medicare PPO | Admitting: Psychiatry

## 2022-07-26 DIAGNOSIS — M4712 Other spondylosis with myelopathy, cervical region: Secondary | ICD-10-CM | POA: Diagnosis not present

## 2022-07-26 DIAGNOSIS — M4312 Spondylolisthesis, cervical region: Secondary | ICD-10-CM | POA: Diagnosis not present

## 2022-07-26 DIAGNOSIS — M5001 Cervical disc disorder with myelopathy,  high cervical region: Secondary | ICD-10-CM | POA: Diagnosis not present

## 2022-07-26 DIAGNOSIS — M4802 Spinal stenosis, cervical region: Secondary | ICD-10-CM | POA: Diagnosis not present

## 2022-07-27 DIAGNOSIS — D849 Immunodeficiency, unspecified: Secondary | ICD-10-CM | POA: Diagnosis not present

## 2022-08-08 ENCOUNTER — Encounter (HOSPITAL_COMMUNITY): Payer: Self-pay

## 2022-08-08 ENCOUNTER — Ambulatory Visit (HOSPITAL_COMMUNITY): Payer: Medicare PPO | Admitting: Psychiatry

## 2022-08-09 DIAGNOSIS — M5412 Radiculopathy, cervical region: Secondary | ICD-10-CM | POA: Diagnosis not present

## 2022-08-09 DIAGNOSIS — N289 Disorder of kidney and ureter, unspecified: Secondary | ICD-10-CM | POA: Diagnosis not present

## 2022-08-09 DIAGNOSIS — M5010 Cervical disc disorder with radiculopathy, unspecified cervical region: Secondary | ICD-10-CM | POA: Diagnosis not present

## 2022-08-09 DIAGNOSIS — M542 Cervicalgia: Secondary | ICD-10-CM | POA: Diagnosis not present

## 2022-08-10 DIAGNOSIS — N289 Disorder of kidney and ureter, unspecified: Secondary | ICD-10-CM | POA: Diagnosis not present

## 2022-08-10 DIAGNOSIS — D849 Immunodeficiency, unspecified: Secondary | ICD-10-CM | POA: Diagnosis not present

## 2022-08-10 DIAGNOSIS — E042 Nontoxic multinodular goiter: Secondary | ICD-10-CM | POA: Diagnosis not present

## 2022-08-13 ENCOUNTER — Other Ambulatory Visit (HOSPITAL_COMMUNITY): Payer: Self-pay | Admitting: Nephrology

## 2022-08-13 DIAGNOSIS — Z1231 Encounter for screening mammogram for malignant neoplasm of breast: Secondary | ICD-10-CM

## 2022-08-15 ENCOUNTER — Ambulatory Visit (HOSPITAL_COMMUNITY)
Admission: RE | Admit: 2022-08-15 | Discharge: 2022-08-15 | Disposition: A | Discharge status: Home / Self Care | Payer: Medicare PPO | Source: Ambulatory Visit | Attending: Nephrology | Admitting: Nephrology

## 2022-08-15 ENCOUNTER — Encounter (HOSPITAL_COMMUNITY): Payer: Self-pay

## 2022-08-15 DIAGNOSIS — Z1231 Encounter for screening mammogram for malignant neoplasm of breast: Secondary | ICD-10-CM | POA: Insufficient documentation

## 2022-08-20 DIAGNOSIS — E890 Postprocedural hypothyroidism: Secondary | ICD-10-CM | POA: Diagnosis not present

## 2022-08-20 DIAGNOSIS — C73 Malignant neoplasm of thyroid gland: Secondary | ICD-10-CM | POA: Diagnosis not present

## 2022-08-22 ENCOUNTER — Ambulatory Visit (HOSPITAL_COMMUNITY): Payer: No Typology Code available for payment source | Admitting: Psychiatry

## 2022-09-06 DIAGNOSIS — H6121 Impacted cerumen, right ear: Secondary | ICD-10-CM | POA: Diagnosis not present

## 2022-09-10 ENCOUNTER — Encounter (HOSPITAL_COMMUNITY): Payer: Self-pay | Admitting: Psychiatry

## 2022-09-10 ENCOUNTER — Ambulatory Visit (INDEPENDENT_AMBULATORY_CARE_PROVIDER_SITE_OTHER): Payer: Medicare PPO | Admitting: Psychiatry

## 2022-09-10 VITALS — BP 157/77 | HR 71 | Ht 71.5 in | Wt 179.6 lb

## 2022-09-10 DIAGNOSIS — F322 Major depressive disorder, single episode, severe without psychotic features: Secondary | ICD-10-CM

## 2022-09-10 DIAGNOSIS — F411 Generalized anxiety disorder: Secondary | ICD-10-CM

## 2022-09-10 MED ORDER — BUPROPION HCL ER (XL) 300 MG PO TB24: 300.0000 mg | ORAL_TABLET | ORAL | 2 refills | Status: DC

## 2022-09-10 MED ORDER — PAROXETINE HCL 20 MG PO TABS: 20.0000 mg | ORAL_TABLET | Freq: Every day | ORAL | 3 refills | Status: DC

## 2022-09-10 MED ORDER — QUETIAPINE FUMARATE 25 MG PO TABS: 25.0000 mg | ORAL_TABLET | Freq: Every day | ORAL | 2 refills | Status: DC

## 2022-09-10 MED ORDER — TRAZODONE HCL 150 MG PO TABS: 150.0000 mg | ORAL_TABLET | Freq: Every day | ORAL | 3 refills | Status: DC

## 2022-09-10 MED ORDER — ALPRAZOLAM 1 MG PO TABS: 1.0000 mg | ORAL_TABLET | Freq: Two times a day (BID) | ORAL | 2 refills | Status: DC | PRN

## 2022-09-10 NOTE — Progress Notes (Signed)
BH MD/PA/NP OP Progress Note  09/10/2022 2:54 PM April Ward  MRN:  [id number]  Chief Complaint:  Chief Complaint  Patient presents with   Depression   Anxiety   Follow-up   HPI: This patient is a 62 year old widowed black female lives with her son in [place].  She is currently unemployed.  The patient returns in person after 3 months.  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  She states that she quit in May because she could not stand his stress.  She was getting constant headaches neck aches her blood pressure was high and she was extremely anxious.  She still does not understand why she lost her disability after all she has been through with the renal transplant etc.  She is either going to reapply or try to get on Social Security.  In the interim she has been much more depressed and worried.  She is primarily worried about how she is going to support her cell.  Fortunately her son is helping her out.  She does not have a house payment.  She is having trouble sleeping and thinking a lot at night about her financial problems.  I strongly urged her to talk to someone at Social Security to see if she is eligible to get it now or to get her husband's benefits.  I also suggested we add a little bit of Seroquel back at bedtime to help with the racing thoughts and sleep.  She is not doing much through the day and I urged her to get back on of some sort of structured schedule.  She denies any thoughts of self-harm. Visit Diagnosis:    ICD-10-CM   1. Major depressive disorder, single episode, severe without psychotic features (HCC)  F32.2     2. GAD (generalized anxiety disorder)  F41.1       Past Psychiatric History: Long-term outpatient treatment  Past Medical History:  Past Medical History:  Diagnosis Date   Anxiety    Arthritis    Depression    Diabetes mellitus without complication (HCC)    GERD  (gastroesophageal reflux disease)    Gout    Heart murmur    Hypertension    Renal cancer [facility])    Renal disease 10/2012   Renal insufficiency     Past Surgical History:  Procedure Laterality Date   CESAREAN SECTION  [id number]   CHONDROPLASTY Right 08/22/2012   Procedure: CHONDROPLASTY;  Surgeon: Vickki Hearing, MD;  Location: [facility];  Service: Orthopedics;  Laterality: Right;   COLONOSCOPY WITH PROPOFOL N/A 03/16/2014   Procedure: COLONOSCOPY WITH PROPOFOL (at cecum 1023, total withdrawal time=9 minutes);  Surgeon: West Bali, MD;  Location: [facility];  Service: Endoscopy;  Laterality: N/A;   GASTRIC BYPASS     KIDNEY TRANSPLANT  01/22/2021   KNEE ARTHROSCOPY WITH MEDIAL MENISECTOMY Right 08/22/2012   Procedure: KNEE ARTHROSCOPY WITH PARTIAL MEDIAL MENISECTOMY;  Surgeon: Vickki Hearing, MD;  Location: [facility];  Service: Orthopedics;  Laterality: Right;   PARTIAL NEPHRECTOMY  03/02/2013   left   TENDON REPAIR      Family Psychiatric History: See below  Family History:  Family History  Problem Relation Age of Onset   Heart attack Mother    Heart attack Father    Anxiety disorder Sister    Colon cancer Maternal Aunt    Depression Paternal Aunt  Alcohol abuse Maternal Uncle    Colon cancer Maternal Uncle    Depression Son    Anxiety disorder Son     Social History:  Social History   Socioeconomic History   Marital status: Widowed    Spouse name: Not on file   Number of children: Not on file   Years of education: Not on file   Highest education level: Not on file  Occupational History   Occupation: [employer]    Employer: [employer]  Tobacco Use   Smoking status: Never    Passive exposure: Never   Smokeless tobacco: Never  Substance and Sexual Activity   Alcohol use: No    Alcohol/week: 0.0 standard drinks of alcohol   Drug use: No   Sexual activity: Not on file  Other Topics Concern   Not on file  Social History Narrative   Not  on file   Social Determinants of Health   Financial Resource Strain: Not on file  Food Insecurity: No Food Insecurity (10/26/2021)   Hunger Vital Sign    Worried About Running Out of Food in the Last Year: Never true    Ran Out of Food in the Last Year: Never true  Transportation Needs: No Transportation Needs (10/26/2021)   PRAPARE - Administrator, Civil Service (Medical): No    Lack of Transportation (Non-Medical): No  Physical Activity: Not on file  Stress: Not on file  Social Connections: Not on file    Allergies:  Allergies  Allergen Reactions   Gentamicin Sulfate Other (See Comments)    Per patient "blisters" blisters    Skelaxin [Metaxalone] Hives and Rash   Tape Rash and Other (See Comments)    Per patient "blisters"     Metabolic Disorder Labs: No results found for: "HGBA1C", "MPG" No results found for: "PROLACTIN" Lab Results  Component Value Date   CHOL 215 (H) 03/11/2014   TRIG 288 (H) 03/11/2014   HDL 33 (L) 03/11/2014   CHOLHDL 6.5 03/11/2014   VLDL 58 (H) 03/11/2014   LDLCALC 124 (H) 03/11/2014   Lab Results  Component Value Date   TSH 0.04 (L) 01/03/2022    Therapeutic Level Labs: No results found for: "LITHIUM" No results found for: "VALPROATE" No results found for: "CBMZ"  Current Medications: Current Outpatient Medications  Medication Sig Dispense Refill   QUEtiapine (SEROQUEL) 25 MG tablet Take 1 tablet (25 mg total) by mouth at bedtime. 30 tablet 2   ALPRAZolam (XANAX) 1 MG tablet Take 1 tablet (1 mg total) by mouth 2 (two) times daily as needed for anxiety. 60 tablet 2   buPROPion (WELLBUTRIN XL) 300 MG 24 hr tablet Take 1 tablet (300 mg total) by mouth every morning. 90 tablet 2   levothyroxine (SYNTHROID) 88 MCG tablet Take by mouth.     MAGNESIUM PO Take 1 tablet by mouth daily.     PARoxetine (PAXIL) 20 MG tablet Take 1 tablet (20 mg total) by mouth daily. 90 tablet 3   POTASSIUM PO Take 1 tablet by mouth daily.      predniSONE (DELTASONE) 5 MG tablet Take 1 tablet by mouth daily.     Sulfamethoxazole-Trimethoprim (BACTRIM PO) Take 1 tablet by mouth. Every Monday, Wednesday and Friday     traZODone (DESYREL) 150 MG tablet Take 1 tablet (150 mg total) by mouth at bedtime. 90 tablet 3   No current facility-administered medications for this visit.     Musculoskeletal: Strength & Muscle Tone: within  normal limits Gait & Station: normal Patient leans: N/A  Psychiatric Specialty Exam: Review of Systems  Musculoskeletal:  Positive for neck pain.  Psychiatric/Behavioral:  Positive for dysphoric mood and sleep disturbance. The patient is nervous/anxious.   All other systems reviewed and are negative.   Blood pressure (!) 157/77, pulse 71, height 5' 11.5" (1.816 m), weight 179 lb 9.6 oz (81.5 kg), SpO2 97 %.Body mass index is 24.7 kg/m.  General Appearance: Casual and Fairly Groomed  Eye Contact:  Good  Speech:  Clear and Coherent  Volume:  Decreased  Mood:  Anxious and Depressed  Affect:  Flat  Thought Process:  Goal Directed  Orientation:  Full (Time, Place, and Person)  Thought Content: Rumination   Suicidal Thoughts:  No  Homicidal Thoughts:  No  Memory:  Immediate;   Good Recent;   Fair Remote;   NA  Judgement:  Good  Insight:  Fair  Psychomotor Activity:  Decreased  Concentration:  Concentration: Poor and Attention Span: Poor  Recall:  Fair  Fund of Knowledge: Good  Language: Poor  Akathisia:  No  Handed:  Right  AIMS (if indicated): not done  Assets:  Communication Skills Desire for Improvement Resilience Social Support  ADL's:  Intact  Cognition: WNL  Sleep:  Poor   Screenings: GAD-7    Loss adjuster, chartered Office Visit from 09/10/2022 in [facility] [place] Counselor from 06/25/2022 in [facility] [place] Counselor from 06/11/2022 in [facility] at [place] Counselor from 12/18/2021 in  [facility] [place] Counselor from 11/22/2021 in [facility] at [place]  Total GAD-7 Score 17 20 17 11 16       PHQ2-9    Flowsheet Row Office Visit from 09/10/2022 in [facility] [place] Video Visit from 06/19/2022 in [facility] at [place] Office Visit from 01/31/2022 in [facility] at [place] Office Visit from 01/03/2022 in [facility] at [place] Counselor from 12/18/2021 in [facility] at [facility] Total Score 6 1 2 4 2   PHQ-9 Total Score 24 -- 4 13 14       Flowsheet Row Office Visit from 09/10/2022 in [facility] [place] Video Visit from 06/19/2022 in [facility] at [place] Office Visit from 01/31/2022 in [facility] at [place]  C-SSRS RISK CATEGORY No Risk No Risk No Risk        Assessment and Plan: This patient is a 62 year old female with a history of end-stage renal disease, status post kidney transplant, depression anxiety and hypothyroidism.  It was obviously a mistake to expect her to be able to function in her job after all of her health issues.  In my opinion she definitely would qualify for either disability or getting her Social Security benefits.  Because she is having the racing thoughts and insomnia we will reinstate Seroquel at 25 mg at bedtime.  She will continue Paxil 20 mg daily and Wellbutrin XL 300 mg daily for depression, Xanax 1 mg twice daily for anxiety and trazodone 150 mg at bedtime for sleep.  She will return to see me in 6 weeks  Collaboration of Care: Collaboration of Care: Referral or follow-up with counselor/therapist AEB patient will continue therapy with Florencia Reasons in our office  Patient/Guardian was advised Release of Information must  be  obtained prior to any record release in order to collaborate their care with an outside provider. Patient/Guardian was advised if they have not already done so to contact the registration department to sign all necessary forms in order for [place] to release information regarding their care.   Consent: Patient/Guardian gives verbal consent for treatment and assignment of benefits for services provided during this visit. Patient/Guardian expressed understanding and agreed to proceed.    Diannia Ruder, MD 09/10/2022, 2:54 PM

## 2022-09-11 ENCOUNTER — Ambulatory Visit (INDEPENDENT_AMBULATORY_CARE_PROVIDER_SITE_OTHER): Payer: Medicare PPO | Admitting: Psychiatry

## 2022-09-11 DIAGNOSIS — F411 Generalized anxiety disorder: Secondary | ICD-10-CM | POA: Diagnosis not present

## 2022-09-11 DIAGNOSIS — F322 Major depressive disorder, single episode, severe without psychotic features: Secondary | ICD-10-CM

## 2022-09-11 NOTE — Progress Notes (Signed)
IN- PERSON  THERAPIST PROGRESS NOTE  Session Time: Tuesday 09/11/2022 4:10 PM - 4:54 PM   Participation Level: Active  Behavioral Response: CasualAlertAnxious and Depressed/tearful at times  Type of Therapy: Individual Therapy  Treatment Goals addressed: Reduce frequency and intensity of symptoms of anxiety AEB reducing worry episodes and irritability from 4 x per week to 1 x per week for 60 days per pt's self-report /Report a decrease in anxiety symptoms as evidenced by an overall reduction in anxiety score by a minimum of 25% on the Generalized Anxiety Disorder Scale   ProgressTowards Goals: progressing   Interventions: CBT and Supportive  Summary: April Ward is a 62 y.o. female who is referred for services by psychiatrist Dr. Tenny Craw due to pt experiencing symptoms of anxiety and depression. She denies any psychiatric hospitalizations. She is a retuning pt to this clinician and last was seen in 2021. She continues to see psychiatrist Dr. Tenny Craw for mediction management. Pt. reports resuming therapy as she is tired of being in depressed.  Per patient's report, people have a tendency to make negative comments to her about her weight as she has lost a lot of weight.  Patient states this bothers her self-esteem.  She has been avoiding going around people.  She also reports grief and loss issues related to the death of her dog in 2021/09/11.  She reports financial stress due to trying to pay bills as well as by her medication.  Current symptoms include depressed mood, irritability, poor self-esteem, excessive worry, avoidant behaviors, and sleep difficulty.               Patient last was seen about 8-9 weeks ago.  She reports increased symptoms of anxiety and depression as reflected in the GAD-7 and PHQ-9.  Patient reports trigger was her job.  Patient reports quitting her job in Sep 12, 2022 due to stress.  She reports she began to experience elevated blood pressure and had to start on blood pressure medication.   She continues to experience diminished interest and pleasure in activities.  She reports normally enjoying working in the yard but just has had little to no interest in doing this.  She also reports she has not returned to the gym.  She reports worry about loss of income but has an appointment with Social Security administration tomorrow to see if she can apply for Social Security benefits.  She also is still in the appeal process regarding disability benefits.  Patient reports son is helping her financially to cover some of the expenses.    Suicidal/Homicidal: Nowithout intent/plan  Therapist Response: Reviewed symptoms, discussed stressors, facilitated expression of thoughts and feelings, validated feelings, praised and reinforced patient's efforts to follow through with appointment with Social Security administration, also discussed calling creditors to discuss possible deferment, discussed the role of self-care and behavioral activation in coping with depression, discussed rationale for and developed plan with patient to use daily planning to increase behavioral activation, provided patient with daily planning forms and activity menu to assist her in her efforts   Plan: Return again in 2 weeks.  Diagnosis: Major depressive disorder, single episode, severe without psychotic features (HCC)  GAD (generalized anxiety disorder)  Collaboration of Care: Psychiatrist AEB patient working with psychiatrist Dr. Tenny Craw  Patient/Guardian was advised Release of Information must be obtained prior to any record release in order to collaborate their care with an outside provider. Patient/Guardian was advised if they have not already done so to contact the registration department to  sign all necessary forms in order for [place] to release information regarding their care.   Consent: Patient/Guardian gives verbal consent for treatment and assignment of benefits for services provided during this visit. Patient/Guardian  expressed understanding and agreed to proceed.   Adah Salvage, LCSW 09/11/2022

## 2022-09-12 DIAGNOSIS — M5412 Radiculopathy, cervical region: Secondary | ICD-10-CM | POA: Diagnosis not present

## 2022-09-17 DIAGNOSIS — K429 Umbilical hernia without obstruction or gangrene: Secondary | ICD-10-CM | POA: Diagnosis not present

## 2022-09-25 ENCOUNTER — Telehealth (HOSPITAL_COMMUNITY): Payer: Self-pay | Admitting: Psychiatry

## 2022-09-25 ENCOUNTER — Ambulatory Visit (HOSPITAL_COMMUNITY): Payer: No Typology Code available for payment source | Admitting: Psychiatry

## 2022-09-25 ENCOUNTER — Encounter (HOSPITAL_COMMUNITY): Payer: Self-pay

## 2022-09-25 DIAGNOSIS — I10 Essential (primary) hypertension: Secondary | ICD-10-CM | POA: Diagnosis not present

## 2022-09-25 DIAGNOSIS — E1122 Type 2 diabetes mellitus with diabetic chronic kidney disease: Secondary | ICD-10-CM | POA: Diagnosis not present

## 2022-09-25 DIAGNOSIS — C73 Malignant neoplasm of thyroid gland: Secondary | ICD-10-CM | POA: Diagnosis not present

## 2022-09-25 DIAGNOSIS — E890 Postprocedural hypothyroidism: Secondary | ICD-10-CM | POA: Diagnosis not present

## 2022-09-25 DIAGNOSIS — Z949 Transplanted organ and tissue status, unspecified: Secondary | ICD-10-CM | POA: Diagnosis not present

## 2022-09-25 DIAGNOSIS — K429 Umbilical hernia without obstruction or gangrene: Secondary | ICD-10-CM | POA: Diagnosis not present

## 2022-09-25 DIAGNOSIS — R001 Bradycardia, unspecified: Secondary | ICD-10-CM | POA: Diagnosis not present

## 2022-09-25 DIAGNOSIS — D849 Immunodeficiency, unspecified: Secondary | ICD-10-CM | POA: Diagnosis not present

## 2022-09-25 DIAGNOSIS — E039 Hypothyroidism, unspecified: Secondary | ICD-10-CM | POA: Diagnosis not present

## 2022-09-25 DIAGNOSIS — F411 Generalized anxiety disorder: Secondary | ICD-10-CM | POA: Diagnosis not present

## 2022-09-25 NOTE — Telephone Encounter (Signed)
Therapist called patient regarding scheduled in office appointment.  Patient indicated she overslept and missed appointment.  Patient agreed to keep upcoming scheduled appointment on July 9.

## 2022-09-27 DIAGNOSIS — I10 Essential (primary) hypertension: Secondary | ICD-10-CM | POA: Diagnosis not present

## 2022-10-07 DIAGNOSIS — X58XXXA Exposure to other specified factors, initial encounter: Secondary | ICD-10-CM | POA: Diagnosis not present

## 2022-10-07 DIAGNOSIS — S93601A Unspecified sprain of right foot, initial encounter: Secondary | ICD-10-CM | POA: Diagnosis not present

## 2022-10-07 DIAGNOSIS — M25511 Pain in right shoulder: Secondary | ICD-10-CM | POA: Diagnosis not present

## 2022-10-09 ENCOUNTER — Ambulatory Visit (HOSPITAL_COMMUNITY): Payer: Medicare PPO | Admitting: Psychiatry

## 2022-10-09 ENCOUNTER — Telehealth (HOSPITAL_COMMUNITY): Payer: Self-pay | Admitting: Psychiatry

## 2022-10-09 DIAGNOSIS — I350 Nonrheumatic aortic (valve) stenosis: Secondary | ICD-10-CM | POA: Diagnosis not present

## 2022-10-09 DIAGNOSIS — Z85850 Personal history of malignant neoplasm of thyroid: Secondary | ICD-10-CM | POA: Diagnosis not present

## 2022-10-09 DIAGNOSIS — K429 Umbilical hernia without obstruction or gangrene: Secondary | ICD-10-CM | POA: Diagnosis not present

## 2022-10-09 DIAGNOSIS — F418 Other specified anxiety disorders: Secondary | ICD-10-CM | POA: Diagnosis not present

## 2022-10-09 DIAGNOSIS — N186 End stage renal disease: Secondary | ICD-10-CM | POA: Diagnosis not present

## 2022-10-09 DIAGNOSIS — E119 Type 2 diabetes mellitus without complications: Secondary | ICD-10-CM | POA: Diagnosis not present

## 2022-10-09 DIAGNOSIS — K219 Gastro-esophageal reflux disease without esophagitis: Secondary | ICD-10-CM | POA: Diagnosis not present

## 2022-10-09 DIAGNOSIS — Z01818 Encounter for other preprocedural examination: Secondary | ICD-10-CM | POA: Diagnosis not present

## 2022-10-09 DIAGNOSIS — I120 Hypertensive chronic kidney disease with stage 5 chronic kidney disease or end stage renal disease: Secondary | ICD-10-CM | POA: Diagnosis not present

## 2022-10-09 DIAGNOSIS — I4510 Unspecified right bundle-branch block: Secondary | ICD-10-CM | POA: Diagnosis not present

## 2022-10-09 NOTE — Telephone Encounter (Signed)
Therapist attempted to contact patient via phone regarding scheduled in office appointment and received voicemail recording.  Therapist left message indicating attempt and requesting patient call office. 

## 2022-10-10 ENCOUNTER — Ambulatory Visit (INDEPENDENT_AMBULATORY_CARE_PROVIDER_SITE_OTHER): Payer: Medicare PPO | Admitting: Psychiatry

## 2022-10-10 DIAGNOSIS — F322 Major depressive disorder, single episode, severe without psychotic features: Secondary | ICD-10-CM | POA: Diagnosis not present

## 2022-10-10 DIAGNOSIS — F411 Generalized anxiety disorder: Secondary | ICD-10-CM | POA: Diagnosis not present

## 2022-10-10 DIAGNOSIS — I452 Bifascicular block: Secondary | ICD-10-CM | POA: Diagnosis not present

## 2022-10-10 NOTE — Progress Notes (Unsigned)
IN- PERSON  THERAPIST PROGRESS NOTE  Session Time: Wednesday  7/10//2024 11:10 AM  Participation Level: Active  Behavioral Response: CasualAlertAnxious and Depressed/tearful at times  Type of Therapy: Individual Therapy  Treatment Goals addressed: Reduce frequency and intensity of symptoms of anxiety AEB reducing worry episodes and irritability from 4 x per week to 1 x per week for 60 days per pt's self-report /Report a decrease in anxiety symptoms as evidenced by an overall reduction in anxiety score by a minimum of 25% on the Generalized Anxiety Disorder Scale   ProgressTowards Goals: progressing   Interventions: CBT and Supportive  Summary: April Ward is a 62 y.o. female who is referred for services by psychiatrist Dr. Tenny Craw due to pt experiencing symptoms of anxiety and depression. She denies any psychiatric hospitalizations. She is a retuning pt to this clinician and last was seen in 2021. She continues to see psychiatrist Dr. Tenny Craw for mediction management. Pt. reports resuming therapy as she is tired of being in depressed.  Per patient's report, people have a tendency to make negative comments to her about her weight as she has lost a lot of weight.  Patient states this bothers her self-esteem.  She has been avoiding going around people.  She also reports grief and loss issues related to the death of her dog in 09/07/2021.  She reports financial stress due to trying to pay bills as well as by her medication.  Current symptoms include depressed mood, irritability, poor self-esteem, excessive worry, avoidant behaviors, and sleep difficulty.               Patient last was seen about 2-3 weekss ago.  She reports increased symptoms of anxiety and depression as reflected in the GAD-7 and PHQ-9.  Patient reports trigger was her job.  Patient reports quitting her job in 2022-09-08 due to stress.  She reports she began to experience elevated blood pressure and had to start on blood pressure medication.  She  continues to experience diminished interest and pleasure in activities.  She reports normally enjoying working in the yard but just has had little to no interest in doing this.  She also reports she has not returned to the gym.  She reports worry about loss of income but has an appointment with Social Security administration tomorrow to see if she can apply for Social Security benefits.  She also is still in the appeal process regarding disability benefits.  Patient reports son is helping her financially to cover some of the expenses.    Suicidal/Homicidal: Nowithout intent/plan  Therapist Response: Reviewed symptoms, discussed stressors, facilitated expression of thoughts and feelings, validated feelings, praised and reinforced patient's efforts to follow through with appointment with Social Security administration, also discussed calling creditors to discuss possible deferment, discussed the role of self-care and behavioral activation in coping with depression, discussed rationale for and developed plan with patient to use daily planning to increase behavioral activation, provided patient with daily planning forms and activity menu to assist her in her efforts   Plan: Return again in 2 weeks.  Diagnosis: Major depressive disorder, single episode, severe without psychotic features (HCC)  GAD (generalized anxiety disorder)  Collaboration of Care: Psychiatrist AEB patient working with psychiatrist Dr. Tenny Craw  Patient/Guardian was advised Release of Information must be obtained prior to any record release in order to collaborate their care with an outside provider. Patient/Guardian was advised if they have not already done so to contact the registration department to sign all necessary  forms in order for [place] to release information regarding their care.   Consent: Patient/Guardian gives verbal consent for treatment and assignment of benefits for services provided during this visit. Patient/Guardian  expressed understanding and agreed to proceed.   Adah Salvage, LCSW 10/10/2022

## 2022-10-12 DIAGNOSIS — N186 End stage renal disease: Secondary | ICD-10-CM | POA: Diagnosis not present

## 2022-10-12 DIAGNOSIS — K219 Gastro-esophageal reflux disease without esophagitis: Secondary | ICD-10-CM | POA: Diagnosis not present

## 2022-10-12 DIAGNOSIS — Z9884 Bariatric surgery status: Secondary | ICD-10-CM | POA: Diagnosis not present

## 2022-10-12 DIAGNOSIS — I120 Hypertensive chronic kidney disease with stage 5 chronic kidney disease or end stage renal disease: Secondary | ICD-10-CM | POA: Diagnosis not present

## 2022-10-12 DIAGNOSIS — Z940 Kidney transplant status: Secondary | ICD-10-CM | POA: Diagnosis not present

## 2022-10-12 DIAGNOSIS — E1122 Type 2 diabetes mellitus with diabetic chronic kidney disease: Secondary | ICD-10-CM | POA: Diagnosis not present

## 2022-10-12 DIAGNOSIS — Z85850 Personal history of malignant neoplasm of thyroid: Secondary | ICD-10-CM | POA: Diagnosis not present

## 2022-10-12 DIAGNOSIS — E039 Hypothyroidism, unspecified: Secondary | ICD-10-CM | POA: Diagnosis not present

## 2022-10-12 DIAGNOSIS — K429 Umbilical hernia without obstruction or gangrene: Secondary | ICD-10-CM | POA: Diagnosis not present

## 2022-10-16 DIAGNOSIS — I350 Nonrheumatic aortic (valve) stenosis: Secondary | ICD-10-CM | POA: Diagnosis not present

## 2022-10-16 DIAGNOSIS — I10 Essential (primary) hypertension: Secondary | ICD-10-CM | POA: Diagnosis not present

## 2022-10-16 DIAGNOSIS — N189 Chronic kidney disease, unspecified: Secondary | ICD-10-CM | POA: Diagnosis not present

## 2022-10-16 DIAGNOSIS — E1122 Type 2 diabetes mellitus with diabetic chronic kidney disease: Secondary | ICD-10-CM | POA: Diagnosis not present

## 2022-10-18 DIAGNOSIS — D849 Immunodeficiency, unspecified: Secondary | ICD-10-CM | POA: Diagnosis not present

## 2022-10-19 DIAGNOSIS — M5412 Radiculopathy, cervical region: Secondary | ICD-10-CM | POA: Diagnosis not present

## 2022-10-19 DIAGNOSIS — M5030 Other cervical disc degeneration, unspecified cervical region: Secondary | ICD-10-CM | POA: Diagnosis not present

## 2022-10-19 DIAGNOSIS — M5010 Cervical disc disorder with radiculopathy, unspecified cervical region: Secondary | ICD-10-CM | POA: Diagnosis not present

## 2022-10-19 DIAGNOSIS — M7918 Myalgia, other site: Secondary | ICD-10-CM | POA: Diagnosis not present

## 2022-10-22 ENCOUNTER — Encounter (HOSPITAL_COMMUNITY): Payer: Self-pay | Admitting: Psychiatry

## 2022-10-22 ENCOUNTER — Ambulatory Visit (INDEPENDENT_AMBULATORY_CARE_PROVIDER_SITE_OTHER): Payer: Medicare PPO | Admitting: Psychiatry

## 2022-10-22 VITALS — BP 148/83 | HR 62 | Ht 71.0 in | Wt 185.0 lb

## 2022-10-22 DIAGNOSIS — F322 Major depressive disorder, single episode, severe without psychotic features: Secondary | ICD-10-CM | POA: Diagnosis not present

## 2022-10-22 DIAGNOSIS — F411 Generalized anxiety disorder: Secondary | ICD-10-CM | POA: Diagnosis not present

## 2022-10-22 MED ORDER — BUPROPION HCL ER (XL) 300 MG PO TB24: 300.0000 mg | ORAL_TABLET | ORAL | 2 refills | Status: DC

## 2022-10-22 MED ORDER — PAROXETINE HCL 20 MG PO TABS: 20.0000 mg | ORAL_TABLET | Freq: Every day | ORAL | 3 refills | Status: DC

## 2022-10-22 MED ORDER — QUETIAPINE FUMARATE 25 MG PO TABS: 25.0000 mg | ORAL_TABLET | Freq: Every day | ORAL | 2 refills | Status: DC

## 2022-10-22 MED ORDER — ALPRAZOLAM 1 MG PO TABS: 1.0000 mg | ORAL_TABLET | Freq: Two times a day (BID) | ORAL | 2 refills | Status: DC | PRN

## 2022-10-22 MED ORDER — TRAZODONE HCL 150 MG PO TABS: 150.0000 mg | ORAL_TABLET | Freq: Every day | ORAL | 3 refills | Status: DC

## 2022-10-22 NOTE — Progress Notes (Signed)
BH MD/PA/NP OP Progress Note  10/22/2022 12:03 PM April Ward  MRN:  [id number]  Chief Complaint:  Chief Complaint  Patient presents with   Anxiety   Depression   Follow-up   HPI: This patient is a 62 year old widowed black female who lives with her son in [place].  She is currently unemployed.  The patient returns for follow-up after about 6 weeks.  She seems to be doing better since we added the Seroquel at night.  She is resting better and less angry and irritable.  She is still struggling to get her Social Security disability reinstated.  She is not able to work as it was strongly affecting her health.  She is still worried about the money situation but her son has been helping her to some degree.  Right now the patient denies significant depression or thoughts of self-harm or suicide.  Her anxiety is under good control with the Xanax.  She is sleeping and eating well and has regained some weight. Visit Diagnosis:    ICD-10-CM   1. Major depressive disorder, single episode, severe without psychotic features (HCC)  F32.2     2. GAD (generalized anxiety disorder)  F41.1       Past Psychiatric History: Long-term outpatient treatment  Past Medical History:  Past Medical History:  Diagnosis Date   Anxiety    Arthritis    Depression    Diabetes mellitus without complication (HCC)    GERD (gastroesophageal reflux disease)    Gout    Heart murmur    Hypertension    Renal cancer [facility])    Renal disease 10/2012   Renal insufficiency     Past Surgical History:  Procedure Laterality Date   CESAREAN SECTION  4098;1191   CHONDROPLASTY Right 08/22/2012   Procedure: CHONDROPLASTY;  Surgeon: Vickki Hearing, MD;  Location: [facility];  Service: Orthopedics;  Laterality: Right;   COLONOSCOPY WITH PROPOFOL N/A 03/16/2014   Procedure: COLONOSCOPY WITH PROPOFOL (at cecum 1023, total withdrawal time=9 minutes);  Surgeon: West Bali, MD;  Location: [facility];  Service: Endoscopy;   Laterality: N/A;   GASTRIC BYPASS     KIDNEY TRANSPLANT  01/22/2021   KNEE ARTHROSCOPY WITH MEDIAL MENISECTOMY Right 08/22/2012   Procedure: KNEE ARTHROSCOPY WITH PARTIAL MEDIAL MENISECTOMY;  Surgeon: Vickki Hearing, MD;  Location: [facility];  Service: Orthopedics;  Laterality: Right;   PARTIAL NEPHRECTOMY  03/02/2013   left   TENDON REPAIR      Family Psychiatric History: See below  Family History:  Family History  Problem Relation Age of Onset   Heart attack Mother    Heart attack Father    Anxiety disorder Sister    Colon cancer Maternal Aunt    Depression Paternal Aunt    Alcohol abuse Maternal Uncle    Colon cancer Maternal Uncle    Depression Son    Anxiety disorder Son     Social History:  Social History   Socioeconomic History   Marital status: Widowed    Spouse name: Not on file   Number of children: Not on file   Years of education: Not on file   Highest education level: Not on file  Occupational History   Occupation: [employer]    Employer: [employer]  Tobacco Use   Smoking status: Never    Passive exposure: Never   Smokeless tobacco: Never  Substance and Sexual Activity   Alcohol use: No    Alcohol/week: 0.0 standard drinks of  alcohol   Drug use: No   Sexual activity: Not on file  Other Topics Concern   Not on file  Social History Narrative   Not on file   Social Determinants of Health   Financial Resource Strain: Not on file  Food Insecurity: Low Risk  (08/10/2022)   [facility]   Food vital sign    Within the past 12 months, you worried that your food would run out before you got money to buy more: Never true    Within the past 12 months, the food you bought just didn't last and you didn't have money to get more. : Never true  Transportation Needs: No Transportation Needs (08/10/2022)   [facility]   Transportation    In the past 12 months, has lack of reliable  transportation kept you from medical appointments, meetings, work or from getting things needed for daily living? : No  Physical Activity: Not on file  Stress: No Stress Concern Present (03/03/2019)   Received from [facility], [facility] - Occupational Stress Questionnaire    Feeling of Stress : Not at all  Social Connections: Not on file    Allergies:  Allergies  Allergen Reactions   Gentamicin Sulfate Other (See Comments)    Per patient "blisters" blisters    Skelaxin [Metaxalone] Hives and Rash   Tape Rash and Other (See Comments)    Per patient "blisters"     Metabolic Disorder Labs: No results found for: "HGBA1C", "MPG" No results found for: "PROLACTIN" Lab Results  Component Value Date   CHOL 215 (H) 03/11/2014   TRIG 288 (H) 03/11/2014   HDL 33 (L) 03/11/2014   CHOLHDL 6.5 03/11/2014   VLDL 58 (H) 03/11/2014   LDLCALC 124 (H) 03/11/2014   Lab Results  Component Value Date   TSH 0.04 (L) 01/03/2022    Therapeutic Level Labs: No results found for: "LITHIUM" No results found for: "VALPROATE" No results found for: "CBMZ"  Current Medications: Current Outpatient Medications  Medication Sig Dispense Refill   levothyroxine (SYNTHROID) 88 MCG tablet Take by mouth.     MAGNESIUM PO Take 1 tablet by mouth daily.     POTASSIUM PO Take 1 tablet by mouth daily.     predniSONE (DELTASONE) 5 MG tablet Take 1 tablet by mouth daily.     Sulfamethoxazole-Trimethoprim (BACTRIM PO) Take 1 tablet by mouth. Every Monday, Wednesday and Friday     tacrolimus (PROGRAF) 1 MG capsule Take 1 mg by mouth 2 (two) times daily. 7 capsules     ALPRAZolam (XANAX) 1 MG tablet Take 1 tablet (1 mg total) by mouth 2 (two) times daily as needed for anxiety. 60 tablet 2   buPROPion (WELLBUTRIN XL) 300 MG 24 hr tablet Take 1 tablet (300 mg total) by mouth every morning. 90 tablet 2   PARoxetine (PAXIL) 20 MG tablet Take 1 tablet (20 mg total)  by mouth daily. 90 tablet 3   QUEtiapine (SEROQUEL) 25 MG tablet Take 1 tablet (25 mg total) by mouth at bedtime. 30 tablet 2   traZODone (DESYREL) 150 MG tablet Take 1 tablet (150 mg total) by mouth at bedtime. 90 tablet 3   No current facility-administered medications for this visit.     Musculoskeletal: Strength & Muscle Tone: within normal limits Gait & Station: normal Patient leans: N/A  Psychiatric Specialty Exam: Review of Systems  All other  systems reviewed and are negative.   Blood pressure (!) 148/83, pulse 62, height 5\' 11"  (1.803 m), weight 185 lb (83.9 kg), SpO2 100%.Body mass index is 25.8 kg/m.  General Appearance: Casual and Fairly Groomed  Eye Contact:  Good  Speech:  Clear and Coherent  Volume:  Normal  Mood:  Euthymic  Affect:  Congruent  Thought Process:  Goal Directed  Orientation:  Full (Time, Place, and Person)  Thought Content: WDL   Suicidal Thoughts:  No  Homicidal Thoughts:  No  Memory:  Immediate;   Good Recent;   Good Remote;   NA  Judgement:  Good  Insight:  Good  Psychomotor Activity:  Normal  Concentration:  Concentration: Good and Attention Span: Good  Recall:  Good  Fund of Knowledge: Good  Language: Good  Akathisia:  No  Handed:  Right  AIMS (if indicated): not done  Assets:  Communication Skills Desire for Improvement Resilience Social Support Talents/Skills  ADL's:  Intact  Cognition: WNL  Sleep:  Good   Screenings: GAD-7    Flowsheet Row Office Visit from 10/22/2022 in [facility] [place] Counselor from 10/10/2022 in [facility] at [place] Office Visit from 09/10/2022 in [facility] at [place] Counselor from 06/25/2022 in [facility] at [place] Counselor from 06/11/2022 in [facility] at [place]  Total GAD-7 Score 18 13 17 20 17       PHQ2-9    Flowsheet Row  Office Visit from 10/22/2022 in [facility] at [place] Office Visit from 09/10/2022 in [facility] [place] Video Visit from 06/19/2022 in [facility] at [place] Office Visit from 01/31/2022 in [facility] at [place] Office Visit from 01/03/2022 in [facility] at [facility] Total Score 5 6 1 2 4   PHQ-9 Total Score 20 24 -- 4 13      Flowsheet Row Office Visit from 09/10/2022 in [facility] at [place] Video Visit from 06/19/2022 in [facility] at [place] Office Visit from 01/31/2022 in [facility] at [place]  C-SSRS RISK CATEGORY No Risk No Risk No Risk        Assessment and Plan: This patient is a 62 year old female with a history of end-stage renal disease, status post kidney transplant, depression anxiety and hypothyroidism.  She is doing better with the addition of Seroquel 25 mg at bedtime for the racing thoughts and insomnia so this will be continued as well as Paxil 20 mg daily and Wellbutrin XL 300 mg daily for depression, Xanax 1 mg twice daily for anxiety and trazodone 150 mg at bedtime for sleep.  She will return to see me in 3 months  Collaboration of Care: Collaboration of Care: Referral or follow-up with counselor/therapist AEB patient will continue therapy with Florencia Reasons in our office  Patient/Guardian was advised Release of Information must be obtained prior to any record release in order to collaborate their care with an outside provider. Patient/Guardian was advised if they have not already done so to contact the registration department to sign all necessary forms in order for [place] to release information regarding their care.   Consent: Patient/Guardian gives verbal consent for treatment and assignment of benefits for services  provided during this visit. Patient/Guardian expressed understanding and agreed to proceed.    Diannia Ruder, MD  10/22/2022, 12:03 PM

## 2022-10-23 DIAGNOSIS — Z01419 Encounter for gynecological examination (general) (routine) without abnormal findings: Secondary | ICD-10-CM | POA: Diagnosis not present

## 2022-10-23 DIAGNOSIS — Z124 Encounter for screening for malignant neoplasm of cervix: Secondary | ICD-10-CM | POA: Diagnosis not present

## 2022-11-06 ENCOUNTER — Ambulatory Visit (HOSPITAL_COMMUNITY): Payer: Medicare PPO | Admitting: Psychiatry

## 2022-11-12 DIAGNOSIS — Z09 Encounter for follow-up examination after completed treatment for conditions other than malignant neoplasm: Secondary | ICD-10-CM | POA: Diagnosis not present

## 2022-11-12 DIAGNOSIS — Z8719 Personal history of other diseases of the digestive system: Secondary | ICD-10-CM | POA: Diagnosis not present

## 2022-11-12 DIAGNOSIS — Z9889 Other specified postprocedural states: Secondary | ICD-10-CM | POA: Diagnosis not present

## 2022-11-20 DIAGNOSIS — D849 Immunodeficiency, unspecified: Secondary | ICD-10-CM | POA: Diagnosis not present

## 2022-11-21 ENCOUNTER — Other Ambulatory Visit (HOSPITAL_COMMUNITY): Payer: Self-pay | Admitting: Psychiatry

## 2022-12-05 ENCOUNTER — Ambulatory Visit (HOSPITAL_COMMUNITY): Payer: Medicare PPO | Admitting: Psychiatry

## 2022-12-05 ENCOUNTER — Telehealth (HOSPITAL_COMMUNITY): Payer: Self-pay | Admitting: Psychiatry

## 2022-12-05 NOTE — Telephone Encounter (Signed)
Opened in error

## 2022-12-13 DIAGNOSIS — E890 Postprocedural hypothyroidism: Secondary | ICD-10-CM | POA: Diagnosis not present

## 2022-12-13 DIAGNOSIS — D849 Immunodeficiency, unspecified: Secondary | ICD-10-CM | POA: Diagnosis not present

## 2022-12-17 DIAGNOSIS — Z4822 Encounter for aftercare following kidney transplant: Secondary | ICD-10-CM | POA: Diagnosis not present

## 2022-12-18 DIAGNOSIS — F418 Other specified anxiety disorders: Secondary | ICD-10-CM | POA: Diagnosis not present

## 2022-12-18 DIAGNOSIS — Z9884 Bariatric surgery status: Secondary | ICD-10-CM | POA: Diagnosis not present

## 2022-12-18 DIAGNOSIS — E1122 Type 2 diabetes mellitus with diabetic chronic kidney disease: Secondary | ICD-10-CM | POA: Diagnosis not present

## 2022-12-18 DIAGNOSIS — I10 Essential (primary) hypertension: Secondary | ICD-10-CM | POA: Diagnosis not present

## 2022-12-18 DIAGNOSIS — Z8639 Personal history of other endocrine, nutritional and metabolic disease: Secondary | ICD-10-CM | POA: Diagnosis not present

## 2022-12-18 DIAGNOSIS — Z6826 Body mass index (BMI) 26.0-26.9, adult: Secondary | ICD-10-CM | POA: Diagnosis not present

## 2022-12-21 ENCOUNTER — Encounter: Payer: Self-pay | Admitting: Podiatry

## 2022-12-21 ENCOUNTER — Ambulatory Visit: Payer: Medicare PPO | Admitting: Podiatry

## 2022-12-21 DIAGNOSIS — D169 Benign neoplasm of bone and articular cartilage, unspecified: Secondary | ICD-10-CM

## 2022-12-21 DIAGNOSIS — L84 Corns and callosities: Secondary | ICD-10-CM | POA: Diagnosis not present

## 2022-12-21 DIAGNOSIS — M21619 Bunion of unspecified foot: Secondary | ICD-10-CM

## 2022-12-24 NOTE — Progress Notes (Signed)
Subjective:   Patient ID: April Ward, female   DOB: 62 y.o.   MRN: [id number]   HPI Patient presents with several problems with 1 being severe lesion formation bilateral hallux medial side painful when pressed structural bunion bone spur formation and overall foot instability   ROS      Objective:  Physical Exam  Neurovascular status intact muscle strength is adequate range of motion adequate with chronic keratotic lesion chronic digital deformity with rotation of the hallux and chronic moderate bunion deformity     Assessment:  Structural deformities which are leading to callus formation secondary to foot structure     Plan:  H&P reviewed we could consider some form of surgical procedure but at this point continue debridement to be accomplished and patient will be seen back on an as needed basis may require more aggressive treatment at 1 point in future

## 2022-12-26 DIAGNOSIS — M109 Gout, unspecified: Secondary | ICD-10-CM | POA: Diagnosis not present

## 2023-01-15 ENCOUNTER — Ambulatory Visit: Payer: Medicare PPO | Attending: Nurse Practitioner | Admitting: Nurse Practitioner

## 2023-01-15 ENCOUNTER — Ambulatory Visit (INDEPENDENT_AMBULATORY_CARE_PROVIDER_SITE_OTHER): Payer: Medicare PPO | Admitting: Psychiatry

## 2023-01-15 ENCOUNTER — Encounter (HOSPITAL_COMMUNITY): Payer: Self-pay | Admitting: Psychiatry

## 2023-01-15 VITALS — BP 152/78 | Ht 71.0 in | Wt 193.4 lb

## 2023-01-15 DIAGNOSIS — F411 Generalized anxiety disorder: Secondary | ICD-10-CM | POA: Diagnosis not present

## 2023-01-15 DIAGNOSIS — D849 Immunodeficiency, unspecified: Secondary | ICD-10-CM | POA: Diagnosis not present

## 2023-01-15 DIAGNOSIS — F322 Major depressive disorder, single episode, severe without psychotic features: Secondary | ICD-10-CM | POA: Diagnosis not present

## 2023-01-15 MED ORDER — BUPROPION HCL ER (XL) 300 MG PO TB24: 300.0000 mg | ORAL_TABLET | ORAL | 2 refills | Status: DC

## 2023-01-15 MED ORDER — QUETIAPINE FUMARATE 25 MG PO TABS: 25.0000 mg | ORAL_TABLET | Freq: Every day | ORAL | 3 refills | Status: DC

## 2023-01-15 MED ORDER — ALPRAZOLAM 1 MG PO TABS: 1.0000 mg | ORAL_TABLET | Freq: Two times a day (BID) | ORAL | 2 refills | Status: DC | PRN

## 2023-01-15 MED ORDER — TRAZODONE HCL 150 MG PO TABS: 150.0000 mg | ORAL_TABLET | Freq: Every day | ORAL | 3 refills | Status: DC

## 2023-01-15 MED ORDER — PAROXETINE HCL 20 MG PO TABS: 20.0000 mg | ORAL_TABLET | Freq: Every day | ORAL | 3 refills | Status: DC

## 2023-01-15 NOTE — Progress Notes (Deleted)
Cardiology Office Note:  .   Date:  01/15/2023  ID:  April Ward, DOB 1961-01-28, MRN [id number] PCP: April Mires, MD  [facility] Providers Cardiologist:  April Rich, MD { Click to update primary MD,subspecialty MD or APP then REFRESH:1}   History of Present Illness: .   April Ward is a 62 y.o. female with a PMH of aortic valve stenosis, history of ESRD and was formally on peritoneal dialysis, s/p renal transplant, hypertension, history of renal cell cancer, s/p left partial nephrectomy in 2014, anxiety, depression, type 2 diabetes, GERD, who presents today for 1 year follow-up.  Last seen by Dr. Dina Ward on December 26, 2021.  She was overall doing well at the time.  Dr. Wyline Ward recommended repeating echocardiogram in 2025.  And deferring blood pressure agents to transplant team given her history of kidney disease and immunosuppressive medication interactions.  Today she presents for 1 year follow-up.  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 ROS: ***  Studies Reviewed: .        *** Risk Assessment/Calculations:   {Does this patient have ATRIAL FIBRILLATION?:[phone]}         Physical Exam:   VS:  There were no vitals taken for this visit.   Wt Readings from Last 3 Encounters:  12/26/21 147 lb (66.7 kg)  12/08/19 214 lb 3.2 oz (97.2 kg)  04/10/19 198 lb (89.8 kg)    GEN: Well nourished, well developed in no acute distress NECK: No JVD; No carotid bruits CARDIAC: ***RRR, no murmurs, rubs, gallops RESPIRATORY:  Clear to auscultation without rales, wheezing or rhonchi  ABDOMEN: Soft, non-tender, non-distended EXTREMITIES:  No edema; No deformity   ASSESSMENT AND PLAN: .   ***    {Are you ordering a CV Procedure (e.g. stress test, cath, DCCV, TEE, etc)?   Press F2        :[id number]}  Dispo: ***  Signed, April Dory, NP

## 2023-01-15 NOTE — Progress Notes (Signed)
BH MD/PA/NP OP Progress Note  01/15/2023 11:28 AM April Ward  MRN:  [id number]  Chief Complaint:  Chief Complaint  Patient presents with   Depression   Anxiety   Follow-up   HPI: This patient is a 62 year old widowed black female who lives with her son in [place].  She is currently unemployed.  The patient returns for follow-up after 3 months.  For the most part she is doing okay but she is very anxious and worried about her finances.  She still has not gotten her Social Security disability reinstated.  She supposed to have a phone call meeting with them tomorrow.  She does not know what she will do if she does not get this.  She is also trying to get services through DSS.  She is obviously not able to work given all of her health issues.  Her kids are helping her financially but they cannot do this forever.  The patient would like to get another dog to replace the 1 that died.  It was good company for her and kept her more active.  Her kids are against this but I reminded her that it was her own decision.  She denies significant depression or thoughts of self-harm.  She had some recent lab work because her thyroid function has been really off.  Her Synthroid was recently increased.  Her vitamin D is also low as is calcium.  She has an appointment with endocrinology coming up this week.  She has regained some of the weight that she lost. Visit Diagnosis:    ICD-10-CM   1. Major depressive disorder, single episode, severe without psychotic features (HCC)  F32.2     2. GAD (generalized anxiety disorder)  F41.1       Past Psychiatric History: Long-term outpatient treatment  Past Medical History:  Past Medical History:  Diagnosis Date   Anxiety    Arthritis    Depression    Diabetes mellitus without complication (HCC)    GERD (gastroesophageal reflux disease)    Gout    Heart murmur    Hypertension    Renal cancer [facility])    Renal disease 10/2012   Renal insufficiency      Past Surgical History:  Procedure Laterality Date   CESAREAN SECTION  4098;1191   CHONDROPLASTY Right 08/22/2012   Procedure: CHONDROPLASTY;  Surgeon: Vickki Hearing, MD;  [facility];  Service: Orthopedics;  Laterality: Right;   COLONOSCOPY WITH PROPOFOL N/A 03/16/2014   Procedure: COLONOSCOPY WITH PROPOFOL (at cecum 1023, total withdrawal time=9 minutes);  Surgeon: West Bali, MD;  Location: [facility];  Service: Endoscopy;  Laterality: N/A;   GASTRIC BYPASS     KIDNEY TRANSPLANT  01/22/2021   KNEE ARTHROSCOPY WITH MEDIAL MENISECTOMY Right 08/22/2012   Procedure: KNEE ARTHROSCOPY WITH PARTIAL MEDIAL MENISECTOMY;  Surgeon: Vickki Hearing, MD;  Location: [facility];  Service: Orthopedics;  Laterality: Right;   PARTIAL NEPHRECTOMY  03/02/2013   left   TENDON REPAIR      Family Psychiatric History: See below  Family History:  Family History  Problem Relation Age of Onset   Heart attack Mother    Heart attack Father    Anxiety disorder Sister    Colon cancer Maternal Aunt    Depression Paternal Aunt    Alcohol abuse Maternal Uncle    Colon cancer Maternal Uncle    Depression Son    Anxiety disorder Son     Social History:  Social  History   Socioeconomic History   Marital status: Widowed    Spouse name: Not on file   Number of children: Not on file   Years of education: Not on file   Highest education level: Not on file  Occupational History   Occupation: [employer]    Employer: [employer]  Tobacco Use   Smoking status: Never    Passive exposure: Never   Smokeless tobacco: Never  Substance and Sexual Activity   Alcohol use: No    Alcohol/week: 0.0 standard drinks of alcohol   Drug use: No   Sexual activity: Not on file  Other Topics Concern   Not on file  Social History Narrative   Not on file   Social Determinants of Health   Financial Resource Strain: Not on file  Food Insecurity: Low Risk  (08/10/2022)   [facility]   Hunger Vital Sign    Worried About Running Out of Food in the Last Year: Never true    Ran Out of Food in the Last Year: Never true  Transportation Needs: No Transportation Needs (08/10/2022)   [facility]   Transportation    In the past 12 months, has lack of reliable transportation kept you from medical appointments, meetings, work or from getting things needed for daily living? : No  Physical Activity: Not on file  Stress: No Stress Concern Present (03/03/2019)   Received from [facility], [facility] of Occupational Health - Occupational Stress Questionnaire    Feeling of Stress : Not at all  Social Connections: Not on file    Allergies:  Allergies  Allergen Reactions   Gentamicin Sulfate Other (See Comments)    Per patient "blisters" blisters    Skelaxin [Metaxalone] Hives and Rash   Tape Rash and Other (See Comments)    Per patient "blisters"     Metabolic Disorder Labs: No results found for: "HGBA1C", "MPG" No results found for: "PROLACTIN" Lab Results  Component Value Date   CHOL 215 (H) 03/11/2014   TRIG 288 (H) 03/11/2014   HDL 33 (L) 03/11/2014   CHOLHDL 6.5 03/11/2014   VLDL 58 (H) 03/11/2014   LDLCALC 124 (H) 03/11/2014   Lab Results  Component Value Date   TSH 0.04 (L) 01/03/2022    Therapeutic Level Labs: No results found for: "LITHIUM" No results found for: "VALPROATE" No results found for: "CBMZ"  Current Medications: Current Outpatient Medications  Medication Sig Dispense Refill   levothyroxine (SYNTHROID) 175 MCG tablet Take 175 mcg by mouth daily before breakfast.     MAGNESIUM PO Take 1 tablet by mouth daily.     POTASSIUM PO Take 1 tablet by mouth daily.     predniSONE (DELTASONE) 5 MG tablet Take 1 tablet by mouth daily.     Sulfamethoxazole-Trimethoprim (BACTRIM PO) Take 1 tablet by mouth. Every Monday, Wednesday and Friday     tacrolimus (PROGRAF) 1 MG  capsule Take 1 mg by mouth 2 (two) times daily. 7 capsules     ALPRAZolam (XANAX) 1 MG tablet Take 1 tablet (1 mg total) by mouth 2 (two) times daily as needed for anxiety. 60 tablet 2   buPROPion (WELLBUTRIN XL) 300 MG 24 hr tablet Take 1 tablet (300 mg total) by mouth every morning. 90 tablet 2   PARoxetine (PAXIL) 20 MG tablet Take 1 tablet (20 mg total) by mouth daily. 90 tablet 3   QUEtiapine (  SEROQUEL) 25 MG tablet Take 1 tablet (25 mg total) by mouth at bedtime. 90 tablet 3   traZODone (DESYREL) 150 MG tablet Take 1 tablet (150 mg total) by mouth at bedtime. 90 tablet 3   No current facility-administered medications for this visit.     Musculoskeletal: Strength & Muscle Tone: within normal limits Gait & Station: normal Patient leans: N/A  Psychiatric Specialty Exam: Review of Systems  Psychiatric/Behavioral:  Positive for decreased concentration and dysphoric mood. The patient is nervous/anxious.   All other systems reviewed and are negative.   Blood pressure (!) 152/78, height 5\' 11"  (1.803 m), weight 193 lb 6.4 oz (87.7 kg).Body mass index is 26.97 kg/m.  General Appearance: Casual and Fairly Groomed  Eye Contact:  Good  Speech:  Clear and Coherent  Volume:  Normal  Mood:  Anxious  Affect:  Congruent  Thought Process:  Goal Directed  Orientation:  Full (Time, Place, and Person)  Thought Content: Rumination   Suicidal Thoughts:  No  Homicidal Thoughts:  No  Memory:  Immediate;   Good Recent;   Good Remote;   Fair  Judgement:  Good  Insight:  Good  Psychomotor Activity:  Decreased  Concentration:  Concentration: Fair and Attention Span: Fair  Recall:  Good  Fund of Knowledge: Good  Language: Good  Akathisia:  No  Handed:  Right  AIMS (if indicated): not done  Assets:  Communication Skills Desire for Improvement Resilience Social Support  ADL's:  Intact  Cognition: WNL  Sleep:  Good   Screenings: GAD-7    Flowsheet Row Office Visit from 01/15/2023 in  [facility] at [place] Office Visit from 10/22/2022 in [facility] at [place] Counselor from 10/10/2022 in [facility] at [place] Office Visit from 09/10/2022 in [facility] at [place] Counselor from 06/25/2022 in [facility] at [place]  Total GAD-7 Score 16 18 13 17 20       PHQ2-9    Flowsheet Row Office Visit from 01/15/2023 in [facility] at [place] Office Visit from 10/22/2022 in [facility] at [place] Office Visit from 09/10/2022 in [facility] at [place] Video Visit from 06/19/2022 in [facility] at [place] Office Visit from 01/31/2022 in [facility] at [facility] Total Score 3 5 6 1 2   PHQ-9 Total Score 16 20 24  -- 4      Flowsheet Row Office Visit from 09/10/2022 in [facility] at [place] Video Visit from 06/19/2022 in [facility] at [place] Office Visit from 01/31/2022 in [facility] at [place]  C-SSRS RISK CATEGORY No Risk No Risk No Risk        Assessment and Plan: This patient is a 62 year old female with a history of end-stage renal disease, status post kidney transplant, depression anxiety and hypothyroidism.  She is very stressed regarding the disability but does still think her medications are helping with the depression and anxiety.  She will continue Paxil 20 mg daily and Wellbutrin XL 300 mg daily for depression, Seroquel 25 mg at bedtime for racing thoughts and insomnia, trazodone 150 mg at bedtime for sleep and Xanax 1 mg twice daily as needed for anxiety.  She will return to see me in 3 months  Collaboration of Care: Collaboration of Care: Referral or follow-up with  counselor/therapist AEB  patient will continue therapy with Florencia Reasons in our office  Patient/Guardian was advised Release of Information must be obtained prior to any record release in order to collaborate their care with an outside provider. Patient/Guardian was advised if they have not already done so to contact the registration department to sign all necessary forms in order for [place] to release information regarding their care.   Consent: Patient/Guardian gives verbal consent for treatment and assignment of benefits for services provided during this visit. Patient/Guardian expressed understanding and agreed to proceed.    Diannia Ruder, MD 01/15/2023, 11:28 AM

## 2023-01-22 DIAGNOSIS — M25512 Pain in left shoulder: Secondary | ICD-10-CM | POA: Diagnosis not present

## 2023-01-22 DIAGNOSIS — M25511 Pain in right shoulder: Secondary | ICD-10-CM | POA: Diagnosis not present

## 2023-01-23 DIAGNOSIS — M19012 Primary osteoarthritis, left shoulder: Secondary | ICD-10-CM | POA: Diagnosis not present

## 2023-01-23 DIAGNOSIS — M19011 Primary osteoarthritis, right shoulder: Secondary | ICD-10-CM | POA: Diagnosis not present

## 2023-02-05 DIAGNOSIS — H53001 Unspecified amblyopia, right eye: Secondary | ICD-10-CM | POA: Diagnosis not present

## 2023-02-05 DIAGNOSIS — D849 Immunodeficiency, unspecified: Secondary | ICD-10-CM | POA: Diagnosis not present

## 2023-02-05 DIAGNOSIS — H4423 Degenerative myopia, bilateral: Secondary | ICD-10-CM | POA: Diagnosis not present

## 2023-02-05 DIAGNOSIS — H40013 Open angle with borderline findings, low risk, bilateral: Secondary | ICD-10-CM | POA: Diagnosis not present

## 2023-02-13 DIAGNOSIS — M5412 Radiculopathy, cervical region: Secondary | ICD-10-CM | POA: Diagnosis not present

## 2023-03-04 DIAGNOSIS — D849 Immunodeficiency, unspecified: Secondary | ICD-10-CM | POA: Diagnosis not present

## 2023-03-04 DIAGNOSIS — E890 Postprocedural hypothyroidism: Secondary | ICD-10-CM | POA: Diagnosis not present

## 2023-03-04 DIAGNOSIS — C73 Malignant neoplasm of thyroid gland: Secondary | ICD-10-CM | POA: Diagnosis not present

## 2023-03-13 DIAGNOSIS — M25562 Pain in left knee: Secondary | ICD-10-CM | POA: Diagnosis not present

## 2023-03-14 DIAGNOSIS — Z9884 Bariatric surgery status: Secondary | ICD-10-CM | POA: Diagnosis not present

## 2023-03-14 DIAGNOSIS — I10 Essential (primary) hypertension: Secondary | ICD-10-CM | POA: Diagnosis not present

## 2023-03-14 DIAGNOSIS — E569 Vitamin deficiency, unspecified: Secondary | ICD-10-CM | POA: Diagnosis not present

## 2023-03-14 DIAGNOSIS — Z6827 Body mass index (BMI) 27.0-27.9, adult: Secondary | ICD-10-CM | POA: Diagnosis not present

## 2023-03-14 DIAGNOSIS — Z8639 Personal history of other endocrine, nutritional and metabolic disease: Secondary | ICD-10-CM | POA: Diagnosis not present

## 2023-03-14 DIAGNOSIS — E559 Vitamin D deficiency, unspecified: Secondary | ICD-10-CM | POA: Diagnosis not present

## 2023-03-19 DIAGNOSIS — E559 Vitamin D deficiency, unspecified: Secondary | ICD-10-CM | POA: Diagnosis not present

## 2023-03-19 DIAGNOSIS — D849 Immunodeficiency, unspecified: Secondary | ICD-10-CM | POA: Diagnosis not present

## 2023-03-19 DIAGNOSIS — E569 Vitamin deficiency, unspecified: Secondary | ICD-10-CM | POA: Diagnosis not present

## 2023-03-29 ENCOUNTER — Other Ambulatory Visit: Payer: Self-pay | Admitting: Orthopedic Surgery

## 2023-03-29 ENCOUNTER — Encounter: Payer: Self-pay | Admitting: Orthopedic Surgery

## 2023-03-29 DIAGNOSIS — M25562 Pain in left knee: Secondary | ICD-10-CM

## 2023-04-01 ENCOUNTER — Ambulatory Visit
Admission: RE | Admit: 2023-04-01 | Discharge: 2023-04-01 | Disposition: A | Discharge status: Home / Self Care | Payer: Medicare PPO | Source: Ambulatory Visit | Attending: Orthopedic Surgery | Admitting: Orthopedic Surgery

## 2023-04-01 DIAGNOSIS — M25562 Pain in left knee: Secondary | ICD-10-CM | POA: Diagnosis not present

## 2023-04-01 DIAGNOSIS — M23322 Other meniscus derangements, posterior horn of medial meniscus, left knee: Secondary | ICD-10-CM | POA: Diagnosis not present

## 2023-04-01 DIAGNOSIS — M23352 Other meniscus derangements, posterior horn of lateral meniscus, left knee: Secondary | ICD-10-CM | POA: Diagnosis not present

## 2023-04-01 DIAGNOSIS — R600 Localized edema: Secondary | ICD-10-CM | POA: Diagnosis not present

## 2023-04-02 ENCOUNTER — Other Ambulatory Visit: Payer: Medicare PPO

## 2023-04-08 ENCOUNTER — Other Ambulatory Visit (HOSPITAL_COMMUNITY): Payer: Self-pay | Admitting: Psychiatry

## 2023-04-08 ENCOUNTER — Telehealth (HOSPITAL_COMMUNITY): Payer: Self-pay | Admitting: *Deleted

## 2023-04-08 MED ORDER — QUETIAPINE FUMARATE 25 MG PO TABS: 25.0000 mg | ORAL_TABLET | Freq: Every day | ORAL | 3 refills | Status: DC

## 2023-04-08 MED ORDER — PAROXETINE HCL 20 MG PO TABS: 20.0000 mg | ORAL_TABLET | Freq: Every day | ORAL | 3 refills | Status: DC

## 2023-04-08 MED ORDER — BUPROPION HCL ER (XL) 300 MG PO TB24: 300.0000 mg | ORAL_TABLET | ORAL | 2 refills | Status: DC

## 2023-04-08 MED ORDER — ALPRAZOLAM 1 MG PO TABS: 1.0000 mg | ORAL_TABLET | Freq: Two times a day (BID) | ORAL | 2 refills | Status: DC | PRN

## 2023-04-08 MED ORDER — TRAZODONE HCL 150 MG PO TABS: 150.0000 mg | ORAL_TABLET | Freq: Every day | ORAL | 3 refills | Status: DC

## 2023-04-08 NOTE — Telephone Encounter (Signed)
 sent

## 2023-04-08 NOTE — Telephone Encounter (Signed)
 Patient called stating she would like to have provider to please send in her script to [facility] due to new insurance. Per pt she do not want to run out of her scripts and stated that she would like for provider to please send all of her scripts that she writes for her to be sent to [facility].

## 2023-04-17 ENCOUNTER — Encounter (HOSPITAL_COMMUNITY): Payer: Self-pay | Admitting: Psychiatry

## 2023-04-17 ENCOUNTER — Telehealth (INDEPENDENT_AMBULATORY_CARE_PROVIDER_SITE_OTHER): Payer: 59 | Admitting: Psychiatry

## 2023-04-17 DIAGNOSIS — F411 Generalized anxiety disorder: Secondary | ICD-10-CM | POA: Diagnosis not present

## 2023-04-17 DIAGNOSIS — F322 Major depressive disorder, single episode, severe without psychotic features: Secondary | ICD-10-CM | POA: Diagnosis not present

## 2023-04-17 MED ORDER — BELSOMRA 20 MG PO TABS: 20.0000 mg | ORAL_TABLET | Freq: Every day | ORAL | 2 refills | Status: DC

## 2023-04-17 NOTE — Progress Notes (Signed)
 Virtual Visit via Video Note  I connected with April Ward on 04/17/23 at 11:00 AM EST by a video enabled telemedicine application and verified that I am speaking with the correct person using two identifiers.  Location: Patient: home Provider: office   I discussed the limitations of evaluation and management by telemedicine and the availability of in person appointments. The patient expressed understanding and agreed to proceed.      I discussed the assessment and treatment plan with the patient. The patient was provided an opportunity to ask questions and all were answered. The patient agreed with the plan and demonstrated an understanding of the instructions.   The patient was advised to call back or seek an in-person evaluation if the symptoms worsen or if the condition fails to improve as anticipated.  I provided 20 minutes of non-face-to-face time during this encounter.   April Annas, MD  [facility] MD/PA/NP OP Progress Note  04/17/2023 11:11 AM April Ward  MRN:  [id number]  Chief Complaint:  Chief Complaint  Patient presents with   Anxiety   Depression   Follow-up   HPI: This patient is a 63 year old widowed black female who lives with her son and ask in [place] .  She is now receiving Social Security retirement.  The patient returns for follow-up after 3 months regarding her depression and anxiety.  Last time she was very anxious because she had lost her disability but now she is getting retirement funds instead and seems to be doing okay financially.  She is having some new medical issues.  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  She wants to wait on doing any surgical interventions.  She is still trying to workout in the gym but is making sure she does not reinjure the knee.  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  She also finds that she is constantly craving carbohydrates.  She has "been binge eating".  She walks around eating bread all day.  I noted she had  gained 20 pounds since we started Seroquel  back in June.  We restarted the Seroquel  because she was having racing thoughts and inability to sleep.  The trazodone  caused her to wet the bed.  I suggested that we stop the Seroquel  in favor of Belsomra  which has less side effects and hopefully her weight will start to come down and the carbohydrate cravings should subside.  If not we will have to look at adding Vyvanse for binge eating. Visit Diagnosis:    ICD-10-CM   1. Major depressive disorder, single episode, severe without psychotic features (HCC)  F32.2     2. GAD (generalized anxiety disorder)  F41.1       Past Psychiatric History: Long-term outpatient treatment  Past Medical History:  Past Medical History:  Diagnosis Date   Anxiety    Arthritis    Depression    Diabetes mellitus without complication (HCC)    GERD (gastroesophageal reflux disease)    Gout    Heart murmur    Hypertension    Renal cancer [facility])    Renal disease 10/2012   Renal insufficiency     Past Surgical History:  Procedure Laterality Date   CESAREAN SECTION  [id number]   CHONDROPLASTY Right 08/22/2012   Procedure: CHONDROPLASTY;  Surgeon: Darrin Emerald, MD;  Location: [facility];  Service: Orthopedics;  Laterality: Right;   COLONOSCOPY WITH PROPOFOL  N/A 03/16/2014   Procedure: COLONOSCOPY WITH PROPOFOL  (at cecum 1023, total withdrawal time=9 minutes);  Surgeon: Alyce Jubilee, MD;  Location: [facility];  Service: Endoscopy;  Laterality: N/A;   GASTRIC BYPASS     KIDNEY TRANSPLANT  01/22/2021   KNEE ARTHROSCOPY WITH MEDIAL MENISECTOMY Right 08/22/2012   Procedure: KNEE ARTHROSCOPY WITH PARTIAL MEDIAL MENISECTOMY;  Surgeon: Darrin Emerald, MD;  Location: [facility];  Service: Orthopedics;  Laterality: Right;   PARTIAL NEPHRECTOMY  03/02/2013   left   TENDON REPAIR      Family Psychiatric History: See below  Family History:  Family History  Problem Relation Age of Onset   Heart attack Mother    Heart  attack Father    Anxiety disorder Sister    Colon cancer Maternal Aunt    Depression Paternal Aunt    Alcohol abuse Maternal Uncle    Colon cancer Maternal Uncle    Depression Son    Anxiety disorder Son     Social History:  Social History   Socioeconomic History   Marital status: Widowed    Spouse name: Not on file   Number of children: Not on file   Years of education: Not on file   Highest education level: Not on file  Occupational History   Occupation: [employer]    Employer: [employer]  Tobacco Use   Smoking status: Never    Passive exposure: Never   Smokeless tobacco: Never  Substance and Sexual Activity   Alcohol use: No    Alcohol/week: 0.0 standard drinks of alcohol   Drug use: No   Sexual activity: Not on file  Other Topics Concern   Not on file  Social History Narrative   Not on file   Social Drivers of Health   Financial Resource Strain: Not on file  Food Insecurity: Low Risk  (03/04/2023)   Received from [facility]   Hunger Vital Sign    Worried About Running Out of Food in the Last Year: Never true    Ran Out of Food in the Last Year: Never true  Transportation Needs: No Transportation Needs (03/04/2023)   Received from [employer]    In the past 12 months, has lack of reliable transportation kept you from medical appointments, meetings, work or from getting things needed for daily living? : No  Physical Activity: Not on file  Stress: No Stress Concern Present (03/03/2019)   Received from [facility], [facility] of Occupational Health - Occupational Stress Questionnaire    Feeling of Stress : Not at all  Social Connections: Not on file    Allergies:  Allergies  Allergen Reactions   Gentamicin  Sulfate Other (See Comments)    Per patient "blisters" blisters    Skelaxin [Metaxalone] Hives and Rash   Tape Rash and Other (See Comments)    Per patient "blisters"     Metabolic  Disorder Labs: No results found for: "HGBA1C", "MPG" No results found for: "PROLACTIN" Lab Results  Component Value Date   CHOL 215 (H) 03/11/2014   TRIG 288 (H) 03/11/2014   HDL 33 (L) 03/11/2014   CHOLHDL 6.5 03/11/2014   VLDL 58 (H) 03/11/2014   LDLCALC 124 (H) 03/11/2014   Lab Results  Component Value Date   TSH 0.04 (L) 01/03/2022    Therapeutic Level Labs: No results found for: "LITHIUM" No results found for: "VALPROATE" No results found for: "CBMZ"  Current Medications: Current Outpatient Medications  Medication Sig Dispense Refill   Suvorexant  (BELSOMRA ) 20 MG TABS Take 1  tablet (20 mg total) by mouth at bedtime. 30 tablet 2   ALPRAZolam  (XANAX ) 1 MG tablet Take 1 tablet (1 mg total) by mouth 2 (two) times daily as needed for anxiety. 60 tablet 2   buPROPion  (WELLBUTRIN  XL) 300 MG 24 hr tablet Take 1 tablet (300 mg total) by mouth every morning. 90 tablet 2   levothyroxine (SYNTHROID) 175 MCG tablet Take 175 mcg by mouth daily before breakfast.     MAGNESIUM PO Take 1 tablet by mouth daily.     PARoxetine  (PAXIL ) 20 MG tablet Take 1 tablet (20 mg total) by mouth daily. 90 tablet 3   POTASSIUM PO Take 1 tablet by mouth daily.     predniSONE  (DELTASONE ) 5 MG tablet Take 1 tablet by mouth daily.     Sulfamethoxazole-Trimethoprim (BACTRIM PO) Take 1 tablet by mouth. Every Monday, Wednesday and Friday     tacrolimus  (PROGRAF ) 1 MG capsule Take 1 mg by mouth 2 (two) times daily. 7 capsules     No current facility-administered medications for this visit.     Musculoskeletal: Strength & Muscle Tone: within normal limits Gait & Station: normal Patient leans: N/A  Psychiatric Specialty Exam: Review of Systems  Constitutional:  Positive for appetite change and unexpected weight change.  Musculoskeletal:  Positive for arthralgias and joint swelling.  All other systems reviewed and are negative.   There were no vitals taken for this visit.There is no height or weight on  file to calculate BMI.  General Appearance: Casual and Fairly Groomed  Eye Contact:  Good  Speech:  Clear and Coherent  Volume:  Normal  Mood:  Euthymic  Affect:  Congruent  Thought Process:  Goal Directed  Orientation:  Full (Time, Place, and Person)  Thought Content: WDL   Suicidal Thoughts:  No  Homicidal Thoughts:  No  Memory:  Immediate;   Good Recent;   Good Remote;   Good  Judgement:  Good  Insight:  Good  Psychomotor Activity:  Normal  Concentration:  Concentration: Good and Attention Span: Good  Recall:  Good  Fund of Knowledge: Good  Language: Good  Akathisia:  No  Handed:  Right  AIMS (if indicated): not done  Assets:  Communication Skills Desire for Improvement Resilience Social Support Talents/Skills  ADL's:  Intact  Cognition: WNL  Sleep:  Fair   Screenings: GAD-7    Garment/textile technologist Visit from 01/15/2023 in [facility] at [place] Office Visit from 10/22/2022 in [facility] at [place] Counselor from 10/10/2022 in [facility] at [place] Office Visit from 09/10/2022 in [facility] at [place] Counselor from 06/25/2022 in [facility] at [place]  Total GAD-7 Score 16 18 13 17 20       PHQ2-9    Flowsheet Row Office Visit from 01/15/2023 in [facility] at [place] Office Visit from 10/22/2022 in [facility] at [place] Office Visit from 09/10/2022 in [facility] at [place] Video Visit from 06/19/2022 in [facility] Outpatient [facility] at [place] Office Visit from 01/31/2022 in [facility] at [facility] Total Score 3 5 6 1 2   PHQ-9 Total Score 16 20 24  -- 4      Flowsheet Row Office Visit from 09/10/2022 in [place] at [place] Video  Visit from 06/19/2022 in [facility] at [facility] Visit  from 01/31/2022 in [facility] at [facility] RISK CATEGORY No Risk No Risk No Risk        Assessment and Plan: This patient is a 63 year old female with a history of end-stage renal disease status post kidney transplant depression anxiety and hypothyroidism.  Since adding Seroquel  for the racing thoughts and insomnia last summer she has been gaining weight and craving carbohydrates.  We will therefore stop the 25 mg Seroquel  in favor of Belsomra  20 mg at bedtime for sleep.  She will continue Paxil  20 mg daily and Wellbutrin  XL 300 mg daily for depression and Xanax  1 mg twice daily as needed for anxiety.  She will return to see me in 4 weeks  Collaboration of Care: Collaboration of Care: Other provider involved in patient's care AEB notes are shared with all providers on the epic system  Patient/Guardian was advised Release of Information must be obtained prior to any record release in order to collaborate their care with an outside provider. Patient/Guardian was advised if they have not already done so to contact the registration department to sign all necessary forms in order for us  to release information regarding their care.   Consent: Patient/Guardian gives verbal consent for treatment and assignment of benefits for services provided during this visit. Patient/Guardian expressed understanding and agreed to proceed.    April Annas, MD 04/17/2023, 11:11 AM

## 2023-04-18 ENCOUNTER — Other Ambulatory Visit (HOSPITAL_COMMUNITY): Payer: Self-pay | Admitting: Psychiatry

## 2023-08-20 ENCOUNTER — Ambulatory Visit: Admitting: Nurse Practitioner

## 2023-08-21 ENCOUNTER — Other Ambulatory Visit: Payer: Self-pay | Admitting: Family Medicine

## 2023-08-21 DIAGNOSIS — Z1231 Encounter for screening mammogram for malignant neoplasm of breast: Secondary | ICD-10-CM

## 2023-08-24 ENCOUNTER — Other Ambulatory Visit (HOSPITAL_COMMUNITY): Payer: Self-pay | Admitting: Psychiatry

## 2023-08-27 ENCOUNTER — Ambulatory Visit (INDEPENDENT_AMBULATORY_CARE_PROVIDER_SITE_OTHER): Admitting: Psychiatry

## 2023-08-27 ENCOUNTER — Encounter (HOSPITAL_COMMUNITY): Payer: Self-pay | Admitting: Psychiatry

## 2023-08-27 VITALS — BP 154/88 | HR 65 | Ht 71.0 in | Wt 168.4 lb

## 2023-08-27 DIAGNOSIS — F411 Generalized anxiety disorder: Secondary | ICD-10-CM

## 2023-08-27 DIAGNOSIS — F322 Major depressive disorder, single episode, severe without psychotic features: Secondary | ICD-10-CM

## 2023-08-27 MED ORDER — ALPRAZOLAM 1 MG PO TABS: 1.0000 mg | ORAL_TABLET | Freq: Two times a day (BID) | ORAL | 2 refills | Status: DC | PRN

## 2023-08-27 MED ORDER — BUPROPION HCL ER (XL) 300 MG PO TB24: 300.0000 mg | ORAL_TABLET | ORAL | 2 refills | Status: DC

## 2023-08-27 MED ORDER — PAROXETINE HCL 10 MG PO TABS: 10.0000 mg | ORAL_TABLET | Freq: Every day | ORAL | 2 refills | Status: DC

## 2023-08-27 MED ORDER — BELSOMRA 20 MG PO TABS: 20.0000 mg | ORAL_TABLET | Freq: Every day | ORAL | 2 refills | Status: DC

## 2023-08-27 NOTE — Progress Notes (Signed)
 [facility] MD/PA/NP OP Progress Note  08/27/2023 9:18 AM April Ward  MRN:  [id number]  Chief Complaint:  Chief Complaint  Patient presents with   Depression   Anxiety   Follow-up   HPI: This patient is a 63 year old widowed black female who lives with her son in [place] .  She is retired.  The patient returns for follow-up after about 5 months regarding her depression and anxiety.  She states that in general she is doing fairly well.  She is enjoying her gardening and going to the gym.  However she states that her family complains that she "has no emotions."  Even if she feels sad she cannot cry and she feels quite blunted.  She takes both Wellbutrin  and Paxil  and I think the Paxil  is a more likely of the 2 to cause the emotional blunting.  She is sleeping fairly well with the Belsomra .  She had gained weight on Seroquel  but has lost back down to 160 pounds which is a good weight for her height.  She is eating fairly well.  She denies any thoughts of suicide or self-harm Visit Diagnosis:    ICD-10-CM   1. Major depressive disorder, single episode, severe without psychotic features (HCC)  F32.2     2. GAD (generalized anxiety disorder)  F41.1       Past Psychiatric History: Long-term outpatient treatment  Past Medical History:  Past Medical History:  Diagnosis Date   Anxiety    Arthritis    Depression    Diabetes mellitus without complication (HCC)    GERD (gastroesophageal reflux disease)    Gout    Heart murmur    Hypertension    Renal cancer [facility])    Renal disease 10/2012   Renal insufficiency     Past Surgical History:  Procedure Laterality Date   CESAREAN SECTION  2951;8841   CHONDROPLASTY Right 08/22/2012   Procedure: CHONDROPLASTY;  Surgeon: Darrin Emerald, MD;  Location: [facility];  Service: Orthopedics;  Laterality: Right;   COLONOSCOPY WITH PROPOFOL  N/A 03/16/2014   Procedure: COLONOSCOPY WITH PROPOFOL  (at cecum 1023, total withdrawal time=9 minutes);  Surgeon:  Alyce Jubilee, MD;  Location: [facility];  Service: Endoscopy;  Laterality: N/A;   GASTRIC BYPASS     KIDNEY TRANSPLANT  01/22/2021   KNEE ARTHROSCOPY WITH MEDIAL MENISECTOMY Right 08/22/2012   Procedure: KNEE ARTHROSCOPY WITH PARTIAL MEDIAL MENISECTOMY;  Surgeon: Darrin Emerald, MD;  Location: [facility];  Service: Orthopedics;  Laterality: Right;   PARTIAL NEPHRECTOMY  03/02/2013   left   TENDON REPAIR      Family Psychiatric History: See below  Family History:  Family History  Problem Relation Age of Onset   Heart attack Mother    Heart attack Father    Anxiety disorder Sister    Colon cancer Maternal Aunt    Depression Paternal Aunt    Alcohol abuse Maternal Uncle    Colon cancer Maternal Uncle    Depression Son    Anxiety disorder Son     Social History:  Social History   Socioeconomic History   Marital status: Widowed    Spouse name: Not on file   Number of children: Not on file   Years of education: Not on file   Highest education level: Not on file  Occupational History   Occupation: [employer]    Employer: [employer]  Tobacco Use   Smoking status: Never    Passive exposure: Never   Smokeless tobacco:  Never  Substance and Sexual Activity   Alcohol use: No    Alcohol/week: 0.0 standard drinks of alcohol   Drug use: No   Sexual activity: Not on file  Other Topics Concern   Not on file  Social History Narrative   Not on file   Social Drivers of Health   Financial Resource Strain: Not on file  Food Insecurity: Low Risk  (07/17/2023)   Received from [facility]   Hunger Vital Sign    Worried About Running Out of Food in the Last Year: Never true    Ran Out of Food in the Last Year: Never true  Transportation Needs: No Transportation Needs (07/17/2023)   Received from [employer]    In the past 12 months, has lack of reliable transportation kept you from medical appointments, meetings, work or from getting things  needed for daily living? : No  Physical Activity: Not on file  Stress: No Stress Concern Present (03/03/2019)   Received from [facility], [facility] of Occupational Health - Occupational Stress Questionnaire    Feeling of Stress : Not at all  Social Connections: Not on file    Allergies:  Allergies  Allergen Reactions   Gentamicin  Sulfate Other (See Comments)    Per patient "blisters" blisters    Skelaxin [Metaxalone] Hives and Rash   Tape Rash and Other (See Comments)    Per patient "blisters"     Metabolic Disorder Labs: No results found for: "HGBA1C", "MPG" No results found for: "PROLACTIN" Lab Results  Component Value Date   CHOL 215 (H) 03/11/2014   TRIG 288 (H) 03/11/2014   HDL 33 (L) 03/11/2014   CHOLHDL 6.5 03/11/2014   VLDL 58 (H) 03/11/2014   LDLCALC 124 (H) 03/11/2014   Lab Results  Component Value Date   TSH 0.04 (L) 01/03/2022    Therapeutic Level Labs: No results found for: "LITHIUM" No results found for: "VALPROATE" No results found for: "CBMZ"  Current Medications: Current Outpatient Medications  Medication Sig Dispense Refill   amLODipine  (NORVASC ) 5 MG tablet Take 1 tablet by mouth daily.     PARoxetine  (PAXIL ) 10 MG tablet Take 1 tablet (10 mg total) by mouth daily. Dosage decrease 90 tablet 2   ALPRAZolam  (XANAX ) 1 MG tablet Take 1 tablet (1 mg total) by mouth 2 (two) times daily as needed for anxiety. 60 tablet 2   buPROPion  (WELLBUTRIN  XL) 300 MG 24 hr tablet Take 1 tablet (300 mg total) by mouth every morning. 90 tablet 2   levothyroxine (SYNTHROID) 175 MCG tablet Take 175 mcg by mouth daily before breakfast.     MAGNESIUM PO Take 1 tablet by mouth daily.     POTASSIUM PO Take 1 tablet by mouth daily.     predniSONE  (DELTASONE ) 5 MG tablet Take 1 tablet by mouth daily.     Sulfamethoxazole-Trimethoprim (BACTRIM PO) Take 1 tablet by mouth. Every Monday, Wednesday and Friday     Suvorexant  (BELSOMRA ) 20 MG TABS  Take 1 tablet (20 mg total) by mouth at bedtime. 90 tablet 2   tacrolimus  (PROGRAF ) 1 MG capsule Take 1 mg by mouth 2 (two) times daily. 7 capsules     No current facility-administered medications for this visit.     Musculoskeletal: Strength & Muscle Tone: within normal limits Gait & Station: normal Patient leans: N/A  Psychiatric Specialty Exam: Review of Systems  All other systems reviewed and are negative.  Blood pressure (!) 154/88, pulse 65, height 5\' 11"  (1.803 m), weight 168 lb 6.4 oz (76.4 kg), SpO2 100%.Body mass index is 23.49 kg/m.  General Appearance: Casual and Fairly Groomed  Eye Contact:  Good  Speech:  Clear and Coherent  Volume:  Normal  Mood:  Euthymic  Affect:  Flat  Thought Process:  Goal Directed  Orientation:  Full (Time, Place, and Person)  Thought Content: WDL   Suicidal Thoughts:  No  Homicidal Thoughts:  No  Memory:  Immediate;   Good Recent;   Good Remote;   NA  Judgement:  Good  Insight:  Good  Psychomotor Activity:  Normal  Concentration:  Concentration: Good and Attention Span: Good  Recall:  Good  Fund of Knowledge: Good  Language: Good  Akathisia:  No  Handed:  Right  AIMS (if indicated): not done  Assets:  Communication Skills Desire for Improvement Resilience Social Support Talents/Skills  ADL's:  Intact  Cognition: WNL  Sleep:  Good   Screenings: GAD-7    Flowsheet Row Office Visit from 01/15/2023 in [facility] at [place] Office Visit from 10/22/2022 in [facility] at [place] Counselor from 10/10/2022 in [facility] at [place] Office Visit from 09/10/2022 in [facility] at [place] Counselor from 06/25/2022 in [facility] Out[facility] at [place]  Total GAD-7 Score 16 18 13 17 20       PHQ2-9    Flowsheet Row Office Visit from 01/15/2023 in [facility]  at [place] Office Visit from 10/22/2022 in [facility] at [place] Office Visit from 09/10/2022 in [facility] at [place] Video Visit from 06/19/2022 in [facility] Outpatient [facility] at [place] Office Visit from 01/31/2022 in [facility] at [facility] Total Score 3 5 6 1 2   PHQ-9 Total Score 16 20 24  -- 4      Flowsheet Row Office Visit from 09/10/2022 in [facility] at [place] Video Visit from 06/19/2022 in [facility] Out[facility]al Health at [place] Office Visit from 01/31/2022 in [facility] Out[facility] Behavioral Health at [place]  C-SSRS RISK CATEGORY No Risk No Risk No Risk        Assessment and Plan: This patient is a 63 year old female with a history of end-stage renal disease status post kidney transplant depression anxiety and hypothyroidism.  She is complaining that her affect appears flat to others and she agrees she does not have a lot of variation in emotion and feels like she cannot cry when she is sad.  Will therefore decrease the Paxil  dosage from 20 to 10 mg daily and continue Wellbutrin  XL 300 mg daily for depression.  She will continue Xanax  1 mg twice daily as needed for anxiety and Belsomra  20 mg at bedtime for sleep.  She will return to see me in 3 months or call sooner as needed  Collaboration of Care: Collaboration of Care: Other provider involved in patient's care AEB all providers in patient's care are able to access notes in the epic system  Patient/Guardian was advised Release of Information must be obtained prior to any record release in order to collaborate their care with an outside provider. Patient/Guardian was advised if they have not already done so to contact the registration department to sign all necessary forms in order for us  to release information regarding their care.   Consent: Patient/Guardian gives  verbal consent for treatment and assignment of benefits for services provided during this visit. Patient/Guardian expressed understanding and agreed to proceed.    Alfredia Annas, MD 08/27/2023, 9:18 AM

## 2023-09-02 ENCOUNTER — Ambulatory Visit (HOSPITAL_COMMUNITY)

## 2023-09-04 ENCOUNTER — Ambulatory Visit: Attending: Nurse Practitioner | Admitting: Nurse Practitioner

## 2023-09-04 NOTE — Progress Notes (Deleted)
  Cardiology Office Note   Date:  09/04/2023  ID:  RICHELLE GLICK, DOB [birth year], MRN [id number] PCP: Wilburn Handler, MD  [facility] Providers Cardiologist:  Armida Lander, MD { Click to update primary MD,subspecialty MD or APP then REFRESH:1}    History of Present Illness April Ward is a 63 y.o. female with a PMH of hypertension, aortic valve stenosis, history of renal cell carcinoma, s/p left partial nephrectomy in 2014, ESRD, s/p transplant and was previously on peritoneal dialysis, who presents today for overdue 1 year follow-up.  [facility] showed mild to moderate aortic valve stenosis.  Last seen by Dr. Armida Lander on December 26, 2021.  Was overall doing well at that time and recommended to repeat echocardiogram likely in 2 years.  Today she presents for overdue follow-up.  She states  ROS: ***  Studies Reviewed      *** Risk Assessment/Calculations {Does this patient have ATRIAL FIBRILLATION?[phone]} No BP recorded.  {Refresh Note OR Click here to enter BP  :1}***       Physical Exam VS:  There were no vitals taken for this visit.   Wt Readings from Last 3 Encounters:  12/26/21 147 lb (66.7 kg)  12/08/19 214 lb 3.2 oz (97.2 kg)  04/10/19 198 lb (89.8 kg)    GEN: Well nourished, well developed in no acute distress NECK: No JVD; No carotid bruits CARDIAC: ***RRR, no murmurs, rubs, gallops RESPIRATORY:  Clear to auscultation without rales, wheezing or rhonchi  ABDOMEN: Soft, non-tender, non-distended EXTREMITIES:  No edema; No deformity   ASSESSMENT AND PLAN ***    {Are you ordering a CV Procedure (e.g. stress test, cath, DCCV, TEE, etc)?   Press F2        :[id number]}  Dispo: ***  Signed, Lasalle Pointer, NP

## 2023-10-07 ENCOUNTER — Other Ambulatory Visit (HOSPITAL_COMMUNITY): Payer: Self-pay | Admitting: Family Medicine

## 2023-10-07 DIAGNOSIS — Z1231 Encounter for screening mammogram for malignant neoplasm of breast: Secondary | ICD-10-CM

## 2023-10-11 ENCOUNTER — Encounter (HOSPITAL_COMMUNITY): Payer: Self-pay

## 2023-10-11 ENCOUNTER — Ambulatory Visit (HOSPITAL_COMMUNITY)
Admission: RE | Admit: 2023-10-11 | Discharge: 2023-10-11 | Disposition: A | Discharge status: Home / Self Care | Source: Ambulatory Visit | Attending: Family Medicine | Admitting: Family Medicine

## 2023-10-11 DIAGNOSIS — Z1231 Encounter for screening mammogram for malignant neoplasm of breast: Secondary | ICD-10-CM | POA: Insufficient documentation

## 2023-11-06 ENCOUNTER — Telehealth (HOSPITAL_COMMUNITY): Payer: Self-pay

## 2023-11-06 NOTE — Telephone Encounter (Signed)
---  paperwork--- Pt dropped out paperwork for guardian life to be filled out by DR Okey, papers filled out and faxed. Called pt to let her know this information no answer left vm

## 2023-11-13 ENCOUNTER — Encounter (HOSPITAL_COMMUNITY): Payer: Self-pay | Admitting: Psychiatry

## 2023-11-13 ENCOUNTER — Ambulatory Visit (INDEPENDENT_AMBULATORY_CARE_PROVIDER_SITE_OTHER): Admitting: Psychiatry

## 2023-11-13 VITALS — BP 128/80 | HR 60 | Ht 71.0 in | Wt 156.8 lb

## 2023-11-13 DIAGNOSIS — F411 Generalized anxiety disorder: Secondary | ICD-10-CM

## 2023-11-13 DIAGNOSIS — F322 Major depressive disorder, single episode, severe without psychotic features: Secondary | ICD-10-CM | POA: Diagnosis not present

## 2023-11-13 MED ORDER — PAROXETINE HCL 10 MG PO TABS: 10.0000 mg | ORAL_TABLET | Freq: Every day | ORAL | 2 refills | Status: DC

## 2023-11-13 MED ORDER — BUPROPION HCL ER (XL) 300 MG PO TB24: 300.0000 mg | ORAL_TABLET | ORAL | 2 refills | Status: DC

## 2023-11-13 MED ORDER — BELSOMRA 20 MG PO TABS: 20.0000 mg | ORAL_TABLET | Freq: Every day | ORAL | 2 refills | Status: DC

## 2023-11-13 MED ORDER — ALPRAZOLAM 1 MG PO TABS: 1.0000 mg | ORAL_TABLET | Freq: Two times a day (BID) | ORAL | 2 refills | Status: DC | PRN

## 2023-11-13 NOTE — Progress Notes (Signed)
 BH MD/PA/NP OP Progress Note  11/13/2023 9:29 AM April Ward  MRN:  [id number]  Chief Complaint:  Chief Complaint  Patient presents with   Depression   Anxiety   Follow-up   HPI: This patient is a 63 year old widowed black female lives with her son in [place] .  She is retired.  The patient returns for follow-up after 3 months regarding her depression and anxiety.  She states that she continues to do well.  Last time we cut down her Paxil  from 20 to 10 mg because of perceived emotional blunting.  She does not see much difference.  However she feels comfortable at her current dosages of antidepressants.  She denies being depressed and is rarely anxious or needing to use the Xanax .  She is sleeping well with the Belsomra .  Her energy levels pretty good.  She has not had any complications from her kidney transplant.  Her weight is a little bit low and she states for a while her thyroid  was out of whack but now it is back to normal.  She states that she is eating fairly well.  She denies any thoughts of suicide or self-harm Visit Diagnosis:    ICD-10-CM   1. Major depressive disorder, single episode, severe without psychotic features (HCC)  F32.2     2. GAD (generalized anxiety disorder)  F41.1       Past Psychiatric History: Long-term outpatient treatment  Past Medical History:  Past Medical History:  Diagnosis Date   Anxiety    Arthritis    Depression    Diabetes mellitus without complication (HCC)    GERD (gastroesophageal reflux disease)    Gout    Heart murmur    Hypertension    Renal cancer [facility])    Renal disease 10/2012   Renal insufficiency     Past Surgical History:  Procedure Laterality Date   CESAREAN SECTION  [id number]   CHONDROPLASTY Right 08/22/2012   Procedure: CHONDROPLASTY;  Surgeon: Taft FORBES Minerva, MD;  Location: [facility];  Service: Orthopedics;  Laterality: Right;   COLONOSCOPY WITH PROPOFOL  N/A 03/16/2014   Procedure: COLONOSCOPY WITH PROPOFOL   (at cecum 1023, total withdrawal time=9 minutes);  Surgeon: Margo LITTIE Haddock, MD;  Location: [facility];  Service: Endoscopy;  Laterality: N/A;   GASTRIC BYPASS     KIDNEY TRANSPLANT  01/22/2021   KNEE ARTHROSCOPY WITH MEDIAL MENISECTOMY Right 08/22/2012   Procedure: KNEE ARTHROSCOPY WITH PARTIAL MEDIAL MENISECTOMY;  Surgeon: Taft FORBES Minerva, MD;  Location: [facility];  Service: Orthopedics;  Laterality: Right;   PARTIAL NEPHRECTOMY  03/02/2013   left   TENDON REPAIR      Family Psychiatric History: See below  Family History:  Family History  Problem Relation Age of Onset   Heart attack Mother    Heart attack Father    Anxiety disorder Sister    Colon cancer Maternal Aunt    Depression Paternal Aunt    Alcohol abuse Maternal Uncle    Colon cancer Maternal Uncle    Depression Son    Anxiety disorder Son     Social History:  Social History   Socioeconomic History   Marital status: Widowed    Spouse name: Not on file   Number of children: Not on file   Years of education: Not on file   Highest education level: Not on file  Occupational History   Occupation: [employer]    Employer: [employer]  Tobacco Use   Smoking status: Never  Passive exposure: Never   Smokeless tobacco: Never  Substance and Sexual Activity   Alcohol use: No    Alcohol/week: 0.0 standard drinks of alcohol   Drug use: No   Sexual activity: Not on file  Other Topics Concern   Not on file  Social History Narrative   Not on file   Social Drivers of Health   Financial Resource Strain: Not on file  Food Insecurity: Low Risk  (09/18/2023)   Received from [facility]   Hunger Vital Sign    Within the past 12 months, you worried that your food would run out before you got money to buy more: Never true    Within the past 12 months, the food you bought just didn't last and you didn't have money to get more. : Never true  Transportation Needs: No Transportation Needs (09/18/2023)    Received from [employer]    In the past 12 months, has lack of reliable transportation kept you from medical appointments, meetings, work or from getting things needed for daily living? : No  Physical Activity: Not on file  Stress: No Stress Concern Present (03/03/2019)   Received from [facility] of Occupational Health - Occupational Stress Questionnaire    Feeling of Stress : Not at all  Social Connections: Not on file    Allergies:  Allergies  Allergen Reactions   Gentamicin  Sulfate Other (See Comments)    Per patient blisters blisters    Skelaxin [Metaxalone] Hives and Rash   Tape Rash and Other (See Comments)    Per patient blisters     Metabolic Disorder Labs: No results found for: HGBA1C, MPG No results found for: PROLACTIN Lab Results  Component Value Date   CHOL 215 (H) 03/11/2014   TRIG 288 (H) 03/11/2014   HDL 33 (L) 03/11/2014   CHOLHDL 6.5 03/11/2014   VLDL 58 (H) 03/11/2014   LDLCALC 124 (H) 03/11/2014   Lab Results  Component Value Date   TSH 0.04 (L) 01/03/2022    Therapeutic Level Labs: No results found for: LITHIUM No results found for: VALPROATE No results found for: CBMZ  Current Medications: Current Outpatient Medications  Medication Sig Dispense Refill   levothyroxine (SYNTHROID) 175 MCG tablet Take 175 mcg by mouth daily before breakfast.     MAGNESIUM PO Take 1 tablet by mouth daily.     POTASSIUM PO Take 1 tablet by mouth daily.     predniSONE  (DELTASONE ) 5 MG tablet Take 1 tablet by mouth daily.     Sulfamethoxazole-Trimethoprim (BACTRIM PO) Take 1 tablet by mouth. Every Monday, Wednesday and Friday     tacrolimus  (PROGRAF ) 1 MG capsule Take 1 mg by mouth 2 (two) times daily. 7 capsules     ALPRAZolam  (XANAX ) 1 MG tablet Take 1 tablet (1 mg total) by mouth 2 (two) times daily as needed for anxiety. 60 tablet 2   buPROPion  (WELLBUTRIN  XL) 300 MG 24 hr tablet Take 1 tablet (300  mg total) by mouth every morning. 90 tablet 2   PARoxetine  (PAXIL ) 10 MG tablet Take 1 tablet (10 mg total) by mouth daily. Dosage decrease 90 tablet 2   Suvorexant  (BELSOMRA ) 20 MG TABS Take 1 tablet (20 mg total) by mouth at bedtime. 90 tablet 2   No current facility-administered medications for this visit.     Musculoskeletal: Strength & Muscle Tone: within normal limits Gait & Station: normal Patient leans: N/A  Psychiatric Specialty Exam:  Review of Systems  All other systems reviewed and are negative.   Blood pressure 128/80, pulse 60, height 5' 11 (1.803 m), weight 156 lb 12.8 oz (71.1 kg), SpO2 98%.Body mass index is 21.87 kg/m.  General Appearance: Casual and Fairly Groomed  Eye Contact:  Good  Speech:  Clear and Coherent  Volume:  Normal  Mood:  Euthymic  Affect:  Congruent  Thought Process:  Goal Directed  Orientation:  Full (Time, Place, and Person)  Thought Content: WDL   Suicidal Thoughts:  No  Homicidal Thoughts:  No  Memory:  Immediate;   Good Recent;   Good Remote;   Fair  Judgement:  Good  Insight:  Good  Psychomotor Activity:  Normal  Concentration:  Concentration: Good and Attention Span: Good  Recall:  Good  Fund of Knowledge: Good  Language: Good  Akathisia:  No  Handed:  Right  AIMS (if indicated): not done  Assets:  Communication Skills Desire for Improvement Resilience Social Support Talents/Skills  ADL's:  Intact  Cognition: WNL  Sleep:  Good   Screenings: GAD-7    Flowsheet Row Office Visit from 01/15/2023 in [facility] at [place] Office Visit from 10/22/2022 in [facility] at [place] Counselor from 10/10/2022 in [facility] at [place] Office Visit from 09/10/2022 in [facility] at [place] Counselor from 06/25/2022 in [facility] at [place]  Total GAD-7 Score 16 18 13 17 20     PHQ2-9    Flowsheet Row Office Visit from 01/15/2023 in [facility] at [place] Office Visit from 10/22/2022 in [facility] at [place] Office Visit from 09/10/2022 in [facility] at [place] Video Visit from 06/19/2022 in [facility] at [place] Office Visit from 01/31/2022 in [facility] Total Score 3 5 6 1 2   PHQ-9 Total Score 16 20 24  -- 4   Flowsheet Row Office Visit from 09/10/2022 in [facility] at [place] Video Visit from 06/19/2022 in [facility] at [place] Office Visit from 01/31/2022 in [facility] at [place]  C-SSRS RISK CATEGORY No Risk No Risk No Risk     Assessment and Plan: This patient is a 63 year old female with a history of end-stage renal disease, status post kidney transplant hypothyroidism depression and anxiety.  She seems to be doing well on her current regimen.  She will continue Wellbutrin  XL 300 mg daily for depression and Paxil  10 mg daily also for depression.  She will continue Xanax  1 mg twice daily only as needed for anxiety and Belsomra  20 mg at bedtime for sleep.  She will return to see me in 3 months.  Collaboration of Care: Collaboration of Care: Other provider involved in patient's care AEB all providers in patient's care able to access notes in the epic system  Patient/Guardian was advised Release of Information must be obtained prior to any record release in order to collaborate their care with an outside provider. Patient/Guardian was advised if they have not already done so to contact the registration department to sign all necessary forms in order for us  to release information regarding their care.   Consent: Patient/Guardian gives verbal consent for treatment and assignment of benefits for  services provided during this visit. Patient/Guardian expressed understanding and agreed to proceed.    Barnie Gull, MD  11/13/2023, 9:29 AM

## 2023-11-22 ENCOUNTER — Other Ambulatory Visit (HOSPITAL_COMMUNITY): Payer: Self-pay | Admitting: Psychiatry

## 2023-11-25 ENCOUNTER — Ambulatory Visit
Admission: RE | Admit: 2023-11-25 | Discharge: 2023-11-25 | Disposition: A | Discharge status: Home / Self Care | Source: Ambulatory Visit | Attending: Family Medicine | Admitting: Family Medicine

## 2023-11-25 NOTE — ED Notes (Addendum)
 Called pt from waiting room twice and via phone twice. No response/answer. Pt will be removed from board.

## 2024-01-23 ENCOUNTER — Encounter (INDEPENDENT_AMBULATORY_CARE_PROVIDER_SITE_OTHER): Payer: Self-pay | Admitting: *Deleted

## 2024-02-13 ENCOUNTER — Encounter (HOSPITAL_COMMUNITY): Payer: Self-pay

## 2024-02-13 ENCOUNTER — Ambulatory Visit (HOSPITAL_COMMUNITY): Admitting: Psychiatry

## 2024-02-13 ENCOUNTER — Encounter (HOSPITAL_COMMUNITY): Payer: Self-pay | Admitting: Psychiatry

## 2024-02-13 ENCOUNTER — Ambulatory Visit (INDEPENDENT_AMBULATORY_CARE_PROVIDER_SITE_OTHER): Admitting: Psychiatry

## 2024-02-13 ENCOUNTER — Encounter: Payer: Self-pay | Admitting: Cardiology

## 2024-02-13 ENCOUNTER — Ambulatory Visit: Attending: Cardiology | Admitting: Cardiology

## 2024-02-13 VITALS — BP 130/72 | HR 62 | Ht 71.0 in | Wt 154.8 lb

## 2024-02-13 VITALS — BP 114/72 | HR 64 | Ht 71.0 in | Wt 150.2 lb

## 2024-02-13 DIAGNOSIS — F322 Major depressive disorder, single episode, severe without psychotic features: Secondary | ICD-10-CM | POA: Diagnosis not present

## 2024-02-13 DIAGNOSIS — F411 Generalized anxiety disorder: Secondary | ICD-10-CM

## 2024-02-13 DIAGNOSIS — Z136 Encounter for screening for cardiovascular disorders: Secondary | ICD-10-CM | POA: Diagnosis not present

## 2024-02-13 DIAGNOSIS — I350 Nonrheumatic aortic (valve) stenosis: Secondary | ICD-10-CM

## 2024-02-13 MED ORDER — ALPRAZOLAM 1 MG PO TABS: 1.0000 mg | ORAL_TABLET | Freq: Two times a day (BID) | ORAL | 2 refills | Status: AC | PRN

## 2024-02-13 MED ORDER — PAROXETINE HCL 10 MG PO TABS
10.0000 mg | ORAL_TABLET | Freq: Every day | ORAL | 2 refills | Status: AC
Start: 2024-02-13 — End: ?

## 2024-02-13 MED ORDER — QUETIAPINE FUMARATE 25 MG PO TABS: 25.0000 mg | ORAL_TABLET | Freq: Every day | ORAL | 2 refills | Status: AC

## 2024-02-13 MED ORDER — BUPROPION HCL ER (XL) 300 MG PO TB24: 300.0000 mg | ORAL_TABLET | ORAL | 2 refills | Status: AC

## 2024-02-13 NOTE — Progress Notes (Signed)
 BH MD/PA/NP OP Progress Note  02/13/2024 11:37 AM April Ward  MRN:  [id number]  Chief Complaint:  Chief Complaint  Patient presents with   Anxiety   Depression   HPI: This is patient is a 63 year old widowed black female who lives with her son in [place], [place] .  She is retired.  The patient returns for follow-up after 3 months regarding her major depressive disorder and generalized anxiety disorder.  She states she is not sleeping as well since we stopped the Seroquel  and she would like to go back to it.  The Belsomra  really is not working for her anymore.  She states her mood has been stable and she denies significant depression.  She continues on Paxil  and Wellbutrin  for this.  She has not had any new health concerns.  Her energy level is pretty good.  She denies thoughts of self-harm or suicide.  She is eating fairly well Visit Diagnosis:    ICD-10-CM   1. Major depressive disorder, single episode, severe without psychotic features (HCC)  F32.2     2. GAD (generalized anxiety disorder)  F41.1       Past Psychiatric History: Long-term outpatient treatment  Past Medical History:  Past Medical History:  Diagnosis Date   Anxiety    Arthritis    Depression    Diabetes mellitus without complication (HCC)    GERD (gastroesophageal reflux disease)    Gout    Heart murmur    Hypertension    Renal cancer [facility])    Renal disease 10/2012   Renal insufficiency     Past Surgical History:  Procedure Laterality Date   CESAREAN SECTION  [id number]   CHONDROPLASTY Right 08/22/2012   Procedure: CHONDROPLASTY;  Surgeon: Taft FORBES Minerva, MD;  Location: [facility];  Service: Orthopedics;  Laterality: Right;   COLONOSCOPY WITH PROPOFOL  N/A 03/16/2014   Procedure: COLONOSCOPY WITH PROPOFOL  (at cecum 1023, total withdrawal time=9 minutes);  Surgeon: Margo LITTIE Haddock, MD;  Location: [facility];  Service: Endoscopy;  Laterality: N/A;   GASTRIC BYPASS     KIDNEY TRANSPLANT  01/22/2021   KNEE  ARTHROSCOPY WITH MEDIAL MENISECTOMY Right 08/22/2012   Procedure: KNEE ARTHROSCOPY WITH PARTIAL MEDIAL MENISECTOMY;  Surgeon: Taft FORBES Minerva, MD;  Location: [facility];  Service: Orthopedics;  Laterality: Right;   PARTIAL NEPHRECTOMY  03/02/2013   left   TENDON REPAIR      Family Psychiatric History: See below  Family History:  Family History  Problem Relation Age of Onset   Heart attack Mother    Heart attack Father    Anxiety disorder Sister    Colon cancer Maternal Aunt    Depression Paternal Aunt    Alcohol abuse Maternal Uncle    Colon cancer Maternal Uncle    Depression Son    Anxiety disorder Son     Social History:  Social History   Socioeconomic History   Marital status: Widowed    Spouse name: Not on file   Number of children: Not on file   Years of education: Not on file   Highest education level: Not on file  Occupational History   Occupation: [employer]    Employer: [employer]  Tobacco Use   Smoking status: Never    Passive exposure: Never   Smokeless tobacco: Never  Vaping Use   Vaping status: Never Used  Substance and Sexual Activity   Alcohol use: No    Alcohol/week: 0.0 standard drinks of alcohol   Drug use: No  Sexual activity: Not on file  Other Topics Concern   Not on file  Social History Narrative   Not on file   Social Drivers of Health   Financial Resource Strain: Not on file  Food Insecurity: Low Risk  (01/20/2024)   Received from [facility]   Hunger Vital Sign    Within the past 12 months, you worried that your food would run out before you got money to buy more: Never true    Within the past 12 months, the food you bought just didn't last and you didn't have money to get more. : Never true  Transportation Needs: No Transportation Needs (01/20/2024)   Received from [employer]    In the past 12 months, has lack of reliable transportation kept you from medical appointments, meetings, work or  from getting things needed for daily living? : No  Physical Activity: Sufficiently Active (12/04/2023)   Received from [facility]   Exercise Vital Sign    On average, how many days per week do you engage in moderate to strenuous exercise (like a brisk walk)?: 3 days    On average, how many minutes do you engage in exercise at this level?: 60 min  Stress: Stress Concern Present (12/04/2023)   Received from [facility] of Occupational Health - Occupational Stress Questionnaire    Do you feel stress - tense, restless, nervous, or anxious, or unable to sleep at night because your mind is troubled all the time - these days?: Rather much  Social Connections: Not on file    Allergies:  Allergies  Allergen Reactions   Gentamicin  Sulfate Other (See Comments)    Per patient blisters blisters    Skelaxin [Metaxalone] Hives and Rash   Tape Rash and Other (See Comments)    Per patient blisters     Metabolic Disorder Labs: No results found for: HGBA1C, MPG No results found for: PROLACTIN Lab Results  Component Value Date   CHOL 215 (H) 03/11/2014   TRIG 288 (H) 03/11/2014   HDL 33 (L) 03/11/2014   CHOLHDL 6.5 03/11/2014   VLDL 58 (H) 03/11/2014   LDLCALC 124 (H) 03/11/2014   Lab Results  Component Value Date   TSH 0.04 (L) 01/03/2022    Therapeutic Level Labs: No results found for: LITHIUM No results found for: VALPROATE No results found for: CBMZ  Current Medications: Current Outpatient Medications  Medication Sig Dispense Refill   levothyroxine (SYNTHROID) 150 MCG tablet Take 150 mcg by mouth every morning.     MAGNESIUM PO Take 1 tablet by mouth daily.     predniSONE  (DELTASONE ) 5 MG tablet Take 1 tablet by mouth daily.     ALPRAZolam  (XANAX ) 1 MG tablet Take 1 tablet (1 mg total) by mouth 2 (two) times daily as needed for anxiety. 60 tablet 2   buPROPion  (WELLBUTRIN  XL) 300 MG 24 hr tablet Take 1 tablet (300 mg total) by mouth  every morning. 90 tablet 2   PARoxetine  (PAXIL ) 10 MG tablet Take 1 tablet (10 mg total) by mouth daily. Dosage decrease 90 tablet 2   POTASSIUM PO Take 1 tablet by mouth daily. (Patient not taking: Reported on 02/13/2024)     QUEtiapine  (SEROQUEL ) 25 MG tablet Take 1 tablet (25 mg total) by mouth at bedtime. 90 tablet 2   Sulfamethoxazole-Trimethoprim (BACTRIM PO) Take 1 tablet by mouth. Every Monday, Wednesday and Friday (Patient not taking: Reported on 02/13/2024)  tacrolimus  (PROGRAF ) 1 MG capsule Take 1 mg by mouth 2 (two) times daily. 7 capsules (Patient not taking: Reported on 02/13/2024)     No current facility-administered medications for this visit.     Musculoskeletal: Strength & Muscle Tone: within normal limits Gait & Station: normal Patient leans: N/A  Psychiatric Specialty Exam: Review of Systems  All other systems reviewed and are negative.   Blood pressure 114/72, pulse 64, height 5' 11 (1.803 m), weight 150 lb 3.2 oz (68.1 kg), SpO2 99%.Body mass index is 20.95 kg/m.  General Appearance: Casual, Neat, and Well Groomed  Eye Contact:  Good  Speech:  Clear and Coherent  Volume:  Normal  Mood:  Euthymic  Affect:  Congruent  Thought Process:  Goal Directed  Orientation:  Full (Time, Place, and Person)  Thought Content: WDL   Suicidal Thoughts:  No  Homicidal Thoughts:  No  Memory:  Immediate;   Good Recent;   Good Remote;   Good  Judgement:  Good  Insight:  Good  Psychomotor Activity:  Normal  Concentration:  Concentration: Good and Attention Span: Good  Recall:  Good  Fund of Knowledge: Good  Language: Good  Akathisia:  No  Handed:  Right  AIMS (if indicated): not done  Assets:  Communication Skills Desire for Improvement Resilience Social Support Talents/Skills  ADL's:  Intact  Cognition: WNL  Sleep:  Fair   Screenings: GAD-7    Garment/textile Technologist Visit from 01/15/2023 in [facility] at [place] Office Visit  from 10/22/2022 in [facility] at [place] Counselor from 10/10/2022 in [facility] at [place] Office Visit from 09/10/2022 in [facility] at [place] Counselor from 06/25/2022 in [facility] at [place]  Total GAD-7 Score 16 18 13 17 20    PHQ2-9    Flowsheet Row Office Visit from 01/15/2023 in [facility] at [place] Office Visit from 10/22/2022 in [facility] at [place] Office Visit from 09/10/2022 in [facility] at [place] Video Visit from 06/19/2022 in [facility] at [place] Office Visit from 01/31/2022 in [facility] at [facility] Total Score 3 5 6 1 2   PHQ-9 Total Score 16 20 24  -- 4   Flowsheet Row Office Visit from 09/10/2022 in [facility] at [place] Video Visit from 06/19/2022 in [facility] Out[facility] at [place] Office Visit from 01/31/2022 in [facility] at [place]  C-SSRS RISK CATEGORY No Risk No Risk No Risk     Assessment and Plan: This patient is a 63 year old female with a history of major depressive disorder and generalized anxiety disorder.  She is doing well although she is not sleeping well.  She will continue Wellbutrin  XL 300 mg daily as well as Paxil  10 mg daily for major depression, Xanax  1 mg twice daily as needed for generalized anxiety.  We will restart Seroquel  25 mg at bedtime for anxiety and sleep.  She will return to see me in 3 months  Collaboration of Care: Collaboration of Care: Other provider involved in patient's care AEB all providers in patient's care have access to notes through the epic system  Patient/Guardian was advised Release of Information must be obtained prior to any record release in order to  collaborate their care with an outside provider. Patient/Guardian was advised if they have not already  done so to contact the registration department to sign all necessary forms in order for us  to release information regarding their care.   Consent: Patient/Guardian gives verbal consent for treatment and assignment of benefits for services provided during this visit. Patient/Guardian expressed understanding and agreed to proceed.    Barnie Gull, MD 02/13/2024, 11:37 AM

## 2024-02-13 NOTE — Patient Instructions (Signed)

## 2024-02-13 NOTE — Progress Notes (Signed)
 Clinical Summary Ms. Zipp is a 63 y.o.female seen today for follow up of the following medical problems.      1. HTN   - reports diagnosed with HTN age 84. Historically has been difficult to control - CT scan in 2006 with normal renal arteries. TSH 1.80. No EtoH. Normal renin and aldo ratio. Normal sleep study 10/2013 at [facility].   - since gastric sleeve surgery she has had significant weight loss, bp's have trended down and she has been taken off some of her bp meds    - home bp's 140s/80s, overall trending up. Has had 16 lbs weight gain since Jan 2021   From 12/18/21 nephrology note: BP in clinic today was 126/69 (HR 87). Home SBP ranges 120-130, DBP ranges 60-80 on current regimen of Amlodipine  PRN - she is down around 70 lbs since I last saw her in 2021 - currently not on HTN meds  - home bp's 110s/60s       2. ESRD, prevoiusly peritoneal dialysis now s/p transplant - followed Dr Genette at [facility].   - 12/2020 had kidney transplant     3. History of renal cell CA - s/p left partial nephrectomy in 2014     4. 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 Past Medical History:  Diagnosis Date   Anxiety    Arthritis    Depression    Diabetes mellitus without complication (HCC)    GERD (gastroesophageal reflux disease)    Gout    Heart murmur    Hypertension    Renal cancer (HCC)    Renal disease 10/2012   Renal insufficiency      Allergies  Allergen Reactions   Gentamicin  Sulfate Other (See Comments)    Per patient blisters blisters    Skelaxin [Metaxalone] Hives and Rash   Tape Rash and Other (See Comments)    Per patient blisters      Current Outpatient Medications  Medication Sig Dispense Refill   ALPRAZolam  (XANAX ) 1 MG tablet Take 1 tablet (1 mg  total) by mouth 2 (two) times daily as needed for anxiety. 60 tablet 2   buPROPion  (WELLBUTRIN  XL) 300 MG 24 hr tablet Take 1 tablet (300 mg total) by mouth every morning. 90 tablet 2   levothyroxine (SYNTHROID) 175 MCG tablet Take 175 mcg by mouth daily before breakfast.     MAGNESIUM PO Take 1 tablet by mouth daily.     PARoxetine  (PAXIL ) 10 MG tablet Take 1 tablet (10 mg total) by mouth daily. Dosage decrease 90 tablet 2   POTASSIUM PO Take 1 tablet by mouth daily.     predniSONE  (DELTASONE ) 5 MG tablet Take 1 tablet by mouth daily.     Sulfamethoxazole-Trimethoprim (BACTRIM PO) Take 1 tablet by mouth. Every Monday, Wednesday and Friday     Suvorexant  (BELSOMRA ) 20 MG TABS Take 1 tablet (20 mg total) by mouth at bedtime. 90 tablet 2   tacrolimus  (PROGRAF ) 1 MG capsule Take 1 mg by mouth 2 (two) times daily. 7 capsules     No current facility-administered medications for this visit.     Past Surgical History:  Procedure Laterality Date   CESAREAN SECTION  8006;8015   CHONDROPLASTY Right 08/22/2012   Procedure: CHONDROPLASTY;  Surgeon: Taft BRAVO  Margrette, MD;  Location: [facility];  Service: Orthopedics;  Laterality: Right;   COLONOSCOPY WITH PROPOFOL  N/A 03/16/2014   Procedure: COLONOSCOPY WITH PROPOFOL  (at cecum 1023, total withdrawal time=9 minutes);  Surgeon: Margo LITTIE Haddock, MD;  Location: [facility];  Service: Endoscopy;  Laterality: N/A;   GASTRIC BYPASS     KIDNEY TRANSPLANT  01/22/2021   KNEE ARTHROSCOPY WITH MEDIAL MENISECTOMY Right 08/22/2012   Procedure: KNEE ARTHROSCOPY WITH PARTIAL MEDIAL MENISECTOMY;  Surgeon: Taft FORBES Margrette, MD;  Location: [facility];  Service: Orthopedics;  Laterality: Right;   PARTIAL NEPHRECTOMY  03/02/2013   left   TENDON REPAIR       Allergies  Allergen Reactions   Gentamicin  Sulfate Other (See Comments)    Per patient blisters blisters    Skelaxin [Metaxalone] Hives and Rash   Tape Rash and Other (See Comments)    Per patient blisters        Family History  Problem Relation Age of Onset   Heart attack Mother    Heart attack Father    Anxiety disorder Sister    Colon cancer Maternal Aunt    Depression Paternal Aunt    Alcohol abuse Maternal Uncle    Colon cancer Maternal Uncle    Depression Son    Anxiety disorder Son      Social History Ms. Macfarlane reports that she has never smoked. She has never been exposed to tobacco smoke. She has never used smokeless tobacco. Ms. Casados reports no history of alcohol use.    Physical Examination Today's Vitals   02/13/24 0926  BP: 130/72  Pulse: 62  Weight: 154 lb 12.8 oz (70.2 kg)  Height: 5' 11 (1.803 m)  PainSc: 0-No pain   Body mass index is 21.59 kg/m.  Gen: resting comfortably, no acute distress HEENT: no scleral icterus, pupils equal round and reactive, no palptable cervical adenopathy,  CV: RRR, 2/6 systolic murmur rusb, no jvd Resp: Clear to auscultation bilaterally GI: abdomen is soft, non-tender, non-distended, normal bowel sounds, no hepatosplenomegaly MSK: extremities are warm, no edema.  Skin: warm, no rash Neuro:  no focal deficits Psych: appropriate affect   Diagnostic Studies  09/2013 Carotid US  1-39% bilateral ICA disease   09/2013 Exercise MPI Overall low risk exercise/Lexiscan  Cardiolite . Patient had limited exercise capacity achieving maximum work load of 7 METS, limited by shortness of breath and hypertensive response. There were no clearly diagnostic ST segment abnormalities. Occasional to frequent PVCs and ventricular couplets were noted early in exercise. There were no sustained arrhythmias. Perfusion imaging shows probable variable soft tissue attenuation, less likely a minor degree of basal lateral ischemia. LVEF is normal at 61% with upper normal chamber volume and no obvious wall motion abnormalities.     Jan 2020 Stress echo [facility] eval NORMAL STRESS TEST.  NO VALVULAR REGURGITATION  NO VALVULAR STENOSIS   Maximum workload of 10.10 METs was achieved during exercise.  RESTING HYPERTENSION - EXAGGERATED RESPONSE     Jan 2020 echo NORMAL LEFT VENTRICULAR SYSTOLIC FUNCTION WITH MODERATE LVH   NORMAL RIGHT VENTRICULAR SYSTOLIC FUNCTION   VALVULAR REGURGITATION: MILD MR, TRIVIAL PR, TRIVIAL TR   VALVULAR STENOSIS: TRIVIAL AS   NO PRIOR STUDY FOR COMPARISON   3D acquisition and reconstructions were performed as part of this   examination to more accurately quantify the effects of heart failure   regardless of ejection fraction. (post-processing on an Independent   workstation).     05/2019 INTERPRETATION ---------------------------------------------------------------    INDETERMINATE. NORMAL  RESTING STUDY WITH NO WALL MOTION ABNORMALITIES AT   REST AND PEAK STRESS.   NORMAL LA PRESSURES WITH NORMAL DIASTOLIC FUNCTION   VALVULAR REGURGITATION: TRIVIAL MR, MILD PR, TRIVIAL TR   VALVULAR STENOSIS: MILD AS   Note: C/W PRIOR ECHO 04/16/18: INCREASED AS GRADIENT   Maximum workload of  8.50 METs was achieved during exercise.   RESTING HYPERTENSION - APPROPRIATE RESPONSE      Assessment and Plan  1. HTN - much improved with dramatic weight loss over the last several years, she is also s/p kidney transplant - no longer requiring bp meds   2. Aortic stenosis -  mild to moderate by echo, the 12/2020 [facility] echo reports AVA 1.3 but no gradients included - has echo pending at [facility] for next month, f/u results  EKG today shows NSR  F/u 1 year      Dorn PHEBE Ross, M.D

## 2024-03-03 ENCOUNTER — Encounter (INDEPENDENT_AMBULATORY_CARE_PROVIDER_SITE_OTHER): Payer: Self-pay | Admitting: *Deleted

## 2024-03-12 ENCOUNTER — Telehealth: Payer: Self-pay | Admitting: *Deleted

## 2024-03-12 NOTE — Telephone Encounter (Signed)
 Ok to schedule. Hold ozempic 7 days ASA 3, room 1,2 Use trilyte and tap water  enema due to kidney disease

## 2024-03-12 NOTE — Telephone Encounter (Signed)
 Procedure: COLONOSCOPY  Estimated body mass index is 20.95 kg/m as calculated from the following:   Height as of 02/13/24: 5' 11 (1.803 m).   Weight as of 02/13/24: 150 lb 3.2 oz (68.1 kg).  Have you had a colonoscopy before?  03/2014 Dr. Harvey  Do you have family history of colon cancer?  no  Do you have a family history of polyps? no  Previous colonoscopy with polyps removed? yes  Do you have a history colorectal cancer?   no  Are you diabetic?  no  Do you have a prosthetic or mechanical heart valve? no  Do you have a pacemaker/defibrillator?   no  Have you had endocarditis/atrial fibrillation?  no  Do you use supplemental oxygen/CPAP?  no  Have you had joint replacement within the last 12 months?  NO  Do you tend to be constipated or have to use laxatives?  NO   Do you have history of alcohol use? If yes, how much and how often.  NO  Do you have history or are you using drugs? If yes, what do are you  using?  NO  Have you ever had a stroke/heart attack?  NO  Have you ever had a heart or other vascular stent placed,?NO  Do you take weight loss medication? YES  female patients,: have you had a hysterectomy? NO                              are you post menopausal?  NO                              do you still have your menstrual cycle? NO    Date of last menstrual period?   Do you take any blood-thinning medications such as: (Plavix, aspirin , Coumadin, Aggrenox, Brilinta, Xarelto, Eliquis, Pradaxa, Savaysa or Effient)? NO  If yes we need the name, milligram, dosage and who is prescribing doctor:               Current Outpatient Medications  Medication Sig Dispense Refill   ALPRAZolam  (XANAX ) 1 MG tablet Take 1 tablet (1 mg total) by mouth 2 (two) times daily as needed for anxiety. 60 tablet 2   BELSOMRA  20 MG TABS Take 1 tablet by mouth at bedtime as needed.     buPROPion  (WELLBUTRIN  XL) 300 MG 24 hr tablet Take 1 tablet (300 mg total) by mouth every morning.  90 tablet 2   levothyroxine (SYNTHROID) 150 MCG tablet Take 150 mcg by mouth every morning.     MAGNESIUM PO Take 1 tablet by mouth daily.     OZEMPIC, 1 MG/DOSE, 4 MG/3ML SOPN Inject 1 mg into the skin once a week.     pantoprazole (PROTONIX) 40 MG tablet Take 40 mg by mouth daily.     PARoxetine  (PAXIL ) 10 MG tablet Take 1 tablet (10 mg total) by mouth daily. Dosage decrease 90 tablet 2   Potassium Chloride  ER 20 MEQ TBCR Take 20 mEq by mouth daily.     QUEtiapine  (SEROQUEL ) 25 MG tablet Take 1 tablet (25 mg total) by mouth at bedtime. 90 tablet 2   sulfamethoxazole-trimethoprim (BACTRIM) 400-80 MG tablet Take 1 tablet by mouth 3 (three) times a week.     tacrolimus  (PROGRAF ) 1 MG capsule Take 1 mg by mouth 2 (two) times daily. 7 capsules (Patient taking differently: 6mg  in the  AM and 5mg  at night)     No current facility-administered medications for this visit.    Allergies[1]      [1]  Allergies Allergen Reactions   Gentamicin  Sulfate Other (See Comments)    Per patient blisters blisters    Skelaxin [Metaxalone] Hives and Rash   Tape Rash and Other (See Comments)    Per patient blisters

## 2024-03-16 ENCOUNTER — Encounter: Payer: Self-pay | Admitting: *Deleted

## 2024-03-16 ENCOUNTER — Other Ambulatory Visit: Payer: Self-pay | Admitting: *Deleted

## 2024-03-16 MED ORDER — PEG 3350-KCL-NA BICARB-NACL 420 G PO SOLR: 4000.0000 mL | Freq: Once | ORAL | 0 refills | Status: AC

## 2024-03-16 NOTE — Telephone Encounter (Signed)
 Pt has been scheduled with Dr.Carver on 04/10/24, instructions mailed and sent via mychart. Prep sent to pharmacy

## 2024-03-16 NOTE — Telephone Encounter (Signed)
Called pt, no answer and VM not set up 

## 2024-03-17 NOTE — Telephone Encounter (Signed)
 Questionnaire from recall, no referral needed

## 2024-04-01 ENCOUNTER — Telehealth: Payer: Self-pay | Admitting: Cardiology

## 2024-04-01 ENCOUNTER — Encounter: Payer: Self-pay | Admitting: Cardiology

## 2024-04-01 NOTE — Telephone Encounter (Signed)
 Error

## 2024-04-07 ENCOUNTER — Ambulatory Visit: Admitting: Podiatry

## 2024-04-08 ENCOUNTER — Other Ambulatory Visit: Payer: Self-pay

## 2024-04-08 ENCOUNTER — Encounter (HOSPITAL_COMMUNITY)
Admission: RE | Admit: 2024-04-08 | Discharge: 2024-04-08 | Disposition: A | Discharge status: Home / Self Care | Source: Ambulatory Visit | Attending: Internal Medicine | Admitting: Internal Medicine

## 2024-04-08 ENCOUNTER — Encounter (HOSPITAL_COMMUNITY): Payer: Self-pay

## 2024-04-08 NOTE — Pre-Procedure Instructions (Signed)
 Attempted pre-op phonecall. Left VM for her to call us  back.

## 2024-04-10 ENCOUNTER — Ambulatory Visit (HOSPITAL_COMMUNITY)
Admission: RE | Admit: 2024-04-10 | Discharge: 2024-04-10 | Disposition: A | Discharge status: Home / Self Care | Attending: Internal Medicine | Admitting: Internal Medicine

## 2024-04-10 ENCOUNTER — Encounter (HOSPITAL_COMMUNITY)
Admission: RE | Disposition: A | Discharge status: Home / Self Care | Payer: Self-pay | Source: Home / Self Care | Attending: Internal Medicine

## 2024-04-10 ENCOUNTER — Encounter (HOSPITAL_COMMUNITY): Payer: Self-pay | Admitting: Internal Medicine

## 2024-04-10 ENCOUNTER — Ambulatory Visit (HOSPITAL_COMMUNITY): Admitting: Anesthesiology

## 2024-04-10 ENCOUNTER — Other Ambulatory Visit: Payer: Self-pay

## 2024-04-10 DIAGNOSIS — E119 Type 2 diabetes mellitus without complications: Secondary | ICD-10-CM | POA: Insufficient documentation

## 2024-04-10 DIAGNOSIS — Z538 Procedure and treatment not carried out for other reasons: Secondary | ICD-10-CM | POA: Insufficient documentation

## 2024-04-10 DIAGNOSIS — Z1211 Encounter for screening for malignant neoplasm of colon: Secondary | ICD-10-CM | POA: Diagnosis present

## 2024-04-10 DIAGNOSIS — Z7985 Long-term (current) use of injectable non-insulin antidiabetic drugs: Secondary | ICD-10-CM | POA: Diagnosis not present

## 2024-04-10 HISTORY — PX: COLONOSCOPY: SHX5424

## 2024-04-10 MED ORDER — PROPOFOL 10 MG/ML IV BOLUS
INTRAVENOUS | Status: DC | PRN
  Administered 2024-04-10: 75 mg via INTRAVENOUS
  Administered 2024-04-10: 50 mg via INTRAVENOUS

## 2024-04-10 MED ORDER — LIDOCAINE HCL 1 % IJ SOLN
INTRAMUSCULAR | Status: DC | PRN
  Administered 2024-04-10: 50 mg via INTRADERMAL

## 2024-04-10 MED ORDER — LACTATED RINGERS IV SOLN: INTRAVENOUS | Status: DC

## 2024-04-10 MED ORDER — LACTATED RINGERS IV SOLN: INTRAVENOUS | Status: DC | PRN

## 2024-04-10 NOTE — Op Note (Signed)
 [facility] Patient Name: April Ward Procedure Date: 04/10/2024 8:13 AM MRN: [id number] Date of Birth: 1960/09/23 Attending MD: Carlin POUR. Cindie , [place], [id number] CSN: [id number] Age: 64 Admit Type: Outpatient Procedure:                Colonoscopy Indications:              Screening for colorectal malignant neoplasm Providers:                Carlin POUR. Cindie, DO, Olam Ada, RN, Daphne Mulch                            Technician, Technician Referring MD:              Medicines:                See the Anesthesia note for documentation of the                            administered medications Complications:            No immediate complications. Estimated Blood Loss:     Estimated blood loss: none. Procedure:                Pre-Anesthesia Assessment:                           - The anesthesia plan was to use monitored                            anesthesia care (MAC).                           After obtaining informed consent, the colonoscope                            was passed under direct vision. Throughout the                            procedure, the patient's blood pressure, pulse, and                            oxygen saturations were monitored continuously. The                            PCF-HQ190L ([id number]) Peds Colon was introduced                            through the anus with the intention of advancing to                            the cecum. The scope was advanced to the descending                            colon before the procedure was aborted. Medications                            were given.  The colonoscopy was performed without                            difficulty. The patient tolerated the procedure                            well. The quality of the bowel preparation was                            evaluated using the BBPS [facility] Bowel Preparation                            Scale) with scores of: Left Colon = 1 (portion of                            mucosa  seen, but other areas not well seen due to                            staining, residual stool and/or opaque liquid). The                            total BBPS score equals 1. The quality of the bowel                            preparation was inadequate. Scope In: 8:28:51 AM Scope Out: 8:30:44 AM Total Procedure Duration: 0 hours 1 minute 53 seconds  Findings:      A large amount of solid stool was found in the sigmoid colon and in the       descending colon, precluding visualization. Procedure aborted. Impression:               - Preparation of the colon was inadequate.                           - Stool in the sigmoid colon and in the descending                            colon.                           - No specimens collected. Moderate Sedation:      Per Anesthesia Care Recommendation:           - Patient has a contact number available for                            emergencies. The signs and symptoms of potential                            delayed complications were discussed with the                            patient. Return to normal activities tomorrow.  Written discharge instructions were provided to the                            patient.                           - Resume previous diet.                           - Continue present medications.                           - Repeat colonoscopy at next available appointment                            (within 3 months) because the bowel preparation was                            poor.                           - Return to GI clinic in 4 weeks to discuss further Procedure Code(s):        --- Professional ---                           G0121, 53, Colorectal cancer screening; colonoscopy                            on individual not meeting criteria for high risk Diagnosis Code(s):        --- Professional ---                           Z12.11, Encounter for screening for malignant                             neoplasm of colon CPT copyright 2022 American Medical Association. All rights reserved. The codes documented in this report are preliminary and upon coder review may  be revised to meet current compliance requirements. Carlin POUR. Cindie, DO Carlin POUR. Cindie, DO 04/10/2024 8:36:51 AM This report has been signed electronically. Number of Addenda: 0

## 2024-04-10 NOTE — Discharge Instructions (Addendum)
" °  Colonoscopy Discharge Instructions  Read the instructions outlined below and refer to this sheet in the next few weeks. These discharge instructions provide you with general information on caring for yourself after you leave the hospital. Your doctor may also give you specific instructions. While your treatment has been planned according to the most current medical practices available, unavoidable complications occasionally occur.   ACTIVITY You may resume your regular activity, but move at a slower pace for the next 24 hours.  Take frequent rest periods for the next 24 hours.  Walking will help get rid of the air and reduce the bloated feeling in your belly (abdomen).  No driving for 24 hours (because of the medicine (anesthesia) used during the test).   Do not sign any important legal documents or operate any machinery for 24 hours (because of the anesthesia used during the test).  NUTRITION Drink plenty of fluids.  You may resume your normal diet as instructed by your doctor.  Begin with a light meal and progress to your normal diet. Heavy or fried foods are harder to digest and may make you feel sick to your stomach (nauseated).  Avoid alcoholic beverages for 24 hours or as instructed.  MEDICATIONS You may resume your normal medications unless your doctor tells you otherwise.  WHAT YOU CAN EXPECT TODAY Some feelings of bloating in the abdomen.  Passage of more gas than usual.  Spotting of blood in your stool or on the toilet paper.  IF YOU HAD POLYPS REMOVED DURING THE COLONOSCOPY: No aspirin  products for 7 days or as instructed.  No alcohol for 7 days or as instructed.  Eat a soft diet for the next 24 hours.  FINDING OUT THE RESULTS OF YOUR TEST Not all test results are available during your visit. If your test results are not back during the visit, make an appointment with your caregiver to find out the results. Do not assume everything is normal if you have not heard from your  caregiver or the medical facility. It is important for you to follow up on all of your test results.  SEEK IMMEDIATE MEDICAL ATTENTION IF: You have more than a spotting of blood in your stool.  Your belly is swollen (abdominal distention).  You are nauseated or vomiting.  You have a temperature over 101.  You have abdominal pain or discomfort that is severe or gets worse throughout the day.   Unfortunately, your colon was not adequately prepped today for colonoscopy.  I did not see any evidence of colon cancer or large polyps, but certainly could have missed smaller polyps due to poor visualization.  Would recommend repeat colonoscopy months with a different colon prep. Follow up in GI office in 4-6 weeks to discuss further.    I hope you have a great rest of your week!  Carlin POUR. Cindie, D.O. Gastroenterology and Hepatology [facility]  "

## 2024-04-10 NOTE — Anesthesia Preprocedure Evaluation (Addendum)
"                                    Anesthesia Evaluation  Patient identified by MRN, date of birth, ID band Patient awake    Reviewed: Allergy & Precautions, H&P , NPO status , Patient's Chart, lab work & pertinent test results, reviewed documented beta blocker date and time   Airway Mallampati: II  TM Distance: >3 FB Neck ROM: full    Dental no notable dental hx.    Pulmonary neg pulmonary ROS   Pulmonary exam normal breath sounds clear to auscultation       Cardiovascular Exercise Tolerance: Good hypertension,  Rhythm:regular Rate:Normal     Neuro/Psych negative neurological ROS  negative psych ROS   GI/Hepatic negative GI ROS, Neg liver ROS,,,  Endo/Other  diabetes    Renal/GU negative Renal ROS  negative genitourinary   Musculoskeletal   Abdominal   Peds  Hematology negative hematology ROS (+)   Anesthesia Other Findings   Reproductive/Obstetrics negative OB ROS                              Anesthesia Physical Anesthesia Plan  ASA: 2  Anesthesia Plan: General   Post-op Pain Management:    Induction:   PONV Risk Score and Plan: Propofol  infusion  Airway Management Planned:   Additional Equipment:   Intra-op Plan:   Post-operative Plan:   Informed Consent: I have reviewed the patients History and Physical, chart, labs and discussed the procedure including the risks, benefits and alternatives for the proposed anesthesia with the patient or authorized representative who has indicated his/her understanding and acceptance.     Dental Advisory Given  Plan Discussed with: CRNA  Anesthesia Plan Comments:          Anesthesia Quick Evaluation  "

## 2024-04-10 NOTE — Transfer of Care (Signed)
 Immediate Anesthesia Transfer of Care Note  Patient: April Ward  Procedure(s) Performed: COLONOSCOPY  Patient Location: Short Stay  Anesthesia Type:General  Level of Consciousness: awake  Airway & Oxygen Therapy: Patient Spontanous Breathing  Post-op Assessment: Report given to RN  Post vital signs: Reviewed and stable  Last Vitals:  Vitals Value Taken Time  BP    Temp    Pulse    Resp    SpO2      Last Pain:  Vitals:   04/10/24 0707  TempSrc: Oral  PainSc: 0-No pain         Complications: No notable events documented.

## 2024-04-10 NOTE — Anesthesia Postprocedure Evaluation (Signed)
"   Anesthesia Post Note  Patient: April Ward  Procedure(s) Performed: COLONOSCOPY  Patient location during evaluation: Short Stay Anesthesia Type: General Level of consciousness: awake and alert Pain management: pain level controlled Vital Signs Assessment: post-procedure vital signs reviewed and stable Respiratory status: spontaneous breathing Cardiovascular status: blood pressure returned to baseline and stable Postop Assessment: no apparent nausea or vomiting Anesthetic complications: no   No notable events documented.   Last Vitals:  Vitals:   04/10/24 0707 04/10/24 0837  BP: (!) 149/80 (!) 119/59  Pulse: (!) 55 (!) 53  Resp: 14 20  Temp: 36.8 C 36.7 C  SpO2: 100% 100%    Last Pain:  Vitals:   04/10/24 0837  TempSrc: Oral  PainSc: 0-No pain                 [name]      "

## 2024-04-10 NOTE — H&P (Signed)
 Primary Care Physician:  Sebastian Jerilyn HERO, FNP Primary Gastroenterologist:  Dr. Cindie  Pre-Procedure History & Physical: HPI:  April Ward is a 64 y.o. female is here for a colonoscopy for colon cancer screening purposes.  Patient denies any family history of colorectal cancer.     Past Medical History:  Diagnosis Date   Anxiety    Arthritis    Depression    Diabetes mellitus without complication (HCC)    GERD (gastroesophageal reflux disease)    Gout    Heart murmur    Hypertension    Renal cancer [facility])    Renal disease 10/2012   Renal insufficiency     Past Surgical History:  Procedure Laterality Date   CESAREAN SECTION  [id number]   CHONDROPLASTY Right 08/22/2012   Procedure: CHONDROPLASTY;  Surgeon: Taft FORBES Minerva, MD;  Location: [facility];  Service: Orthopedics;  Laterality: Right;   COLONOSCOPY WITH PROPOFOL  N/A 03/16/2014   Procedure: COLONOSCOPY WITH PROPOFOL  (at cecum 1023, total withdrawal time=9 minutes);  Surgeon: Margo LITTIE Haddock, MD;  Location: [facility];  Service: Endoscopy;  Laterality: N/A;   GASTRIC BYPASS     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 Authorizing Provider  ALPRAZolam  (XANAX ) 1 MG tablet Take 1 tablet (1 mg total) by mouth 2 (two) times daily as needed for anxiety. 02/13/24  Yes Okey Barnie SAUNDERS, MD  BELSOMRA  20 MG TABS Take 1 tablet by mouth at bedtime as needed.   Yes [provider]  buPROPion  (WELLBUTRIN  XL) 300 MG 24 hr tablet Take 1 tablet (300 mg total) by mouth every morning. 02/13/24 02/12/25 Yes Okey Barnie SAUNDERS, MD  levothyroxine (SYNTHROID) 150 MCG tablet Take 150 mcg by mouth every  morning. 09/18/23  Yes [provider]  MAGNESIUM PO Take 1 tablet by mouth daily.   Yes [provider]  pantoprazole (PROTONIX) 40 MG tablet Take 40 mg by mouth daily. 02/20/24  Yes [provider]  PARoxetine  (PAXIL ) 10 MG tablet Take 1 tablet (10 mg total) by mouth daily. Dosage decrease 02/13/24  Yes Okey Barnie SAUNDERS, MD  Potassium Chloride  ER 20 MEQ TBCR Take 20 mEq by mouth daily.   Yes [provider]  QUEtiapine  (SEROQUEL ) 25 MG tablet Take 1 tablet (25 mg total) by mouth at bedtime. 02/13/24 02/12/25 Yes Okey Barnie SAUNDERS, MD  sulfamethoxazole-trimethoprim (BACTRIM) 400-80 MG tablet Take 1 tablet by mouth 3 (three) times a week. 02/20/24  Yes [provider]  tacrolimus  (PROGRAF ) 1 MG capsule Take 1 mg by mouth 2 (two) times daily. 7 capsules Patient taking differently: 6mg  in the AM and 5mg  at night 09/24/22  Yes [provider]  OZEMPIC, 1 MG/DOSE, 4 MG/3ML SOPN Inject 1 mg into the skin once a week.    [provider]    Allergies as of 03/16/2024 - Review Complete 03/12/2024  Allergen Reaction Noted   Gentamicin  sulfate Other (See Comments) 10/23/2019   Skelaxin [metaxalone] Hives and Rash 07/13/2012   Tape Rash and Other (See Comments) 03/10/2020    Family History  Problem Relation Age of Onset   Heart attack Mother    Heart  attack Father    Anxiety disorder Sister    Colon cancer Maternal Aunt    Depression Paternal Aunt    Alcohol abuse Maternal Uncle    Colon cancer Maternal Uncle    Depression Son    Anxiety disorder Son     Social History   Socioeconomic History   Marital status: Widowed    Spouse name: Not on file   Number of children: Not on file   Years of education: Not on file   Highest education level: Not on file  Occupational History   Occupation: [employer]    Employer: [employer]  Tobacco Use   Smoking status: Never    Passive exposure: Never   Smokeless tobacco:  Never  Vaping Use   Vaping status: Never Used  Substance and Sexual Activity   Alcohol use: No    Alcohol/week: 0.0 standard drinks of alcohol   Drug use: No   Sexual activity: Not on file  Other Topics Concern   Not on file  Social History Narrative   Not on file   Social Drivers of Health   Tobacco Use: Low Risk (04/10/2024)   Patient History    Smoking Tobacco Use: Never    Smokeless Tobacco Use: Never    Passive Exposure: Never  Financial Resource Strain: Not on file  Food Insecurity: Low Risk (01/20/2024)   Received from [facility]   Epic    Within the past 12 months, you worried that your food would run out before you got money to buy more: Never true    Within the past 12 months, the food you bought just didn't last and you didn't have money to get more. : Never true  Transportation Needs: No Transportation Needs (01/20/2024)   Received from [facility]    In the past 12 months, has lack of reliable transportation kept you from medical appointments, meetings, work or from getting things needed for daily living? : No  Physical Activity: Sufficiently Active (12/04/2023)   Received from [facility]   Exercise Vital Sign    On average, how many days per week do you engage in moderate to strenuous exercise (like a brisk walk)?: 3 days    On average, how many minutes do you engage in exercise at this level?: 60 min  Stress: Stress Concern Present (12/04/2023)   Received from [facility] - Occupational Stress Questionnaire    Do you feel stress - tense, restless, nervous, or anxious, or unable to sleep at night because your mind is troubled all the time - these days?: Rather much  Social Connections: Not on file  Intimate Partner Violence: Not At Risk (12/04/2023)   Received from [facility]   Epic    Within the last year, have you been afraid of your partner or ex-partner?: No    Within the last year,  have you been humiliated or emotionally abused in other ways by your partner or ex-partner?: No    Within the last year, have you been kicked, hit, slapped, or otherwise physically hurt by your partner or ex-partner?: No    Within the last year, have you been raped or forced to have any kind of sexual activity by your partner or ex-partner?: No  Depression (PHQ2-9): High Risk (01/15/2023)   Depression (PHQ2-9)    PHQ-2 Score: 16  Alcohol Screen: Not on file  Housing: Low Risk (01/20/2024)  Received from [facility]   Epic    What is your living situation today?: I have a steady place to live    Think about the place you live. Do you have problems with any of the following? Choose all that apply:: None/None on this list  Utilities: Low Risk (12/04/2023)   Received from [facility]   Utilities    Within the past 12 months, have you been unable to get utilities(heat, electricity) when it was really needed?: No  Health Literacy: Low Risk (12/04/2023)   Received from [facility] Literacy    How often do you need to have someone help you when you read instructions, pamphlets, or other written material from your doctor or pharmacy?: Never    Review of Systems: See HPI, otherwise negative ROS  Physical Exam: Vital signs in last 24 hours: Temp:  [98.2 F (36.8 C)] 98.2 F (36.8 C) (01/09 0707) Pulse Rate:  [55] 55 (01/09 0707) Resp:  [14] 14 (01/09 0707) BP: (149)/(80) 149/80 (01/09 0707) SpO2:  [100 %] 100 % (01/09 0707) Weight:  [62.1 kg] 62.1 kg (01/09 0707)   General:   Alert,  Well-developed, well-nourished, pleasant and cooperative in NAD Head:  Normocephalic and atraumatic. Eyes:  Sclera clear, no icterus.   Conjunctiva pink. Ears:  Normal auditory acuity. Nose:  No deformity, discharge,  or lesions. Msk:  Symmetrical without gross deformities. Normal posture. Extremities:  Without clubbing or edema. Neurologic:  Alert and  oriented x4;  grossly normal  neurologically. Skin:  Intact without significant lesions or rashes. Psych:  Alert and cooperative. Normal mood and affect.  Impression/Plan: April Ward is here for a colonoscopy to be performed for colon cancer screening purposes.  The risks of the procedure including infection, bleed, or perforation as well as benefits, limitations, alternatives and imponderables have been reviewed with the patient. Questions have been answered. All parties agreeable.

## 2024-04-13 ENCOUNTER — Encounter (HOSPITAL_COMMUNITY): Payer: Self-pay | Admitting: Internal Medicine

## 2024-04-13 ENCOUNTER — Telehealth (INDEPENDENT_AMBULATORY_CARE_PROVIDER_SITE_OTHER): Payer: Self-pay | Admitting: *Deleted

## 2024-04-13 NOTE — Telephone Encounter (Signed)
Per TCS op note - Repeat colonoscopy at next available appointment (within 3 months) because the bowel preparation was poor.

## 2024-04-15 ENCOUNTER — Other Ambulatory Visit (HOSPITAL_COMMUNITY): Payer: Self-pay | Admitting: Psychiatry

## 2024-04-16 MED ORDER — PEG 3350-KCL-NA BICARB-NACL 420 G PO SOLR: 4000.0000 mL | Freq: Once | ORAL | 0 refills | Status: AC

## 2024-04-16 NOTE — Addendum Note (Signed)
 Addended by: DALLIE LIONEL RAMAN on: 04/16/2024 08:03 AM   Modules accepted: Orders

## 2024-04-16 NOTE — Telephone Encounter (Signed)
 PA on [facility] for TCS: Notification or Prior Authorization is not required for the requested services You are not required to submit a notification/prior authorization based on the information provided.  Decision ID #: [id number]

## 2024-04-16 NOTE — Telephone Encounter (Signed)
 Spoke with patient, scheduled TCS for 05/05/2024 at 10am. Rx sent to pharmacy. Instructions sent to [facility] and mailed.

## 2024-05-01 ENCOUNTER — Telehealth: Payer: Self-pay | Admitting: *Deleted

## 2024-05-01 ENCOUNTER — Encounter (HOSPITAL_COMMUNITY)
Admission: RE | Admit: 2024-05-01 | Discharge: 2024-05-01 | Disposition: A | Source: Ambulatory Visit | Attending: Internal Medicine

## 2024-05-01 ENCOUNTER — Encounter (HOSPITAL_COMMUNITY): Payer: Self-pay

## 2024-05-01 NOTE — Telephone Encounter (Signed)
 Spoke with pt. Rescheduled procedure to 2/17 at 10:15am. Aware will send new instructions to her.

## 2024-05-14 ENCOUNTER — Ambulatory Visit (HOSPITAL_COMMUNITY): Admitting: Psychiatry

## 2024-05-14 ENCOUNTER — Other Ambulatory Visit (HOSPITAL_COMMUNITY)

## 2024-05-19 ENCOUNTER — Encounter (HOSPITAL_COMMUNITY): Admission: RE | Payer: Self-pay | Source: Home / Self Care

## 2024-05-19 ENCOUNTER — Ambulatory Visit (HOSPITAL_COMMUNITY): Admission: RE | Admit: 2024-05-19 | Source: Home / Self Care | Admitting: Internal Medicine
